# Patient Record
Sex: Female | Born: 1935 | Race: Black or African American | Hispanic: No | Smoking: Never smoker
Health system: Southern US, Community
[De-identification: ages and names within clinical notes are randomized; demographics above are authoritative.]

## PROBLEM LIST (undated history)

## (undated) DIAGNOSIS — I10 Essential (primary) hypertension: Secondary | ICD-10-CM

## (undated) DIAGNOSIS — M1990 Unspecified osteoarthritis, unspecified site: Secondary | ICD-10-CM

## (undated) DIAGNOSIS — E119 Type 2 diabetes mellitus without complications: Secondary | ICD-10-CM

## (undated) DIAGNOSIS — E876 Hypokalemia: Secondary | ICD-10-CM

## (undated) HISTORY — PX: ABDOMINAL HYSTERECTOMY: SHX81

## (undated) HISTORY — PX: COLONOSCOPY: SHX174

## (undated) SURGERY — EXCISION, CHALAZION
Anesthesia: LOCAL | Laterality: Left

---

## 2011-11-06 ENCOUNTER — Encounter (HOSPITAL_BASED_OUTPATIENT_CLINIC_OR_DEPARTMENT_OTHER)
Admission: RE | Disposition: A | Discharge status: Home / Self Care | Payer: Self-pay | Source: Ambulatory Visit | Attending: Ophthalmology

## 2011-11-06 ENCOUNTER — Ambulatory Visit (HOSPITAL_BASED_OUTPATIENT_CLINIC_OR_DEPARTMENT_OTHER)
Admission: RE | Admit: 2011-11-06 | Discharge: 2011-11-06 | Disposition: A | Discharge status: Home / Self Care | Payer: Medicare Other | Source: Ambulatory Visit | Attending: Ophthalmology | Admitting: Ophthalmology

## 2011-11-06 ENCOUNTER — Encounter (HOSPITAL_BASED_OUTPATIENT_CLINIC_OR_DEPARTMENT_OTHER): Payer: Self-pay

## 2011-11-06 ENCOUNTER — Other Ambulatory Visit: Payer: Self-pay | Admitting: Ophthalmology

## 2011-11-06 DIAGNOSIS — H0019 Chalazion unspecified eye, unspecified eyelid: Secondary | ICD-10-CM | POA: Insufficient documentation

## 2011-11-06 HISTORY — PX: CHALAZION EXCISION: SHX213

## 2011-11-06 SURGERY — MINOR EXCISION OF CHALAZION
Anesthesia: LOCAL | Site: Eye | Laterality: Left | Wound class: Clean

## 2011-11-06 SURGERY — EXCISION, CHALAZION
Anesthesia: LOCAL | Laterality: Left

## 2011-11-06 MED ORDER — LIDOCAINE HCL 1 % IJ SOLN
INTRAMUSCULAR | Status: DC | PRN
  Administered 2011-11-06: 5 mL

## 2011-11-06 MED ORDER — BACITRACIN-POLYMYXIN B 500-10000 UNIT/GM OP OINT
TOPICAL_OINTMENT | OPHTHALMIC | Status: DC | PRN
  Administered 2011-11-06: 1 via OPHTHALMIC

## 2011-11-06 MED ORDER — BALANCED SALT IO SOLN
INTRAOCULAR | Status: DC | PRN
  Administered 2011-11-06: 5 mL via INTRAOCULAR

## 2011-11-06 SURGICAL SUPPLY — 16 items
APPLICATOR COTTON TIP 6IN STRL (MISCELLANEOUS) ×2 IMPLANT
BLADE SURG 11 STRL SS (BLADE) ×2 IMPLANT
CAUTERY EYE LOW TEMP 1300F FIN (OPHTHALMIC RELATED) IMPLANT
CLOTH BEACON ORANGE TIMEOUT ST (SAFETY) IMPLANT
GAUZE SPONGE 4X4 12PLY STRL LF (GAUZE/BANDAGES/DRESSINGS) ×4 IMPLANT
GLOVE ECLIPSE 6.5 STRL STRAW (GLOVE) ×2 IMPLANT
GLOVE ECLIPSE 7.0 STRL STRAW (GLOVE) ×2 IMPLANT
MARKER SKIN DUAL TIP RULER LAB (MISCELLANEOUS) ×2 IMPLANT
NEEDLE HYPO 25X5/8 SAFETYGLIDE (NEEDLE) ×2 IMPLANT
NEEDLE HYPO 30X.5 LL (NEEDLE) ×2 IMPLANT
PAD ALCOHOL SWAB (MISCELLANEOUS) ×2 IMPLANT
PAD EYE OVAL STERILE LF (GAUZE/BANDAGES/DRESSINGS) ×6 IMPLANT
SUT SILK 6 0 P 1 (SUTURE) ×2 IMPLANT
SWABSTICK POVIDONE IODINE SNGL (MISCELLANEOUS) ×4 IMPLANT
SYR CONTROL 10ML LL (SYRINGE) ×2 IMPLANT
TOWEL OR 17X24 6PK STRL BLUE (TOWEL DISPOSABLE) ×2 IMPLANT

## 2011-11-06 NOTE — H&P (Signed)
  78 yo female with multiple chalazia  Upper lid left eye.  Lesion has not responded to conservative treatment over 4 months.  Admitted for chalazion excission

## 2011-11-06 NOTE — Brief Op Note (Signed)
11/06/2011  7:31 AM  PATIENT:  Alicia Vang  78 y.o. female  PRE-OPERATIVE DIAGNOSIS:  chalazion upper lid left eye  POST-OPERATIVE DIAGNOSIS:  * No post-op diagnosis entered *  PROCEDURE:  Procedure(s): MINOR EXCISION OF CHALAZION  SURGEON:  Surgeon(s): [employer].  PHYSICIAN ASSISTANT:   ASSISTANTS: none   ANESTHESIA:   local  EBL:     BLOOD ADMINISTERED:none  DRAINS: none  LOCAL MEDICATIONS USED:  XYLOCAINE CC  SPECIMEN:  No Specimen  DISPOSITION OF SPECIMEN:  N/A  COUNTS:  YES  TOURNIQUET:  * No tourniquets in log *  DICTATION: .Note written in EPIC  PLAN OF CARE: Discharge to home after PACU  PATIENT DISPOSITION:  Short Stay   Delay start of Pharmacological VTE agent (>24hrs) due to surgical blood loss or risk of bleeding:  {YES/NO/NOT APPLICABLE:20182                                        [facility]                                 Minor Surgery Room Physician's Record                                  [ x ] Minor Procedure    [  ]Yag Laser           11/06/2011   Brief History/ Finding   78 yo female for excision mass upper lid left eye Diagnosis;  Chalazion upper lid left eye  Local Anesthetic : Local  Procedure:  Chalazion Excision upper lid left eye    Specimen Removed: (Type & Number)   Disposition:  [ ]  Pathology, Routine                        [ x ] Other     [  ] Disposed of  Condition of Patient Post Procedure: [x  ]  Stable  [  ] Other     Discharge Instructions:  [ x ]  Diet , regular   [  ] Other          Activity: [ x ] No restrictions  [  ] Other          Medication: tylenol          Follow-up: 1 week          Other: none  Time:         No name on file.

## 2011-11-06 NOTE — Op Note (Signed)
Remove patch tomorrow.  Then resume drops to left eye 4 x day.  Call office for appointment 1 week. Remove patch tomorrow.  Then resume drops to left eye 4 x day.  Call office for appointment 1 week.

## 2011-11-07 ENCOUNTER — Ambulatory Visit (HOSPITAL_BASED_OUTPATIENT_CLINIC_OR_DEPARTMENT_OTHER): Admit: 2011-11-07 | Payer: Self-pay | Admitting: Ophthalmology

## 2011-11-07 ENCOUNTER — Encounter (HOSPITAL_BASED_OUTPATIENT_CLINIC_OR_DEPARTMENT_OTHER): Payer: Self-pay | Admitting: Ophthalmology

## 2011-11-07 NOTE — H&P (Signed)
78 yo female with with a know on the upper lid os x 4 months.  Seems to be getting larger.  At times painful.  Admitted for chalazion upper lid left eye

## 2012-01-03 ENCOUNTER — Ambulatory Visit (HOSPITAL_BASED_OUTPATIENT_CLINIC_OR_DEPARTMENT_OTHER): Admission: RE | Admit: 2012-01-03 | Payer: Medicare Other | Source: Ambulatory Visit | Admitting: Ophthalmology

## 2012-01-03 ENCOUNTER — Encounter (HOSPITAL_BASED_OUTPATIENT_CLINIC_OR_DEPARTMENT_OTHER): Admission: RE | Payer: Self-pay | Source: Ambulatory Visit

## 2012-01-03 SURGERY — MINOR EXCISION OF CHALAZION
Anesthesia: LOCAL | Laterality: Left

## 2012-02-02 ENCOUNTER — Encounter (HOSPITAL_BASED_OUTPATIENT_CLINIC_OR_DEPARTMENT_OTHER)
Admission: RE | Disposition: A | Discharge status: Home / Self Care | Payer: Self-pay | Source: Ambulatory Visit | Attending: Ophthalmology

## 2012-02-02 ENCOUNTER — Other Ambulatory Visit: Payer: Self-pay | Admitting: Ophthalmology

## 2012-02-02 ENCOUNTER — Ambulatory Visit (HOSPITAL_BASED_OUTPATIENT_CLINIC_OR_DEPARTMENT_OTHER)
Admission: RE | Admit: 2012-02-02 | Discharge: 2012-02-02 | Disposition: A | Discharge status: Home / Self Care | Payer: Medicare Other | Source: Ambulatory Visit | Attending: Ophthalmology | Admitting: Ophthalmology

## 2012-02-02 ENCOUNTER — Encounter: Payer: Self-pay | Admitting: Ophthalmology

## 2012-02-02 DIAGNOSIS — H0019 Chalazion unspecified eye, unspecified eyelid: Secondary | ICD-10-CM | POA: Insufficient documentation

## 2012-02-02 HISTORY — PX: CHALAZION EXCISION: SHX213

## 2012-02-02 SURGERY — MINOR EXCISION OF CHALAZION
Anesthesia: LOCAL | Site: Eye | Laterality: Left | Wound class: Clean

## 2012-02-02 MED ORDER — BACITRACIN-POLYMYXIN B 500-10000 UNIT/GM OP OINT
TOPICAL_OINTMENT | OPHTHALMIC | Status: DC | PRN
  Administered 2012-02-02: 1 via OPHTHALMIC

## 2012-02-02 MED ORDER — LIDOCAINE HCL 1 % IJ SOLN
INTRAMUSCULAR | Status: DC | PRN
  Administered 2012-02-02: 4 mL

## 2012-02-02 SURGICAL SUPPLY — 18 items
APPLICATOR COTTON TIP 6IN STRL (MISCELLANEOUS) ×2 IMPLANT
BLADE SURG 11 STRL SS (BLADE) ×2 IMPLANT
CAUTERY EYE LOW TEMP 1300F FIN (OPHTHALMIC RELATED) IMPLANT
CLOTH BEACON ORANGE TIMEOUT ST (SAFETY) ×2 IMPLANT
GAUZE SPONGE 4X4 12PLY STRL LF (GAUZE/BANDAGES/DRESSINGS) ×2 IMPLANT
GLOVE BIOGEL PI IND STRL 8 (GLOVE) ×2 IMPLANT
GLOVE BIOGEL PI INDICATOR 8 (GLOVE) ×2
GLOVE ECLIPSE 7.0 STRL STRAW (GLOVE) ×2 IMPLANT
MARKER SKIN DUAL TIP RULER LAB (MISCELLANEOUS) ×2 IMPLANT
NDL SAFETY ECLIPSE 18X1.5 (NEEDLE) ×1 IMPLANT
NEEDLE HYPO 18GX1.5 SHARP (NEEDLE) ×1
NEEDLE HYPO 25X5/8 SAFETYGLIDE (NEEDLE) ×2 IMPLANT
PAD ALCOHOL SWAB (MISCELLANEOUS) ×2 IMPLANT
PAD EYE OVAL STERILE LF (GAUZE/BANDAGES/DRESSINGS) ×6 IMPLANT
SUT SILK 6 0 P 1 (SUTURE) ×2 IMPLANT
SWABSTICK POVIDONE IODINE SNGL (MISCELLANEOUS) ×4 IMPLANT
SYR CONTROL 10ML LL (SYRINGE) ×2 IMPLANT
TOWEL OR 17X24 6PK STRL BLUE (TOWEL DISPOSABLE) ×2 IMPLANT

## 2012-02-02 NOTE — Brief Op Note (Signed)
02/02/2012  12:11 PM  PATIENT:  Alicia Vang  78 y.o. female  PRE-OPERATIVE DIAGNOSIS:  chalazion  POST-OPERATIVE DIAGNOSIS:  *S No post-op diagnosis entered *  PROCEDURE:  Procedure(s) (LRB): MINOR EXCISION OF CHALAZION (Left)  SURGEON:  Surgeon(s) and Role:    Vita Erm., MD - Primary  PHYSICIAN ASSISTANT:   ASSISTANTS: none   ANESTHESIA:   local  EBL:     BLOOD ADMINISTERED:none  DRAINS: none   LOCAL MEDICATIONS USED:  XYLOCAINE   SPECIMEN:  No Specimen  DISPOSITION OF SPECIMEN:  N/A  COUNTS:  YES  TOURNIQUET:  * No tourniquets in log *  DICTATION: .Note written in EPIC  PLAN OF CARE: Discharge to home after PACU  PATIENT DISPOSITION:  Short Stay   Delay start of Pharmacological VTE agent (>24hrs) due to surgical blood loss or risk of bleeding: not applicable

## 2012-02-02 NOTE — H&P (Signed)
  78 yo female has knot on upper  Lid left eye.  Has had similar lesion removed over the past 2 months.  Admitted today for re-excision chalazion upper lid left eye

## 2012-02-02 NOTE — Discharge Instructions (Signed)
Remove patch tomorrow.  Then resume drops to leftt eye 4 x day.  Call office for appointment 1 week. Remove patch tomorrow.  Then resume drops to leftt eye 4 x day.  Call office for appointment 1 week.

## 2012-02-05 ENCOUNTER — Encounter (HOSPITAL_BASED_OUTPATIENT_CLINIC_OR_DEPARTMENT_OTHER): Payer: Self-pay | Admitting: Ophthalmology

## 2012-03-04 NOTE — Op Note (Signed)
NAMEKORAL, THADEN NO.:  [id number]  MEDICAL RECORD NO.:  [id number]  LOCATION:                                 FACILITY:  PHYSICIAN:  Salley Scarlet., M.D.DATE OF BIRTH:  Aug 10, 1927  DATE OF PROCEDURE: DATE OF DISCHARGE:                              OPERATIVE REPORT   PREOPERATIVE DIAGNOSIS:  Chalazion, upper lid, left eye.  POSTOPERATIVE DIAGNOSIS:  Chalazion, upper lid, left eye.  OPERATION:  Chalazion excision, upper lid, left eye.  ANESTHESIA:  Local using Xylocaine 1%.  JUSTIFICATION OF PROCEDURE:  This is an 78 year old lady who had a chalazion excised from the upper lid of the right eye several weeks ago. However, there is recurrence of a lesion slightly adjacent to the previous one.  She has requested that the lesion be excised after conservative treatment was ineffective.  She is admitted at this time for chalazion excision.  PROCEDURE:  The patient was prepped and draped in usual manner.  The lid speculum was inserted under the upper and lower lid of the left eye.  A cruciate incision was made in the tarsus over the lesion.  The lesion was curetted using chalazion curette.  The sac was excised in total using sharp and blunt dissection.  Polysporin ophthalmic ointment was applied to the eye after the clamp was removed.  A pressure patch was applied.  The patient tolerated the procedure well and discharged to postanesthesia recovery room in satisfactory condition with instructions to see me in the office in 1 week.  DISCHARGE DIAGNOSIS:  Chalazion, upper lid, left eye.     Salley Scarlet., M.D.     TB/MEDQ  D:  03/01/2012  T:  03/01/2012  Job:  [id number]

## 2013-03-19 ENCOUNTER — Encounter (HOSPITAL_COMMUNITY): Payer: Self-pay | Admitting: Pharmacy Technician

## 2013-03-28 ENCOUNTER — Ambulatory Visit (HOSPITAL_BASED_OUTPATIENT_CLINIC_OR_DEPARTMENT_OTHER)
Admission: RE | Admit: 2013-03-28 | Discharge: 2013-03-28 | Disposition: A | Discharge status: Home / Self Care | Payer: Medicare Other | Source: Ambulatory Visit | Attending: Ophthalmology | Admitting: Ophthalmology

## 2013-03-28 ENCOUNTER — Encounter (HOSPITAL_BASED_OUTPATIENT_CLINIC_OR_DEPARTMENT_OTHER)
Admission: RE | Disposition: A | Discharge status: Home / Self Care | Payer: Self-pay | Source: Ambulatory Visit | Attending: Ophthalmology

## 2013-03-28 ENCOUNTER — Other Ambulatory Visit: Payer: Self-pay | Admitting: Ophthalmology

## 2013-03-28 ENCOUNTER — Encounter (HOSPITAL_COMMUNITY)
Admission: RE | Disposition: A | Discharge status: Home / Self Care | Payer: Self-pay | Source: Ambulatory Visit | Attending: Ophthalmology

## 2013-03-28 ENCOUNTER — Ambulatory Visit (HOSPITAL_COMMUNITY)
Admission: RE | Admit: 2013-03-28 | Discharge: 2013-03-28 | Disposition: A | Discharge status: Home / Self Care | Payer: Medicare Other | Source: Ambulatory Visit | Attending: Ophthalmology | Admitting: Ophthalmology

## 2013-03-28 DIAGNOSIS — E119 Type 2 diabetes mellitus without complications: Secondary | ICD-10-CM | POA: Insufficient documentation

## 2013-03-28 DIAGNOSIS — H01009 Unspecified blepharitis unspecified eye, unspecified eyelid: Secondary | ICD-10-CM | POA: Insufficient documentation

## 2013-03-28 DIAGNOSIS — H0019 Chalazion unspecified eye, unspecified eyelid: Secondary | ICD-10-CM | POA: Insufficient documentation

## 2013-03-28 DIAGNOSIS — I10 Essential (primary) hypertension: Secondary | ICD-10-CM | POA: Insufficient documentation

## 2013-03-28 DIAGNOSIS — Z79899 Other long term (current) drug therapy: Secondary | ICD-10-CM | POA: Insufficient documentation

## 2013-03-28 DIAGNOSIS — H409 Unspecified glaucoma: Secondary | ICD-10-CM | POA: Insufficient documentation

## 2013-03-28 HISTORY — PX: CHALAZION EXCISION: SHX213

## 2013-03-28 SURGERY — MINOR EXCISION OF CHALAZION
Anesthesia: LOCAL | Site: Eye | Laterality: Right | Wound class: Clean Contaminated

## 2013-03-28 SURGERY — MINOR EXCISION OF CHALAZION
Anesthesia: LOCAL | Site: Eye | Laterality: Right

## 2013-03-28 MED ORDER — LIDOCAINE HCL 1 % IJ SOLN
INTRAMUSCULAR | Status: DC | PRN
  Administered 2013-03-28: 8 mL

## 2013-03-28 MED ORDER — PHENYLEPHRINE HCL 2.5 % OP SOLN
OPHTHALMIC | Status: DC | PRN
  Administered 2013-03-28: 5 [drp] via OPHTHALMIC

## 2013-03-28 SURGICAL SUPPLY — 18 items
APPLICATOR COTTON TIP 6IN STRL (MISCELLANEOUS) ×2 IMPLANT
BLADE SURG 11 STRL SS (BLADE) ×2 IMPLANT
CAUTERY EYE LOW TEMP 1300F FIN (OPHTHALMIC RELATED) IMPLANT
CLOTH BEACON ORANGE TIMEOUT ST (SAFETY) ×2 IMPLANT
GAUZE SPONGE 4X4 12PLY STRL LF (GAUZE/BANDAGES/DRESSINGS) ×2 IMPLANT
GLOVE BIOGEL M STRL SZ7.5 (GLOVE) ×4 IMPLANT
GLOVE ECLIPSE 7.0 STRL STRAW (GLOVE) ×2 IMPLANT
MARKER SKIN DUAL TIP RULER LAB (MISCELLANEOUS) ×2 IMPLANT
NDL SAFETY ECLIPSE 18X1.5 (NEEDLE) ×1 IMPLANT
NEEDLE HYPO 18GX1.5 SHARP (NEEDLE) ×1
NEEDLE HYPO 25X5/8 SAFETYGLIDE (NEEDLE) IMPLANT
PAD ALCOHOL SWAB (MISCELLANEOUS) IMPLANT
PAD EYE OVAL STERILE LF (GAUZE/BANDAGES/DRESSINGS) ×4 IMPLANT
SPONGE GAUZE 4X4 12PLY (GAUZE/BANDAGES/DRESSINGS) ×4 IMPLANT
SUT SILK 6 0 P 1 (SUTURE) IMPLANT
SWABSTICK POVIDONE IODINE SNGL (MISCELLANEOUS) ×6 IMPLANT
SYR CONTROL 10ML LL (SYRINGE) ×4 IMPLANT
TOWEL OR 17X24 6PK STRL BLUE (TOWEL DISPOSABLE) ×16 IMPLANT

## 2013-03-28 SURGICAL SUPPLY — 1 items: PAD EYE OVAL STERILE LF (GAUZE/BANDAGES/DRESSINGS) ×16 IMPLANT

## 2013-03-28 NOTE — H&P (Signed)
  79 yo female has recurrent chalazia upper and lower lids right eye.  They have not responded to conservative treatment with antibiotics. Patient admitted for multiple chalazia excision.

## 2013-03-28 NOTE — H&P (Signed)
H & P submitted to medical records to be scanned into EHR.

## 2013-03-28 NOTE — H&P (Signed)
79 yo female with multiple recurrent chalazia upper and lower lids right eye.  Lesions have responded to the acute infections with antibiotics, but they have not completely resolved.  Admitted for multiple chalaiza excision upper and lower lids right eye.

## 2013-03-28 NOTE — Brief Op Note (Signed)
03/28/2013  8:08 AM  PATIENT:  Alicia Vang  79 y.o. female  PRE-OPERATIVE DIAGNOSIS:  chalazion right eye   POST-OPERATIVE DIAGNOSIS:  *Same *  PROCEDURE:  Procedure(s): MINOR EXCISION OF CHALAZION UPPER AND LOWER RIGHT EYE   (Right)  SURGEON:  Surgeon(s) and Role:    Vita Erm., MD - Primary  PHYSICIAN ASSISTANT:   ASSISTANTS: none   ANESTHESIA:   local  EBL:     BLOOD ADMINISTERED:none  DRAINS: none   LOCAL MEDICATIONS USED:  XYLOCAINE   SPECIMEN:  Excision  DISPOSITION OF SPECIMEN:  PATHOLOGY  COUNTS:  YES  TOURNIQUET:  * No tourniquets in log *  DICTATION: .Other Dictation: Dictation Number [id number]  PLAN OF CARE: Discharge to home after PACU  PATIENT DISPOSITION:  Short Stay   Delay start of Pharmacological VTE agent (>24hrs) due to surgical blood loss or risk of bleeding: not applicable

## 2013-03-30 NOTE — Op Note (Signed)
NAMEGARRY, Alicia Vang NO.:  [id number]  MEDICAL RECORD NO.:  [id number]  LOCATION:  [facility]                         FACILITY:  [facility]  PHYSICIAN:  Salley Scarlet., M.D.DATE OF BIRTH:  12/25/1926  DATE OF PROCEDURE:  03/29/2013 DATE OF DISCHARGE:  03/28/2013                              OPERATIVE REPORT   PREOPERATIVE DIAGNOSIS:  Multiple chalazia, upper and lower lid, right eye.  POSTOPERATIVE DIAGNOSIS:  Multiple chalazia, upper and lower lid, right eye.  OPERATION:  Excision of multiple chalazia, upper and lower lid, right eye.  PROCEDURE:  The patient was taken to operating room and was prepped and draped in usual manner.  The upper and lower lids of the right eye were infiltrated with several mL of Xylocaine.  Attention was applied first to the upper lid.  Chalazion clamp was applied over the lesion which was located centrally.  A cruciate incision was made in the tarsus over the lesion.  Multiple lesions were curetted using chalazion curette.  The sac was excised in toto using sharp and blunt dissection.  The chalazion clamp was applied over the lesion of the lower lid of the right eye.  A cruciate incision was made in the tarsus over the lesion and lesion was curetted using chalazion curette.  The sac was excised in toto using sharp dissection.  A pressure patch was applied after applying Polysporin ophthalmic ointment and the patient was discharged to the post anesthesia recovery room in satisfactory condition.  In the postop area, she continued to bleed profusely soaking 2 different pressure patches.  Essentially,we could not stop the bleeding.  The patient was taken back to the operating room, where the small artery was isolated and curetted so as to achieve hemostasis.  A pressure patch was applied after applying Polysporin ophthalmic ointment and the patient was returned again to the post anesthesia recovery room.  She remained there for an  hour or so.  She was discharged in satisfactory condition with instructions to remove the patch tomorrow, to resume her drops 4 times a day, and to see me in office in 1 week, unless she develops more bleeding at which time she is to call me.  DISCHARGE DIAGNOSIS:  Multiple chalazia, upper and lower lid, right eye.     Salley Scarlet., M.D.     TB/MEDQ  D:  03/29/2013  T:  03/29/2013  Job:  [id number]

## 2013-03-31 ENCOUNTER — Encounter (HOSPITAL_BASED_OUTPATIENT_CLINIC_OR_DEPARTMENT_OTHER): Payer: Self-pay | Admitting: Ophthalmology

## 2013-03-31 LAB — WOUND CULTURE: Culture: NO GROWTH

## 2013-05-09 ENCOUNTER — Encounter (HOSPITAL_BASED_OUTPATIENT_CLINIC_OR_DEPARTMENT_OTHER): Payer: Self-pay

## 2013-05-09 ENCOUNTER — Ambulatory Visit (HOSPITAL_BASED_OUTPATIENT_CLINIC_OR_DEPARTMENT_OTHER): Admit: 2013-05-09 | Payer: Self-pay | Admitting: Ophthalmology

## 2013-05-09 SURGERY — EXCISION, CHALAZION
Anesthesia: Choice | Laterality: Right

## 2016-02-18 DIAGNOSIS — I10 Essential (primary) hypertension: Secondary | ICD-10-CM | POA: Diagnosis present

## 2016-02-19 DIAGNOSIS — E119 Type 2 diabetes mellitus without complications: Secondary | ICD-10-CM

## 2017-07-04 ENCOUNTER — Other Ambulatory Visit: Payer: Self-pay | Admitting: Orthopedic Surgery

## 2017-08-10 ENCOUNTER — Other Ambulatory Visit: Payer: Self-pay | Admitting: Orthopedic Surgery

## 2017-08-13 ENCOUNTER — Inpatient Hospital Stay (HOSPITAL_COMMUNITY): Payer: Medicare Other | Admitting: Emergency Medicine

## 2017-08-13 ENCOUNTER — Other Ambulatory Visit (HOSPITAL_COMMUNITY): Payer: Medicare Other

## 2017-08-13 ENCOUNTER — Encounter (HOSPITAL_COMMUNITY): Payer: Self-pay

## 2017-08-13 ENCOUNTER — Ambulatory Visit (HOSPITAL_COMMUNITY)
Admission: RE | Admit: 2017-08-13 | Discharge: 2017-08-13 | Disposition: A | Discharge status: Home / Self Care | Payer: Medicare Other | Source: Ambulatory Visit | Attending: Orthopedic Surgery | Admitting: Orthopedic Surgery

## 2017-08-13 ENCOUNTER — Encounter (HOSPITAL_COMMUNITY)
Admission: RE | Admit: 2017-08-13 | Discharge: 2017-08-13 | Disposition: A | Discharge status: Home / Self Care | Payer: Medicare Other | Source: Ambulatory Visit | Attending: Orthopedic Surgery | Admitting: Orthopedic Surgery

## 2017-08-13 DIAGNOSIS — Z01818 Encounter for other preprocedural examination: Secondary | ICD-10-CM

## 2017-08-13 DIAGNOSIS — Z7984 Long term (current) use of oral hypoglycemic drugs: Secondary | ICD-10-CM | POA: Insufficient documentation

## 2017-08-13 DIAGNOSIS — I10 Essential (primary) hypertension: Secondary | ICD-10-CM | POA: Insufficient documentation

## 2017-08-13 DIAGNOSIS — Z01812 Encounter for preprocedural laboratory examination: Secondary | ICD-10-CM | POA: Insufficient documentation

## 2017-08-13 DIAGNOSIS — Z79899 Other long term (current) drug therapy: Secondary | ICD-10-CM | POA: Diagnosis not present

## 2017-08-13 DIAGNOSIS — E119 Type 2 diabetes mellitus without complications: Secondary | ICD-10-CM | POA: Insufficient documentation

## 2017-08-13 DIAGNOSIS — Z0183 Encounter for blood typing: Secondary | ICD-10-CM | POA: Insufficient documentation

## 2017-08-13 HISTORY — DX: Type 2 diabetes mellitus without complications: E11.9

## 2017-08-13 HISTORY — DX: Essential (primary) hypertension: I10

## 2017-08-13 HISTORY — DX: Unspecified osteoarthritis, unspecified site: M19.90

## 2017-08-13 LAB — URINALYSIS, ROUTINE W REFLEX MICROSCOPIC
BACTERIA UA: NONE SEEN
Bilirubin Urine: NEGATIVE
Glucose, UA: 50 mg/dL — AB
Hgb urine dipstick: NEGATIVE
Ketones, ur: NEGATIVE mg/dL
Leukocytes, UA: NEGATIVE
Nitrite: NEGATIVE
PROTEIN: 30 mg/dL — AB
SQUAMOUS EPITHELIAL / LPF: NONE SEEN
Specific Gravity, Urine: 1.016 (ref 1.005–1.030)
pH: 5 (ref 5.0–8.0)

## 2017-08-13 LAB — CBC WITH DIFFERENTIAL/PLATELET
BASOS ABS: 0 10*3/uL (ref 0.0–0.1)
Basophils Relative: 0 %
Eosinophils Absolute: 0 10*3/uL (ref 0.0–0.7)
Eosinophils Relative: 1 %
HEMATOCRIT: 39.5 % (ref 36.0–46.0)
Hemoglobin: 12.9 g/dL (ref 12.0–15.0)
LYMPHS ABS: 0.7 10*3/uL (ref 0.7–4.0)
LYMPHS PCT: 16 %
MCH: 28.6 pg (ref 26.0–34.0)
MCHC: 32.7 g/dL (ref 30.0–36.0)
MCV: 87.6 fL (ref 78.0–100.0)
MONO ABS: 0.5 10*3/uL (ref 0.1–1.0)
Monocytes Relative: 10 %
NEUTROS ABS: 3.4 10*3/uL (ref 1.7–7.7)
Neutrophils Relative %: 73 %
PLATELETS: 227 10*3/uL (ref 150–400)
RBC: 4.51 MIL/uL (ref 3.87–5.11)
RDW: 13.6 % (ref 11.5–15.5)
WBC: 4.7 10*3/uL (ref 4.0–10.5)

## 2017-08-13 LAB — BASIC METABOLIC PANEL
ANION GAP: 10 (ref 5–15)
BUN: 14 mg/dL (ref 6–20)
CHLORIDE: 99 mmol/L — AB (ref 101–111)
CO2: 29 mmol/L (ref 22–32)
Calcium: 9.7 mg/dL (ref 8.9–10.3)
Creatinine, Ser: 0.81 mg/dL (ref 0.44–1.00)
GFR calc Af Amer: >60 mL/min (ref >60)
GLUCOSE: 245 mg/dL — AB (ref 65–99)
POTASSIUM: 2.9 mmol/L — AB (ref 3.5–5.1)
Sodium: 138 mmol/L (ref 135–145)

## 2017-08-13 LAB — HEMOGLOBIN A1C
HEMOGLOBIN A1C: 7.1 % — AB (ref 4.8–5.6)
MEAN PLASMA GLUCOSE: 157.07 mg/dL

## 2017-08-13 LAB — GLUCOSE, CAPILLARY: Glucose-Capillary: 228 mg/dL — ABNORMAL HIGH (ref 65–99)

## 2017-08-13 LAB — SURGICAL PCR SCREEN
MRSA, PCR: NEGATIVE
STAPHYLOCOCCUS AUREUS: NEGATIVE

## 2017-08-13 LAB — TYPE AND SCREEN
ABO/RH(D): O POS
Antibody Screen: NEGATIVE

## 2017-08-13 LAB — APTT: APTT: 32 s (ref 24–36)

## 2017-08-13 LAB — ABO/RH: ABO/RH(D): O POS

## 2017-08-13 LAB — PROTIME-INR
INR: 1.05
PROTHROMBIN TIME: 13.6 s (ref 11.4–15.2)

## 2017-08-13 IMAGING — CR DG CHEST 2V
2 series · 2 of 2 positions shown · non-contrast
Comparison: None.

CLINICAL DATA: Preoperative exam prior to total knee replacement.

EXAM:
CHEST  2 VIEW

[w chest lat]
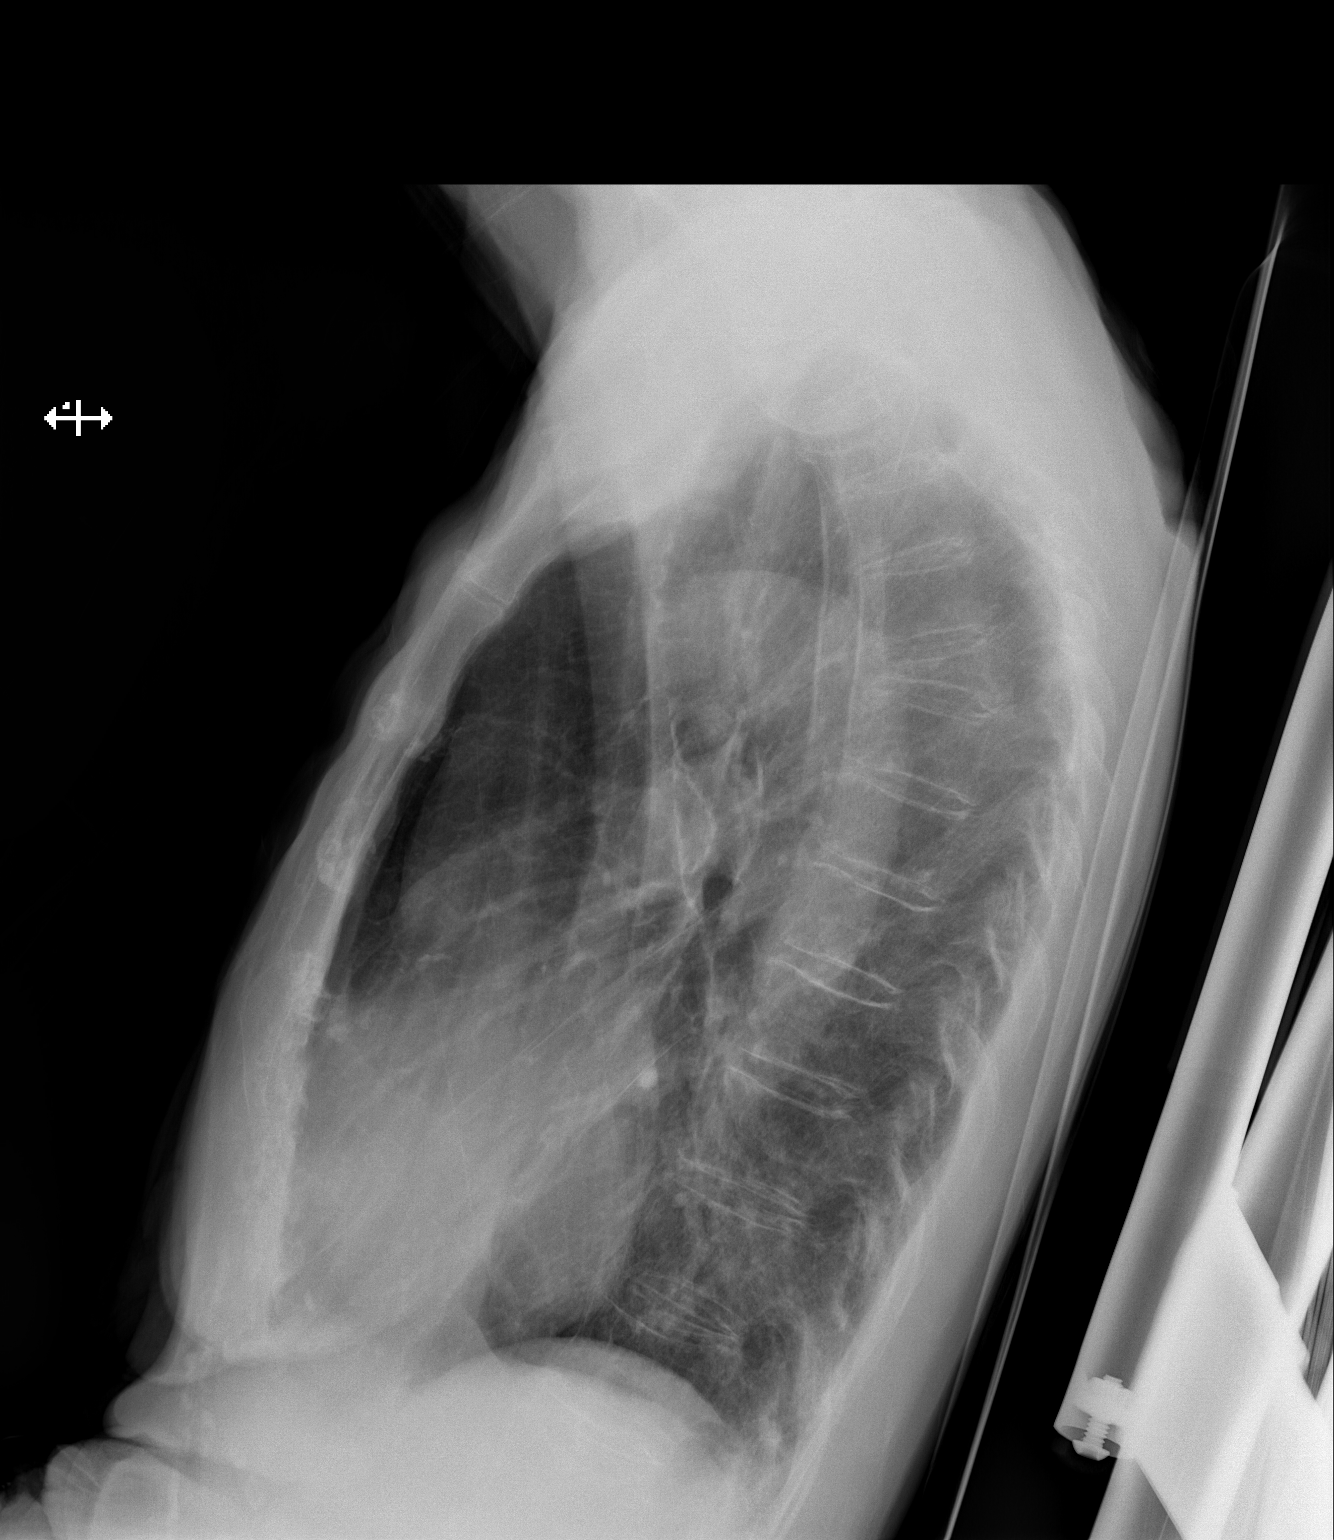

[x chest ap]
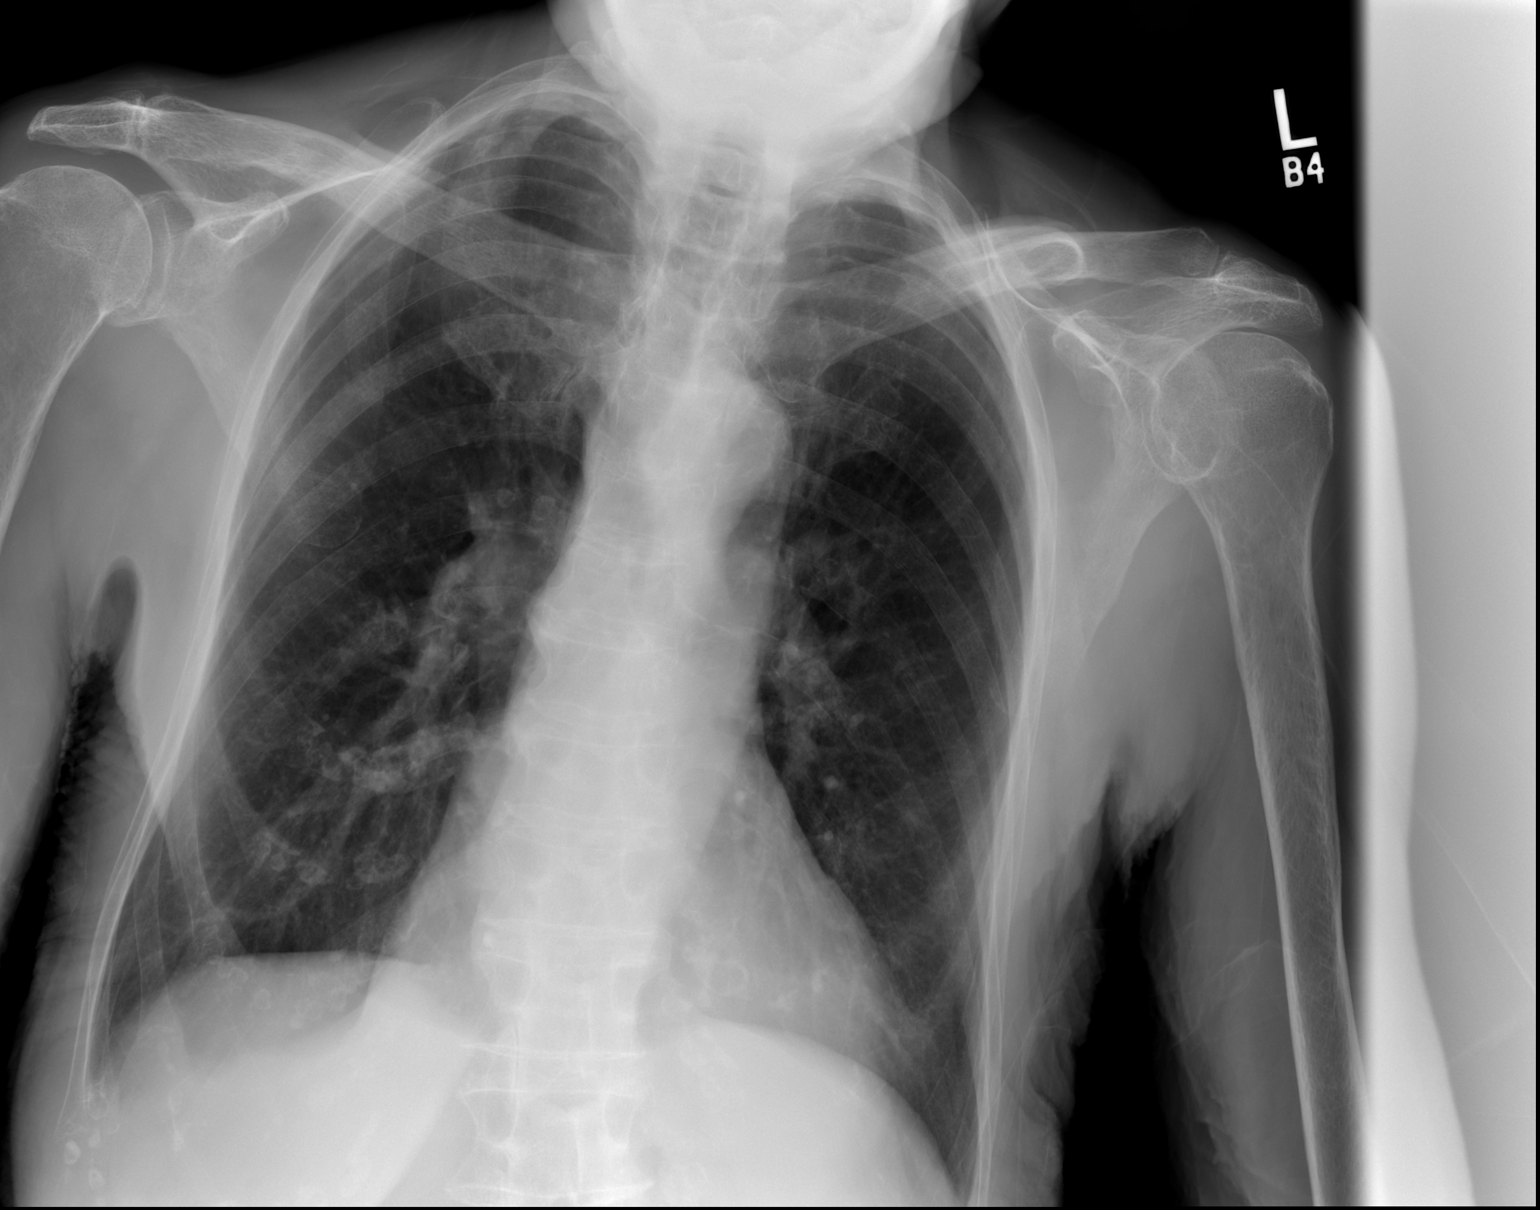

[2 of 2 positions shown; findings below may reference images not displayed]

FINDINGS: The cardiomediastinal silhouette is normal in size. Normal pulmonary
vascularity. Hyperinflated lungs. No focal consolidation, pleural
effusion, or pneumothorax. No acute osseous abnormality. Severe
compression deformity of a midthoracic vertebral body, likely
chronic.
IMPRESSION: Mild hyperinflation, which could reflect COPD. No active
cardiopulmonary disease.

## 2017-08-13 NOTE — Pre-Procedure Instructions (Signed)
Alicia Vang  08/13/2017      [facility] - [place], Alic[place] - [address] [facility] [address] [address] Alicia Vang[address] [place] [id number] Phone: [phone] Fax: [phone]    Your procedure is scheduled on Monday October 29.  [facility] at 8:00 A.M.  Call this number if you have problems the morning of surgery:  [phone]   Remember:  Do not eat food or drink liquids after midnight.  Take these medicines the morning of surgery with A SIP OF WATER: NONE  7 days prior to surgery STOP taking any Aspirin (unless otherwise instructed by your surgeon), celebrex (celecoxib), Aleve, Naproxen, Ibuprofen, Motrin, Advil, Goody's, BC's, all herbal medications, fish oil, and all vitamins  DO NOT TAKE glipizide (glucotrol) the day of surgery     How to Manage Your Diabetes Before and After Surgery  Why is it important to control my blood sugar before and after surgery? . Improving blood sugar levels before and after surgery helps healing and can limit problems. . A way of improving blood sugar control is eating a healthy diet by: o  Eating less sugar and carbohydrates o  Increasing activity/exercise o  Talking with your doctor about reaching your blood sugar goals . High blood sugars (greater than 180 mg/dL) can raise your risk of infections and slow your recovery, so you will need to focus on controlling your diabetes during the weeks before surgery. . Make sure that the doctor who takes care of your diabetes knows about your planned surgery including the date and location.  How do I manage my blood sugar before surgery? . Check your blood sugar at least 4 times a day, starting 2 days before surgery, to make sure that the level is not too high or low. o Check your blood sugar the morning of your surgery when you wake up and every 2 hours until you get to the Short Stay unit. . If your blood sugar is less than 70 mg/dL, you will need to treat for low  blood sugar: o Do not take insulin. o Treat a low blood sugar (less than 70 mg/dL) with  cup of clear juice (cranberry or apple), 4 glucose tablets, OR glucose gel. o Recheck blood sugar in 15 minutes after treatment (to make sure it is greater than 70 mg/dL). If your blood sugar is not greater than 70 mg/dL on recheck, call [phone] for further instructions. . Report your blood sugar to the short stay nurse when you get to Short Stay.  . If you are admitted to the hospital after surgery: o Your blood sugar will be checked by the staff and you will probably be given insulin after surgery (instead of oral diabetes medicines) to make sure you have good blood sugar levels. o The goal for blood sugar control after surgery is 80-180 mg/dL.                Do not wear jewelry, make-up or nail polish.  Do not wear lotions, powders, or perfumes, or deoderant.  Do not shave 48 hours prior to surgery.  Men may shave face and neck.  Do not bring valuables to the hospital.  [facility] is not responsible for any belongings or valuables.  Contacts, dentures or bridgework may not be worn into surgery.  Leave your suitcase in the car.  After surgery it may be brought to your room.  For patients admitted to the hospital, discharge time will be  determined by your treatment team.  Patients discharged the day of surgery will not be allowed to drive home.    Special instructions:    Alicia Vang- Preparing For Surgery  Before surgery, you can play an important role. Because skin is not sterile, your skin needs to be as free of germs as possible. You can reduce the number of germs on your skin by washing with CHG (chlorahexidine gluconate) Soap before surgery.  CHG is an antiseptic cleaner which kills germs and bonds with the skin to continue killing germs even after washing.  Please do not use if you have an allergy to CHG or antibacterial soaps. If your skin becomes reddened/irritated stop  using the CHG.  Do not shave (including legs and underarms) for at least 48 hours prior to first CHG shower. It is OK to shave your face.  Please follow these instructions carefully.   1. Shower the NIGHT BEFORE SURGERY and the MORNING OF SURGERY with CHG.   2. If you chose to wash your hair, wash your hair first as usual with your normal shampoo.  3. After you shampoo, rinse your hair and body thoroughly to remove the shampoo.  4. Use CHG as you would any other liquid soap. You can apply CHG directly to the skin and wash gently with a scrungie or a clean washcloth.   5. Apply the CHG Soap to your body ONLY FROM THE NECK DOWN.  Do not use on open wounds or open sores. Avoid contact with your eyes, ears, mouth and genitals (private parts). Wash Face and genitals (private parts)  with your normal soap.  6. Wash thoroughly, paying special attention to the area where your surgery will be performed.  7. Thoroughly rinse your body with warm water from the neck down.  8. DO NOT shower/wash with your normal soap after using and rinsing off the CHG Soap.  9. Pat yourself dry with a CLEAN TOWEL.  10. Wear CLEAN PAJAMAS to bed the night before surgery, wear comfortable clothes the morning of surgery  11. Place CLEAN SHEETS on your bed the night of your first shower and DO NOT SLEEP WITH PETS.    Day of Surgery: Do not apply any deodorants/lotions. Please wear clean clothes to the hospital/surgery center.      Please read over the following fact sheets that you were given. Coughing and Deep Breathing, Total Joint Packet and MRSA Information

## 2017-08-13 NOTE — Progress Notes (Addendum)
PCP - Alicia EaglesLeonardo Vang, last office note, any recent lab work and EKG tracing requested from PCP.  Cardiologist - denies  Chest x-ray - 08/13/2017  EKG - 08/13/2017 , [facility] as well for comparison.  Pt denies cardiac workup or cardiac history.   Pt checks CBG and states it is usually 80-100. Pt states she ate crackers today prior to her pre-op appointment. CBG 228 today. Hgb A1c 7.1 today.   Pt BP elevated today 181/89, 213/89 re-checked after appointment, 190/90 manual. Pt states she is not having chest pain, headache or dizziness. Per daughter pt BP elevated when at MD and patient is nervous. Pt states she thinks she took her medicine this morning, but may have missed one of her blood sugar pills. Pt states she was taking amlodipine but this was discontinued d/t leg swelling, and she only takes one medicine (Hyzaar) currently. Daughter states her mother's BP does usually run "high" but unable to give me a number. Patient and daughter educated to call PCP regarding elevated blood pressure to hopefully control this better prior to surgery. Educated daughter to take patient to pharmacy to check BP this week as well, if unable to check at home. If patient experiences any symptoms of chest pain or dizziness to seek medical attention.  Alicia Vang with anesthesia notified of elevated blood pressure and EKG from today.    Patient denies shortness of breath, fever, cough and chest pain at PAT appointment  Patient verbalized understanding of instructions that were given to them at the PAT appointment. Patient was also instructed that they will need to review over the PAT instructions again at home before surgery.

## 2017-08-15 NOTE — Progress Notes (Signed)
Anesthesia Chart Review:  Pt is an 83 year old female scheduled for L total hip arthroplasty anterior approach on 08/20/2017 with Gean BirchwoodFrank Rowan, MD  - PCP is Jaynee EaglesLeonardo Z[place]  PMH includes:  HTN, DM. Never smoker. BMI 18  Medications include: glimepiride, losartan-hctz.   Vital Signs:  BP (!) 190/90   Pulse 89   Temp 36.8 C   Resp 18   Ht 5\' 5"  (1.651 m)   Wt 107 lb 8 oz (48.8 kg)   SpO2 96%   BMI 17.89 kg/m    - BP at pre-admission testing 08/13/17: 181/89, 213/89, 190/90. Pt reportedly took her BP med on this day.  - [facility] 08[facility]14/17: 190/88, 217/95  Preoperative labs reviewed.   - HbA1c 7.1, glucose 245 - K 2.9. Pt evaluated in ED 08/14/17 and prescribed KCl (notes in care everywhere)   CXR 08/13/17: Mild hyperinflation, which could reflect COPD. No active cardiopulmonary disease.  EKG 08/13/17: Sinus rhythm with Premature supraventricular complexes and with occasional PVCs. LAFB. Nonspecific ST and T wave abnormality  I notified Olegario MessierKathy in Dr. Wadie Lessenowan's office of pt's uncontrolled BP.    Will recheck K day of surgery.  If labs and BP acceptable day of surgery, I anticipate pt can proceed as scheduled.   Rica Mastngela Dessiree Sze, FNP-BC [facility] Phone: [phone] 08/15/2017 4:11 PM

## 2017-08-17 DIAGNOSIS — M1612 Unilateral primary osteoarthritis, left hip: Secondary | ICD-10-CM | POA: Diagnosis present

## 2017-08-17 MED ORDER — CEFAZOLIN SODIUM-DEXTROSE 2-4 GM/100ML-% IV SOLN: 2.0000 g | INTRAVENOUS | Status: DC

## 2017-08-17 MED ORDER — TRANEXAMIC ACID 1000 MG/10ML IV SOLN
2000.0000 mg | INTRAVENOUS | Status: DC
  Filled 2017-08-17: qty 20

## 2017-08-17 MED ORDER — LACTATED RINGERS IV SOLN: INTRAVENOUS | Status: DC

## 2017-08-17 MED ORDER — BUPIVACAINE LIPOSOME 1.3 % IJ SUSP
20.0000 mL | INTRAMUSCULAR | Status: DC
  Filled 2017-08-17: qty 20

## 2017-08-17 MED ORDER — TRANEXAMIC ACID 1000 MG/10ML IV SOLN
1000.0000 mg | INTRAVENOUS | Status: DC
  Filled 2017-08-17: qty 10

## 2017-08-17 NOTE — H&P (Signed)
TOTAL HIP ADMISSION H&P  Patient is admitted for left total hip arthroplasty.  Subjective:  Chief Complaint: left hip pain  HPI: Alicia Vang, 83 y.o. female, has a history of pain and functional disability in the left hip(s) due to arthritis and patient has failed non-surgical conservative treatments for greater than 12 weeks to include corticosteriod injections, use of assistive devices and activity modification.  Onset of symptoms was gradual starting several years ago with gradually worsening course since that time.The patient noted no past surgery on the left hip(s).  Patient currently rates pain in the left hip at 10 out of 10 with activity. Patient has night pain, worsening of pain with activity and weight bearing, trendelenberg gait, pain that interfers with activities of daily living and pain with passive range of motion. Patient has evidence of subchondral cysts and joint space narrowing by imaging studies. This condition presents safety issues increasing the risk of falls.   There is no current active infection.  There are no active problems to display for this patient.  Past Medical History:  Diagnosis Date  . Arthritis   . Diabetes mellitus without complication (HCC)    Type II  . Hypertension     Past Surgical History:  Procedure Laterality Date  . ABDOMINAL HYSTERECTOMY    . CHALAZION EXCISION  11/06/2011   Procedure: MINOR EXCISION OF CHALAZION;  Surgeon: Vita Erm.;  Location: [facility];  Service: Ophthalmology;  Laterality: Left;  . CHALAZION EXCISION  02/02/2012   Procedure: MINOR EXCISION OF CHALAZION;  Surgeon: Vita Erm., MD;  Location: [facility];  Service: Ophthalmology;  Laterality: Left;  left eye upper lid  . CHALAZION EXCISION Right 03/28/2013   Procedure: MINOR EXCISION OF CHALAZION upper and lower right eye ;  Surgeon: Vita Erm., MD;  Location: [facility];  Service:  Ophthalmology;  Laterality: Right;  . CHALAZION EXCISION Right 03/28/2013   Procedure: MINOR EXCISION OF CHALAZION UPPER AND LOWER RIGHT EYE  ;  Surgeon: Vita Erm., MD;  Location: [facility] OR;  Service: Ophthalmology;  Laterality: Right;  . COLONOSCOPY      No current facility-administered medications for this encounter.    Current Outpatient Prescriptions  Medication Sig Dispense Refill Last Dose  . calcium-vitamin D (OSCAL WITH D) 500-200 MG-UNIT per tablet Take 1 tablet by mouth 2 (two) times daily.   03/27/2013 at Unknown  . Cholecalciferol (VITAMIN D3 PO) Take 1 tablet by mouth daily.     Marland Kitchen CINNAMON PO Take 1 tablet by mouth daily.     Marland Kitchen glimepiride (AMARYL) 2 MG tablet Take 2 mg by mouth daily with breakfast.     . hydroxypropyl methylcellulose / hypromellose (ISOPTO TEARS / GONIOVISC) 2.5 % ophthalmic solution Place 1 drop into both eyes daily.     Marland Kitchen latanoprost (XALATAN) 0.005 % ophthalmic solution Place 1 drop into both eyes at bedtime.     Marland Kitchen losartan-hydrochlorothiazide (HYZAAR) 100-12.5 MG tablet Take 1 tablet by mouth daily.     . Multiple Vitamins-Minerals (PRESERVISION AREDS 2 PO) Take by mouth daily.      No Known Allergies  Social History  Substance Use Topics  . Smoking status: Never Smoker  . Smokeless tobacco: Never Used  . Alcohol use No    No family history on file.   Review of Systems  Constitutional: Negative.   HENT: Negative.   Eyes: Negative.   Respiratory: Negative.   Cardiovascular:  Positive for leg swelling.       HTN  Gastrointestinal: Negative.   Genitourinary: Negative.   Musculoskeletal: Positive for joint pain.  Skin: Negative.   Neurological: Negative.   Endo/Heme/Allergies: Negative.   Psychiatric/Behavioral: Negative.     Objective:  Physical Exam  Constitutional: She is oriented to person, place, and time. She appears well-developed and well-nourished.  HENT:  Head: Normocephalic and atraumatic.  Eyes: Pupils are equal, round,  and reactive to light.  Neck: Normal range of motion. Neck supple.  Cardiovascular: Intact distal pulses.   Respiratory: Effort normal.  Musculoskeletal: She exhibits tenderness.  Any attempts at moving the left hip causes significant pain.  The patient is seen in a wheelchair.  Skin is intact she is neurovascular intact distally.  Abductors and flexors are intact.  Neurological: She is alert and oriented to person, place, and time.  Skin: Skin is warm and dry.  Psychiatric: She has a normal mood and affect. Her behavior is normal. Judgment and thought content normal.    Vital signs in last 24 hours:    Labs:   Estimated body mass index is 17.89 kg/m as calculated from the following:   Height as of 08/13/17: 5\' 5"  (1.651 m).   Weight as of 08/13/17: 48.8 kg (107 lb 8 oz).   Imaging Review  AP pelvis and crosstable lateral show erosive bone-on-bone arthritis of the left hip with subchondral cyst of the femoral and acetabular sides lateral subluxation of the femoral head about 8-9 mm.  Assessment/Plan:  End stage arthritis, left hip(s)  The patient history, physical examination, clinical judgement of the provider and imaging studies are consistent with end stage degenerative joint disease of the left hip(s) and total hip arthroplasty is deemed medically necessary. The treatment options including medical management, injection therapy, arthroscopy and arthroplasty were discussed at length. The risks and benefits of total hip arthroplasty were presented and reviewed. The risks due to aseptic loosening, infection, stiffness, dislocation/subluxation,  thromboembolic complications and other imponderables were discussed.  The patient acknowledged the explanation, agreed to proceed with the plan and consent was signed. Patient is being admitted for inpatient treatment for surgery, pain control, PT, OT, prophylactic antibiotics, VTE prophylaxis, progressive ambulation and ADL's and discharge  planning.The patient is planning to be discharged home with home health services

## 2017-08-20 ENCOUNTER — Encounter (HOSPITAL_COMMUNITY): Payer: Self-pay

## 2017-08-20 ENCOUNTER — Emergency Department (HOSPITAL_COMMUNITY)
Admission: EM | Admit: 2017-08-20 | Discharge: 2017-08-20 | Disposition: A | Discharge status: Home / Self Care | Payer: Medicare Other | Attending: Emergency Medicine | Admitting: Emergency Medicine

## 2017-08-20 ENCOUNTER — Ambulatory Visit (HOSPITAL_COMMUNITY)
Admission: RE | Admit: 2017-08-20 | Discharge: 2017-08-20 | Disposition: A | Discharge status: Other Acute Inpatient Hospital | Payer: Medicare Other | Source: Ambulatory Visit | Attending: Orthopedic Surgery | Admitting: Orthopedic Surgery

## 2017-08-20 ENCOUNTER — Encounter (HOSPITAL_COMMUNITY): Payer: Self-pay | Admitting: Emergency Medicine

## 2017-08-20 ENCOUNTER — Encounter (HOSPITAL_COMMUNITY)
Admission: RE | Disposition: A | Discharge status: Other Acute Inpatient Hospital | Payer: Self-pay | Source: Ambulatory Visit | Attending: Orthopedic Surgery

## 2017-08-20 DIAGNOSIS — E119 Type 2 diabetes mellitus without complications: Secondary | ICD-10-CM | POA: Insufficient documentation

## 2017-08-20 DIAGNOSIS — Z9889 Other specified postprocedural states: Secondary | ICD-10-CM | POA: Diagnosis not present

## 2017-08-20 DIAGNOSIS — M1612 Unilateral primary osteoarthritis, left hip: Secondary | ICD-10-CM | POA: Diagnosis present

## 2017-08-20 DIAGNOSIS — Z01812 Encounter for preprocedural laboratory examination: Secondary | ICD-10-CM | POA: Diagnosis present

## 2017-08-20 DIAGNOSIS — Z7984 Long term (current) use of oral hypoglycemic drugs: Secondary | ICD-10-CM | POA: Diagnosis not present

## 2017-08-20 DIAGNOSIS — I10 Essential (primary) hypertension: Secondary | ICD-10-CM | POA: Diagnosis present

## 2017-08-20 DIAGNOSIS — Z90710 Acquired absence of both cervix and uterus: Secondary | ICD-10-CM | POA: Diagnosis not present

## 2017-08-20 DIAGNOSIS — Z79899 Other long term (current) drug therapy: Secondary | ICD-10-CM | POA: Diagnosis not present

## 2017-08-20 DIAGNOSIS — Z5309 Procedure and treatment not carried out because of other contraindication: Secondary | ICD-10-CM | POA: Insufficient documentation

## 2017-08-20 DIAGNOSIS — M25552 Pain in left hip: Secondary | ICD-10-CM | POA: Diagnosis not present

## 2017-08-20 DIAGNOSIS — M13852 Other specified arthritis, left hip: Secondary | ICD-10-CM | POA: Diagnosis not present

## 2017-08-20 HISTORY — DX: Hypokalemia: E87.6

## 2017-08-20 LAB — POCT I-STAT 4, (NA,K, GLUC, HGB,HCT)
GLUCOSE: 236 mg/dL — AB (ref 65–99)
HEMATOCRIT: 37 % (ref 36.0–46.0)
Hemoglobin: 12.6 g/dL (ref 12.0–15.0)
Potassium: 3.6 mmol/L (ref 3.5–5.1)
Sodium: 140 mmol/L (ref 135–145)

## 2017-08-20 LAB — GLUCOSE, CAPILLARY: GLUCOSE-CAPILLARY: 221 mg/dL — AB (ref 65–99)

## 2017-08-20 SURGERY — ARTHROPLASTY, HIP, TOTAL, ANTERIOR APPROACH
Anesthesia: Choice | Site: Knee | Laterality: Left

## 2017-08-20 MED ORDER — HYDRALAZINE HCL 20 MG/ML IJ SOLN
10.0000 mg | Freq: Once | INTRAMUSCULAR | Status: AC
  Administered 2017-08-20: 10 mg via INTRAVENOUS
  Filled 2017-08-20: qty 1

## 2017-08-20 MED ORDER — HYDRALAZINE HCL 20 MG/ML IJ SOLN: 20.0000 mg | Freq: Once | INTRAMUSCULAR | Status: DC

## 2017-08-20 MED ORDER — MIDAZOLAM HCL 2 MG/2ML IJ SOLN
INTRAMUSCULAR | Status: AC
  Filled 2017-08-20: qty 2

## 2017-08-20 MED ORDER — HYDROCHLOROTHIAZIDE 25 MG PO TABS: 25.0000 mg | ORAL_TABLET | Freq: Every day | ORAL | 0 refills | Status: DC

## 2017-08-20 MED ORDER — PROPOFOL 10 MG/ML IV BOLUS
INTRAVENOUS | Status: AC
  Filled 2017-08-20: qty 20

## 2017-08-20 MED ORDER — FENTANYL CITRATE (PF) 250 MCG/5ML IJ SOLN
INTRAMUSCULAR | Status: AC
  Filled 2017-08-20: qty 5

## 2017-08-20 MED ORDER — CHLORHEXIDINE GLUCONATE 4 % EX LIQD: 60.0000 mL | Freq: Once | CUTANEOUS | Status: DC

## 2017-08-20 NOTE — ED Provider Notes (Signed)
[facility] EMERGENCY DEPARTMENT Provider Note  CSN: [id number] Arrival date & time: 08/20/17  1032 History   Chief Complaint Chief Complaint  Patient presents with  . Hypertension   HPI Alicia Vang is a 83 y.o. female with PMH of HTN and poor compliance who presented with high blood pressure.  The patient was scheduled for a replacement early this morning however during her preop assessment was noted to have markedly elevated blood pressures no 240 systolic and was told that she cannot have surgery with such high blood pressure and that she should come to the emergency department for further evaluation.  At this time the patient is completely asymptomatic without any complaints.  She denies any associated chest pain or shortness of breath.  She has not had any abdominal pain.  Denies any nausea vomiting or diarrhea.  Does not have any associated lightheadedness or dizziness.  Denies any headaches.  She has not had any recent fevers or chills.  States that she has otherwise been in her normal state of health.  Patient states that she is supposed to be on amlodipine however stopped taking it because it made her feet swell.  She was recently started on metoprolol approximately 5 days ago with blood pressure control however it does not appear to be adequate.  HPI  Past Medical History:  Diagnosis Date  . Arthritis   . Diabetes mellitus without complication (HCC)    Type II  . Hypertension   . Hypokalemia     Patient Active Problem List   Diagnosis Date Noted  . Osteoarthritis of left hip 08/17/2017    Past Surgical History:  Procedure Laterality Date  . ABDOMINAL HYSTERECTOMY    . CHALAZION EXCISION  11/06/2011   Procedure: MINOR EXCISION OF CHALAZION;  Surgeon: Vita Erm.;  Location: [facility];  Service: Ophthalmology;  Laterality: Left;  . CHALAZION EXCISION  02/02/2012   Procedure: MINOR EXCISION OF CHALAZION;  Surgeon: Vita Erm., MD;  Location: [facility];  Service: Ophthalmology;  Laterality: Left;  left eye upper lid  . CHALAZION EXCISION Right 03/28/2013   Procedure: MINOR EXCISION OF CHALAZION upper and lower right eye ;  Surgeon: Vita Erm., MD;  Location: [facility];  Service: Ophthalmology;  Laterality: Right;  . CHALAZION EXCISION Right 03/28/2013   Procedure: MINOR EXCISION OF CHALAZION UPPER AND LOWER RIGHT EYE  ;  Surgeon: Vita Erm., MD;  Location: [facility] OR;  Service: Ophthalmology;  Laterality: Right;  . COLONOSCOPY     OB History    No data available     Home Medications    Prior to Admission medications   Medication Sig Start Date End Date Taking? Authorizing Provider  Cholecalciferol (VITAMIN D PO) Take 10,000 capsules by mouth daily.   Yes [provider]  CINNAMON PO Take 1 tablet by mouth daily.   Yes [provider]  COMBIGAN 0.2-0.5 % ophthalmic solution Place 1 drop into both eyes 2 (two) times daily. 08/06/17  Yes [provider]  cycloSPORINE (RESTASIS) 0.05 % ophthalmic emulsion Place 1 drop into both eyes 2 (two) times daily.   Yes [provider]  glimepiride (AMARYL) 2 MG tablet Take 2 mg by mouth daily with breakfast.   Yes [provider]  hydroxypropyl methylcellulose / hypromellose (ISOPTO TEARS / GONIOVISC) 2.5 % ophthalmic solution Place 1 drop into both eyes daily.   Yes [provider]  latanoprost (XALATAN) 0.005 % ophthalmic solution Place 1 drop into both eyes at bedtime.   Yes [provider]  losartan-hydrochlorothiazide (HYZAAR) 100-12.5 MG tablet Take 1 tablet by mouth daily.   Yes [provider]  Metoprolol Succinate 25 MG CS24 Take 25 mg by mouth daily.   Yes [provider]  potassium chloride (K-DUR) 10 MEQ tablet Take 10 mEq by mouth 2 (two) times daily.   Yes [provider]  hydrochlorothiazide (HYDRODIURIL) 25 MG  tablet Take 1 tablet (25 mg total) by mouth daily. 08/20/17   Lamont SnowballGarza, Brynlea Spindler, MD  Multiple Vitamins-Minerals (PRESERVISION AREDS 2 PO) Take by mouth daily.    [provider]   Family History No family history on file.  Social History Social History  Substance Use Topics  . Smoking status: Never Smoker  . Smokeless tobacco: Never Used  . Alcohol use No   Allergies   Patient has no known allergies.   Review of Systems Review of Systems  Constitutional: Negative for chills and fever.  HENT: Negative for ear pain and sore throat.   Eyes: Negative for pain and visual disturbance.  Respiratory: Negative for cough and shortness of breath.   Cardiovascular: Negative for chest pain and palpitations.  Gastrointestinal: Negative for abdominal pain and vomiting.  Genitourinary: Negative for dysuria and hematuria.  Musculoskeletal: Negative for arthralgias and back pain.  Skin: Negative for color change and rash.  Neurological: Negative for seizures and syncope.  All other systems reviewed and are negative.  Physical Exam Updated Vital Signs BP (!) 200/73 (BP Location: Right Arm)   Pulse 65   Temp 98.8 F (37.1 C) (Oral)   Resp 18   SpO2 100%   Physical Exam  Constitutional: She is oriented to person, place, and time. She appears well-developed and well-nourished. No distress.  HENT:  Head: Normocephalic and atraumatic.  Eyes: Pupils are equal, round, and reactive to light. Conjunctivae and EOM are normal.  Neck: Normal range of motion. Neck supple.  Cardiovascular: Normal rate and regular rhythm.   No murmur heard. Pulmonary/Chest: Effort normal and breath sounds normal. No respiratory distress.  Abdominal: Soft. There is no tenderness.  Musculoskeletal: She exhibits no edema or tenderness.  Neurological: She is alert and oriented to person, place, and time.  Skin: Skin is warm and dry.  Psychiatric: She has a normal mood and affect.  Nursing note and vitals  reviewed.  ED Treatments / Results  Labs (all labs ordered are listed, but only abnormal results are displayed) Labs Reviewed - No data to display  EKG  EKG Interpretation None      Radiology No results found.  Procedures Procedures (including critical care time)  Medications Ordered in ED Medications  hydrALAZINE (APRESOLINE) injection 20 mg (not administered)  hydrALAZINE (APRESOLINE) injection 10 mg (10 mg Intravenous Given 08/20/17 1701)   Initial Impression / Assessment and Plan / ED Course  I have reviewed the triage vital signs and the nursing notes.  Pertinent labs & imaging results that were available during my care of the patient were reviewed by me and considered in my medical decision making (see chart for details).  Jannette FogoLossie S Jaquez is a 83 year old female with past medical history of hypertension who presents with high blood pressure.  Patient given dose of IV hydralazine in the emergency department with significant improvement in the blood pressure.   At this time as the patient is asymptomatic do not feel that a large ED workup is currently needed. .Marland Kitchen  I had a lengthy discussion with the patient and her daughter at bedside about blood pressure control and the importance of following up with her primary care provider to make sure her medications were adequate and appropriately controlling her blood pressure.  Furthermore the importance of her primary care provider being able to titrate her medications as needed.  Explained to the patient the importance of medication compliance and controlling her blood pressure and being able to go through with her surgery.  I advised the patient is to follow-up with the orthopedic surgeons to attempt to reschedule her surgery.  I explained to the patient that without adequate blood pressure control her surgery would continue to be delayed.  The patient and her daughter at bedside state their understanding.  Explained to the  patient that I would discharge her with a prescription for hydrochlorothiazide however and it would be important for her to see her primary complaint in the next 3-5 days for any additional changes or additions to her medication.  Final Clinical Impressions(s) / ED Diagnoses   Final diagnoses:  Hypertension, unspecified type    New Prescriptions New Prescriptions   HYDROCHLOROTHIAZIDE (HYDRODIURIL) 25 MG TABLET    Take 1 tablet (25 mg total) by mouth daily.     Lamont Snowball, MD 08/22/17 1341    Raeford Razor, MD 09/04/17 [phone]

## 2017-08-20 NOTE — ED Triage Notes (Signed)
Pt to ER sent from short stay where patient was supposed to have left hip replacement, sent to ER for hypertension. Daughter states readings were in 200's. At present time reading is 178/87. Daughter states patient has been taking her losaarten however has not been taking her amlodipine for "months."

## 2017-08-20 NOTE — ED Notes (Signed)
Pt's daughter st's pt was scheduled to have a hip replacement this am but they would not do it due to her blood pressure being elevated.  No complaints voiced by pt

## 2017-08-20 NOTE — Progress Notes (Signed)
Dr. Chaney MallingHodierne made aware of pt's lab results and BP's of 269/137, 222/84, 220/105 with a HR 98. Pt denies any symptoms at this time.

## 2017-08-20 NOTE — Progress Notes (Signed)
Report given to the ED charge nurse. Instructed to bring the pt to the Triage area.

## 2017-10-15 NOTE — Pre-Procedure Instructions (Signed)
Alicia Vang  10/15/2017      [facility] - [place], [place] - [address] [place] [address] Phone: [phone] Fax: [phone]  [id number] [place] - [address] [place] [address] Phone: [phone] Fax: [phone]    Your procedure is scheduled on Friday, January 4.  [facility] Ad[facility] at [phone] A.M.                 For any other questions, please call [phone], Monday - Friday 8 AM - 4 PM.   Call this number if you have problems the morning of surgery: [phone]- pre- op desk      For any other questions, please call [phone], Monday - Friday 8 AM - 4 PM. (except 12/25 and 1/1)   Remember:  Do not eat food or drink liquids after midnight Thursday, January 3.   Take these medicines the morning of surgery with A SIP OF WATER :  Metoprolol Succinate  May use Eye Drops   DO NOT take Glipmepiride (Amaryl) the morning of surgery  1 Week prior to surgery STOP taking Aspirin, Aspirin Products (Goody Powder, Excedrin Migraine), Ibuprofen (Advil), Naproxen (Aleve), Vitamins and Herbal Products (ie Fish Oil)   How to Manage Your Diabetes Before and After Surgery  Why is it important to control my blood sugar before and after surgery? . Improving blood sugar levels before and after surgery helps healing and can limit problems. . A way of improving blood sugar control is eating a healthy diet by: o  Eating less sugar and carbohydrates o  Increasing activity/exercise o  Talking with your doctor about reaching your blood sugar goals . High blood sugars (greater than 180 mg/dL) can raise your risk of infections and slow your recovery, so you will need to focus on controlling your diabetes during the weeks before surgery. . Make sure that the doctor who takes care of your diabetes knows about your planned surgery including the date and  location.  How do I manage my blood sugar before surgery? . Check your blood sugar at least 4 times a day, starting 2 days before surgery, to make sure that the level is not too high or low. o Check your blood sugar the morning of your surgery when you wake up and every 2 hours until you get to the Short Stay unit. . If your blood sugar is less than 70 mg/dL, you will need to treat for low blood sugar: o Do not take insulin. o Treat a low blood sugar (less than 70 mg/dL) with  cup of clear juice (cranberry or apple), 4 glucose tablets, OR glucose gel. Recheck blood sugar in 15 minutes after treatment (to make sure it is greater than 70 mg/dL). If your blood sugar is not greater than 70 mg/dL on recheck, call [phone] o  for further instructions. . Report your blood sugar to the short stay nurse when you get to Short Stay.  . If you are admitted to the hospital after surgery: o Your blood sugar will be checked by the staff and you will probably be given insulin after surgery (instead of oral diabetes medicines) to make sure you have good blood sugar levels. o The goal for blood sugar control after surgery is 80-180 mg/dL.   WHAT DO I DO ABOUT MY DIABETES MEDICATION?   Marland Kitchen. Do not take oral diabetes medicines (pills) the  morning of surgery. Marland Kitchen.  Special instructions:  [place]- Preparing For Surgery  Before surgery, you can play an important role. Because skin is not sterile, your skin needs to be as free of germs as possible. You can reduce the number of germs on your skin by washing with CHG (chlorahexidine gluconate) Soap before surgery.  CHG is an antiseptic cleaner which kills germs and bonds with the skin to continue killing germs even after washing.  Please do not use if you have an allergy to CHG or antibacterial soaps. If your skin becomes reddened/irritated stop using the CHG.  Do not shave (including legs and underarms) for at least 48 hours prior to first CHG shower. It is OK  to shave your face.  Please follow these instructions carefully.   1. Shower the NIGHT BEFORE SURGERY and the MORNING OF SURGERY with CHG.   2. If you chose to wash your hair, wash your hair first as usual with your normal shampoo.  3. After you shampoo, rinse your hair and body thoroughly to remove the shampoo.  4. Use CHG as you would any other liquid soap. You can apply CHG directly to the skin and wash gently with a scrungie or a clean washcloth.   5. Apply the CHG Soap to your body ONLY FROM THE NECK DOWN.  Do not use on open wounds or open sores. Avoid contact with your eyes, ears, mouth and genitals (private parts). Wash Face and genitals (private parts)  with your normal soap.  6. Wash thoroughly, paying special attention to the area where your surgery will be performed.  7. Thoroughly rinse your body with warm water from the neck down.  8. DO NOT shower/wash with your normal soap after using and rinsing off the CHG Soap.  9. Pat yourself dry with a CLEAN TOWEL.  10. Wear CLEAN PAJAMAS to bed the night before surgery, wear comfortable clothes the morning of surgery  11. Place CLEAN SHEETS on your bed the night of your first shower and DO NOT SLEEP WITH PETS.    Day of Surgery: shower as  Do not apply any deodorants/lotions, powders and colognes. Please wear clean clothes to the hospital/surgery center.    Do not wear jewelry, make-up or nail polish.             Do not shave 48 hours prior to surgery.  Men may shave face and neck.  Do not bring valuables to the hospital.  [facility] is not responsible for any belongings or valuables.  Contacts, dentures or bridgework may not be worn into surgery.  Leave your suitcase in the car.  After surgery it may be brought to your room.  For patients admitted to the hospital, discharge time will be determined by your treatment team.  Patients discharged the day of surgery will not be allowed to drive home.   Please read over the  following fact sheets that you were given.

## 2017-10-17 ENCOUNTER — Encounter (HOSPITAL_COMMUNITY)
Admission: RE | Admit: 2017-10-17 | Discharge: 2017-10-17 | Disposition: A | Discharge status: Home / Self Care | Payer: Medicare Other | Source: Ambulatory Visit | Attending: Orthopedic Surgery | Admitting: Orthopedic Surgery

## 2017-10-17 ENCOUNTER — Encounter (HOSPITAL_COMMUNITY): Payer: Self-pay

## 2017-10-17 ENCOUNTER — Other Ambulatory Visit: Payer: Self-pay

## 2017-10-17 DIAGNOSIS — I10 Essential (primary) hypertension: Secondary | ICD-10-CM | POA: Insufficient documentation

## 2017-10-17 DIAGNOSIS — E119 Type 2 diabetes mellitus without complications: Secondary | ICD-10-CM | POA: Diagnosis not present

## 2017-10-17 DIAGNOSIS — Z01812 Encounter for preprocedural laboratory examination: Secondary | ICD-10-CM | POA: Insufficient documentation

## 2017-10-17 LAB — URINALYSIS, ROUTINE W REFLEX MICROSCOPIC
Bilirubin Urine: NEGATIVE
Glucose, UA: 150 mg/dL — AB
Hgb urine dipstick: NEGATIVE
KETONES UR: NEGATIVE mg/dL
LEUKOCYTES UA: NEGATIVE
NITRITE: NEGATIVE
PH: 5 (ref 5.0–8.0)
PROTEIN: NEGATIVE mg/dL
Specific Gravity, Urine: 1.016 (ref 1.005–1.030)

## 2017-10-17 LAB — CBC WITH DIFFERENTIAL/PLATELET
Basophils Absolute: 0 10*3/uL (ref 0.0–0.1)
Basophils Relative: 0 %
EOS ABS: 0.1 10*3/uL (ref 0.0–0.7)
EOS PCT: 1 %
HCT: 34.6 % — ABNORMAL LOW (ref 36.0–46.0)
HEMOGLOBIN: 11.1 g/dL — AB (ref 12.0–15.0)
LYMPHS ABS: 0.9 10*3/uL (ref 0.7–4.0)
LYMPHS PCT: 17 %
MCH: 28.1 pg (ref 26.0–34.0)
MCHC: 32.1 g/dL (ref 30.0–36.0)
MCV: 87.6 fL (ref 78.0–100.0)
MONOS PCT: 6 %
Monocytes Absolute: 0.4 10*3/uL (ref 0.1–1.0)
Neutro Abs: 4.2 10*3/uL (ref 1.7–7.7)
Neutrophils Relative %: 76 %
PLATELETS: 223 10*3/uL (ref 150–400)
RBC: 3.95 MIL/uL (ref 3.87–5.11)
RDW: 13.8 % (ref 11.5–15.5)
WBC: 5.5 10*3/uL (ref 4.0–10.5)

## 2017-10-17 LAB — HEMOGLOBIN A1C
HEMOGLOBIN A1C: 8.5 % — AB (ref 4.8–5.6)
MEAN PLASMA GLUCOSE: 197.25 mg/dL

## 2017-10-17 LAB — BASIC METABOLIC PANEL
Anion gap: 12 (ref 5–15)
BUN: 26 mg/dL — AB (ref 6–20)
CHLORIDE: 96 mmol/L — AB (ref 101–111)
CO2: 27 mmol/L (ref 22–32)
CREATININE: 0.96 mg/dL (ref 0.44–1.00)
Calcium: 9.5 mg/dL (ref 8.9–10.3)
GFR calc Af Amer: 59 mL/min — ABNORMAL LOW (ref >60)
GFR calc non Af Amer: 51 mL/min — ABNORMAL LOW (ref >60)
Glucose, Bld: 269 mg/dL — ABNORMAL HIGH (ref 65–99)
Potassium: 3.2 mmol/L — ABNORMAL LOW (ref 3.5–5.1)
Sodium: 135 mmol/L (ref 135–145)

## 2017-10-17 LAB — SURGICAL PCR SCREEN
MRSA, PCR: NEGATIVE
Staphylococcus aureus: NEGATIVE

## 2017-10-17 LAB — PROTIME-INR
INR: 1.08
PROTHROMBIN TIME: 13.9 s (ref 11.4–15.2)

## 2017-10-17 LAB — GLUCOSE, CAPILLARY: Glucose-Capillary: 254 mg/dL — ABNORMAL HIGH (ref 65–99)

## 2017-10-17 LAB — APTT: aPTT: 29 seconds (ref 24–36)

## 2017-10-17 NOTE — Progress Notes (Signed)
PCP - Knox RoyaltyEnrico Jones  Chest x-ray - 08/13/17 EKG - 08/13/17  Pt denies cardiac workup or cardiac testing.   Fasting Blood Sugar - pt unable to check CBG at home d/t no lancets. CBG 254 today. Pt encouraged to watch diet until surgery and purchase lancets if possible to check CBG prior to surgery.   Pt BP elevated 170/180 systolic today at PAT. Pt asymptomatic, but states she is "excited" or anxious. Pt daughter states blood pressure is more elevated in the morning but is usually 130/70s in the evening. Pt takes all of her medicine in the morning around 11AM usually. Pt recently followed up with Dr. Knox RoyaltyEnrico Jones for blood pressure management. Records requested.   Anesthesia review: elevated BP and elevated CBG  Patient denies shortness of breath, fever, cough and chest pain at PAT appointment   Patient verbalized understanding of instructions that were given to them at the PAT appointment. Patient was also instructed that they will need to review over the PAT instructions again at home before surgery.

## 2017-10-17 NOTE — Progress Notes (Signed)
[facility] - [name], [place] - [address] [place] [address] Phone: [phone] Fax: [phone]  [id number] - Ginette Otto, [place] - [address] [place] [address] Phone: [phone] Fax: [phone]              Your procedure is scheduled on Friday, January 4.            Report to [facility] Admitting at [phone] A.M.                 For any other questions, please call [phone], Monday - Friday 8 AM - 4 PM.             Call this number if you have problems the morning of surgery: [phone]- pre- op desk                For any other questions, please call [phone], Monday - Friday 8 AM - 4 PM. (except 12/25 and 1/1)             Remember:            Do not eat food or drink liquids after midnight Thursday, January 3.             Take these medicines the morning of surgery with A SIP OF WATER :  Metoprolol Succinate  Hydralazine (apresoline) Tramadol (ultram) if needed May use Eye Drops   DO NOT take Glipmepiride (Amaryl) the morning of surgery.   1 Week prior to surgery STOP taking Aspirin, Aspirin Products (Goody Powder, Excedrin Migraine), Ibuprofen (Advil), Naproxen (Aleve), Vitamins and Herbal Products (ie Fish Oil)   HOW TO MANAGE YOUR DIABETES BEFORE AND AFTER SURGERY  Why is it important to control my blood sugar before and after surgery?  Improving blood sugar levels before and after surgery helps healing and can limit problems.  A way of improving blood sugar control is eating a healthy diet by: ?  Eating less sugar and carbohydrates ?  Increasing activity/exercise ?  Talking with your doctor about reaching your blood sugar goals  High blood sugars (greater than 180 mg/dL) can raise your risk of infections and slow your recovery, so you will need to focus on controlling your diabetes during the weeks before surgery.  Make sure that the doctor who takes  care of your diabetes knows about your planned surgery including the date and location.  How do I manage my blood sugar before surgery?  Check your blood sugar at least 4 times a day, starting 2 days before surgery, to make sure that the level is not too high or low. ? Check your blood sugar the morning of your surgery when you wake up and every 2 hours until you get to the Short Stay unit.  If your blood sugar is less than 70 mg/dL, you will need to treat for low blood sugar: ? Do not take insulin. ? Treat a low blood sugar (less than 70 mg/dL) with  cup of clear juice (cranberry or apple), 4glucose tablets, OR glucose gel. Recheck blood sugar in 15 minutes after treatment (to make sure it is greater than 70 mg/dL). If your blood sugar is not greater than 70 mg/dL on recheck, call [phone] ?  for further instructions.  Report your blood sugar to the short stay nurse when you get to Short Stay.   If you are admitted to the hospital after surgery: ? Your  blood sugar will be checked by the staff and you will probably be given insulin after surgery (instead of oral diabetes medicines) to make sure you have good blood sugar levels. ? The goal for blood sugar control after surgery is 80-180 mg/dL.   Special instructions:  [place]- Preparing For Surgery  Before surgery, you can play an important role. Because skin is not sterile, your skin needs to be as free of germs as possible. You can reduce the number of germs on your skin by washing with CHG (chlorahexidine gluconate) Soap before surgery.  CHG is an antiseptic cleaner which kills germs and bonds with the skin to continue killing germs even after washing.  Please do not use if you have an allergy to CHG or antibacterial soaps. If your skin becomes reddened/irritated stop using the CHG.  Do not shave (including legs and underarms) for at least 48 hours prior to first CHG shower. It is OK to shave your face.  Please follow  these instructions carefully.                                                                                                                     1. Shower the NIGHT BEFORE SURGERY and the MORNING OF SURGERY with CHG.   2. If you chose to wash your hair, wash your hair first as usual with your normal shampoo.  3. After you shampoo, rinse your hair and body thoroughly to remove the shampoo.  4. Use CHG as you would any other liquid soap. You can apply CHG directly to the skin and wash gently with a scrungie or a clean washcloth.   5. Apply the CHG Soap to your body ONLY FROM THE NECK DOWN.  Do not use on open wounds or open sores. Avoid contact with your eyes, ears, mouth and genitals (private parts). Wash Face and genitals (private parts)  with your normal soap.  6. Wash thoroughly, paying special attention to the area where your surgery will be performed.  7. Thoroughly rinse your body with warm water from the neck down.  8. DO NOT shower/wash with your normal soap after using and rinsing off the CHG Soap.  9. Pat yourself dry with a CLEAN TOWEL.  10. Wear CLEAN PAJAMAS to bed the night before surgery, wear comfortable clothes the morning of surgery  11. Place CLEAN SHEETS on your bed the night of your first shower and DO NOT SLEEP WITH PETS.    Day of Surgery: shower as  Do not apply any deodorants/lotions, powders and colognes. Please wear clean clothes to the hospital/surgery center.              Do not wear jewelry, make-up or nail polish.                 Do not shave 48 hours prior to surgery.  Men may shave face and neck.            Do not bring valuables to  the hospital.            [facility] not [facility] for any belongings or valuables.  Contacts, dentures or bridgework may not be worn into surgery.  Leave your suitcase in the car.  After surgery it may be brought to your room.  For patients admitted to the hospital, discharge time will be determined  by your treatment team.  Patients discharged the day of surgery will not be allowed to drive home.   Please read over the following fact sheets that you were given.

## 2017-10-17 NOTE — Progress Notes (Signed)
Anesthesia Chart Review:  Pt is an 83 year old female scheduled for L total hip arthroplasty anterior approach on 10/26/2017 with Gean BirchwoodFrank Rowan, MD  Surgery was originally scheduled for 08/20/17 but was cancelled when pt arrived to holding with a BP of 269/137.    - PCP is Knox RoyaltyEnrico Jones, MD  PMH includes:  HTN, DM. Never smoker.   Medications include: chlorthalidone, glimepiride, hydralazine, losartan, metoprolol, potassium  Vital Signs:  BP (!) 176/60 (BP Location: Left Arm)   Pulse 62   Temp 36.8 C (Oral)   Resp 18   SpO2 100%  - BP on arrival was 189/77; recheck BP was 182/68 and 176/60.  Pt reported she did take all BP medicines about 45 minutes prior to BP assessment.   Preoperative labs reviewed.   - HbA1c 8.5, glucose 269.   CXR 08/13/17: Mild hyperinflation, which could reflect COPD. No active cardiopulmonary disease.  EKG 08/13/17: Sinus rhythm with Premature supraventricular complexes and with occasional PVCs. LAFB. Nonspecific ST and T wave abnormality  [facility] Wadie Lessenowan's office about uncontrolled HTN and DM.   If BP and blood glucose acceptable day of surgery, I anticipate pt can proceed with surgery as scheduled.   Rica Mastngela Kabbe, FNP-BC [facility]esthesiology Phone: [phone] 10/17/2017 10:57 AM

## 2017-10-22 ENCOUNTER — Other Ambulatory Visit: Payer: Self-pay | Admitting: Orthopedic Surgery

## 2017-10-24 NOTE — H&P (Signed)
TOTAL HIP ADMISSION H&P  Patient is admitted for left total hip arthroplasty.  Subjective:  Chief Complaint: left hip pain  HPI: Alicia Vang, 84 y.o. female, has a history of pain and functional disability in the left hip(s) due to arthritis and patient has failed non-surgical conservative treatments for greater than 12 weeks to include NSAID's and/or analgesics, use of assistive devices and activity modification.  Onset of symptoms was gradual starting several years ago with gradually worsening course since that time.The patient noted no past surgery on the left hip(s).  Patient currently rates pain in the left hip at 10 out of 10 with activity. Patient has night pain, worsening of pain with activity and weight bearing, trendelenberg gait, pain that interfers with activities of daily living and pain with passive range of motion. Patient has evidence of subchondral cysts and joint space narrowing by imaging studies. This condition presents safety issues increasing the risk of falls.   There is no current active infection.  Patient Active Problem List   Diagnosis Date Noted  . Osteoarthritis of left hip 08/17/2017   Past Medical History:  Diagnosis Date  . Arthritis   . Diabetes mellitus without complication (HCC)    Type II  . Hypertension   . Hypokalemia     Past Surgical History:  Procedure Laterality Date  . ABDOMINAL HYSTERECTOMY    . CHALAZION EXCISION  11/06/2011   Procedure: MINOR EXCISION OF CHALAZION;  Surgeon: Vita Erm.;  Location: [facility];  Service: Ophthalmology;  Laterality: Left;  . CHALAZION EXCISION  02/02/2012   Procedure: MINOR EXCISION OF CHALAZION;  Surgeon: Vita Erm., MD;  Location: [facility];  Service: Ophthalmology;  Laterality: Left;  left eye upper lid  . CHALAZION EXCISION Right 03/28/2013   Procedure: MINOR EXCISION OF CHALAZION upper and lower right eye ;  Surgeon: Vita Erm., MD;   Location: [facility];  Service: Ophthalmology;  Laterality: Right;  . CHALAZION EXCISION Right 03/28/2013   Procedure: MINOR EXCISION OF CHALAZION UPPER AND LOWER RIGHT EYE  ;  Surgeon: Vita Erm., MD;  Location: [facility] OR;  Service: Ophthalmology;  Laterality: Right;  . COLONOSCOPY      No current facility-administered medications for this encounter.    Current Outpatient Medications  Medication Sig Dispense Refill Last Dose  . chlorthalidone (HYGROTON) 25 MG tablet Take 25 mg by mouth daily.     Marland Kitchen CINNAMON PO Take 1 tablet by mouth daily.   08/19/2017 at Unknown time  . COMBIGAN 0.2-0.5 % ophthalmic solution Place 1 drop into both eyes 2 (two) times daily.   08/19/2017 at Unknown time  . cycloSPORINE (RESTASIS) 0.05 % ophthalmic emulsion Place 1 drop into both eyes 2 (two) times daily.   08/19/2017 at Unknown time  . glimepiride (AMARYL) 2 MG tablet Take 2 mg by mouth daily with breakfast.   08/19/2017 at Unknown time  . hydrALAZINE (APRESOLINE) 10 MG tablet Take 10 mg by mouth daily.     Marland Kitchen latanoprost (XALATAN) 0.005 % ophthalmic solution Place 1 drop into both eyes at bedtime.   08/19/2017 at Unknown time  . losartan (COZAAR) 100 MG tablet Take 100 mg by mouth daily.     . Metoprolol Succinate 50 MG CS24 Take 50 mg by mouth daily.    08/20/2017 at 0830  . Multiple Vitamins-Minerals (PRESERVISION AREDS 2 PO) Take by mouth daily.   Past Week at Unknown time  .  Potassium Chloride ER 20 MEQ TBCR Take 20 mEq by mouth daily.    08/19/2017 at Unknown time  . traMADol (ULTRAM) 50 MG tablet Take 50 mg by mouth every 6 (six) hours as needed for moderate pain or severe pain.     . hydrochlorothiazide (HYDRODIURIL) 25 MG tablet Take 1 tablet (25 mg total) by mouth daily. (Patient not taking: Reported on 10/15/2017) 30 tablet 0 Not Taking at Unknown time   Allergies  Allergen Reactions  . Amlodipine     Swelling in feet    Social History   Tobacco Use  . Smoking status:  Never Smoker  . Smokeless tobacco: Never Used  Substance Use Topics  . Alcohol use: No    No family history on file.   Review of Systems  Constitutional: Negative.   HENT: Negative.   Eyes: Negative.   Respiratory: Negative.   Cardiovascular: Positive for leg swelling.       HTN  Gastrointestinal: Negative.   Genitourinary: Negative.   Musculoskeletal: Positive for joint pain.  Skin: Negative.   Neurological: Negative.   Endo/Heme/Allergies: Negative.   Psychiatric/Behavioral: Negative.     Objective:  Physical Exam  Constitutional: She is oriented to person, place, and time. She appears well-developed and well-nourished.  HENT:  Head: Normocephalic and atraumatic.  Eyes: Pupils are equal, round, and reactive to light.  Neck: Normal range of motion. Neck supple.  Cardiovascular: Intact distal pulses.  Respiratory: Effort normal.  Musculoskeletal: She exhibits tenderness.  Any attempts at moving the left hip causes significant pain.  The patient is seen in a wheelchair.  Skin is intact she is neurovascular intact distally.  Abductors and flexors are intact.  Neurological: She is alert and oriented to person, place, and time.  Skin: Skin is warm and dry.  Psychiatric: She has a normal mood and affect. Her behavior is normal. Judgment and thought content normal.    Vital signs in last 24 hours:    Labs:   Estimated body mass index is 17.89 kg/m as calculated from the following:   Height as of 08/20/17: 5\' 5"  (1.651 m).   Weight as of 08/20/17: 48.8 kg (107 lb 8 oz).   Imaging Review Plain radiographs demonstrate  AP pelvis and crosstable lateral show erosive bone-on-bone arthritis of the left hip with subchondral cyst of the femoral and acetabular sides lateral subluxation of the femoral head about 8-9 mm.  Assessment/Plan:  End stage arthritis, left hip(s)  The patient history, physical examination, clinical judgement of the provider and imaging studies are  consistent with end stage degenerative joint disease of the left hip(s) and total hip arthroplasty is deemed medically necessary. The treatment options including medical management, injection therapy, arthroscopy and arthroplasty were discussed at length. The risks and benefits of total hip arthroplasty were presented and reviewed. The risks due to aseptic loosening, infection, stiffness, dislocation/subluxation,  thromboembolic complications and other imponderables were discussed.  The patient acknowledged the explanation, agreed to proceed with the plan and consent was signed. Patient is being admitted for inpatient treatment for surgery, pain control, PT, OT, prophylactic antibiotics, VTE prophylaxis, progressive ambulation and ADL's and discharge planning.The patient is planning to be discharged home with home health services.

## 2017-10-25 MED ORDER — LACTATED RINGERS IV SOLN: INTRAVENOUS | Status: DC

## 2017-10-25 MED ORDER — SODIUM CHLORIDE 0.9 % IV SOLN
1000.0000 mg | INTRAVENOUS | Status: DC
  Filled 2017-10-25: qty 10

## 2017-10-25 MED ORDER — CEFAZOLIN SODIUM-DEXTROSE 2-4 GM/100ML-% IV SOLN
2.0000 g | INTRAVENOUS | Status: AC
  Administered 2017-10-26: 2 g via INTRAVENOUS
  Filled 2017-10-25: qty 100

## 2017-10-25 MED ORDER — TRANEXAMIC ACID 1000 MG/10ML IV SOLN
2000.0000 mg | INTRAVENOUS | Status: AC
  Administered 2017-10-26: 2000 mg via TOPICAL
  Filled 2017-10-25: qty 20

## 2017-10-25 MED ORDER — BUPIVACAINE LIPOSOME 1.3 % IJ SUSP
20.0000 mL | INTRAMUSCULAR | Status: AC
  Administered 2017-10-26: 20 mL
  Filled 2017-10-25: qty 20

## 2017-10-26 ENCOUNTER — Encounter (HOSPITAL_COMMUNITY)
Admission: RE | Disposition: A | Discharge status: Skilled Nursing Facility | Payer: Self-pay | Source: Ambulatory Visit | Attending: Orthopedic Surgery

## 2017-10-26 ENCOUNTER — Encounter (HOSPITAL_COMMUNITY): Payer: Self-pay | Admitting: *Deleted

## 2017-10-26 ENCOUNTER — Inpatient Hospital Stay (HOSPITAL_COMMUNITY): Payer: Medicare Other | Admitting: Certified Registered"

## 2017-10-26 ENCOUNTER — Inpatient Hospital Stay (HOSPITAL_COMMUNITY)
Admission: RE | Admit: 2017-10-26 | Discharge: 2017-10-29 | DRG: 470 | Disposition: A | Discharge status: Skilled Nursing Facility | Payer: Medicare Other | Source: Ambulatory Visit | Attending: Orthopedic Surgery | Admitting: Orthopedic Surgery

## 2017-10-26 ENCOUNTER — Inpatient Hospital Stay (HOSPITAL_COMMUNITY): Payer: Medicare Other | Admitting: Emergency Medicine

## 2017-10-26 ENCOUNTER — Inpatient Hospital Stay (HOSPITAL_COMMUNITY): Payer: Medicare Other

## 2017-10-26 DIAGNOSIS — Z9181 History of falling: Secondary | ICD-10-CM | POA: Diagnosis not present

## 2017-10-26 DIAGNOSIS — E119 Type 2 diabetes mellitus without complications: Secondary | ICD-10-CM | POA: Diagnosis present

## 2017-10-26 DIAGNOSIS — Z888 Allergy status to other drugs, medicaments and biological substances status: Secondary | ICD-10-CM

## 2017-10-26 DIAGNOSIS — M1612 Unilateral primary osteoarthritis, left hip: Secondary | ICD-10-CM | POA: Diagnosis present

## 2017-10-26 DIAGNOSIS — K08409 Partial loss of teeth, unspecified cause, unspecified class: Secondary | ICD-10-CM | POA: Diagnosis present

## 2017-10-26 DIAGNOSIS — I10 Essential (primary) hypertension: Secondary | ICD-10-CM | POA: Diagnosis present

## 2017-10-26 DIAGNOSIS — Z7984 Long term (current) use of oral hypoglycemic drugs: Secondary | ICD-10-CM

## 2017-10-26 DIAGNOSIS — Z96642 Presence of left artificial hip joint: Secondary | ICD-10-CM | POA: Diagnosis not present

## 2017-10-26 DIAGNOSIS — Z90710 Acquired absence of both cervix and uterus: Secondary | ICD-10-CM

## 2017-10-26 DIAGNOSIS — D62 Acute posthemorrhagic anemia: Secondary | ICD-10-CM | POA: Diagnosis not present

## 2017-10-26 DIAGNOSIS — Z79899 Other long term (current) drug therapy: Secondary | ICD-10-CM

## 2017-10-26 DIAGNOSIS — Z419 Encounter for procedure for purposes other than remedying health state, unspecified: Secondary | ICD-10-CM

## 2017-10-26 DIAGNOSIS — Z9889 Other specified postprocedural states: Secondary | ICD-10-CM | POA: Diagnosis not present

## 2017-10-26 HISTORY — PX: TOTAL HIP ARTHROPLASTY: SHX124

## 2017-10-26 LAB — GLUCOSE, CAPILLARY
GLUCOSE-CAPILLARY: 193 mg/dL — AB (ref 65–99)
GLUCOSE-CAPILLARY: 229 mg/dL — AB (ref 65–99)
Glucose-Capillary: 208 mg/dL — ABNORMAL HIGH (ref 65–99)

## 2017-10-26 LAB — HEMOGLOBIN A1C
Hgb A1c MFr Bld: 8 % — ABNORMAL HIGH (ref 4.8–5.6)
Mean Plasma Glucose: 182.9 mg/dL

## 2017-10-26 IMAGING — RF DG HIP (WITH PELVIS) OPERATIVE*L*
1 series · 2 of 2 positions shown · non-contrast
Comparison: None.

CLINICAL DATA: Left hip replacement

EXAM:
DG C-ARM 61-120 MIN; OPERATIVE LEFT HIP WITH PELVIS

[Series 1: run · 2 of 2 slices shown]
[im 1/2]
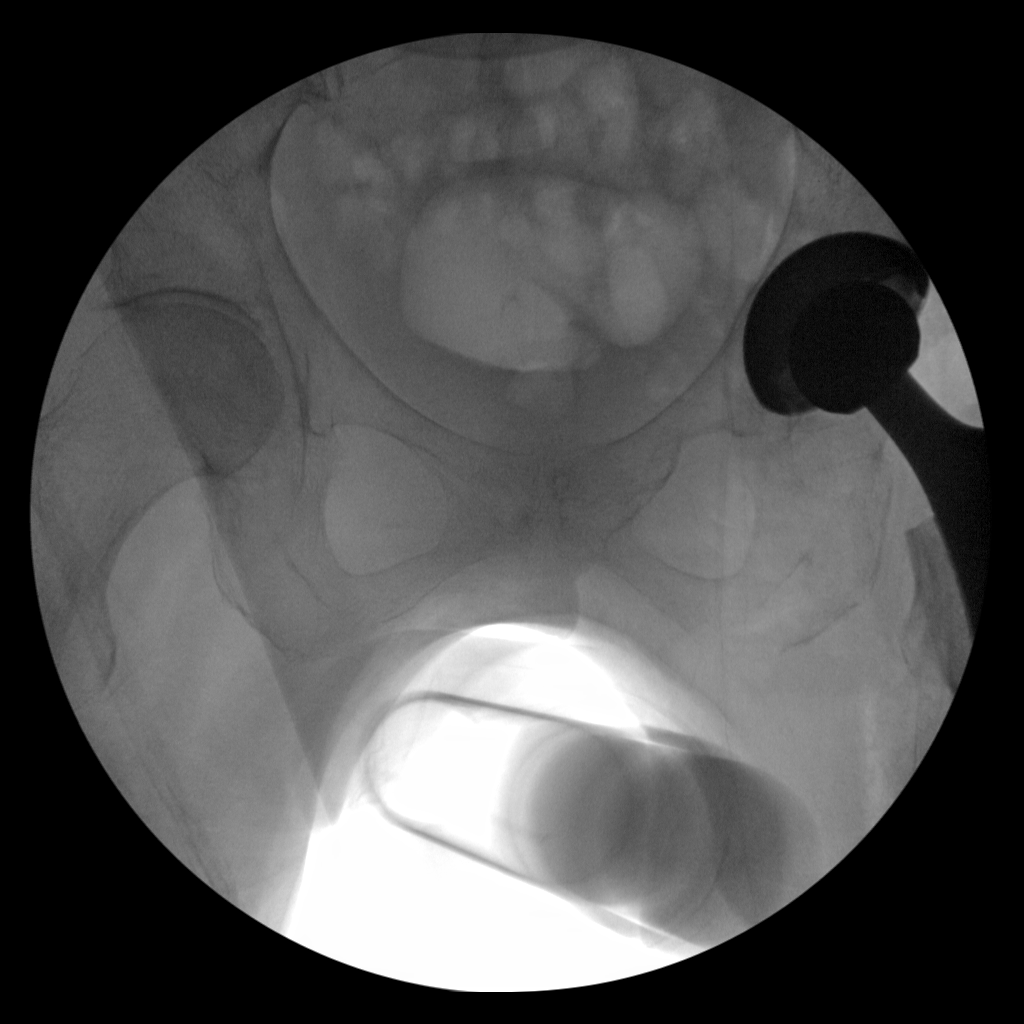
[im 2/2]
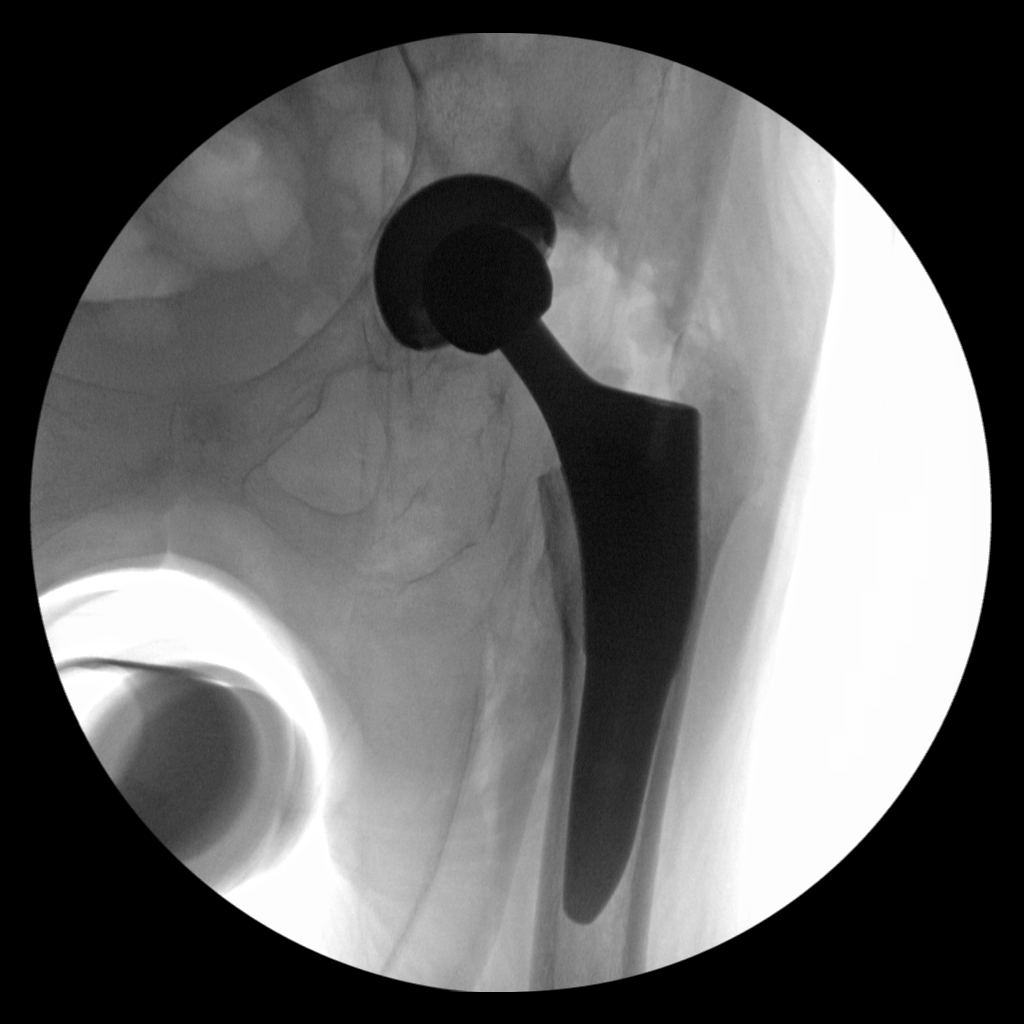

[2 of 2 positions shown; findings below may reference images not displayed]

FINDINGS: Changes of left hip replacement. Normal AP alignment. No hardware or
bony complicating feature noted. Questionable lucency and expansion
of the left inferior pubic ramus laterally.
IMPRESSION: Left hip replacement.  No hardware complicating feature.

Question lucency and expansion of the left lateral inferior pubic
ramus. Recommend left hip series and pelvis, non portable when the
patient is able.

## 2017-10-26 SURGERY — ARTHROPLASTY, HIP, TOTAL, ANTERIOR APPROACH
Anesthesia: Monitor Anesthesia Care | Laterality: Left

## 2017-10-26 MED ORDER — TIMOLOL MALEATE 0.5 % OP SOLN
1.0000 [drp] | Freq: Two times a day (BID) | OPHTHALMIC | Status: DC
  Administered 2017-10-26 – 2017-10-29 (×6): 1 [drp] via OPHTHALMIC
  Filled 2017-10-26: qty 5

## 2017-10-26 MED ORDER — METFORMIN HCL 500 MG PO TABS: 500.0000 mg | ORAL_TABLET | Freq: Once | ORAL | Status: DC

## 2017-10-26 MED ORDER — HYDROMORPHONE HCL 1 MG/ML IJ SOLN: 0.5000 mg | INTRAMUSCULAR | Status: DC | PRN

## 2017-10-26 MED ORDER — CHLORHEXIDINE GLUCONATE 4 % EX LIQD: 60.0000 mL | Freq: Once | CUTANEOUS | Status: DC

## 2017-10-26 MED ORDER — POTASSIUM CHLORIDE CRYS ER 20 MEQ PO TBCR
20.0000 meq | EXTENDED_RELEASE_TABLET | Freq: Every day | ORAL | Status: DC
  Administered 2017-10-27 – 2017-10-29 (×3): 20 meq via ORAL
  Filled 2017-10-26 (×3): qty 1

## 2017-10-26 MED ORDER — EPHEDRINE 5 MG/ML INJ
INTRAVENOUS | Status: AC
  Filled 2017-10-26: qty 10

## 2017-10-26 MED ORDER — HYDRALAZINE HCL 10 MG PO TABS
10.0000 mg | ORAL_TABLET | Freq: Every day | ORAL | Status: DC
  Administered 2017-10-27 – 2017-10-29 (×3): 10 mg via ORAL
  Filled 2017-10-26 (×4): qty 1

## 2017-10-26 MED ORDER — METOPROLOL SUCCINATE ER 50 MG PO TB24
50.0000 mg | ORAL_TABLET | Freq: Every day | ORAL | Status: DC
  Administered 2017-10-27 – 2017-10-29 (×3): 50 mg via ORAL
  Filled 2017-10-26 (×3): qty 1

## 2017-10-26 MED ORDER — 0.9 % SODIUM CHLORIDE (POUR BTL) OPTIME
TOPICAL | Status: DC | PRN
  Administered 2017-10-26: 1000 mL

## 2017-10-26 MED ORDER — METFORMIN HCL 500 MG PO TABS
500.0000 mg | ORAL_TABLET | Freq: Every day | ORAL | Status: DC
  Administered 2017-10-27: 500 mg via ORAL
  Filled 2017-10-26: qty 1

## 2017-10-26 MED ORDER — LIDOCAINE 2% (20 MG/ML) 5 ML SYRINGE
INTRAMUSCULAR | Status: DC | PRN
  Administered 2017-10-26: 20 mg via INTRAVENOUS

## 2017-10-26 MED ORDER — FENTANYL CITRATE (PF) 100 MCG/2ML IJ SOLN
25.0000 ug | INTRAMUSCULAR | Status: DC | PRN
  Administered 2017-10-26: 25 ug via INTRAVENOUS

## 2017-10-26 MED ORDER — BRIMONIDINE TARTRATE-TIMOLOL 0.2-0.5 % OP SOLN
1.0000 [drp] | Freq: Two times a day (BID) | OPHTHALMIC | Status: DC
  Filled 2017-10-26: qty 5

## 2017-10-26 MED ORDER — METOCLOPRAMIDE HCL 5 MG/ML IJ SOLN: 5.0000 mg | Freq: Three times a day (TID) | INTRAMUSCULAR | Status: DC | PRN

## 2017-10-26 MED ORDER — BUPIVACAINE IN DEXTROSE 0.75-8.25 % IT SOLN
INTRATHECAL | Status: DC | PRN
  Administered 2017-10-26: 1.7 mg via INTRATHECAL

## 2017-10-26 MED ORDER — INSULIN ASPART 100 UNIT/ML ~~LOC~~ SOLN
0.0000 [IU] | Freq: Three times a day (TID) | SUBCUTANEOUS | Status: DC
  Administered 2017-10-26: 3 [IU] via SUBCUTANEOUS
  Administered 2017-10-27: 5 [IU] via SUBCUTANEOUS
  Administered 2017-10-27 (×2): 3 [IU] via SUBCUTANEOUS
  Administered 2017-10-28: 5 [IU] via SUBCUTANEOUS
  Administered 2017-10-28 – 2017-10-29 (×2): 3 [IU] via SUBCUTANEOUS
  Administered 2017-10-29 (×2): 2 [IU] via SUBCUTANEOUS

## 2017-10-26 MED ORDER — LATANOPROST 0.005 % OP SOLN
1.0000 [drp] | Freq: Every day | OPHTHALMIC | Status: DC
  Administered 2017-10-26 – 2017-10-28 (×3): 1 [drp] via OPHTHALMIC
  Filled 2017-10-26: qty 2.5

## 2017-10-26 MED ORDER — FENTANYL CITRATE (PF) 100 MCG/2ML IJ SOLN
INTRAMUSCULAR | Status: DC | PRN
  Administered 2017-10-26 (×2): 25 ug via INTRAVENOUS

## 2017-10-26 MED ORDER — BISACODYL 5 MG PO TBEC: 5.0000 mg | DELAYED_RELEASE_TABLET | Freq: Every day | ORAL | Status: DC | PRN

## 2017-10-26 MED ORDER — FLEET ENEMA 7-19 GM/118ML RE ENEM: 1.0000 | ENEMA | Freq: Once | RECTAL | Status: DC | PRN

## 2017-10-26 MED ORDER — CHLORTHALIDONE 25 MG PO TABS
25.0000 mg | ORAL_TABLET | Freq: Every day | ORAL | Status: DC
  Administered 2017-10-27 – 2017-10-29 (×3): 25 mg via ORAL
  Filled 2017-10-26 (×3): qty 1

## 2017-10-26 MED ORDER — PROPOFOL 10 MG/ML IV BOLUS
INTRAVENOUS | Status: DC | PRN
  Administered 2017-10-26: 20 mg via INTRAVENOUS
  Administered 2017-10-26 (×2): 10 mg via INTRAVENOUS
  Administered 2017-10-26 (×2): 20 mg via INTRAVENOUS

## 2017-10-26 MED ORDER — ONDANSETRON HCL 4 MG PO TABS: 4.0000 mg | ORAL_TABLET | Freq: Four times a day (QID) | ORAL | Status: DC | PRN

## 2017-10-26 MED ORDER — PHENYLEPHRINE 40 MCG/ML (10ML) SYRINGE FOR IV PUSH (FOR BLOOD PRESSURE SUPPORT)
PREFILLED_SYRINGE | INTRAVENOUS | Status: AC
  Filled 2017-10-26: qty 10

## 2017-10-26 MED ORDER — DOCUSATE SODIUM 100 MG PO CAPS
100.0000 mg | ORAL_CAPSULE | Freq: Two times a day (BID) | ORAL | Status: DC
  Administered 2017-10-26 – 2017-10-29 (×6): 100 mg via ORAL
  Filled 2017-10-26 (×6): qty 1

## 2017-10-26 MED ORDER — TRAMADOL HCL 50 MG PO TABS: 50.0000 mg | ORAL_TABLET | Freq: Four times a day (QID) | ORAL | Status: DC | PRN

## 2017-10-26 MED ORDER — METHOCARBAMOL 1000 MG/10ML IJ SOLN: 500.0000 mg | Freq: Four times a day (QID) | INTRAVENOUS | Status: DC | PRN

## 2017-10-26 MED ORDER — LACTATED RINGERS IV SOLN
INTRAVENOUS | Status: DC | PRN
  Administered 2017-10-26 (×2): via INTRAVENOUS

## 2017-10-26 MED ORDER — MENTHOL 3 MG MT LOZG: 1.0000 | LOZENGE | OROMUCOSAL | Status: DC | PRN

## 2017-10-26 MED ORDER — GLYCOPYRROLATE 0.2 MG/ML IJ SOLN
INTRAMUSCULAR | Status: DC | PRN
  Administered 2017-10-26: 0.2 mg via INTRAVENOUS

## 2017-10-26 MED ORDER — TRANEXAMIC ACID 1000 MG/10ML IV SOLN
INTRAVENOUS | Status: DC | PRN
  Administered 2017-10-26: 1000 mg via INTRAVENOUS

## 2017-10-26 MED ORDER — ACETAMINOPHEN 325 MG PO TABS
650.0000 mg | ORAL_TABLET | ORAL | Status: DC | PRN
  Administered 2017-10-26: 650 mg via ORAL
  Filled 2017-10-26: qty 2

## 2017-10-26 MED ORDER — GLIMEPIRIDE 4 MG PO TABS
4.0000 mg | ORAL_TABLET | Freq: Every day | ORAL | Status: DC
  Administered 2017-10-27 – 2017-10-29 (×3): 4 mg via ORAL
  Filled 2017-10-26 (×4): qty 1

## 2017-10-26 MED ORDER — OXYCODONE HCL 5 MG PO TABS: 5.0000 mg | ORAL_TABLET | Freq: Once | ORAL | Status: DC | PRN

## 2017-10-26 MED ORDER — TRANEXAMIC ACID 1000 MG/10ML IV SOLN
1000.0000 mg | Freq: Once | INTRAVENOUS | Status: DC
  Filled 2017-10-26: qty 10

## 2017-10-26 MED ORDER — DEXAMETHASONE SODIUM PHOSPHATE 10 MG/ML IJ SOLN
10.0000 mg | Freq: Once | INTRAMUSCULAR | Status: AC
  Administered 2017-10-27: 10 mg via INTRAVENOUS
  Filled 2017-10-26: qty 1

## 2017-10-26 MED ORDER — POTASSIUM CHLORIDE ER 20 MEQ PO TBCR
20.0000 meq | EXTENDED_RELEASE_TABLET | Freq: Every day | ORAL | Status: DC
Start: 2017-10-26 — End: 2017-10-26

## 2017-10-26 MED ORDER — GABAPENTIN 300 MG PO CAPS
300.0000 mg | ORAL_CAPSULE | Freq: Three times a day (TID) | ORAL | Status: DC
  Administered 2017-10-26 – 2017-10-29 (×10): 300 mg via ORAL
  Filled 2017-10-26 (×10): qty 1

## 2017-10-26 MED ORDER — PHENYLEPHRINE HCL 10 MG/ML IJ SOLN
INTRAVENOUS | Status: DC | PRN
  Administered 2017-10-26: 20 ug/min via INTRAVENOUS

## 2017-10-26 MED ORDER — LIDOCAINE 2% (20 MG/ML) 5 ML SYRINGE
INTRAMUSCULAR | Status: AC
  Filled 2017-10-26: qty 5

## 2017-10-26 MED ORDER — OXYCODONE HCL 5 MG/5ML PO SOLN: 5.0000 mg | Freq: Once | ORAL | Status: DC | PRN

## 2017-10-26 MED ORDER — BRIMONIDINE TARTRATE 0.2 % OP SOLN
1.0000 [drp] | Freq: Two times a day (BID) | OPHTHALMIC | Status: DC
  Administered 2017-10-26 – 2017-10-29 (×6): 1 [drp] via OPHTHALMIC
  Filled 2017-10-26: qty 5

## 2017-10-26 MED ORDER — METOPROLOL SUCCINATE 50 MG PO CS24: 50.0000 mg | EXTENDED_RELEASE_CAPSULE | Freq: Every day | ORAL | Status: DC

## 2017-10-26 MED ORDER — PROPOFOL 500 MG/50ML IV EMUL
INTRAVENOUS | Status: DC | PRN
  Administered 2017-10-26: 20 ug/kg/min via INTRAVENOUS

## 2017-10-26 MED ORDER — ASPIRIN EC 325 MG PO TBEC: 325.0000 mg | DELAYED_RELEASE_TABLET | Freq: Two times a day (BID) | ORAL | 0 refills | Status: DC

## 2017-10-26 MED ORDER — DIPHENHYDRAMINE HCL 12.5 MG/5ML PO ELIX: 12.5000 mg | ORAL_SOLUTION | ORAL | Status: DC | PRN

## 2017-10-26 MED ORDER — ACETAMINOPHEN 650 MG RE SUPP: 650.0000 mg | RECTAL | Status: DC | PRN

## 2017-10-26 MED ORDER — PHENYLEPHRINE 40 MCG/ML (10ML) SYRINGE FOR IV PUSH (FOR BLOOD PRESSURE SUPPORT)
PREFILLED_SYRINGE | INTRAVENOUS | Status: DC | PRN
  Administered 2017-10-26: 80 ug via INTRAVENOUS

## 2017-10-26 MED ORDER — FENTANYL CITRATE (PF) 100 MCG/2ML IJ SOLN: 25.0000 ug | INTRAMUSCULAR | Status: DC | PRN

## 2017-10-26 MED ORDER — ASPIRIN EC 325 MG PO TBEC: 325.0000 mg | DELAYED_RELEASE_TABLET | Freq: Every day | ORAL | Status: DC

## 2017-10-26 MED ORDER — PHENOL 1.4 % MT LIQD
1.0000 | OROMUCOSAL | Status: DC | PRN
Start: 2017-10-26 — End: 2017-10-29

## 2017-10-26 MED ORDER — CYCLOSPORINE 0.05 % OP EMUL
1.0000 [drp] | Freq: Two times a day (BID) | OPHTHALMIC | Status: DC
  Administered 2017-10-26 – 2017-10-29 (×6): 1 [drp] via OPHTHALMIC
  Filled 2017-10-26 (×7): qty 1

## 2017-10-26 MED ORDER — BUPIVACAINE-EPINEPHRINE (PF) 0.5% -1:200000 IJ SOLN
INTRAMUSCULAR | Status: DC | PRN
  Administered 2017-10-26: 30 mL via PERINEURAL

## 2017-10-26 MED ORDER — FENTANYL CITRATE (PF) 100 MCG/2ML IJ SOLN
INTRAMUSCULAR | Status: AC
  Filled 2017-10-26: qty 2

## 2017-10-26 MED ORDER — METOCLOPRAMIDE HCL 5 MG PO TABS: 5.0000 mg | ORAL_TABLET | Freq: Three times a day (TID) | ORAL | Status: DC | PRN

## 2017-10-26 MED ORDER — ALUMINUM HYDROXIDE GEL 320 MG/5ML PO SUSP: 15.0000 mL | ORAL | Status: DC | PRN

## 2017-10-26 MED ORDER — TIZANIDINE HCL 2 MG PO TABS: 2.0000 mg | ORAL_TABLET | Freq: Four times a day (QID) | ORAL | 0 refills | Status: DC | PRN

## 2017-10-26 MED ORDER — OXYCODONE HCL 5 MG PO TABS
5.0000 mg | ORAL_TABLET | ORAL | Status: DC | PRN
  Administered 2017-10-27: 5 mg via ORAL
  Filled 2017-10-26: qty 1

## 2017-10-26 MED ORDER — PROPOFOL 10 MG/ML IV BOLUS
INTRAVENOUS | Status: AC
  Filled 2017-10-26: qty 20

## 2017-10-26 MED ORDER — BUPIVACAINE-EPINEPHRINE (PF) 0.5% -1:200000 IJ SOLN
INTRAMUSCULAR | Status: AC
  Filled 2017-10-26: qty 30

## 2017-10-26 MED ORDER — OXYCODONE HCL 5 MG PO TABS: 10.0000 mg | ORAL_TABLET | ORAL | Status: DC | PRN

## 2017-10-26 MED ORDER — METHOCARBAMOL 500 MG PO TABS
500.0000 mg | ORAL_TABLET | Freq: Four times a day (QID) | ORAL | Status: DC | PRN
  Administered 2017-10-27 – 2017-10-28 (×2): 500 mg via ORAL
  Filled 2017-10-26 (×2): qty 1

## 2017-10-26 MED ORDER — ONDANSETRON HCL 4 MG/2ML IJ SOLN
INTRAMUSCULAR | Status: DC | PRN
  Administered 2017-10-26: 4 mg via INTRAVENOUS

## 2017-10-26 MED ORDER — LOSARTAN POTASSIUM 50 MG PO TABS
100.0000 mg | ORAL_TABLET | Freq: Every day | ORAL | Status: DC
  Administered 2017-10-27 – 2017-10-29 (×3): 100 mg via ORAL
  Filled 2017-10-26 (×4): qty 2

## 2017-10-26 MED ORDER — ONDANSETRON HCL 4 MG/2ML IJ SOLN
INTRAMUSCULAR | Status: AC
  Filled 2017-10-26: qty 2

## 2017-10-26 MED ORDER — LACTATED RINGERS IV SOLN
INTRAVENOUS | Status: DC
  Administered 2017-10-26: 09:00:00 via INTRAVENOUS

## 2017-10-26 MED ORDER — OXYCODONE-ACETAMINOPHEN 5-325 MG PO TABS: 1.0000 | ORAL_TABLET | ORAL | 0 refills | Status: DC | PRN

## 2017-10-26 MED ORDER — FENTANYL CITRATE (PF) 250 MCG/5ML IJ SOLN
INTRAMUSCULAR | Status: AC
  Filled 2017-10-26: qty 5

## 2017-10-26 MED ORDER — POLYETHYLENE GLYCOL 3350 17 G PO PACK: 17.0000 g | PACK | Freq: Every day | ORAL | Status: DC | PRN

## 2017-10-26 MED ORDER — KCL IN DEXTROSE-NACL 20-5-0.45 MEQ/L-%-% IV SOLN
INTRAVENOUS | Status: DC
  Filled 2017-10-26 (×3): qty 1000

## 2017-10-26 MED ORDER — ONDANSETRON HCL 4 MG/2ML IJ SOLN: 4.0000 mg | Freq: Four times a day (QID) | INTRAMUSCULAR | Status: DC | PRN

## 2017-10-26 SURGICAL SUPPLY — 40 items
BAG DECANTER FOR FLEXI CONT (MISCELLANEOUS) ×2 IMPLANT
BLADE SAW SGTL 18X1.27X75 (BLADE) ×2 IMPLANT
CAPT HIP TOTAL 2 ×2 IMPLANT
COVER PERINEAL POST (MISCELLANEOUS) ×2 IMPLANT
COVER SURGICAL LIGHT HANDLE (MISCELLANEOUS) ×2 IMPLANT
DRAPE C-ARM 42X72 X-RAY (DRAPES) ×2 IMPLANT
DRAPE STERI IOBAN 125X83 (DRAPES) ×2 IMPLANT
DRAPE U-SHAPE 47X51 STRL (DRAPES) ×4 IMPLANT
DRSG AQUACEL AG ADV 3.5X10 (GAUZE/BANDAGES/DRESSINGS) ×2 IMPLANT
DURAPREP 26ML APPLICATOR (WOUND CARE) ×2 IMPLANT
ELECT BLADE 4.0 EZ CLEAN MEGAD (MISCELLANEOUS) ×2
ELECT REM PT RETURN 9FT ADLT (ELECTROSURGICAL) ×2
ELECTRODE BLDE 4.0 EZ CLN MEGD (MISCELLANEOUS) ×1 IMPLANT
ELECTRODE REM PT RTRN 9FT ADLT (ELECTROSURGICAL) ×1 IMPLANT
FACESHIELD WRAPAROUND (MASK) ×4 IMPLANT
GLOVE BIO SURGEON STRL SZ7.5 (GLOVE) ×2 IMPLANT
GLOVE BIO SURGEON STRL SZ8.5 (GLOVE) ×2 IMPLANT
GLOVE BIOGEL PI IND STRL 8 (GLOVE) ×1 IMPLANT
GLOVE BIOGEL PI IND STRL 9 (GLOVE) ×1 IMPLANT
GLOVE BIOGEL PI INDICATOR 8 (GLOVE) ×1
GLOVE BIOGEL PI INDICATOR 9 (GLOVE) ×1
GOWN STRL REUS W/ TWL LRG LVL3 (GOWN DISPOSABLE) ×1 IMPLANT
GOWN STRL REUS W/ TWL XL LVL3 (GOWN DISPOSABLE) ×2 IMPLANT
GOWN STRL REUS W/TWL LRG LVL3 (GOWN DISPOSABLE) ×1
GOWN STRL REUS W/TWL XL LVL3 (GOWN DISPOSABLE) ×2
KIT BASIN OR (CUSTOM PROCEDURE TRAY) ×2 IMPLANT
KIT ROOM TURNOVER OR (KITS) ×2 IMPLANT
MANIFOLD NEPTUNE II (INSTRUMENTS) ×2 IMPLANT
NEEDLE HYPO 22GX1.5 SAFETY (NEEDLE) ×4 IMPLANT
NS IRRIG 1000ML POUR BTL (IV SOLUTION) ×2 IMPLANT
PACK TOTAL JOINT (CUSTOM PROCEDURE TRAY) ×2 IMPLANT
PAD ARMBOARD 7.5X6 YLW CONV (MISCELLANEOUS) ×4 IMPLANT
SUT VIC AB 1 CTX 36 (SUTURE) ×1
SUT VIC AB 1 CTX36XBRD ANBCTR (SUTURE) ×1 IMPLANT
SUT VIC AB 2-0 CT1 27 (SUTURE) ×1
SUT VIC AB 2-0 CT1 TAPERPNT 27 (SUTURE) ×1 IMPLANT
SUT VIC AB 3-0 CT1 27 (SUTURE) ×1
SUT VIC AB 3-0 CT1 TAPERPNT 27 (SUTURE) ×1 IMPLANT
SYR CONTROL 10ML LL (SYRINGE) ×4 IMPLANT
TRAY CATH 16FR W/PLASTIC CATH (SET/KITS/TRAYS/PACK) IMPLANT

## 2017-10-26 NOTE — Anesthesia Procedure Notes (Signed)
Procedure Name: MAC Date/Time: 10/26/2017 10:06 AM Performed by: Orlie Dakin, CRNA Pre-anesthesia Checklist: Patient identified, Emergency Drugs available, Suction available, Patient being monitored and Timeout performed Patient Re-evaluated:Patient Re-evaluated prior to induction Oxygen Delivery Method: Simple face mask Preoxygenation: Pre-oxygenation with 100% oxygen

## 2017-10-26 NOTE — Progress Notes (Signed)
Orthopedic Tech Progress Note Patient Details:  Alicia Vang Jan 02, 1927 [id number]  Ortho Devices Ortho Device/Splint Location: Trapeze bar Ortho Device/Splint Interventions: Application   Post Interventions Patient Tolerated: Well Instructions Provided: Care of device, Adjustment of device   [name] 10/26/2017, 6:36 PM

## 2017-10-26 NOTE — Interval H&P Note (Signed)
History and Physical Interval Note:  10/26/2017 9:10 AM  Alicia Vang  has presented today for surgery, with the diagnosis of LEFT HIP OSTEOARTHRITIS  The various methods of treatment have been discussed with the patient and family. After consideration of risks, benefits and other options for treatment, the patient has consented to  Procedure(s): TOTAL HIP ARTHROPLASTY ANTERIOR APPROACH (Left) as a surgical intervention .  The patient's history has been reviewed, patient examined, no change in status, stable for surgery.  I have reviewed the patient's chart and labs.  Questions were answered to the patient's satisfaction.     Nestor LewandowskyFrank J Azekiel Cremer

## 2017-10-26 NOTE — Op Note (Signed)
OPERATIVE REPORT    DATE OF PROCEDURE:  10/26/2017       PREOPERATIVE DIAGNOSIS:  LEFT HIP OSTEOARTHRITIS                                                          POSTOPERATIVE DIAGNOSIS:  LEFT HIP OSTEOARTHRITIS                                                           PROCEDURE: Anterior L total hip arthroplasty using a 50 mm DePuy Gryption Cup, [place] E[employer]x Hole Eliminator, 0-degree polyethylene liner, a +9x32 mm ceramic head, a 8 hi Depuy Triloc stem   SURGEON: Nestor LewandowskyFrank J Shawnique Mariotti    ASSISTANT:   Tomi LikensEric K. [employer] PA-C  (present throughout entire procedure and necessary for timely completion of the procedure)   ANESTHESIA: Spinal BLOOD LOSS: 400 FLUID REPLACEMENT: 1500 crystalloid Antibiotic: 2gm ancef Tranexamic Acid: 1gm IV, 2gm Topical COMPLICATIONS: none    INDICATIONS FOR PROCEDURE: A 84 y.o. year-old With  LEFT HIP OSTEOARTHRITIS   for 5 years, x-rays show bone-on-bone arthritic changes, and osteophytes. Despite conservative measures with observation, anti-inflammatory medicine, narcotics, use of a cane, has severe unremitting pain and can ambulate only a few blocks before resting. Patient desires elective L total hip arthroplasty to decrease pain and increase function. The risks, benefits, and alternatives were discussed at length including but not limited to the risks of infection, bleeding, nerve injury, stiffness, blood clots, the need for revision surgery, cardiopulmonary complications, among others, and they were willing to proceed. Questions answered     PROCEDURE IN DETAIL: The patient was identified by armband,  received preoperative [facility], taken to the operating room , appropriate anesthetic monitors  were attached and  anesthesia was induced with the patienton the gurney. The HANA boots were applied to the feet and he was then transferred to the HANA table with a peroneal post and support underneath the non-operative le, which  was locked in 5 lb traction. Theoperative lower extremity was then prepped and draped in the usual sterile fashion from just above the iliac crest to the knee. And a timeout procedure was performed. We then made a 12 cm incision along the interval at the leading edge of the tensor fascia lata of starting at 2 cm lateral to and 2 cm distal to the ASIS. Small bleeders in the skin and subcutaneous tissue identified and cauterized we dissected down to the fascia and made an incision in the fascia allowing us to elevate the fascia of the tensor muscle and exploited the interval between the rectus and the tensor fascia lata. A Hohmann retractor was then placed along the superior neck of the femur and a Cobra retractor along the inferior neck of the femur we teed the capsule starting out at the superior anterior aspect of the acetabulum going distally and made the T along the neck both leaflets of the T were tagged with #2 Ethibond suture. Cobra retractors were then placed along the inferior and superior neck allowing us to perform a standard neck cut and removed  the femoral head with a power corkscrew. We then placed a right angle Hohmann retractor along the anterior aspect of the acetabulum a spiked Cobra in the cotyloid notch and posteriorly a Muelller retractor. We then sequentially reamed up to a 49 mm basket reamer obtaining good coverage in all quadrants, verified by C-arm imaging. Under C-arm control with and hammered into place a 50 mm Gryption cup in 45 of abduction and 15 of anteversion. The cup seated nicely and required no supplemental screws. We then placed a central hole Eliminator and a 0 polyethylene liner. The foot was then externally rotated to 110, the HANA elevator was placed around the flare of the greater trochanter and the limb was extended and abducted delivering the proximal femur up into the wound. A medium Hohmann retractor was placed over the greater trochanter and a Mueller retractor along  the posterior femoral neck completing the exposure. We then performed releases superiorly and and inferiorly of the capsule going back to the pirformis fossa superiorly and to the lesser trochanter inferiorly. We then entered the proximal femur with the box cutting offset chisel followed by, a canal sounder, the chili pepper and broaching up to a 8 broach. This seated nicely and we reamed the calcar. A trial reduction was performed with a 9 mm 32 mm head.The limb lengths were excellent the hip was stable in 90 of external rotation. At this point the trial components removed and we hammered into place a # 8 hi  Offset Tri-Lock stem with Gryption coating. A + 9x32 mm ceramic ball was then hammered into place the hip was reduced and final C-arm images obtained. The wound was thoroughly irrigated with normal saline solution. We repaired the ant capsule and the tensor fascia lot a with running 0 vicryl suture. the subcutaneous tissue was closed with 2-0 and 3-0 Vicryl suture followed by an Aquacil dressing. At this point the patient was awaken and transferred to hospital gurney without difficulty. The subcutaneous tissue with 0 and 2-0 undyed Vicryl suture and the skin with running  3-0 vicryl subcuticular suture. Aquacil dressing was applied. The patient was then unclamped, rolled supine, awaken extubated and taken to recovery room without difficulty in stable condition.   Nestor Lewandowsky 10/26/2017, 11:23 AM

## 2017-10-26 NOTE — Anesthesia Procedure Notes (Signed)
Spinal  Patient location during procedure: OR Start time: 10/26/2017 9:57 AM End time: 10/26/2017 10:00 AM Staffing Anesthesiologist: Val EagleMoser, Sharan Mcenaney, MD Preanesthetic Checklist Completed: patient identified, surgical consent, pre-op evaluation, timeout performed, IV checked, risks and benefits discussed and monitors and equipment checked Spinal Block Patient position: sitting Prep: site prepped and draped and DuraPrep Patient monitoring: heart rate, cardiac monitor, continuous pulse ox and blood pressure Approach: midline Location: L4-5 Injection technique: single-shot Needle Needle type: Pencan  Needle gauge: 24 G Needle length: 10 cm Assessment Sensory level: T6

## 2017-10-26 NOTE — Discharge Instructions (Signed)

## 2017-10-26 NOTE — Evaluation (Signed)
Physical Therapy Evaluation Patient Details Name: Alicia Vang MRN: [id number] DOB: 1927/05/02 Today's Date: 10/26/2017   History of Present Illness  84 y.o. female s/p L THA  1/04. PMH includes:  HTN, DM, Arthritis  Clinical Impression  Patient is s/p above surgery resulting in functional limitations due to the deficits listed below (see PT Problem List). PTA, patient was living with daughter in 2 story home with stairs to enter, ambulating with Rolator and receiving assistance with ADLs. Prior to October patient was mod I living alone. Patients daughter is avail PRN and has a niece that will be avail for 24/7 support for 5-7 days after d/cing from hospital. Upon eval, patient presents with post op pain, weakness, and lethargy/confusion different from baseline per daugther. Patient currently max A for OOB mobility and unable to progress ambulation at this time. Daughter expresses home with HHPT is their preference and  discussed hopes for progression tomorrow with family. I anticipate patient will do better with therapy and will plan to progress as tolerated.   Patient will benefit from skilled PT to increase their independence and safety with mobility to allow discharge to the venue listed below.        Follow Up Recommendations Supervision/Assistance - 24 hour;DC plan and follow up therapy as arranged by surgeon;Home health PT    Equipment Recommendations  Rolling walker with 5" wheels;3in1 (PT)    Recommendations for Other Services OT consult     Precautions / Restrictions Precautions Precautions: Fall;Anterior Hip Precaution Booklet Issued: (Verbally reviewed with patient and family tonight) Precaution Comments: no abduction, hip extension, ER Restrictions Weight Bearing Restrictions: Yes      Mobility  Bed Mobility Overal bed mobility: Needs Assistance Bed Mobility: Supine to Sit     Supine to sit: Mod assist     General bed mobility comments: Mod A for LLE assist  and help elevating trunk  Transfers Overall transfer level: Needs assistance Equipment used: Rolling walker (2 wheeled);1 person hand held assist Transfers: Sit to/from [employer] Sit to Stand: Max assist Stand pivot transfers: Max assist       General transfer comment: Max A to assist with transfers and maintain stability. patient confused and needed to get to [facility]. Anticpiate improvment next visit. Fear of falling despite assurances.   Ambulation/Gait             General Gait Details: unable  Stairs            Wheelchair Mobility    Modified Rankin (Stroke Patients Only)       Balance Overall balance assessment: Needs assistance Sitting-balance support: Feet unsupported;No upper extremity supported Sitting balance-[name] Scale: Fair     Standing balance support: During functional activity;Bilateral upper extremity supported Standing balance-[name] Scale: Poor                               Pertinent Vitals/Pain Pain Assessment: No/denies pain    Home Living Family/patient expects to be discharged to:: Private residence Living Arrangements: Children Available Help at Discharge: Family;Available 24 hours/day;Available PRN/intermittently(Niece avail 24/7 for 5 days- daughter works during day) Type of Home: House Home Access: Stairs to enter Entrance Stairs-Rails: Left Entrance Stairs-Number of Steps: 3 Home Layout: Two level;Able to live on main level with bedroom/bathroom(1/2 bath on first floor only.) Home Equipment: Walker - 4 wheels      Prior Function Level of Independence: Independent with assistive device(s)  Comments: ambulating with rolator      Hand Dominance        Extremity/Trunk Assessment   Upper Extremity Assessment Upper Extremity Assessment: Defer to OT evaluation;[facility] for tasks assessed    Lower Extremity Assessment Lower Extremity Assessment: Generalized weakness(LLE post op  weakness. RLE 3+/5 gross)    Cervical / Trunk Assessment Cervical / Trunk Assessment: Kyphotic  Communication   Communication: No difficulties  Cognition Arousal/Alertness: Lethargic;Suspect due to medications Behavior During Therapy: [facility] for tasks assessed/performed;Flat affect Overall Cognitive Status: Impaired/Different from baseline Area of Impairment: Following commands;Safety/judgement;Awareness                       Following Commands: Follows one step commands inconsistently Safety/Judgement: Decreased awareness of safety     General Comments: Pt lethargic post op. different from baseline per daugther      General Comments General comments (skin integrity, edema, etc.): Extensive dicussion with daughter over safety considerations for d/c home. Patient pulled her IV line out while sitting on commode. RN in room and assisted with bandaging. transfered pt to bedside chair so nursing could clean up room before return to bed.     Exercises     Assessment/Plan    PT Assessment Patient needs continued PT services  PT Problem List Decreased strength;Decreased range of motion;Decreased activity tolerance;Decreased balance;Decreased safety awareness;Pain;Decreased mobility;Decreased cognition       PT Treatment Interventions DME instruction;Gait training;Stair training;Functional mobility training;Therapeutic activities;Therapeutic exercise    PT Goals (Current goals can be found in the Care Plan section)  Acute Rehab PT Goals Patient Stated Goal: none stated PT Goal Formulation: With patient/family Time For Goal Achievement: 11/02/17 Potential to Achieve Goals: Fair    Frequency 7X/week   Barriers to discharge        Co-evaluation               AM-PAC PT "6 Clicks" Daily Activity  Outcome Measure Difficulty turning over in bed (including adjusting bedclothes, sheets and blankets)?: Unable Difficulty moving from lying on back to sitting on the side of  the bed? : Unable Difficulty sitting down on and standing up from a chair with arms (e.g., wheelchair, bedside commode, etc,.)?: Unable Help needed moving to and from a bed to chair (including a wheelchair)?: A Lot Help needed walking in hospital room?: A Lot Help needed climbing 3-5 steps with a railing? : Total 6 Click Score: 8    End of Session Equipment Utilized During Treatment: Gait belt Activity Tolerance: Patient limited by lethargy Patient left: in chair;with call bell/phone within reach;with family/visitor present Nurse Communication: Mobility status PT Visit Diagnosis: Unsteadiness on feet (R26.81);Other abnormalities of gait and mobility (R26.89);Muscle weakness (generalized) (M62.81);Pain Pain - Right/Left: Left Pain - part of body: Hip    Time: 1700-1800(time spent with BSC) PT Time Calculation (min) (ACUTE ONLY): 60 min   Charges:   PT Evaluation $PT Eval Low Complexity: 1 Low PT Treatments $Therapeutic Activity: 8-22 mins $Self Care/Home Management: 8-22   PT G Codes:        Etta GrandchildSean Adaiah Jaskot, PT, DPT Acute Rehab Services Pager: [phone]    Etta GrandchildSean  Devonta Blanford 10/26/2017, 6:15 PM

## 2017-10-26 NOTE — Anesthesia Preprocedure Evaluation (Signed)
Anesthesia Evaluation  Patient identified by MRN, date of birth, ID band Patient awake    Reviewed: Allergy & Precautions, NPO status , Patient's Chart, lab work & pertinent test results  History of Anesthesia Complications Negative for: history of anesthetic complications  Airway Mallampati: II  TM Distance: >3 FB Neck ROM: Full    Dental  (+) Missing   Pulmonary neg pulmonary ROS,    breath sounds clear to auscultation       Cardiovascular hypertension, Pt. on medications (-) angina(-) Past MI and (-) CHF  Rhythm:Regular     Neuro/Psych negative neurological ROS  negative psych ROS   GI/Hepatic negative GI ROS, Neg liver ROS,   Endo/Other  diabetes, Type 2  Renal/GU negative Renal ROS     Musculoskeletal  (+) Arthritis ,   Abdominal   Peds  Hematology  (+) anemia ,   Anesthesia Other Findings   Reproductive/Obstetrics                             Anesthesia Physical Anesthesia Plan  ASA: II  Anesthesia Plan: MAC and Spinal   Post-op Pain Management:    Induction:   PONV Risk Score and Plan: 2 and Ondansetron  Airway Management Planned: Nasal Cannula  Additional Equipment:   Intra-op Plan:   Post-operative Plan:   Informed Consent: I have reviewed the patients History and Physical, chart, labs and discussed the procedure including the risks, benefits and alternatives for the proposed anesthesia with the patient or authorized representative who has indicated his/her understanding and acceptance.   Dental advisory given  Plan Discussed with: CRNA and Surgeon  Anesthesia Plan Comments:         Anesthesia Quick Evaluation

## 2017-10-26 NOTE — Transfer of Care (Signed)
Immediate Anesthesia Transfer of Care Note  Patient: Alicia Vang  Procedure(s) Performed: TOTAL HIP ARTHROPLASTY ANTERIOR APPROACH (Left )  Patient Location: PACU  Anesthesia Type:MAC and Spinal  Level of Consciousness: awake, oriented and patient cooperative  Airway & Oxygen Therapy: Patient Spontanous Breathing and Patient connected to face mask oxygen  Post-op Assessment: Report given to RN and Post -op Vital signs reviewed and stable  Post vital signs: Reviewed and stable  Last Vitals:  Vitals:   10/26/17 0816 10/26/17 0822  BP:  (!) 171/81  Pulse: 92   Resp: 18   Temp: 37 C   SpO2: 100%     Last Pain:  Vitals:   10/26/17 0816  TempSrc: Oral         Complications: No apparent anesthesia complications

## 2017-10-27 LAB — GLUCOSE, CAPILLARY
GLUCOSE-CAPILLARY: 278 mg/dL — AB (ref 65–99)
GLUCOSE-CAPILLARY: 229 mg/dL — AB (ref 65–99)
GLUCOSE-CAPILLARY: 230 mg/dL — AB (ref 65–99)
Glucose-Capillary: 201 mg/dL — ABNORMAL HIGH (ref 65–99)

## 2017-10-27 LAB — HEMOGLOBIN AND HEMATOCRIT, BLOOD
HEMATOCRIT: 33 % — AB (ref 36.0–46.0)
HEMOGLOBIN: 11.1 g/dL — AB (ref 12.0–15.0)

## 2017-10-27 LAB — BASIC METABOLIC PANEL
Anion gap: 10 (ref 5–15)
BUN: 21 mg/dL — ABNORMAL HIGH (ref 6–20)
CHLORIDE: 95 mmol/L — AB (ref 101–111)
CO2: 27 mmol/L (ref 22–32)
Calcium: 8.1 mg/dL — ABNORMAL LOW (ref 8.9–10.3)
Creatinine, Ser: 1.25 mg/dL — ABNORMAL HIGH (ref 0.44–1.00)
GFR calc non Af Amer: 37 mL/min — ABNORMAL LOW (ref >60)
GFR, EST AFRICAN AMERICAN: 43 mL/min — AB (ref >60)
Glucose, Bld: 295 mg/dL — ABNORMAL HIGH (ref 65–99)
POTASSIUM: 3.4 mmol/L — AB (ref 3.5–5.1)
SODIUM: 132 mmol/L — AB (ref 135–145)

## 2017-10-27 LAB — CBC
HEMATOCRIT: 22.6 % — AB (ref 36.0–46.0)
HEMOGLOBIN: 7.3 g/dL — AB (ref 12.0–15.0)
MCH: 27.9 pg (ref 26.0–34.0)
MCHC: 32.3 g/dL (ref 30.0–36.0)
MCV: 86.3 fL (ref 78.0–100.0)
Platelets: 164 10*3/uL (ref 150–400)
RBC: 2.62 MIL/uL — AB (ref 3.87–5.11)
RDW: 13.8 % (ref 11.5–15.5)
WBC: 5.4 10*3/uL (ref 4.0–10.5)

## 2017-10-27 LAB — PREPARE RBC (CROSSMATCH)

## 2017-10-27 MED ORDER — FUROSEMIDE 10 MG/ML IJ SOLN
20.0000 mg | Freq: Once | INTRAMUSCULAR | Status: AC
  Administered 2017-10-27: 20 mg via INTRAVENOUS
  Filled 2017-10-27: qty 2

## 2017-10-27 MED ORDER — ASPIRIN EC 325 MG PO TBEC
325.0000 mg | DELAYED_RELEASE_TABLET | Freq: Two times a day (BID) | ORAL | Status: DC
  Administered 2017-10-27 – 2017-10-29 (×5): 325 mg via ORAL
  Filled 2017-10-27 (×6): qty 1

## 2017-10-27 MED ORDER — SODIUM CHLORIDE 0.9 % IV SOLN
Freq: Once | INTRAVENOUS | Status: AC
  Administered 2017-10-27: 09:00:00 via INTRAVENOUS

## 2017-10-27 NOTE — Progress Notes (Signed)
Physical Therapy Treatment Patient Details Name: Alicia Vang S Wyatt MRN: [id number] DOB: [birth year] Today's Date: 10/27/2017    History of Present Illness 84 y.o. female s/p L THA  1/04. PMH includes:  HTN, DM, Arthritis    PT Comments    Session focused on progressing OOB mobility and short distance ambulation in hospital room. Patient has become more alert since prior session however still requires mod-max A for transfers and walking inside RW. Due to slow progression, updating recommendations to sub-acute inpatient rehab level therapy before safe return home. Family very involved and are realizing the level of hands on assistance for the patient may too demanding, and express concerns of her being alone at any time now. Plan to work with family and patient this evening and progress as tolerated. Will continue to assess d/c recommendations and update again if appropriate.     Follow Up Recommendations  SNF;Supervision/Assistance - 24 hour;DC plan and follow up therapy as arranged by surgeon     Equipment Recommendations  Rolling walker with 5" wheels;3in1 (PT)    Recommendations for Other Services       Precautions / Restrictions Precautions Precautions: Fall;Anterior Hip Precaution Comments: no abduction, hip extension, ER Restrictions Weight Bearing Restrictions: Yes LLE Weight Bearing: Weight bearing as tolerated    Mobility  Bed Mobility               General bed mobility comments: OOB at entry  Transfers Overall transfer level: Needs assistance Equipment used: Rolling walker (2 wheeled);1 person hand held assist Transfers: Sit to/from [employer];Stand Pivot Transfers Sit to Stand: Mod assist;Max assist Stand pivot transfers: Max assist       General transfer comment: Mod assist to rise to standing. Max assist to attempt taking a few steps away from EOB. Required step-by-step cues.   Ambulation/Gait Ambulation/Gait assistance: Mod assist;Max assist Ambulation  Distance (Feet): 3 Feet Assistive device: Rolling walker (2 wheeled) Gait Pattern/deviations: Step-to pattern;Antalgic Gait velocity: very decreased   General Gait Details: 2-3  feet of ambulation from chair to toliet today. Mod A at all times to stablize and prevent leaning and falling. heavy multimodal cueing for sequencing.    Stairs            Wheelchair Mobility    Modified Rankin (Stroke Patients Only)       Balance Overall balance assessment: Needs assistance Sitting-balance support: Feet unsupported;No upper extremity supported Sitting balance-[name] Scale: Fair     Standing balance support: During functional activity;Bilateral upper extremity supported Standing balance-[name] Scale: Poor                              Cognition Arousal/Alertness: Lethargic;Suspect due to medications Behavior During Therapy: [facility] for tasks assessed/performed;Flat affect Overall Cognitive Status: Impaired/Different from baseline Area of Impairment: Following commands;Safety/judgement;Awareness;Memory;Problem solving                     Memory: Decreased short-term memory Following Commands: Follows one step commands inconsistently;Follows one step commands with increased time Safety/Judgement: Decreased awareness of safety Awareness: Emergent Problem Solving: Slow processing General Comments: Pt requiring increased time to follow commands. Noted slow processing and requiring visual cues.      Exercises      General Comments General comments (skin integrity, edema, etc.): Extensive discussion with family today over re-assesment of discharge dispo. They are realizing that patient is requiring alot of assistance at this time and are more open  to SNF.       Pertinent Vitals/Pain Pain Assessment: Faces Faces Pain Scale: Hurts even more Pain Location: L HIP Pain Descriptors / Indicators: Aching;Discomfort;Grimacing;Guarding Pain Intervention(s): Limited  activity within patient's tolerance;Monitored during session    Home Living                      Prior Function            PT Goals (current goals can now be found in the care plan section) Acute Rehab PT Goals Patient Stated Goal: none stated PT Goal Formulation: With patient/family Time For Goal Achievement: 11/02/17 Potential to Achieve Goals: Fair Progress towards PT goals: Progressing toward goals    Frequency    7X/week      PT Plan Discharge plan needs to be updated    Co-evaluation              AM-PAC PT "6 Clicks" Daily Activity  Outcome Measure  Difficulty turning over in bed (including adjusting bedclothes, sheets and blankets)?: Unable Difficulty moving from lying on back to sitting on the side of the bed? : Unable Difficulty sitting down on and standing up from a chair with arms (e.g., wheelchair, bedside commode, etc,.)?: Unable Help needed moving to and from a bed to chair (including a wheelchair)?: A Lot Help needed walking in hospital room?: A Lot Help needed climbing 3-5 steps with a railing? : Total 6 Click Score: 8    End of Session Equipment Utilized During Treatment: Gait belt Activity Tolerance: Patient limited by lethargy Patient left: in chair;with nursing/sitter in room Nurse Communication: Mobility status PT Visit Diagnosis: Unsteadiness on feet (R26.81);Other abnormalities of gait and mobility (R26.89);Muscle weakness (generalized) (M62.81);Pain Pain - Right/Left: Left Pain - part of body: Hip     Time: 1610-9604 PT Time Calculation (min) (ACUTE ONLY): 32 min  Charges:  $Therapeutic Activity: 8-22 mins $Self Care/Home Management: 8-22                    G Codes:      Etta Grandchild, PT, DPT Acute Rehab Services Pager: [phone]     Etta Grandchild 10/27/2017, 4:01 PM

## 2017-10-27 NOTE — Progress Notes (Signed)
PT Cancellation Note  Patient Details Name: Alicia Vang MRN: [id number] DOB: [birth year]   Cancelled Treatment:     attempted to work with patient BID this evening. She is eating currently with family visiting and wishes to resume therapy in the morning. Family states she has been up to bathroom several times since last session with help. Still hopeful for larger strides tomorrow after transfusions today but are accepting of SNF recommendations without marked progression. PT will continue to assess 12/06.   Alicia Vang 10/27/2017, 6:52 PM

## 2017-10-27 NOTE — Evaluation (Signed)
Occupational Therapy Evaluation Patient Details Name: Alicia Vang MRN: [id number] DOB: Apr 17, 1927 Today's Date: 10/27/2017    History of Present Illness 84 y.o. female s/p L THA  1/04. PMH includes:  HTN, DM, Arthritis   Clinical Impression   PTA, pt was living with her daughter (since October) who provided assistance with shower transfers and washing pt's back. Otherwise pt demonstrated modified independence utilizing 4-wheeled walker for functional mobility. She currently requires total assist for toileting hygiene, max assist for LB ADL, min assist for UB ADL, and max assist for toilet transfers at this time. She continues to demonstrate some cognitive deficits requiring increased time for processing and following one-step commands. Pt would benefit from continued OT services while admitted to improve independence with ADL and functional mobility. Pt's daughter works during the day and family unsure if pt will be able to have 24 hour hands on assistance. At current functional level, pt will need significant rehabilitation to return to PLOF. Recommend short-term SNF level rehabilitation post-acute D/C to maximize independence with ADL and functional mobility. Will continue to monitor for progress and update recommendations as appropriate. If pt is to return home she will need 24 hour hands on assistance as well as maximum home health services.     Follow Up Recommendations  SNF;Supervision/Assistance - 24 hour(unless family able to provide 24 hour hands on assist)    Equipment Recommendations  3 in 1 bedside commode    Recommendations for Other Services       Precautions / Restrictions Precautions Precautions: Fall;Anterior Hip Precaution Comments: no abduction, hip extension, ER Restrictions Weight Bearing Restrictions: Yes LLE Weight Bearing: Weight bearing as tolerated      Mobility Bed Mobility Overal bed mobility: Needs Assistance Bed Mobility: Supine to Sit;Sit to  Supine     Supine to sit: Mod assist Sit to supine: Mod assist   General bed mobility comments: Mod assist to scoot hips with chuck pad and manage B LE  Transfers Overall transfer level: Needs assistance Equipment used: Rolling walker (2 wheeled);1 person hand held assist Transfers: Sit to/from [employer] CorporationStand;Stand Pivot Transfers Sit to Stand: Mod assist;Max assist         General transfer comment: Mod assist to rise to standing. Max assist to attempt taking a few steps away from EOB. Required step-by-step cues.     Balance Overall balance assessment: Needs assistance Sitting-balance support: Feet unsupported;No upper extremity supported Sitting balance-[name] Scale: Fair     Standing balance support: During functional activity;Bilateral upper extremity supported Standing balance-[name] Scale: Poor                             ADL either performed or assessed with clinical judgement   ADL Overall ADL's : Needs assistance/impaired Eating/Feeding: Minimal assistance;Sitting   Grooming: Minimal assistance;Sitting   Upper Body Bathing: Minimal assistance;Sitting   Lower Body Bathing: Maximal assistance;Sit to/from stand   Upper Body Dressing : Minimal assistance;Sitting   Lower Body Dressing: Maximal assistance;Sit to/from stand   Toilet Transfer: Moderate assistance;RW;Maximal assistance Toilet Transfer Details (indicate cue type and reason): Mod assist to rise to standing with max assist to take a few steps this session. Did not pivot as RN requested return pt back to bed.  Toileting- Clothing Manipulation and Hygiene: Total assistance;Sit to/from stand       Functional mobility during ADLs: Maximal assistance;Rolling walker General ADL Comments: Pt very fearful of falling.      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      Perception     Praxis      Pertinent Vitals/Pain Pain Assessment: No/denies pain     Hand Dominance     Extremity/Trunk Assessment Upper Extremity Assessment Upper Extremity Assessment: Generalized weakness   Lower Extremity Assessment Lower Extremity Assessment: Generalized weakness;LLE deficits/detail LLE Deficits / Details: decreased strength and ROM as expected post-operatively.        Communication Communication Communication: No difficulties   Cognition Arousal/Alertness: Lethargic;Suspect due to medications Behavior During Therapy: [facility] for tasks assessed/performed;Flat affect Overall Cognitive Status: Impaired/Different from baseline Area of Impairment: Following commands;Safety/judgement;Awareness;Memory;Problem solving                     Memory: Decreased short-term memory Following Commands: Follows one step commands inconsistently;Follows one step commands with increased time Safety/Judgement: Decreased awareness of safety Awareness: Emergent Problem Solving: Slow processing General Comments: Pt requiring increased time to follow commands. Noted slow processing and requiring visual cues.   General Comments       Exercises     Shoulder Instructions      Home Living Family/patient expects to be discharged to:: Private residence Living Arrangements: Children Available Help at Discharge: Family;Available 24 hours/day;Available PRN/intermittently(niece avail 24/7 5 days/week; daughter works during day. ) Type of Home: House Home Access: Stairs to enter [employer] of Steps: 3 Entrance Stairs-Rails: Left Home Layout: Two level;Able to live on main level with bedroom/bathroom;1/2 bath on main level(1/2 bath only on 1st floor)     Bathroom Shower/Tub: Tub only         Home Equipment: Walker - 4  wheels          Prior Functioning/Environment Level of Independence: Independent with assistive device(s);Needs assistance  Gait / Transfers Assistance Needed: ambulating with 4 wheeled walker ADL's / Homemaking Assistance Needed: Assist for shower transfers and washing back but otherwise able to complete ADL.             OT Problem List: Decreased strength;Decreased activity tolerance;Impaired balance (sitting and/or standing);Decreased safety awareness;Decreased knowledge of use of DME or AE;Decreased knowledge of precautions;Pain;Decreased range of motion      OT Treatment/Interventions: Self-care/ADL training;Therapeutic exercise;Energy conservation;DME and/or AE instruction;Therapeutic activities;Patient/family education;Balance training;Cognitive remediation/compensation;Visual/perceptual remediation/compensation    OT Goals(Current goals can be found in the care plan section) Acute Rehab OT Goals Patient Stated Goal: none stated OT Goal Formulation: With patient/family Time For Goal Achievement: 11/10/17 Potential to Achieve Goals: Good ADL Goals Pt Will Perform Grooming: with min assist;standing Pt Will Perform Lower Body Dressing: with min assist;sit to/from stand Pt Will Transfer to Toilet: with mod assist;ambulating;bedside commode(BSC over toilet) Pt Will Perform Toileting - Clothing Manipulation and hygiene: with mod assist;sit to/from stand Pt Will Perform Tub/Shower Transfer: with mod assist;rolling walker;3 in 1;shower seat  OT Frequency: Min 2X/week   Barriers to D/C:            Co-evaluation              AM-PAC PT "6 Clicks" Daily Activity     Outcome Measure Help from another person eating meals?: A Little Help from another person taking care of personal grooming?: A Little Help from another person toileting, which includes using toliet,  bedpan, or urinal?: A Lot Help from another person bathing (including washing, rinsing, drying)?: A Lot Help  from another person to put on and taking off regular upper body clothing?: A Little Help from another person to put on and taking off regular lower body clothing?: A Lot 6 Click Score: 15   End of Session Equipment Utilized During Treatment: Gait belt;Rolling walker Nurse Communication: Mobility status  Activity Tolerance: Patient tolerated treatment well Patient left: in bed;with call bell/phone within reach;with bed alarm set;with family/visitor present(RN requesting return to bed for transfusion)  OT Visit Diagnosis: Other abnormalities of gait and mobility (R26.89);Muscle weakness (generalized) (M62.81);Other symptoms and signs involving cognitive function                Time: 1610-9604 OT Time Calculation (min): 43 min Charges:  OT General Charges $OT Visit: 1 Visit OT Evaluation $OT Eval Moderate Complexity: 1 Mod OT Treatments $Self Care/Home Management : 23-37 mins G-Codes:    Doristine Section, MS OTR/L  Pager: [phone]   Mintie Witherington A Tonnie Stillman 10/27/2017, 11:22 AM

## 2017-10-27 NOTE — Progress Notes (Signed)
Subjective: 1 Day Post-Op Procedure(s) (LRB): TOTAL HIP ARTHROPLASTY ANTERIOR APPROACH (Left)   Patient resting comfortably in bed eating breakfast. Family at bedside. Pain well controlled. He HgB was 7.3 this morning.  Activity level:  wabt Diet tolerance:  ok Voiding:  ok Patient reports pain as mild.    Objective: Vital signs in last 24 hours: Temp:  [97 F (36.1 C)-99.1 F (37.3 C)] 98 F (36.7 C) (01/05 0634) Pulse Rate:  [51-72] 51 (01/05 0634) Resp:  [11-22] 15 (01/05 0634) BP: (113-193)/(51-95) 124/51 (01/05 0634) SpO2:  [96 %-100 %] 96 % (01/05 0634)  Labs: Recent Labs    10/27/17 0539  HGB 7.3*   Recent Labs    10/27/17 0539  WBC 5.4  RBC 2.62*  HCT 22.6*  PLT 164   Recent Labs    10/27/17 0539  NA 132*  K 3.4*  CL 95*  CO2 27  BUN 21*  CREATININE 1.25*  GLUCOSE 295*  CALCIUM 8.1*   No results for input(s): LABPT, INR in the last 72 hours.  Physical Exam:  Neurologically intact ABD soft Neurovascular intact Sensation intact distally Intact pulses distally Dorsiflexion/Plantar flexion intact Incision: dressing C/D/I and no drainage No cellulitis present Compartment soft  Assessment/Plan:  1 Day Post-Op Procedure(s) (LRB): TOTAL HIP ARTHROPLASTY ANTERIOR APPROACH (Left) Advance diet Up with therapy  We will give 2 units of blood this morning as Hgb is 7.3 We will be cautious with pain medication and oversedation.  Likely D/c on Monday. Patient and family would like to go home but we will see how she progresses this weekend. Continue on ASA 325mg  BID for DVT rpevention. Follow up with Dr. Turner Danielsowan as scheduled post op.  Kamyla Olejnik, Ginger OrganNDREW PAUL 10/27/2017, 8:20 AM

## 2017-10-28 LAB — BPAM RBC
BLOOD PRODUCT EXPIRATION DATE: 201902012359
Blood Product Expiration Date: 201902012359
ISSUE DATE / TIME: 201901051636
ISSUE DATE / TIME: 201901051001
Unit Type and Rh: 5100
Unit Type and Rh: 5100

## 2017-10-28 LAB — BASIC METABOLIC PANEL
Anion gap: 10 (ref 5–15)
BUN: 28 mg/dL — ABNORMAL HIGH (ref 6–20)
CALCIUM: 8.5 mg/dL — AB (ref 8.9–10.3)
CO2: 25 mmol/L (ref 22–32)
CREATININE: 0.97 mg/dL (ref 0.44–1.00)
Chloride: 96 mmol/L — ABNORMAL LOW (ref 101–111)
GFR, EST AFRICAN AMERICAN: 59 mL/min — AB (ref >60)
GFR, EST NON AFRICAN AMERICAN: 51 mL/min — AB (ref >60)
Glucose, Bld: 117 mg/dL — ABNORMAL HIGH (ref 65–99)
Potassium: 3.3 mmol/L — ABNORMAL LOW (ref 3.5–5.1)
SODIUM: 131 mmol/L — AB (ref 135–145)

## 2017-10-28 LAB — TYPE AND SCREEN
ABO/RH(D): O POS
Antibody Screen: NEGATIVE
UNIT DIVISION: 0
Unit division: 0

## 2017-10-28 LAB — GLUCOSE, CAPILLARY
GLUCOSE-CAPILLARY: 227 mg/dL — AB (ref 65–99)
Glucose-Capillary: 109 mg/dL — ABNORMAL HIGH (ref 65–99)
Glucose-Capillary: 259 mg/dL — ABNORMAL HIGH (ref 65–99)
Glucose-Capillary: 249 mg/dL — ABNORMAL HIGH (ref 65–99)

## 2017-10-28 LAB — CBC
HCT: 32.1 % — ABNORMAL LOW (ref 36.0–46.0)
Hemoglobin: 10.7 g/dL — ABNORMAL LOW (ref 12.0–15.0)
MCH: 27.9 pg (ref 26.0–34.0)
MCHC: 33.3 g/dL (ref 30.0–36.0)
MCV: 83.6 fL (ref 78.0–100.0)
PLATELETS: 139 10*3/uL — AB (ref 150–400)
RBC: 3.84 MIL/uL — ABNORMAL LOW (ref 3.87–5.11)
RDW: 14.3 % (ref 11.5–15.5)
WBC: 7.8 10*3/uL (ref 4.0–10.5)

## 2017-10-28 NOTE — Progress Notes (Signed)
Rehab Admissions Coordinator Note:  Patient was screened by Trish MageLogue, Sigmund Morera M for appropriateness for an Inpatient Acute Rehab Consult.  At this time, we are recommending Skilled Nursing Facility.  Trish MageLogue, Caysen Whang M 10/28/2017, 1:42 PM  I can be reached at [phone].

## 2017-10-28 NOTE — NC FL2 (Signed)
[place] MEDICAID FL2 LEVEL OF CARE SCREENING TOOL     IDENTIFICATION  Patient Name: Alicia Vang Birthdate: [birth year] Sex: female Admission Date (Current Location): 10/26/2017  [facility] and [place] Number:  Producer, television/film/video and Address:  [facility], [address], [place], [place] [address]      Provider Number: [id number]  Attending Physician Name and Address:  Gean Birchwood, MD  Relative Name and Phone Number:       Current Level of Care: Hospital Recommended Level of Care: Skilled Nursing Facility Prior Approval Number:    Date Approved/Denied:   PASRR Number: [id number]  Discharge Plan: SNF    Current Diagnoses: Patient Active Problem List   Diagnosis Date Noted  . Primary osteoarthritis of left hip 10/26/2017  . Osteoarthritis of left hip 08/17/2017    Orientation RESPIRATION BLADDER Height & Weight     Self, Time, Situation, Place  Normal Continent Weight: 98 lb (44.5 kg) Height:  5\' 5"  (165.1 cm)  BEHAVIORAL SYMPTOMS/MOOD NEUROLOGICAL BOWEL NUTRITION STATUS      Continent (Please see d/c summary)  AMBULATORY STATUS COMMUNICATION OF NEEDS Skin   Limited Assist Verbally Surgical wounds(Closed incision left hip, aquacell dressing)                       Personal Care Assistance Level of Assistance  Bathing, Feeding, Dressing Bathing Assistance: Limited assistance Feeding assistance: Independent Dressing Assistance: Limited assistance     Functional Limitations Info  Sight, Hearing, Speech Sight Info: Adequate Hearing Info: Adequate Speech Info: Adequate    SPECIAL CARE FACTORS FREQUENCY  PT (By licensed PT), OT (By licensed OT)     PT Frequency: 3x OT Frequency: 3x            Contractures Contractures Info: Not present    Additional Factors Info  Code Status, Allergies Code Status Info: Full Code Allergies Info: Amlodipine           Current Medications (10/28/2017):  This is the current  hospital active medication list Current Facility-Administered Medications  Medication Dose Route Frequency Provider Last Rate Last Dose  . acetaminophen (TYLENOL) tablet 650 mg  650 mg Oral Q4H PRN Allena Katz, PA-C   650 mg at 10/26/17 2213   Or  . acetaminophen (TYLENOL) suppository 650 mg  650 mg Rectal Q4H PRN Allena Katz, PA-C      . aspirin EC tablet 325 mg  325 mg Oral BID PC Elodia Florence, PA-C   325 mg at 10/28/17 0836  . bisacodyl (DULCOLAX) EC tablet 5 mg  5 mg Oral Daily PRN Dannielle Burn K, PA-C      . brimonidine (ALPHAGAN) 0.2 % ophthalmic solution 1 drop  1 drop Both Eyes BID Gean Birchwood, MD   1 drop at 10/28/17 0836   And  . timolol (TIMOPTIC) 0.5 % ophthalmic solution 1 drop  1 drop Both Eyes BID Gean Birchwood, MD   1 drop at 10/28/17 0837  . chlorthalidone (HYGROTON) tablet 25 mg  25 mg Oral Daily Allena Katz, PA-C   25 mg at 10/28/17 0835  . cycloSPORINE (RESTASIS) 0.05 % ophthalmic emulsion 1 drop  1 drop Both Eyes BID Allena Katz, PA-C   1 drop at 10/28/17 0836  . dextrose 5 % and 0.45 % NaCl with KCl 20 mEq/L infusion   Intravenous Continuous Dannielle Burn K, PA-C      . diphenhydrAMINE (BENADRYL) 12.5 MG/5ML elixir  12.5-25 mg  12.5-25 mg Oral Q4H PRN Allena Katz, PA-C      . docusate sodium (COLACE) capsule 100 mg  100 mg Oral BID Allena Katz, PA-C   100 mg at 10/28/17 0836  . gabapentin (NEURONTIN) capsule 300 mg  300 mg Oral TID Allena Katz, PA-C   300 mg at 10/28/17 1610  . glimepiride (AMARYL) tablet 4 mg  4 mg Oral Q breakfast Allena Katz, PA-C   4 mg at 10/28/17 9604  . hydrALAZINE (APRESOLINE) tablet 10 mg  10 mg Oral Daily Allena Katz, PA-C   10 mg at 10/28/17 5409  . HYDROmorphone (DILAUDID) injection 0.5 mg  0.5 mg Intravenous Q2H PRN Allena Katz, PA-C      . insulin aspart (novoLOG) injection 0-9 Units  0-9 Units Subcutaneous TID WC Allena Katz, PA-C   5 Units at 10/28/17 1231  . latanoprost (XALATAN)  0.005 % ophthalmic solution 1 drop  1 drop Both Eyes QHS Allena Katz, PA-C   1 drop at 10/27/17 2144  . losartan (COZAAR) tablet 100 mg  100 mg Oral Daily Allena Katz, PA-C   100 mg at 10/28/17 0836  . menthol-cetylpyridinium (CEPACOL) lozenge 3 mg  1 lozenge Oral PRN Allena Katz, PA-C       Or  . phenol (CHLORASEPTIC) mouth spray 1 spray  1 spray Mouth/Throat PRN Allena Katz, PA-C      . methocarbamol (ROBAXIN) tablet 500 mg  500 mg Oral Q6H PRN Allena Katz, PA-C   500 mg at 10/28/17 0836   Or  . methocarbamol (ROBAXIN) 500 mg in dextrose 5 % 50 mL IVPB  500 mg Intravenous Q6H PRN Allena Katz, PA-C      . metoCLOPramide (REGLAN) tablet 5-10 mg  5-10 mg Oral Q8H PRN Allena Katz, PA-C       Or  . metoCLOPramide (REGLAN) injection 5-10 mg  5-10 mg Intravenous Q8H PRN Allena Katz, PA-C      . metoprolol succinate (TOPROL-XL) 24 hr tablet 50 mg  50 mg Oral Daily Gean Birchwood, MD   50 mg at 10/28/17 0835  . ondansetron (ZOFRAN) tablet 4 mg  4 mg Oral Q6H PRN Allena Katz, PA-C       Or  . ondansetron [facility]) injection 4 mg  4 mg Intravenous Q6H PRN Allena Katz, PA-C      . oxyCODONE (Oxy IR/ROXICODONE) immediate release tablet 10 mg  10 mg Oral Q3H PRN Allena Katz, PA-C      . oxyCODONE (Oxy IR/ROXICODONE) immediate release tablet 5 mg  5 mg Oral Q3H PRN Allena Katz, PA-C   5 mg at 10/27/17 1322  . polyethylene glycol (MIRALAX / GLYCOLAX) packet 17 g  17 g Oral Daily PRN Dannielle Burn K, PA-C      . potassium chloride SA (K-DUR,KLOR-CON) CR tablet 20 mEq  20 mEq Oral Daily Gean Birchwood, MD   20 mEq at 10/28/17 0835  . sodium phosphate (FLEET) 7-19 GM/118ML enema 1 enema  1 enema Rectal Once PRN Allena Katz, PA-C      . tranexamic acid (CYKLOKAPRON) 1,000 mg in sodium chloride 0.9 % 100 mL IVPB  1,000 mg Intravenous Once Allena Katz, PA-C         Discharge Medications: Please see discharge summary for a list of discharge  medications.  Relevant Imaging Results:  Relevant Lab Results:   Additional  Information SSN: [id number]  Maree KrabbeBridget A Deadrick Stidd, LCSW

## 2017-10-28 NOTE — Progress Notes (Signed)
Physical Therapy Treatment Patient Details Name: Alicia Vang MRN: [id number] DOB: Apr 05, 1927 Today's Date: 10/28/2017    History of Present Illness 84 y.o. female s/p L THA  1/04. PMH includes:  HTN, DM, Arthritis    PT Comments    Continuing work on functional mobility and activity tolerance;  Notable progress with gait distance and activity tolerance; Very supportive family, participating well; Given progress and motivation, I believe she will be a solid candidate for CIR for post-acute rehabilitation; Her advanced age may help qualify her for CIR; Will place IP Rehab screen  Follow Up Recommendations  CIR;DC plan and follow up therapy as arranged by surgeon;Supervision/Assistance - 24 hour(post-acute rehab)     Equipment Recommendations  Rolling walker with 5" wheels;3in1 (PT)    Recommendations for Other Services OT consult     Precautions / Restrictions Precautions Precautions: Fall;Anterior Hip Precaution Booklet Issued: Yes (comment) Precaution Comments: no abduction, hip extension, ER Restrictions LLE Weight Bearing: Weight bearing as tolerated    Mobility  Bed Mobility               General bed mobility comments: OOB at entry  Transfers Overall transfer level: Needs assistance Equipment used: Rolling walker (2 wheeled) Transfers: Sit to/from Stand Sit to Stand: Mod assist         General transfer comment: Mod assist to rise and stabilize as pt transferred hands to RW  Ambulation/Gait Ambulation/Gait assistance: Mod assist Ambulation Distance (Feet): 25 Feet Assistive device: Rolling walker (2 wheeled) Gait Pattern/deviations: Step-to pattern;Antalgic;Trunk flexed Gait velocity: very decreased   General Gait Details: Cues for gait sequence and step length, as well as posture; chair pushed behind for safety and to boost pt confidence eith amb   Stairs            Wheelchair Mobility    Modified Rankin (Stroke Patients Only)        Balance     Sitting balance-[name] Scale: Fair       Standing balance-[name] Scale: Poor                              Cognition Arousal/Alertness: Awake/alert Behavior During Therapy: WFL for tasks assessed/performed;Flat affect Overall Cognitive Status: Within Functional Limits for tasks assessed(for simple mobility tasks)                               Problem Solving: Slow processing        Exercises Total Joint Exercises Ankle Circles/Pumps: AROM;Both;10 reps Quad Sets: AROM;Both;10 reps Gluteal Sets: AROM;Both;10 reps Towel Squeeze: AROM;Both;10 reps Heel Slides: AAROM;Left;10 reps Hip ABduction/ADduction: AAROM;Left;10 reps;Other (comment)(Isometric )    General Comments General comments (skin integrity, edema, etc.): Discussed dc planning pt, son and daughter-in-law; they are ok with post-acute rehab      Pertinent Vitals/Pain Pain Assessment: Faces Faces Pain Scale: Hurts little more Pain Location: L HIP Pain Descriptors / Indicators: Aching;Discomfort;Grimacing;Guarding Pain Intervention(s): Monitored during session    Home Living                      Prior Function            PT Goals (current goals can now be found in the care plan section) Acute Rehab PT Goals Patient Stated Goal: none stated PT Goal Formulation: With patient/family Time For Goal Achievement: 11/02/17 Potential to Achieve Goals: Fair  Progress towards PT goals: Progressing toward goals    Frequency    7X/week      PT Plan Discharge plan needs to be updated    Co-evaluation              AM-PAC PT "6 Clicks" Daily Activity  Outcome Measure  Difficulty turning over in bed (including adjusting bedclothes, sheets and blankets)?: Unable Difficulty moving from lying on back to sitting on the side of the bed? : A Lot Difficulty sitting down on and standing up from a chair with arms (e.g., wheelchair, bedside commode, etc,.)?:  Unable Help needed moving to and from a bed to chair (including a wheelchair)?: A Lot Help needed walking in hospital room?: A Lot Help needed climbing 3-5 steps with a railing? : Total 6 Click Score: 9    End of Session Equipment Utilized During Treatment: Gait belt Activity Tolerance: Patient tolerated treatment well Patient left: in chair;with family/visitor present Nurse Communication: Mobility status PT Visit Diagnosis: Unsteadiness on feet (R26.81);Other abnormalities of gait and mobility (R26.89);Muscle weakness (generalized) (M62.81);Pain Pain - Right/Left: Left Pain - part of body: Hip     Time: 1610-9604 PT Time Calculation (min) (ACUTE ONLY): 43 min  Charges:  $Gait Training: 23-37 mins $Therapeutic Exercise: 8-22 mins                    G Codes:       Van Clines, PT  Acute Rehabilitation Services Pager [phone] Office [phone]    Levi Aland 10/28/2017, 1:32 PM

## 2017-10-28 NOTE — Clinical Social Work Note (Signed)
Clinical Social Work Assessment  Patient Details  Name: Alicia Vang MRN: [id number] Date of Birth: 1927-04-18  Date of referral:  10/28/17               Reason for consult:  Facility Placement                Permission sought to share information with:  Family Supports Permission granted to share information::  Yes, Verbal Permission Granted  Name::     Occupational hygienistenita  Agency::     Relationship::  Daughter  Contact Information:     Housing/Transportation Living arrangements for the past 2 months:  Single Family Home Source of Information:  Patient Patient Interpreter Needed:  None Criminal Activity/Legal Involvement Pertinent to Current Situation/Hospitalization:  No - Comment as needed Significant Relationships:  Adult Children, Other Family Members Lives with:  Adult Children Do you feel safe going back to the place where you live?    Need for family participation in patient care:     Care giving concerns:  Pt's caregiver is her daughter, Alicia Vang. Alicia Vang has no concerns other than placement at this time.    Social Worker assessment / plan:  CSW spoke with pt at bedside. Pt's daughter and son were at bedside. Pt's daughter is primary caregiver. Pt is agreeable to SNF at d/c. However, pt has been to CIR before and would like to be considered for that. Son states they talked to PT and PT states she should be a canidate. CSW explained the shortage of beds in CIR and the time crunch. The family understood but would still like to keep the options open. As back up the family wanted CSW to look into [place] and [facility]. CSW will f/o. CSW to follow up on 1/7. CSW will provide family with a list--per request.  Employment status:  Retired Health and safety inspectornsurance information:  Medicare PT Recommendations:  Skilled Nursing Facility Information / Referral to community resources:  Skilled Nursing Facility  Patient/Family's Response to care:  Pt verbalized understanding of CSW role and expressed appreciation for  support. Pt denies any concern regarding pt care at this time.   Patient/Family's Understanding of and Emotional Response to Diagnosis, Current Treatment, and Prognosis:  Pt understanding and realistic regarding physical limitations. Pt understands the need for SNF placement at d/c. Pt agreeable to SNF placement at d/c, at this time. Pt's responses emotionally appropriate during conversation with CSW. Pt denies any concern regarding treatment plan at this time. CSW will continue to provide support and facilitate d/c needs.   Emotional Assessment Appearance:  Appears stated age Attitude/Demeanor/Rapport:  Engaged Affect (typically observed):  Accepting, Appropriate, Calm Orientation:  Oriented to Self, Oriented to Place, Oriented to  Time, Oriented to Situation Alcohol / Substance use:  Not Applicable Psych involvement (Current and /or in the community):  No (Comment)  Discharge Needs  Concerns to be addressed:  Care Coordination Readmission within the last 30 days:  No Current discharge risk:  Dependent with Mobility Barriers to Discharge:  Continued Medical Work up   Pacific MutualBridget A Athen Riel, LCSW 10/28/2017, 3:56 PM

## 2017-10-28 NOTE — Progress Notes (Signed)
Subjective: 2 Days Post-Op Procedure(s) (LRB): TOTAL HIP ARTHROPLASTY ANTERIOR APPROACH (Left)   Patient looks better this morning and family states that she seems more liek herself since receiving blood yesterday.   Activity level:  wbat Diet tolerance:  ok Voiding:  ok Patient reports pain as mild.    Objective: Vital signs in last 24 hours: Temp:  [97.5 F (36.4 C)-99.1 F (37.3 C)] 97.9 F (36.6 C) (01/06 0507) Pulse Rate:  [53-73] 60 (01/06 0507) Resp:  [18] 18 (01/06 0507) BP: (90-145)/(50-74) 144/74 (01/06 0507) SpO2:  [97 %-100 %] 99 % (01/06 0507)  Labs: Recent Labs    10/27/17 0539 10/27/17 2019 10/28/17 0623  HGB 7.3* 11.1* 10.7*   Recent Labs    10/27/17 0539 10/27/17 2019 10/28/17 0623  WBC 5.4  --  7.8  RBC 2.62*  --  3.84*  HCT 22.6* 33.0* 32.1*  PLT 164  --  139*   Recent Labs    10/27/17 0539 10/28/17 0623  NA 132* 131*  K 3.4* 3.3*  CL 95* 96*  CO2 27 25  BUN 21* 28*  CREATININE 1.25* 0.97  GLUCOSE 295* 117*  CALCIUM 8.1* 8.5*   No results for input(s): LABPT, INR in the last 72 hours.  Physical Exam:  Neurologically intact ABD soft Neurovascular intact Sensation intact distally Intact pulses distally Dorsiflexion/Plantar flexion intact Incision: dressing C/D/I and no drainage No cellulitis present Compartment soft  Assessment/Plan:  2 Days Post-Op Procedure(s) (LRB): TOTAL HIP ARTHROPLASTY ANTERIOR APPROACH (Left) Advance diet Up with therapy Plan for discharge tomorrow either home or possibly SNF if she has not made progress with PT. Patient looks much better since receiving blood yesterday. Continue on ASA 325mg  BID for DVT prevention. Follow up with Dr. Turner Danielsowan as scheduled in office post op.  Dondrell Loudermilk, Ginger OrganNDREW PAUL 10/28/2017, 8:32 AM

## 2017-10-29 ENCOUNTER — Encounter (HOSPITAL_COMMUNITY): Payer: Self-pay | Admitting: Orthopedic Surgery

## 2017-10-29 LAB — GLUCOSE, CAPILLARY
GLUCOSE-CAPILLARY: 207 mg/dL — AB (ref 65–99)
Glucose-Capillary: 173 mg/dL — ABNORMAL HIGH (ref 65–99)
Glucose-Capillary: 170 mg/dL — ABNORMAL HIGH (ref 65–99)

## 2017-10-29 LAB — CBC
HEMATOCRIT: 29.6 % — AB (ref 36.0–46.0)
Hemoglobin: 9.4 g/dL — ABNORMAL LOW (ref 12.0–15.0)
MCH: 27.4 pg (ref 26.0–34.0)
MCHC: 31.8 g/dL (ref 30.0–36.0)
MCV: 86.3 fL (ref 78.0–100.0)
Platelets: 139 10*3/uL — ABNORMAL LOW (ref 150–400)
RBC: 3.43 MIL/uL — AB (ref 3.87–5.11)
RDW: 14.5 % (ref 11.5–15.5)
WBC: 6 10*3/uL (ref 4.0–10.5)

## 2017-10-29 NOTE — Discharge Summary (Signed)
Patient ID: Alicia Vang MRN: [id number] DOB/AGE: Jan 21, 1927 84 y.o.  Admit date: 10/26/2017 Discharge date: 10/29/2017  Admission Diagnoses:  Active Problems:   Primary osteoarthritis of left hip   Discharge Diagnoses:  Same  Past Medical History:  Diagnosis Date  . Arthritis   . Diabetes mellitus without complication (HCC)    Type II  . Hypertension   . Hypokalemia     Surgeries: Procedure(s): TOTAL HIP ARTHROPLASTY ANTERIOR APPROACH on 10/26/2017   Consultants:   Discharged Condition: Improved  Hospital Course: Alicia Vang is an 84 y.o. female who was admitted 10/26/2017 for operative treatment of<principal problem not specified>. Patient has severe unremitting pain that affects sleep, daily activities, and work/hobbies. After pre-op clearance the patient was taken to the operating room on 10/26/2017 and underwent  Procedure(s): TOTAL HIP ARTHROPLASTY ANTERIOR APPROACH.    Patient was given perioperative antibiotics:  Anti-infectives (From admission, onward)   Start     Dose/Rate Route Frequency Ordered Stop   10/26/17 0845  ceFAZolin (ANCEF) IVPB 2g/100 mL premix     2 g 200 mL/hr over 30 Minutes Intravenous To [facility] 10/25/17 1232 10/26/17 1015       Patient was given sequential compression devices, early ambulation, and chemoprophylaxis to prevent DVT.  Patient benefited maximally from hospital stay and there were no complications.    Recent vital signs:  Patient Vitals for the past 24 hrs:  BP Temp Temp src Pulse Resp SpO2  10/29/17 0637 (!) 118/52 97.8 F (36.6 C) Axillary 62 12 96 %  10/28/17 2059 (!) 144/62 98.8 F (37.1 C) Axillary 60 16 99 %  10/28/17 1300 124/66 99.8 F (37.7 C) Oral (!) 54 15 95 %     Recent laboratory studies:  Recent Labs    10/27/17 0539 10/27/17 2019 10/28/17 0623  WBC 5.4  --  7.8  HGB 7.3* 11.1* 10.7*  HCT 22.6* 33.0* 32.1*  PLT 164  --  139*  NA 132*  --  131*  K 3.4*  --  3.3*  CL 95*  --   96*  CO2 27  --  25  BUN 21*  --  28*  CREATININE 1.25*  --  0.97  GLUCOSE 295*  --  117*  CALCIUM 8.1*  --  8.5*     Discharge Medications:   Allergies as of 10/29/2017      Reactions   Amlodipine Swelling   Swelling in feet      Medication List    TAKE these medications   aspirin EC 325 MG tablet Take 1 tablet (325 mg total) by mouth 2 (two) times daily.   chlorthalidone 25 MG tablet Commonly known as:  HYGROTON Take 25 mg by mouth daily.   CINNAMON PO Take 1 tablet by mouth daily.   COMBIGAN 0.2-0.5 % ophthalmic solution Generic drug:  brimonidine-timolol Place 1 drop into both eyes 2 (two) times daily.   cycloSPORINE 0.05 % ophthalmic emulsion Commonly known as:  RESTASIS Place 1 drop into both eyes 2 (two) times daily.   glimepiride 2 MG tablet Commonly known as:  AMARYL Take 4 mg by mouth daily with breakfast.   hydrALAZINE 10 MG tablet Commonly known as:  APRESOLINE Take 10 mg by mouth daily.   hydrochlorothiazide 25 MG tablet Commonly known as:  HYDRODIURIL Take 1 tablet (25 mg total) by mouth daily.   latanoprost 0.005 % ophthalmic solution Commonly known as:  XALATAN Place 1 drop into both eyes at bedtime.  losartan 100 MG tablet Commonly known as:  COZAAR Take 100 mg by mouth daily.   metFORMIN 500 MG tablet Commonly known as:  GLUCOPHAGE Take by mouth once.   Metoprolol Succinate 50 MG Cs24 Take 50 mg by mouth daily.   oxyCODONE-acetaminophen 5-325 MG tablet Commonly known as:  ROXICET Take 1 tablet by mouth every 4 (four) hours as needed.   Potassium Chloride ER 20 MEQ Tbcr Take 20 mEq by mouth daily.   PRESERVISION AREDS 2 PO Take by mouth daily.   tiZANidine 2 MG tablet Commonly known as:  ZANAFLEX Take 1 tablet (2 mg total) by mouth every 6 (six) hours as needed for muscle spasms.   traMADol 50 MG tablet Commonly known as:  ULTRAM Take 50 mg by mouth every 6 (six) hours as needed for moderate pain or severe pain.             Durable Medical Equipment  (From admission, onward)        Start     Ordered   10/26/17 1558  DME Walker rolling  Once    Question:  Patient needs a walker to treat with the following condition  Answer:  Status post total hip replacement, left   10/26/17 1557   10/26/17 1558  DME 3 n 1  Once     10/26/17 1557   10/26/17 1558  DME Bedside commode  Once    Question:  Patient needs a bedside commode to treat with the following condition  Answer:  Status post total hip replacement, left   10/26/17 1557      Diagnostic Studies: Dg C-arm 1-60 Min  Result Date: 10/26/2017 CLINICAL DATA:  Left hip replacement EXAM: DG C-ARM 61-120 MIN; OPERATIVE LEFT HIP WITH PELVIS COMPARISON:  None. FINDINGS: Changes of left hip replacement. Normal AP alignment. No hardware or bony complicating feature noted. Questionable lucency and expansion of the left inferior pubic ramus laterally. IMPRESSION: Left hip replacement.  No hardware complicating feature. Question lucency and expansion of the left lateral inferior pubic ramus. Recommend left hip series and pelvis, non portable when the patient is able. Electronically Signed   By: Charlett Nose M.D.   On: 10/26/2017 11:39   Dg Hip Operative Unilat W Or W/o Pelvis Left  Result Date: 10/26/2017 CLINICAL DATA:  Left hip replacement EXAM: DG C-ARM 61-120 MIN; OPERATIVE LEFT HIP WITH PELVIS COMPARISON:  None. FINDINGS: Changes of left hip replacement. Normal AP alignment. No hardware or bony complicating feature noted. Questionable lucency and expansion of the left inferior pubic ramus laterally. IMPRESSION: Left hip replacement.  No hardware complicating feature. Question lucency and expansion of the left lateral inferior pubic ramus. Recommend left hip series and pelvis, non portable when the patient is able. Electronically Signed   By: Charlett Nose M.D.   On: 10/26/2017 11:39    Disposition: 01-Home or Self Care  Discharge Instructions    Call MD / Call 911    Complete by:  As directed    If you experience chest pain or shortness of breath, CALL 911 and be transported to the hospital emergency room.  If you develope a fever above 101 F, pus (white drainage) or increased drainage or redness at the wound, or calf pain, call your surgeon's office.   Constipation Prevention   Complete by:  As directed    Drink plenty of fluids.  Prune juice may be helpful.  You may use a stool softener, such as Colace (over the counter)  100 mg twice a day.  Use MiraLax (over the counter) for constipation as needed.   Diet - low sodium heart healthy   Complete by:  As directed    Driving restrictions   Complete by:  As directed    No driving for 2 weeks   Follow the hip precautions as taught in Physical Therapy   Complete by:  As directed    Increase activity slowly as tolerated   Complete by:  As directed    Patient may shower   Complete by:  As directed    You may shower without a dressing once there is no drainage.  Do not wash over the wound.  If drainage remains, cover wound with plastic wrap and then shower.      Follow-up Information    Gean Birchwoodowan, Frank, MD Follow up in 2 week(s).   Specialty:  Orthopedic Surgery Contact information: [address] [place] [address] [phone]            Signed: Dannielle Burnric Phillips 10/29/2017, 9:57 AM

## 2017-10-29 NOTE — Anesthesia Postprocedure Evaluation (Signed)
Anesthesia Post Note  Patient: Alicia Vang  Procedure(s) Performed: TOTAL HIP ARTHROPLASTY ANTERIOR APPROACH (Left )     Patient location during evaluation: PACU Anesthesia Type: MAC and Spinal Level of consciousness: awake and alert Pain management: pain level controlled Vital Signs Assessment: post-procedure vital signs reviewed and stable Respiratory status: spontaneous breathing, nonlabored ventilation, respiratory function stable and patient connected to nasal cannula oxygen Cardiovascular status: stable and blood pressure returned to baseline Postop Assessment: no apparent nausea or vomiting and spinal receding Anesthetic complications: no    Last Vitals:  Vitals:   10/28/17 2059 10/29/17 0637  BP: (!) 144/62 (!) 118/52  Pulse: 60 62  Resp: 16 12  Temp: 37.1 C 36.6 C  SpO2: 99% 96%    Last Pain:  Vitals:   10/29/17 0637  TempSrc: Axillary  PainSc:                  Saafir Abdullah

## 2017-10-29 NOTE — Plan of Care (Signed)
  Progressing Education: Knowledge of General Education information will improve 10/29/2017 1216 - Progressing by Darreld Mcleanox, Darcel Zick, RN Health Behavior/Discharge Planning: Ability to manage health-related needs will improve 10/29/2017 1216 - Progressing by Darreld Mcleanox, Wynston Romey, RN Clinical Measurements: Ability to maintain clinical measurements within normal limits will improve 10/29/2017 1216 - Progressing by Darreld Mcleanox, Jone Panebianco, RN Will remain free from infection 10/29/2017 1216 - Progressing by Darreld Mcleanox, Emslee Lopezmartinez, RN Diagnostic test results will improve 10/29/2017 1216 - Progressing by Darreld Mcleanox, Arren Laminack, RN Respiratory complications will improve 10/29/2017 1216 - Progressing by Darreld Mcleanox, Vernal Hritz, RN Cardiovascular complication will be avoided 10/29/2017 1216 - Progressing by Darreld Mcleanox, Roran Wegner, RN Activity: Risk for activity intolerance will decrease 10/29/2017 1216 - Progressing by Darreld Mcleanox, Ravon Mcilhenny, RN Nutrition: Adequate nutrition will be maintained 10/29/2017 1216 - Progressing by Darreld Mcleanox, Roniyah Llorens, RN Coping: Level of anxiety will decrease 10/29/2017 1216 - Progressing by Darreld Mcleanox, Jemiah Ellenburg, RN Elimination: Will not experience complications related to bowel motility 10/29/2017 1216 - Progressing by Darreld Mcleanox, Leyton Brownlee, RN Will not experience complications related to urinary retention 10/29/2017 1216 - Progressing by Darreld Mcleanox, Landri Dorsainvil, RN Pain Managment: General experience of comfort will improve 10/29/2017 1216 - Progressing by Darreld Mcleanox, Halil Rentz, RN Safety: Ability to remain free from injury will improve 10/29/2017 1216 - Progressing by Darreld Mcleanox, Xitlally Mooneyham, RN Skin Integrity: Risk for impaired skin integrity will decrease 10/29/2017 1216 - Progressing by Darreld Mcleanox, Isobelle Tuckett, RN Education: Knowledge of the prescribed therapeutic regimen will improve 10/29/2017 1216 - Progressing by Darreld Mcleanox, Ludwig Tugwell, RN Understanding of discharge needs will improve 10/29/2017 1216 - Progressing by Darreld Mcleanox, Jennise Both, RN Activity: Ability to avoid complications of mobility impairment will improve 10/29/2017 1216 - Progressing by Darreld Mcleanox, Colie Fugitt, RN Ability to tolerate  increased activity will improve 10/29/2017 1216 - Progressing by Darreld Mcleanox, Louan Base, RN Clinical Measurements: Postoperative complications will be avoided or minimized 10/29/2017 1216 - Progressing by Darreld Mcleanox, Taffie Eckmann, RN Pain Management: Pain level will decrease with appropriate interventions 10/29/2017 1216 - Progressing by Darreld Mcleanox, Glennette Galster, RN Skin Integrity: Signs of wound healing will improve 10/29/2017 1216 - Progressing by Darreld Mcleanox, Meko Bellanger, RN

## 2017-10-29 NOTE — Social Work (Signed)
CSw received call from [facility] in [facility] indicating that she has spoken to daughter and can offer a bed today semi private and can move patient tomorrow to private room.  CSW advised Renee RNCM of Ortho office and sent [facility] summary [facility].  CSW will f/u for dc today.  Keene BreathPatricia Siriyah Ambrosius, LCSW Clinical Social Worker [phone]

## 2017-10-29 NOTE — Progress Notes (Signed)
Physical Therapy Treatment Patient Details Name: Alicia Vang MRN: [id number] DOB: [birth year] Today's Date: 10/29/2017    History of Present Illness 84 y.o. female s/p L THA  1/04. PMH includes:  HTN, DM, Arthritis    PT Comments    Continuing work on functional mobility and activity tolerance, with notable improvements in transfers and gait distance; making slow, but steady progress; Noted CIR is not an option for post-acute rehab, so have updated rec to SNF for post-acute rehab to maximize independence and safety with mobility    Follow Up Recommendations  SNF;DC plan and follow up therapy as arranged by surgeon     Equipment Recommendations  Rolling walker with 5" wheels;3in1 (PT)    Recommendations for Other Services OT consult     Precautions / Restrictions Precautions Precautions: Fall;Anterior Hip Precaution Booklet Issued: Yes (comment) Precaution Comments: no abduction, hip extension, ER Restrictions LLE Weight Bearing: Weight bearing as tolerated    Mobility  Bed Mobility Overal bed mobility: Needs Assistance Bed Mobility: Supine to Sit     Supine to sit: Mod assist     General bed mobility comments: Heavy mod assist to scoot to EOB and elevate trunk to sit; step-by-step cues for technqiue  Transfers Overall transfer level: Needs assistance Equipment used: Rolling walker (2 wheeled) Transfers: Sit to/from Stand Sit to Stand: Mod assist         General transfer comment: Mod assist to rise and stabilize as pt transferred hands to RW  Ambulation/Gait Ambulation/Gait assistance: Mod assist;+2 safety/equipment Ambulation Distance (Feet): 30 Feet Assistive device: Rolling walker (2 wheeled) Gait Pattern/deviations: Step-to pattern;Antalgic;Trunk flexed Gait velocity: very decreased   General Gait Details: Cues for gait sequence and step length, as well as posture; chair pushed behind for safety and to boost pt confidence eith amb   Stairs             Wheelchair Mobility    Modified Rankin (Stroke Patients Only)       Balance     Sitting balance-[name] Scale: Fair       Standing balance-[name] Scale: Poor                              Cognition Arousal/Alertness: Awake/alert Behavior During Therapy: WFL for tasks assessed/performed Overall Cognitive Status: Within Functional Limits for tasks assessed(for simple mobility tasks)                                 General Comments: Benefits from visual targets for step length      Exercises      General Comments        Pertinent Vitals/Pain Pain Assessment: No/denies pain Faces Pain Scale: Hurts little more Pain Location: L HIP Pain Descriptors / Indicators: Grimacing Pain Intervention(s): Monitored during session    Home Living                      Prior Function            PT Goals (current goals can now be found in the care plan section) Acute Rehab PT Goals Patient Stated Goal: none stated PT Goal Formulation: With patient/family Time For Goal Achievement: 11/02/17 Potential to Achieve Goals: Fair Progress towards PT goals: Progressing toward goals(/slowly)    Frequency    7X/week      PT Plan Discharge plan needs  to be updated    Co-evaluation              AM-PAC PT "6 Clicks" Daily Activity  Outcome Measure  Difficulty turning over in bed (including adjusting bedclothes, sheets and blankets)?: Unable Difficulty moving from lying on back to sitting on the side of the bed? : A Lot Difficulty sitting down on and standing up from a chair with arms (e.g., wheelchair, bedside commode, etc,.)?: Unable Help needed moving to and from a bed to chair (including a wheelchair)?: A Lot Help needed walking in hospital room?: A Lot Help needed climbing 3-5 steps with a railing? : A Lot 6 Click Score: 10    End of Session Equipment Utilized During Treatment: Gait belt Activity Tolerance: Patient  tolerated treatment well Patient left: in chair;with call bell/phone within reach Nurse Communication: Mobility status(and pt voided on BSC during session) PT Visit Diagnosis: Unsteadiness on feet (R26.81);Other abnormalities of gait and mobility (R26.89);Muscle weakness (generalized) (M62.81);Pain Pain - Right/Left: Left Pain - part of body: Hip     Time: [id number] PT Time Calculation (min) (ACUTE ONLY): 33 min  Charges:  $Gait Training: 8-22 mins $Therapeutic Activity: 8-22 mins                    G Codes:       Van ClinesHolly Gaylia Kassel, PT  Acute Rehabilitation Services Pager [phone] Office [phone]    Levi AlandHolly H Ivan Lacher 10/29/2017, 11:27 AM

## 2017-10-29 NOTE — Clinical Social Work Placement (Signed)
   CLINICAL SOCIAL WORK PLACEMENT  NOTE  Date:  10/29/2017  Patient Details  Name: Alicia Vang MRN: [id number] Date of Birth: 01/28/1927  Clinical Social Work is seeking post-discharge placement for this patient at the Skilled  Nursing Facility level of care (*CSW will initial, date and re-position this form in  chart as items are completed):  Yes   Patient/family provided with [place] Clinical Social Work Department's list of facilities offering this level of care within the geographic area requested by the patient (or if unable, by the patient's family).  Yes   Patient/family informed of their freedom to choose among providers that offer the needed level of care, that participate in Medicare, Medicaid or managed care program needed by the patient, have an available bed and are willing to accept the patient.  Yes   Patient/family informed of [place] [facility]'s ownership interest in [facility] and [facility], as well as of the fact that they are under no obligation to receive care at these facilities.  PASRR submitted to EDS on       PASRR number received on 10/28/17     Existing PASRR number confirmed on       FL2 transmitted to all facilities in geographic area requested by pt/family on 10/28/17     FL2 transmitted to all facilities within larger geographic area on       Patient informed that his/her managed care company has contracts with or will negotiate with certain facilities, including the following:        Yes   Patient/family informed of bed offers received.  [facility]     Physician recommends and patient chooses bed at      [facility] on 10/29/17.  Patient to be transferred to facility by [facility]     Patient family notified on 10/29/17 of transfer.  Name of family member notified:  daughter [name] contacted     PHYSICIAN       Additional Comment:     _______________________________________________ Tresa MoorePatricia V Daelyn Pettaway, LCSW 10/29/2017, 1:37 PM

## 2017-10-29 NOTE — Progress Notes (Signed)
PATIENT ID: Alicia Vang  MRN: [id number]  DOB/AGE:  1927-05-13 / 84 y.o.  3 Days Post-Op Procedure(s) (LRB): TOTAL HIP ARTHROPLASTY ANTERIOR APPROACH (Left)    PROGRESS NOTE Subjective: Patient is alert, oriented, no Nausea, no Vomiting, yes passing gas, . Taking PO well. Denies SOB, Chest or Calf Pain. Using Incentive Spirometer, PAS in place. Ambulate WBAT with pt walking 25 ft with therapy Patient reports pain as  8/10  .    Objective: Vital signs in last 24 hours: Vitals:   10/28/17 0507 10/28/17 1300 10/28/17 2059 10/29/17 0637  BP: (!) 144/74 124/66 (!) 144/62 (!) 118/52  Pulse: 60 (!) 54 60 62  Resp: 18 15 16 12   Temp: 97.9 F (36.6 C) 99.8 F (37.7 C) 98.8 F (37.1 C) 97.8 F (36.6 C)  TempSrc: Axillary Oral Axillary Axillary  SpO2: 99% 95% 99% 96%  Weight:      Height:          Intake/Output from previous day: I/O last 3 completed shifts: In: 446.7 [Blood:446.7] Out: -    Intake/Output this shift: Total I/O In: 240 [P.O.:240] Out: -    LABORATORY DATA: Recent Labs    10/27/17 0539  10/27/17 2019  10/28/17 0623  10/28/17 1633 10/28/17 2038 10/29/17 0634  WBC 5.4  --   --   --  7.8  --   --   --   --   HGB 7.3*  --  11.1*  --  10.7*  --   --   --   --   HCT 22.6*  --  33.0*  --  32.1*  --   --   --   --   PLT 164  --   --   --  139*  --   --   --   --   NA 132*  --   --   --  131*  --   --   --   --   K 3.4*  --   --   --  3.3*  --   --   --   --   CL 95*  --   --   --  96*  --   --   --   --   CO2 27  --   --   --  25  --   --   --   --   BUN 21*  --   --   --  28*  --   --   --   --   CREATININE 1.25*  --   --   --  0.97  --   --   --   --   GLUCOSE 295*  --   --   --  117*  --   --   --   --   GLUCAP  --    < >  --    < >  --    < > 227* 249* 173*  CALCIUM 8.1*  --   --   --  8.5*  --   --   --   --    < > = values in this interval not displayed.    Examination: Neurologically intact Neurovascular intact Sensation intact  distally Intact pulses distally Dorsiflexion/Plantar flexion intact Incision: dressing C/D/I and no drainage No cellulitis present Compartment soft} XR AP&Lat of hip shows well placed\fixed THA  Assessment:   3 Days Post-Op Procedure(s) (LRB): TOTAL HIP  ARTHROPLASTY ANTERIOR APPROACH (Left) ADDITIONAL DIAGNOSIS:  Expected Acute Blood Loss Anemia,   Plan: PT/OT WBAT, THA  DVT Prophylaxis: SCDx72 hrs, ASA 325 mg BID x 2 weeks  DISCHARGE PLAN: Skilled Nursing Facility/Rehab  DISCHARGE NEEDS: HHPT, Walker and 3-in-1 comode seat

## 2017-10-29 NOTE — Social Work (Signed)
Clinical Social Worker facilitated patient discharge including contacting patient family and facility to confirm patient discharge plans.  Clinical information faxed to facility and family agreeable with plan.    CSW arranged ambulance transport via [facility] to [facility].    RN to call [phone] to give report prior to discharge.  Clinical Social Worker will sign off for now as social work intervention is no longer needed. Please consult us again if new need arises.  Marnie Fazzino, LCSW Clinical Social Worker [phone]      

## 2020-10-20 ENCOUNTER — Emergency Department (HOSPITAL_COMMUNITY): Payer: Medicare PPO

## 2020-10-20 ENCOUNTER — Encounter (HOSPITAL_COMMUNITY): Payer: Self-pay | Admitting: *Deleted

## 2020-10-20 ENCOUNTER — Other Ambulatory Visit: Payer: Self-pay

## 2020-10-20 ENCOUNTER — Inpatient Hospital Stay (HOSPITAL_COMMUNITY)
Admission: EM | Admit: 2020-10-20 | Discharge: 2020-10-24 | DRG: 177 | Disposition: A | Discharge status: Home Health | Payer: Medicare PPO | Attending: Internal Medicine | Admitting: Internal Medicine

## 2020-10-20 DIAGNOSIS — R4701 Aphasia: Secondary | ICD-10-CM | POA: Diagnosis present

## 2020-10-20 DIAGNOSIS — E119 Type 2 diabetes mellitus without complications: Secondary | ICD-10-CM | POA: Diagnosis present

## 2020-10-20 DIAGNOSIS — E860 Dehydration: Secondary | ICD-10-CM | POA: Diagnosis present

## 2020-10-20 DIAGNOSIS — Z888 Allergy status to other drugs, medicaments and biological substances status: Secondary | ICD-10-CM | POA: Diagnosis not present

## 2020-10-20 DIAGNOSIS — Z79899 Other long term (current) drug therapy: Secondary | ICD-10-CM

## 2020-10-20 DIAGNOSIS — Z7984 Long term (current) use of oral hypoglycemic drugs: Secondary | ICD-10-CM

## 2020-10-20 DIAGNOSIS — E876 Hypokalemia: Secondary | ICD-10-CM | POA: Diagnosis present

## 2020-10-20 DIAGNOSIS — Z79891 Long term (current) use of opiate analgesic: Secondary | ICD-10-CM | POA: Diagnosis not present

## 2020-10-20 DIAGNOSIS — U071 COVID-19: Secondary | ICD-10-CM

## 2020-10-20 DIAGNOSIS — R001 Bradycardia, unspecified: Secondary | ICD-10-CM | POA: Diagnosis present

## 2020-10-20 DIAGNOSIS — D638 Anemia in other chronic diseases classified elsewhere: Secondary | ICD-10-CM | POA: Diagnosis present

## 2020-10-20 DIAGNOSIS — H9190 Unspecified hearing loss, unspecified ear: Secondary | ICD-10-CM | POA: Diagnosis present

## 2020-10-20 DIAGNOSIS — G9341 Metabolic encephalopathy: Secondary | ICD-10-CM | POA: Diagnosis present

## 2020-10-20 DIAGNOSIS — R462 Strange and inexplicable behavior: Secondary | ICD-10-CM

## 2020-10-20 DIAGNOSIS — Z7982 Long term (current) use of aspirin: Secondary | ICD-10-CM

## 2020-10-20 DIAGNOSIS — I10 Essential (primary) hypertension: Secondary | ICD-10-CM | POA: Diagnosis present

## 2020-10-20 DIAGNOSIS — G9340 Encephalopathy, unspecified: Secondary | ICD-10-CM | POA: Diagnosis present

## 2020-10-20 DIAGNOSIS — Z96642 Presence of left artificial hip joint: Secondary | ICD-10-CM | POA: Diagnosis present

## 2020-10-20 DIAGNOSIS — Z90710 Acquired absence of both cervix and uterus: Secondary | ICD-10-CM

## 2020-10-20 DIAGNOSIS — N179 Acute kidney failure, unspecified: Secondary | ICD-10-CM | POA: Diagnosis present

## 2020-10-20 DIAGNOSIS — R059 Cough, unspecified: Secondary | ICD-10-CM

## 2020-10-20 DIAGNOSIS — F05 Delirium due to known physiological condition: Secondary | ICD-10-CM | POA: Diagnosis present

## 2020-10-20 DIAGNOSIS — R4182 Altered mental status, unspecified: Secondary | ICD-10-CM

## 2020-10-20 DIAGNOSIS — R400 Somnolence: Secondary | ICD-10-CM

## 2020-10-20 LAB — COMPREHENSIVE METABOLIC PANEL
ALT: 16 U/L (ref 0–44)
AST: 24 U/L (ref 15–41)
Albumin: 3.9 g/dL (ref 3.5–5.0)
Alkaline Phosphatase: 42 U/L (ref 38–126)
Anion gap: 13 (ref 5–15)
BUN: 23 mg/dL (ref 8–23)
CO2: 25 mmol/L (ref 22–32)
Calcium: 9.6 mg/dL (ref 8.9–10.3)
Chloride: 99 mmol/L (ref 98–111)
Creatinine, Ser: 1.36 mg/dL — ABNORMAL HIGH (ref 0.44–1.00)
GFR, Estimated: 37 mL/min — ABNORMAL LOW (ref >60)
Glucose, Bld: 174 mg/dL — ABNORMAL HIGH (ref 70–99)
Potassium: 3 mmol/L — ABNORMAL LOW (ref 3.5–5.1)
Sodium: 137 mmol/L (ref 135–145)
Total Bilirubin: 0.4 mg/dL (ref 0.3–1.2)
Total Protein: 7.2 g/dL (ref 6.5–8.1)

## 2020-10-20 LAB — URINALYSIS, ROUTINE W REFLEX MICROSCOPIC
Bilirubin Urine: NEGATIVE
Glucose, UA: NEGATIVE mg/dL
Ketones, ur: NEGATIVE mg/dL
Leukocytes,Ua: NEGATIVE
Nitrite: NEGATIVE
Protein, ur: NEGATIVE mg/dL
Specific Gravity, Urine: 1.009 (ref 1.005–1.030)
pH: 5 (ref 5.0–8.0)

## 2020-10-20 LAB — LACTIC ACID, PLASMA: Lactic Acid, Venous: 1.7 mmol/L (ref 0.5–1.9)

## 2020-10-20 LAB — CBC WITH DIFFERENTIAL/PLATELET
Abs Immature Granulocytes: 0.01 10*3/uL (ref 0.00–0.07)
Basophils Absolute: 0 10*3/uL (ref 0.0–0.1)
Basophils Relative: 0 %
Eosinophils Absolute: 0 10*3/uL (ref 0.0–0.5)
Eosinophils Relative: 0 %
HCT: 36.4 % (ref 36.0–46.0)
Hemoglobin: 11.4 g/dL — ABNORMAL LOW (ref 12.0–15.0)
Immature Granulocytes: 0 %
Lymphocytes Relative: 10 %
Lymphs Abs: 0.3 10*3/uL — ABNORMAL LOW (ref 0.7–4.0)
MCH: 28.6 pg (ref 26.0–34.0)
MCHC: 31.3 g/dL (ref 30.0–36.0)
MCV: 91.5 fL (ref 80.0–100.0)
Monocytes Absolute: 0.5 10*3/uL (ref 0.1–1.0)
Monocytes Relative: 14 %
Neutro Abs: 2.7 10*3/uL (ref 1.7–7.7)
Neutrophils Relative %: 76 %
Platelets: 161 10*3/uL (ref 150–400)
RBC: 3.98 MIL/uL (ref 3.87–5.11)
RDW: 13.4 % (ref 11.5–15.5)
WBC: 3.6 10*3/uL — ABNORMAL LOW (ref 4.0–10.5)
nRBC: 0 % (ref 0.0–0.2)

## 2020-10-20 LAB — AMMONIA: Ammonia: 31 umol/L (ref 9–35)

## 2020-10-20 LAB — PROTIME-INR
INR: 1.1 (ref 0.8–1.2)
Prothrombin Time: 14.2 seconds (ref 11.4–15.2)

## 2020-10-20 LAB — POC SARS CORONAVIRUS 2 AG -  ED: SARS Coronavirus 2 Ag: POSITIVE — AB

## 2020-10-20 IMAGING — CT CT HEAD W/O CM
3 series · 15 of 47 positions shown, 18 images · non-contrast
Comparison: None.

CLINICAL DATA: Altered mental status.

EXAM:
CT HEAD WITHOUT CONTRAST
TECHNIQUE: Contiguous axial images were obtained from the base of the skull
through the vertex without intravenous contrast.

[Series 3: head 5.0 h30s · axial · 0.44mm/px · z∈[-112,+18]mm · 9 of 32 slices shown, 12 images]
[im 3/32  brain]
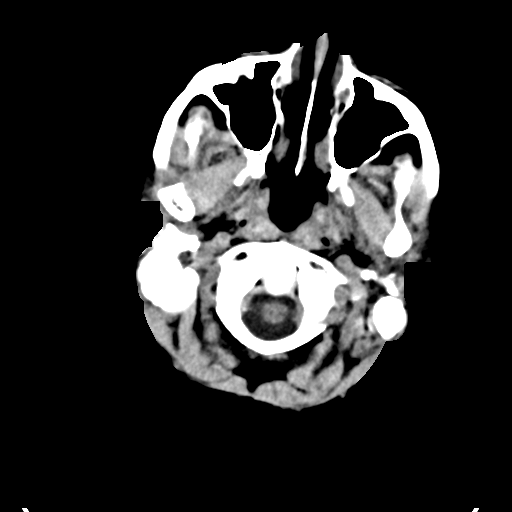
[im 3/32  bone]
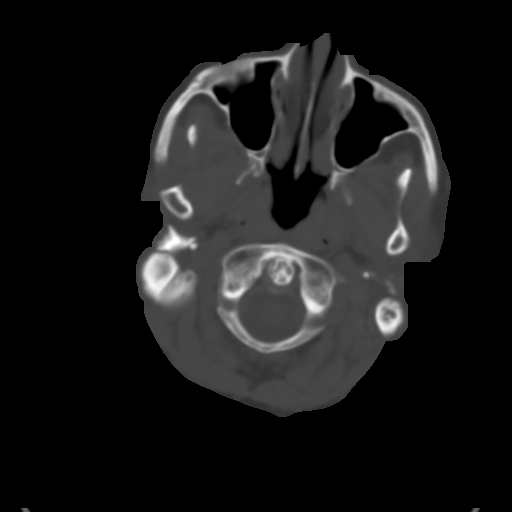
[im 6/32  brain]
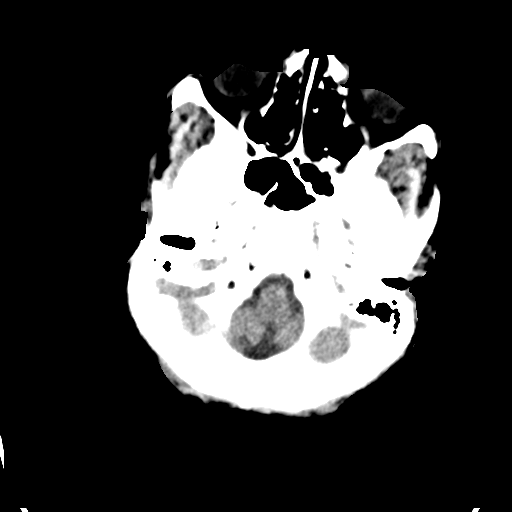
[im 9/32  brain]
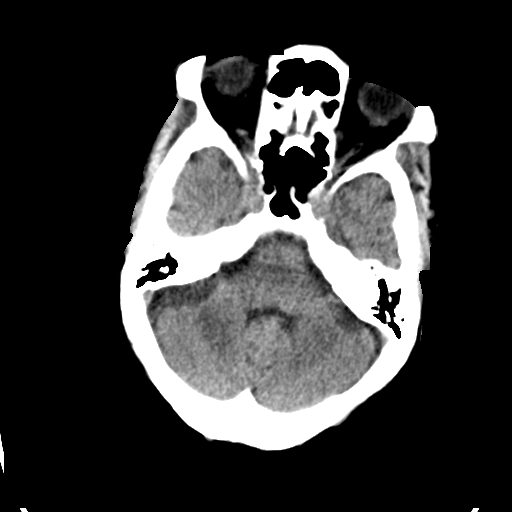
[im 12/32  brain]
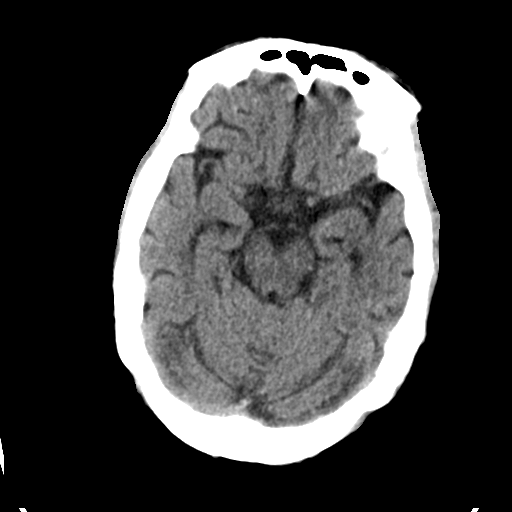
[im 17/32  brain]
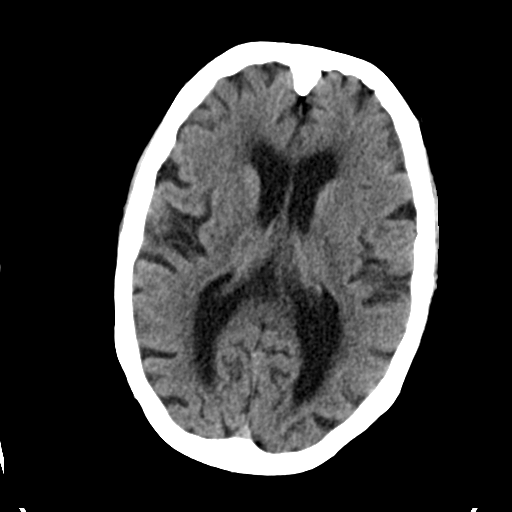
[im 17/32  bone]
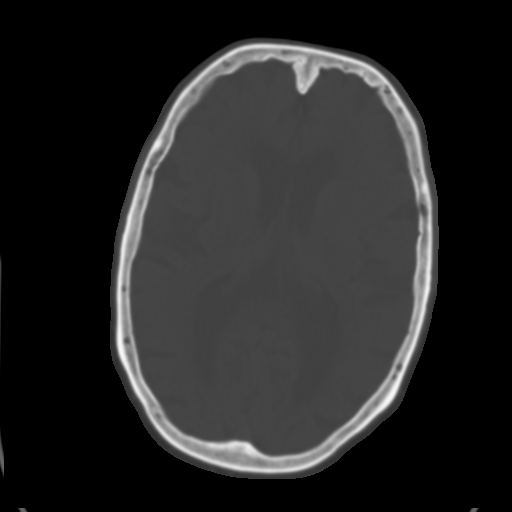
[im 20/32  brain]
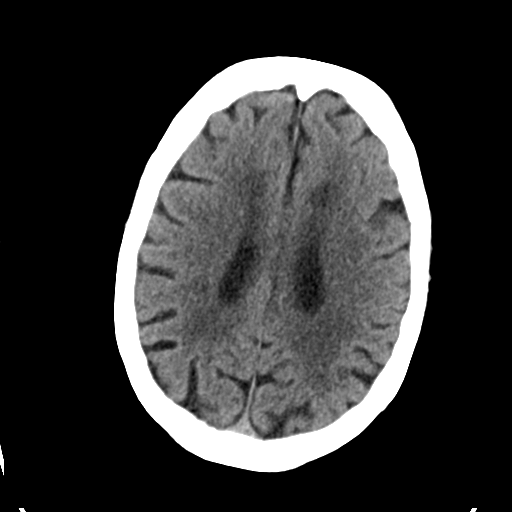
[im 23/32  brain]
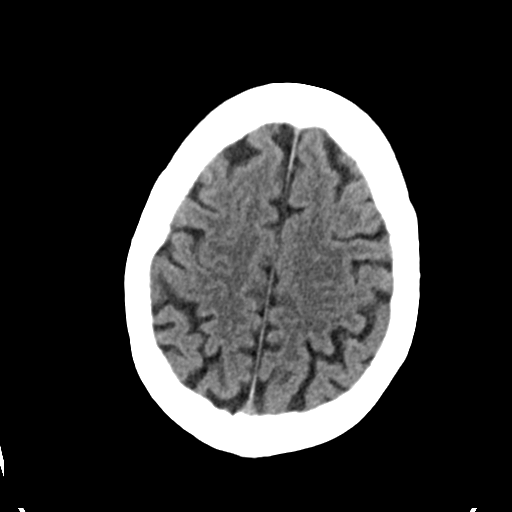
[im 26/32  brain]
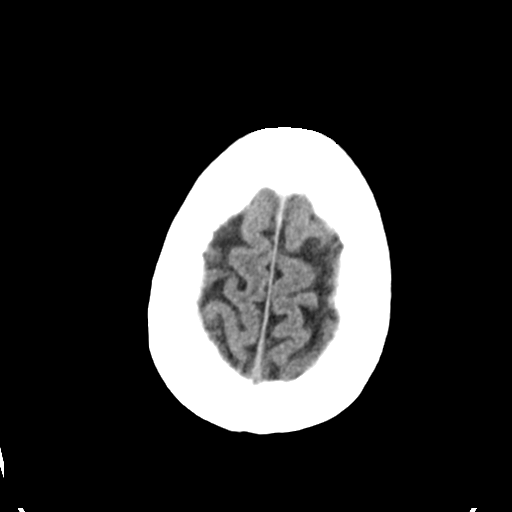
[im 29/32  brain]
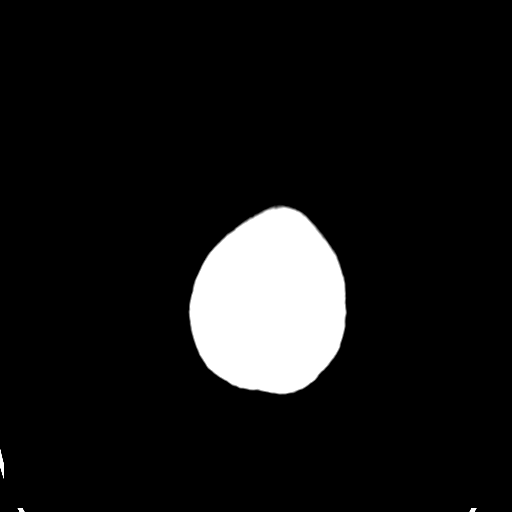
[im 29/32  bone]
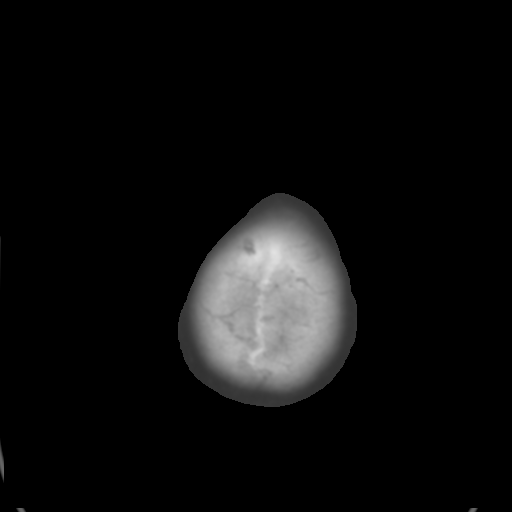

[Series 5: head 3.0 mpr cor · coronal · 0.30mm/px · 3 of 73 slices shown]
[im 25/73  brain]
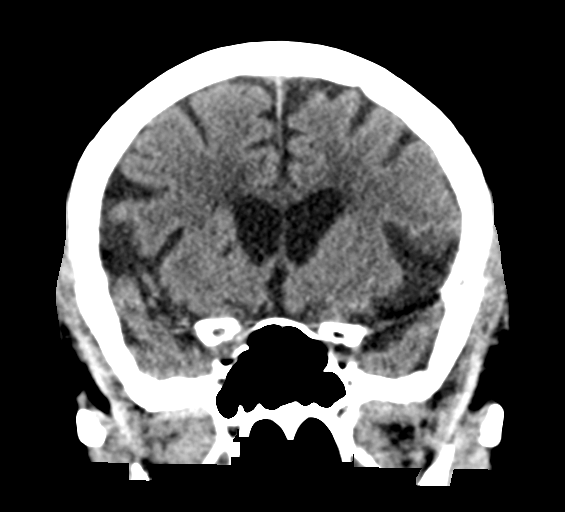
[im 33/73  brain]
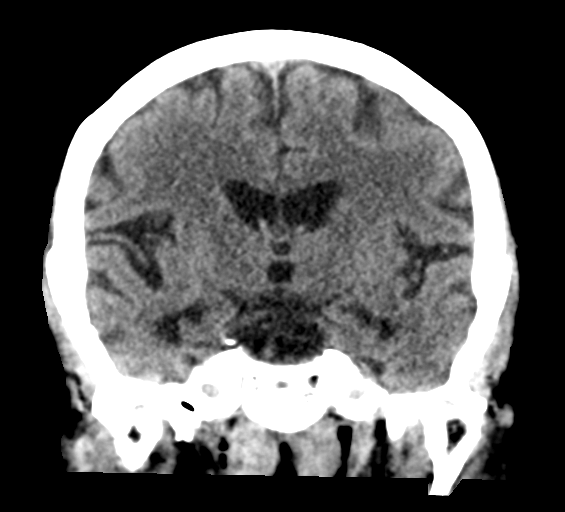
[im 41/73  brain]
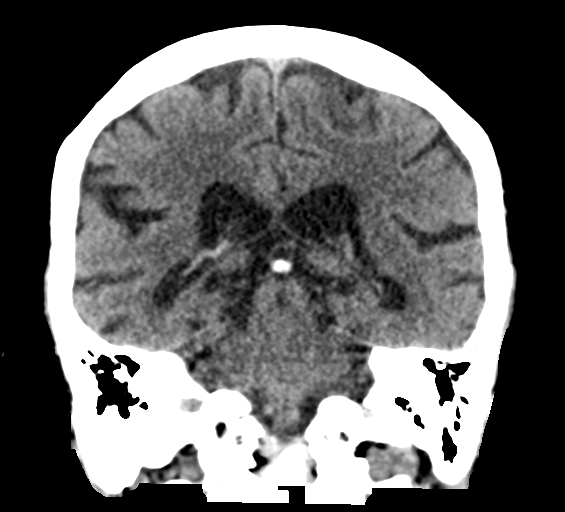

[Series 6: head 3.0 mpr sag · sagittal · 0.30mm/px · 3 of 56 slices shown]
[im 19/56  brain]
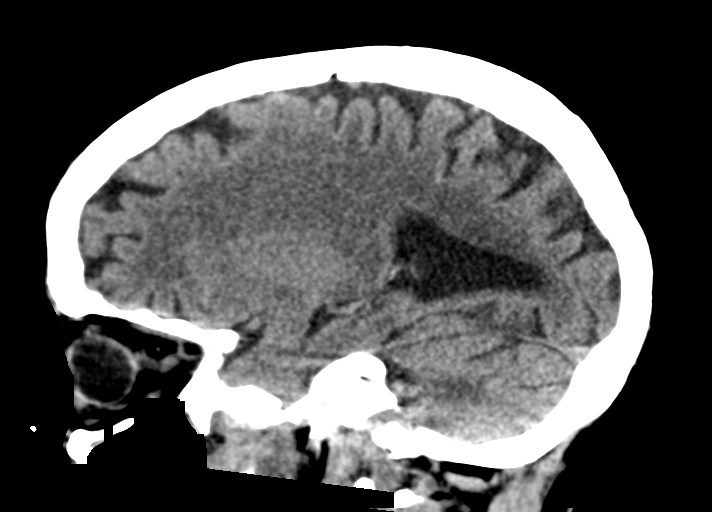
[im 28/56  brain]
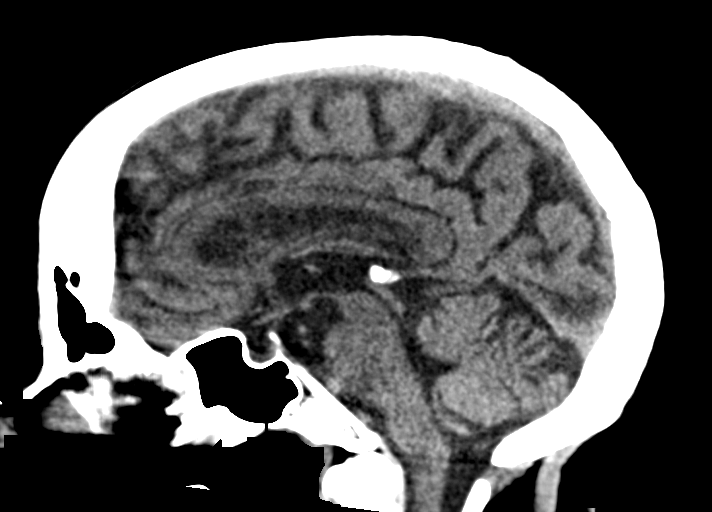
[im 37/56  brain]
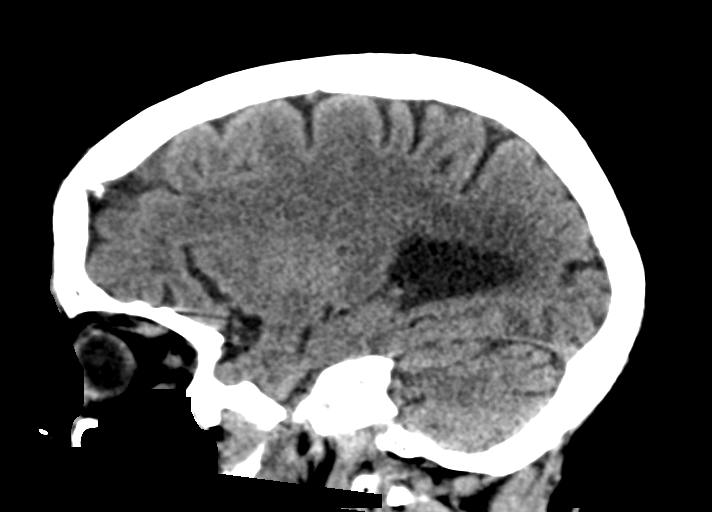

[15 of 47 positions shown; findings below may reference images not displayed]

FINDINGS: Brain: Mild chronic ischemic white matter disease is noted. No mass
effect or midline shift is noted. Ventricular size is within normal
limits. There is no evidence of mass lesion, hemorrhage or acute
infarction.

Vascular: No hyperdense vessel or unexpected calcification.

Skull: Normal. Negative for fracture or focal lesion.

Sinuses/Orbits: No acute finding.

Other: None.
IMPRESSION: Mild chronic ischemic white matter disease. No acute intracranial
abnormality seen.

## 2020-10-20 IMAGING — DX DG CHEST 1V PORT
1 series · 1 of 1 positions shown · non-contrast
Comparison: [DATE]

CLINICAL DATA: Lethargy, confusion, suspected COVID 19

EXAM:
PORTABLE CHEST 1 VIEW

[chest ap]
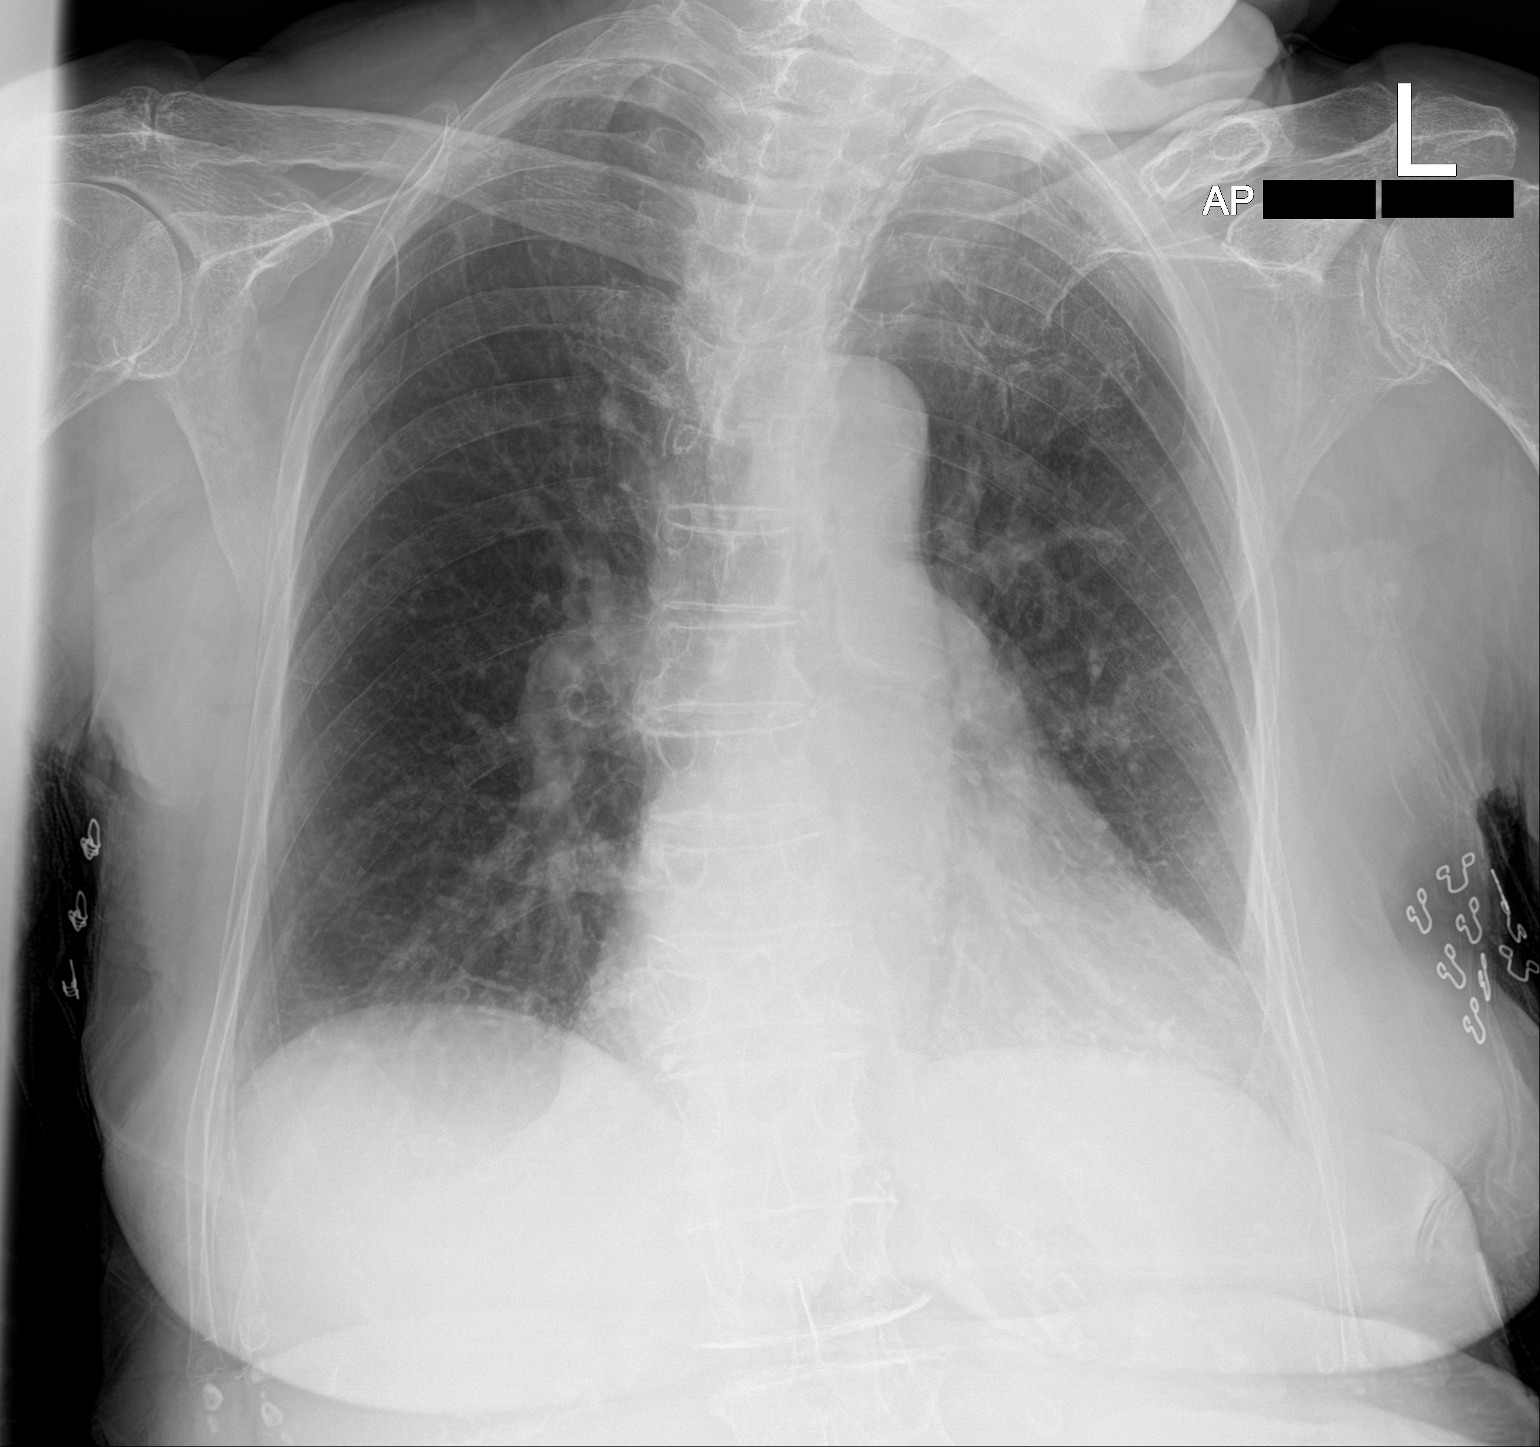

[1 of 1 positions shown; findings below may reference images not displayed]

FINDINGS: Single frontal view of the chest demonstrates an unremarkable
cardiac silhouette. No airspace disease, effusion, or pneumothorax.
No acute bony abnormalities.
IMPRESSION: 1. No acute intrathoracic process.

## 2020-10-20 MED ORDER — POTASSIUM CHLORIDE CRYS ER 20 MEQ PO TBCR
40.0000 meq | EXTENDED_RELEASE_TABLET | Freq: Once | ORAL | Status: AC
  Administered 2020-10-20: 40 meq via ORAL
  Filled 2020-10-20: qty 2

## 2020-10-20 MED ORDER — ACETAMINOPHEN 325 MG PO TABS
650.0000 mg | ORAL_TABLET | Freq: Once | ORAL | Status: AC
  Administered 2020-10-20: 650 mg via ORAL
  Filled 2020-10-20: qty 2

## 2020-10-20 MED ORDER — SODIUM CHLORIDE 0.9 % IV BOLUS
500.0000 mL | Freq: Once | INTRAVENOUS | Status: AC
  Administered 2020-10-20: 500 mL via INTRAVENOUS

## 2020-10-20 NOTE — ED Provider Notes (Signed)
[facility] Provider Note   CSN: [id number] Arrival date & time: 10/20/20  1527     History Chief Complaint  Patient presents with  . Altered Mental Status    Alicia Vang is a 86 y.o. female.  The history is provided by the patient, medical records and a relative. The history is limited by the condition of the patient.  Altered Mental Status Presenting symptoms: behavior changes, confusion and disorientation   Severity:  Severe Most recent episode:  Yesterday Episode history:  Continuous Timing:  Constant Progression:  Worsening Chronicity:  New Context: not dementia and not head injury   Associated symptoms: fever and weakness (generalized)   Associated symptoms: no abdominal pain, no agitation, no headaches, no light-headedness, no nausea, no palpitations, no seizures, no slurred speech (aphasia present) and no vomiting        Past Medical History:  Diagnosis Date  . Arthritis   . Diabetes mellitus without complication (HCC)    Type II  . Hypertension   . Hypokalemia     Patient Active Problem List   Diagnosis Date Noted  . Primary osteoarthritis of left hip 10/26/2017  . Osteoarthritis of left hip 08/17/2017    Past Surgical History:  Procedure Laterality Date  . ABDOMINAL HYSTERECTOMY    . CHALAZION EXCISION  11/06/2011   Procedure: MINOR EXCISION OF CHALAZION;  Surgeon: Vita Erm.;  Location: [facility];  Service: Ophthalmology;  Laterality: Left;  . CHALAZION EXCISION  02/02/2012   Procedure: MINOR EXCISION OF CHALAZION;  Surgeon: Vita Erm., MD;  Location: [facility];  Service: Ophthalmology;  Laterality: Left;  left eye upper lid  . CHALAZION EXCISION Right 03/28/2013   Procedure: MINOR EXCISION OF CHALAZION upper and lower right eye ;  Surgeon: Vita Erm., MD;  Location: [facility];  Service: Ophthalmology;  Laterality: Right;   . CHALAZION EXCISION Right 03/28/2013   Procedure: MINOR EXCISION OF CHALAZION UPPER AND LOWER RIGHT EYE  ;  Surgeon: Vita Erm., MD;  Location: [facility] OR;  Service: Ophthalmology;  Laterality: Right;  . COLONOSCOPY    . TOTAL HIP ARTHROPLASTY Left 10/26/2017   Procedure: TOTAL HIP ARTHROPLASTY ANTERIOR APPROACH;  Surgeon: Gean Birchwood, MD;  Location: [facility] OR;  Service: Orthopedics;  Laterality: Left;     OB History   No obstetric history on file.     No family history on file.  Social History   Tobacco Use  . Smoking status: Never Smoker  . Smokeless tobacco: Never Used  Vaping Use  . Vaping Use: Never used  Substance Use Topics  . Alcohol use: No  . Drug use: No    Home Medications Prior to Admission medications   Medication Sig Start Date End Date Taking? Authorizing Provider  aspirin EC 325 MG tablet Take 1 tablet (325 mg total) by mouth 2 (two) times daily. 10/26/17   Allena Katz, PA-C  chlorthalidone (HYGROTON) 25 MG tablet Take 25 mg by mouth daily.    [provider]  CINNAMON PO Take 1 tablet by mouth daily.    [provider]  COMBIGAN 0.2-0.5 % ophthalmic solution Place 1 drop into both eyes 2 (two) times daily. 08/06/17   [provider]  cycloSPORINE (RESTASIS) 0.05 % ophthalmic emulsion Place 1 drop into both eyes 2 (two) times daily.    [provider]  glimepiride (AMARYL) 2 MG tablet Take 4 mg  by mouth daily with breakfast.     [provider]  hydrALAZINE (APRESOLINE) 10 MG tablet Take 10 mg by mouth daily.    [provider]  hydrochlorothiazide (HYDRODIURIL) 25 MG tablet Take 1 tablet (25 mg total) by mouth daily. Patient not taking: Reported on 10/15/2017 08/20/17   Lamont Snowball, MD  latanoprost (XALATAN) 0.005 % ophthalmic solution Place 1 drop into both eyes at bedtime.    [provider]  losartan (COZAAR) 100 MG tablet Take 100 mg by mouth daily.    [provider]   metFORMIN (GLUCOPHAGE) 500 MG tablet Take by mouth once.    [provider]  Metoprolol Succinate 50 MG CS24 Take 50 mg by mouth daily.     [provider]  Multiple Vitamins-Minerals (PRESERVISION AREDS 2 PO) Take by mouth daily.    [provider]  oxyCODONE-acetaminophen (ROXICET) 5-325 MG tablet Take 1 tablet by mouth every 4 (four) hours as needed. 10/26/17   Allena Katz, PA-C  Potassium Chloride ER 20 MEQ TBCR Take 20 mEq by mouth daily.     [provider]  tiZANidine (ZANAFLEX) 2 MG tablet Take 1 tablet (2 mg total) by mouth every 6 (six) hours as needed for muscle spasms. 10/26/17   Allena Katz, PA-C  traMADol (ULTRAM) 50 MG tablet Take 50 mg by mouth every 6 (six) hours as needed for moderate pain or severe pain.    [provider]    Allergies    Amlodipine  Review of Systems   Review of Systems  Constitutional: Positive for chills, fatigue and fever. Negative for diaphoresis.  HENT: Positive for congestion.   Eyes: Negative for visual disturbance.  Respiratory: Positive for cough and shortness of breath. Negative for chest tightness.   Cardiovascular: Negative for chest pain and palpitations.  Gastrointestinal: Positive for diarrhea. Negative for abdominal pain, constipation, nausea and vomiting.  Genitourinary: Negative for flank pain.  Musculoskeletal: Negative for back pain.  Neurological: Positive for speech difficulty and weakness (generalized). Negative for seizures, light-headedness, numbness and headaches.  Psychiatric/Behavioral: Positive for confusion. Negative for agitation.  All other systems reviewed and are negative.   Physical Exam Updated Vital Signs BP (!) 113/55 (BP Location: Right Arm)   Pulse 69   Temp 100.3 F (37.9 C) (Oral)   Resp 19   Ht 5\' 5"  (1.651 m)   Wt 44.5 kg   SpO2 96%   BMI 16.33 kg/m   Physical Exam Vitals and nursing note reviewed.  Constitutional:      General: She is not in  acute distress.    Appearance: She is well-developed and well-nourished. She is not ill-appearing, toxic-appearing or diaphoretic.  HENT:     Head: Normocephalic and atraumatic.     Right Ear: External ear normal.     Left Ear: External ear normal.     Nose: Nose normal. No congestion or rhinorrhea.     Mouth/Throat:     Mouth: Oropharynx is clear and moist.     Pharynx: No oropharyngeal exudate.  Eyes:     Extraocular Movements: EOM normal.     Conjunctiva/sclera: Conjunctivae normal.     Pupils: Pupils are equal, round, and reactive to light.  Cardiovascular:     Rate and Rhythm: Regular rhythm. Bradycardia present.     Pulses: Normal pulses.     Heart sounds: No murmur heard.   Pulmonary:     Effort: Respiratory distress present.     Breath sounds:  No stridor. Rhonchi present. No wheezing or rales.  Chest:     Chest wall: No tenderness.  Abdominal:     General: Abdomen is flat. There is no distension.     Tenderness: There is no abdominal tenderness. There is no right CVA tenderness, left CVA tenderness, guarding or rebound.  Musculoskeletal:        General: No tenderness.     Cervical back: Normal range of motion and neck supple. No tenderness.  Skin:    General: Skin is warm.     Capillary Refill: Capillary refill takes less than 2 seconds.     Coloration: Skin is not pale.     Findings: No erythema or rash.  Neurological:     Mental Status: She is alert. She is disoriented.     GCS: GCS eye subscore is 3. GCS verbal subscore is 4. GCS motor subscore is 6.     Cranial Nerves: No dysarthria or facial asymmetry.     Sensory: No sensory deficit.     Motor: No weakness or abnormal muscle tone.     Deep Tendon Reflexes: Reflexes are normal and symmetric.     Comments: Patient has some aphasia and difficulty speaking.  No dysarthria.  She is somnolent.      ED Results / Procedures / Treatments   Labs (all labs ordered are listed, but only abnormal results are  displayed) Labs Reviewed  COMPREHENSIVE METABOLIC PANEL - Abnormal; Notable for the following components:      Result Value   Potassium 3.0 (*)    Glucose, Bld 174 (*)    Creatinine, Ser 1.36 (*)    GFR, Estimated 37 (*)    All other components within normal limits  CBC WITH DIFFERENTIAL/PLATELET - Abnormal; Notable for the following components:   WBC 3.6 (*)    Hemoglobin 11.4 (*)    Lymphs Abs 0.3 (*)    All other components within normal limits  URINALYSIS, ROUTINE W REFLEX MICROSCOPIC - Abnormal; Notable for the following components:   Hgb urine dipstick SMALL (*)    Bacteria, UA RARE (*)    All other components within normal limits  POC SARS CORONAVIRUS 2 AG -  ED - Abnormal; Notable for the following components:   SARS Coronavirus 2 Ag POSITIVE (*)    All other components within normal limits  CULTURE, BLOOD (ROUTINE X 2)  CULTURE, BLOOD (ROUTINE X 2)  URINE CULTURE  LACTIC ACID, PLASMA  PROTIME-INR  AMMONIA  TSH  LACTIC ACID, PLASMA    EKG EKG Interpretation  Date/Time:  Wednesday October 20 2020 16:37:48 EST Ventricular Rate:  69 PR Interval:  172 QRS Duration: 98 QT Interval:  424 QTC Calculation: 454 R Axis:   -76 Text Interpretation: Sinus rhythm with marked sinus arrhythmia Incomplete right bundle branch block Left anterior fascicular block Nonspecific ST abnormality Abnormal ECG When compared to prior, similar appreance. No STEMI Confirmed by Theda Belfast ([id number]) on 10/20/2020 7:40:50 PM   Radiology CT Head Wo Contrast  Result Date: 10/20/2020 CLINICAL DATA:  Altered mental status. EXAM: CT HEAD WITHOUT CONTRAST TECHNIQUE: Contiguous axial images were obtained from the base of the skull through the vertex without intravenous contrast. COMPARISON:  None. FINDINGS: Brain: Mild chronic ischemic white matter disease is noted. No mass effect or midline shift is noted. Ventricular size is within normal limits. There is no evidence of mass lesion, hemorrhage  or acute infarction. Vascular: No hyperdense vessel or unexpected calcification. Skull: Normal. Negative for  fracture or focal lesion. Sinuses/Orbits: No acute finding. Other: None. IMPRESSION: Mild chronic ischemic white matter disease. No acute intracranial abnormality seen. Electronically Signed   By: Lupita RaiderJames  Green Jr M.D.   On: 10/20/2020 21:36   DG Chest Portable 1 View  Result Date: 10/20/2020 CLINICAL DATA:  Lethargy, confusion, suspected COVID 19 EXAM: PORTABLE CHEST 1 VIEW COMPARISON:  08/13/2017 FINDINGS: Single frontal view of the chest demonstrates an unremarkable cardiac silhouette. No airspace disease, effusion, or pneumothorax. No acute bony abnormalities. IMPRESSION: 1. No acute intrathoracic process. Electronically Signed   By: Sharlet SalinaMichael  Brown M.D.   On: 10/20/2020 17:35    Procedures Procedures (including critical care time)  Medications Ordered in ED Medications  acetaminophen (TYLENOL) tablet 650 mg (650 mg Oral Given 10/20/20 1649)  sodium chloride 0.9 % bolus 500 mL (0 mLs Intravenous Stopped 10/20/20 2255)  potassium chloride SA (KLOR-CON) CR tablet 40 mEq (40 mEq Oral Given 10/20/20 2254)    ED Course  I have reviewed the triage vital signs and the nursing notes.  Pertinent labs & imaging results that were available during my care of the patient were reviewed by me and considered in my medical decision making (see chart for details).    MDM Rules/Calculators/A&P                          Jannette FogoLossie S Moorhouse is a 86 y.o. female with a past medical history significant for hypertension, diabetes, and osteoarthritis who presents with fatigue for the last few days, cough, some shortness of breath, aphasia and abnormal behavior.  According to the daughter who accompanied patient, patient has been vaccinated against COVID-19.  She has had some shortness of breath and some cough for the last few days but it started acting more more fatigue.  Normally she is up, ambulatory,  very independent.  She lives with the family.  They report that today, patient has been very "lethargic" and not acting like herself.  They went to get tested for Covid yesterday and that test has not yet returned.  They say that when the patient got home, she was acting very bizarrely and tried to take her clothes off inappropriately, was calling people by the wrong names, and then was having difficulty with speech.  Patient does not have a history of TIA or stroke in the chart and the family does not know of any prior stroke.  Patient today has had even more difficulty speaking with difficult time getting the words out.  Patient has been somnolent and difficult to arouse at times.  Patient has not been able to pull herself up out of the bed and has not been able to ambulate today due to the severe fatigue.  Daughter did not feel that she had any focal numbness or weakness but patient is difficult historian.  Patient was able to drink some water today and have some food and did not have vomiting.  Daughter did report the patient had a loose bowel movement.  On exam, patient is disoriented.  She was warm to the touch and had a temperature of 101.2 on arrival.  She is having mild bradycardia but is not hypotensive.  Oxygen saturations have been in the 90s and she is resting comfortably.  She is difficult to arouse but is able to do so.  Breath sounds have some coarseness bilaterally.  Chest and abdomen are nontender.  She is able to move all extremities very weakly and  denies numbness.  She had clear speech and I was speaking to her but she could not get everything she wanted to say out.  Work-up here shows that she does have COVID-19 despite vaccination.  Her lactic acid is not elevated and she does not have a leukocytosis.  Potassium is low, oral potassium ordered.  Creatinine is slightly elevated compared to prior.  Still waiting on urinalysis which she has not urinated here despite being here for 7-1/2 hours.   CT head was performed given the somnolence to make sure there is no intracranial hemorrhage and it showed chronic changes but no acute bleed or acute stroke.  Given her altered mental status and the aphasia, MRI was ordered.  Due to the patient's fatigue with inability to ambulate, somnolence with altered mental status changes, aphasia, COVID-19 diagnosis, hypokalemia without wanting to eat or drink as much today, and overall ill appearance in this 86 year old, I do not feel she is going to be safe to go home.  Will call for admission while awaiting MRI to rule out stroke but I do feel this lady is having some deconditioning and overall illness in the setting of her new COVID-19 diagnosis.  Hospitalist agrees with admission and she will be admitted.  MRI ordered to rule out stroke as well.   Final Clinical Impression(s) / ED Diagnoses Final diagnoses:  Somnolence  Altered mental status, unspecified altered mental status type  COVID-19  Bizarre behavior  Cough  Aphasia   Clinical Impression: 1. Somnolence   2. Altered mental status, unspecified altered mental status type   3. COVID-19   4. Bizarre behavior   5. Cough   6. Aphasia     Disposition: Admit  This note was prepared with assistance of Dragon voice recognition software. Occasional wrong-word or sound-a-like substitutions may have occurred due to the inherent limitations of voice recognition software.     Kelis Plasse, Canary Brim, MD 10/20/20 [phone]

## 2020-10-20 NOTE — ED Notes (Signed)
Pt transported to MRI at this time 

## 2020-10-20 NOTE — ED Notes (Signed)
Pt returned from CT °

## 2020-10-20 NOTE — ED Triage Notes (Signed)
The pt was brought in by her daughter  Whom she lives with  Since 0100am the pt has been more lethargic than usual  Sl confusion  Unable to dress herself this am  Dizziness earlier  She ate breakfast this am   Usually independent  She has a covid test yesterday no results for 24-48 hours.  She has had the vaccines

## 2020-10-20 NOTE — ED Triage Notes (Signed)
The pt has had the 2 covid vaccines  And she was tested yesterday

## 2020-10-21 ENCOUNTER — Inpatient Hospital Stay (HOSPITAL_COMMUNITY): Payer: Medicare PPO

## 2020-10-21 DIAGNOSIS — E876 Hypokalemia: Secondary | ICD-10-CM | POA: Diagnosis present

## 2020-10-21 DIAGNOSIS — U071 COVID-19: Secondary | ICD-10-CM | POA: Diagnosis not present

## 2020-10-21 DIAGNOSIS — E119 Type 2 diabetes mellitus without complications: Secondary | ICD-10-CM

## 2020-10-21 DIAGNOSIS — R001 Bradycardia, unspecified: Secondary | ICD-10-CM | POA: Diagnosis present

## 2020-10-21 LAB — I-STAT ARTERIAL BLOOD GAS, ED
Acid-Base Excess: 1 mmol/L (ref 0.0–2.0)
Bicarbonate: 24.7 mmol/L (ref 20.0–28.0)
Calcium, Ion: 1.22 mmol/L (ref 1.15–1.40)
HCT: 33 % — ABNORMAL LOW (ref 36.0–46.0)
Hemoglobin: 11.2 g/dL — ABNORMAL LOW (ref 12.0–15.0)
O2 Saturation: 95 %
Patient temperature: 100.6
Potassium: 2.9 mmol/L — ABNORMAL LOW (ref 3.5–5.1)
Sodium: 140 mmol/L (ref 135–145)
TCO2: 26 mmol/L (ref 22–32)
pCO2 arterial: 37.4 mmHg (ref 32.0–48.0)
pH, Arterial: 7.432 (ref 7.350–7.450)
pO2, Arterial: 75 mmHg — ABNORMAL LOW (ref 83.0–108.0)

## 2020-10-21 LAB — CBG MONITORING, ED
Glucose-Capillary: 149 mg/dL — ABNORMAL HIGH (ref 70–99)
Glucose-Capillary: 197 mg/dL — ABNORMAL HIGH (ref 70–99)
Glucose-Capillary: 162 mg/dL — ABNORMAL HIGH (ref 70–99)

## 2020-10-21 LAB — GLUCOSE, CAPILLARY: Glucose-Capillary: 191 mg/dL — ABNORMAL HIGH (ref 70–99)

## 2020-10-21 IMAGING — MR MR HEAD W/O CM
12 of 13 series · 44 of 48 positions shown · non-contrast
Comparison: None.

CLINICAL DATA: Aphasia.  [92].  Encephalopathy.

EXAM:
MRI HEAD WITHOUT CONTRAST
TECHNIQUE: Multiplanar, multiecho pulse sequences of the brain and surrounding
structures were obtained without intravenous contrast.

[Series 5: DWI · axial · 3.0mm · 0.88mm/px · z∈[-81,+57]mm · 8 of 96 slices shown (1 of 4)]
[im 1/96]
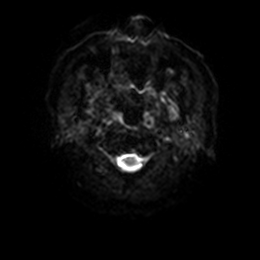
[im 14/96]
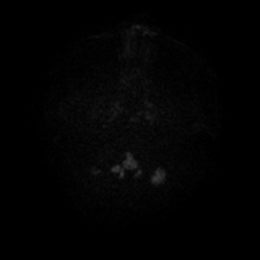
[im 28/96]
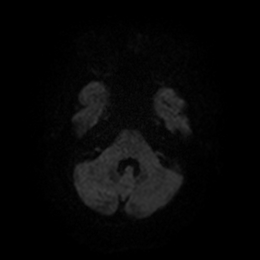
[im 41/96]
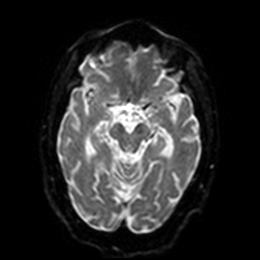
[im 55/96]
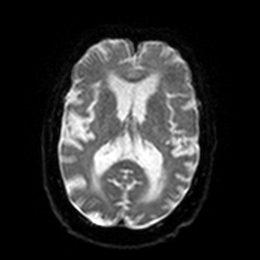
[im 68/96]
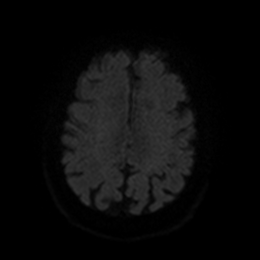
[im 82/96]
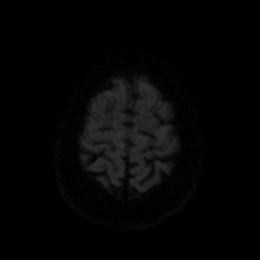
[im 96/96]
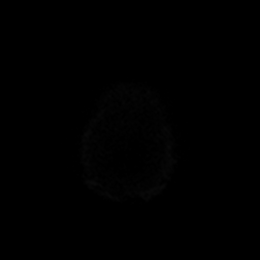

[Series 6: DWI · axial · 3.0mm · 0.88mm/px · z∈[-81,+57]mm · 4 of 48 slices shown (2 of 4)]
[im 1/48]
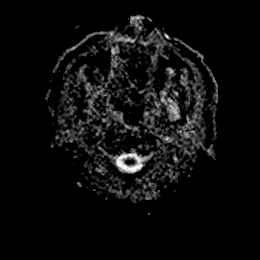
[im 16/48]
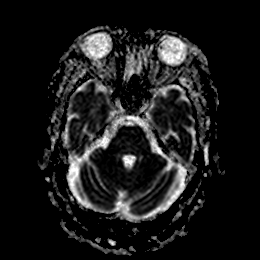
[im 32/48]
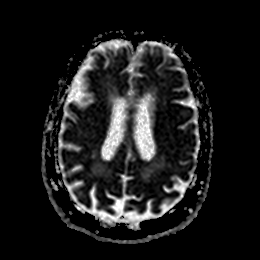
[im 48/48]
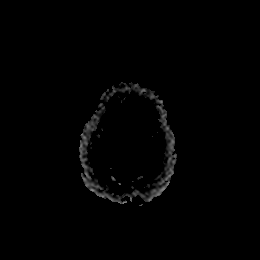

[Series 7: DWI · coronal · 4.0mm · 0.88mm/px · 6 of 68 slices shown (3 of 4)]
[im 1/68]
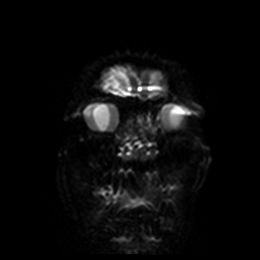
[im 14/68]
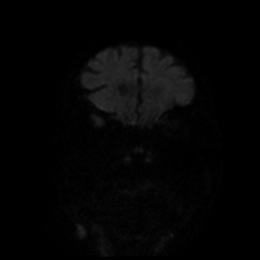
[im 27/68]
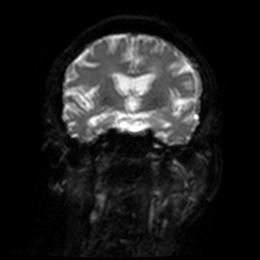
[im 41/68]
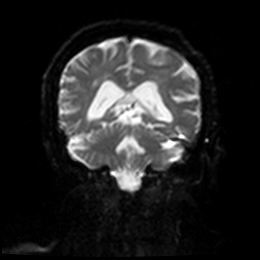
[im 54/68]
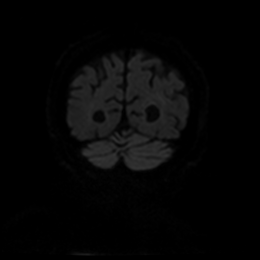
[im 68/68]
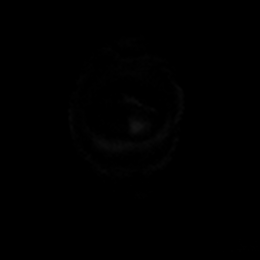

[Series 8: DWI · coronal · 4.0mm · 0.88mm/px · 3 of 34 slices shown (4 of 4)]
[im 1/34]
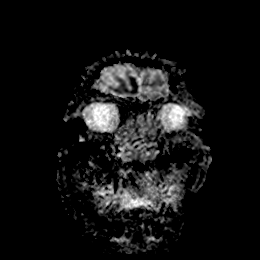
[im 17/34]
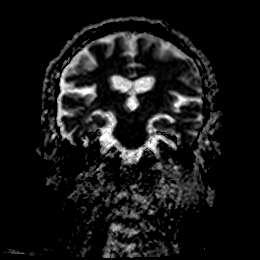
[im 34/34]
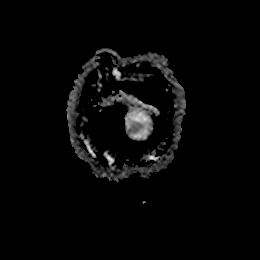

[Series 9: T1 · sagittal · 5.0mm · 0.75mm/px · 2 of 23 slices shown]
[im 1/23]
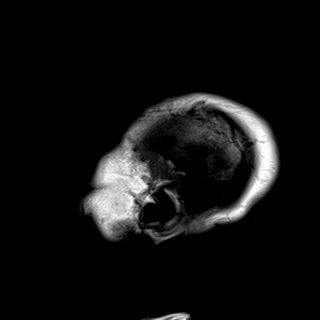
[im 23/23]
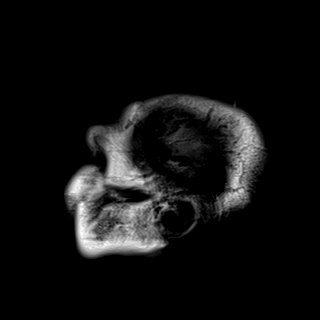

[Series 10: FLAIR · axial · 5.0mm · 0.90mm/px · z∈[-83,+59]mm · 2 of 25 slices shown]
[im 1/25]
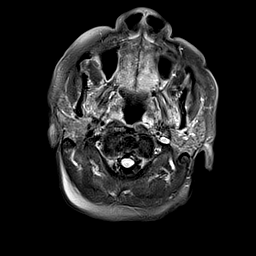
[im 25/25]
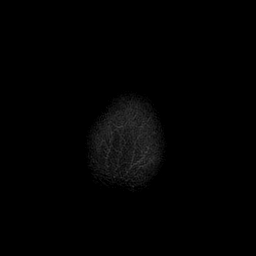

[Series 11: mag_images · axial · 3.0mm · 0.90mm/px · z∈[-79,+60]mm · 4 of 48 slices shown]
[im 1/48]
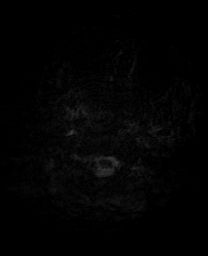
[im 16/48]
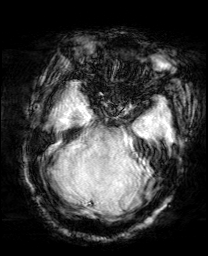
[im 32/48]
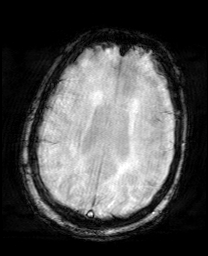
[im 48/48]
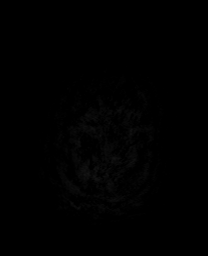

[Series 12: pha_images · axial · 3.0mm · 0.90mm/px · z∈[-76,+60]mm · 4 of 47 slices shown]
[im 1/47]
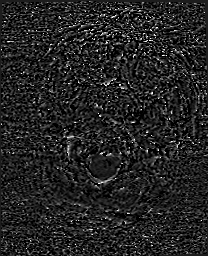
[im 16/47]
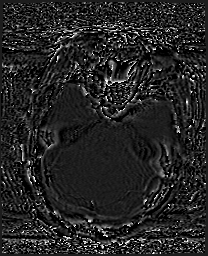
[im 31/47]
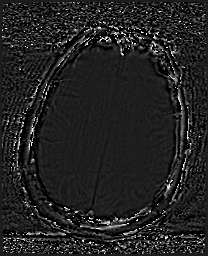
[im 47/47]
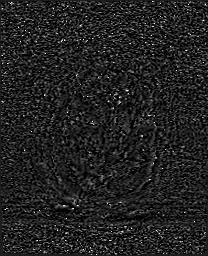

[Series 13: swi_images · axial · 3.0mm · 0.90mm/px · z∈[-79,+60]mm · 4 of 48 slices shown]
[im 1/48]
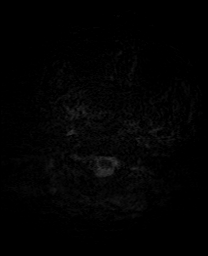
[im 16/48]
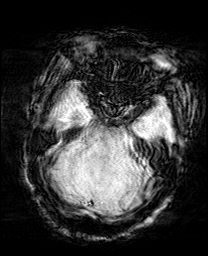
[im 32/48]
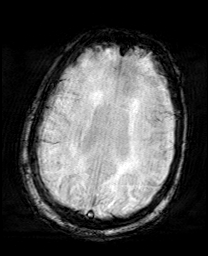
[im 48/48]
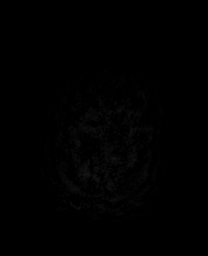

[Series 14: mip_images(sw) · axial · 24.0mm · 0.90mm/px · z∈[-69,+49]mm · 3 of 41 slices shown]
[im 1/41]
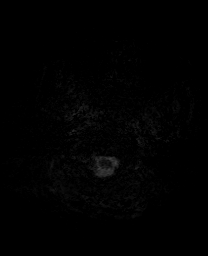
[im 21/41]
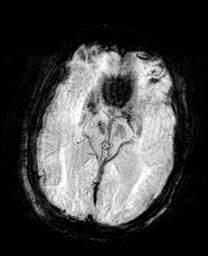
[im 41/41]
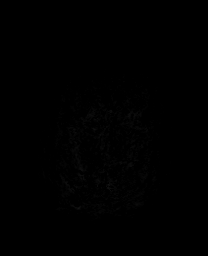

[Series 15: T2 · axial · 5.0mm · 0.72mm/px · z∈[-77,+53]mm · 2 of 23 slices shown (1 of 2)]
[im 1/23]
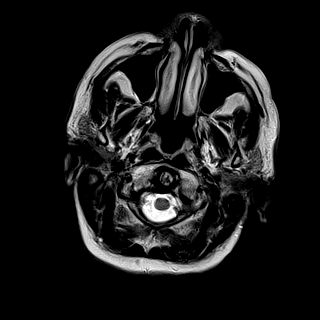
[im 23/23]
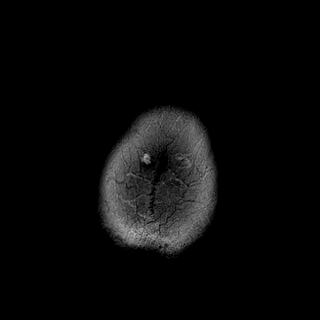

[Series 17: T2 · coronal · 5.0mm · 0.34mm/px · 2 of 29 slices shown (2 of 2)]
[im 1/29]
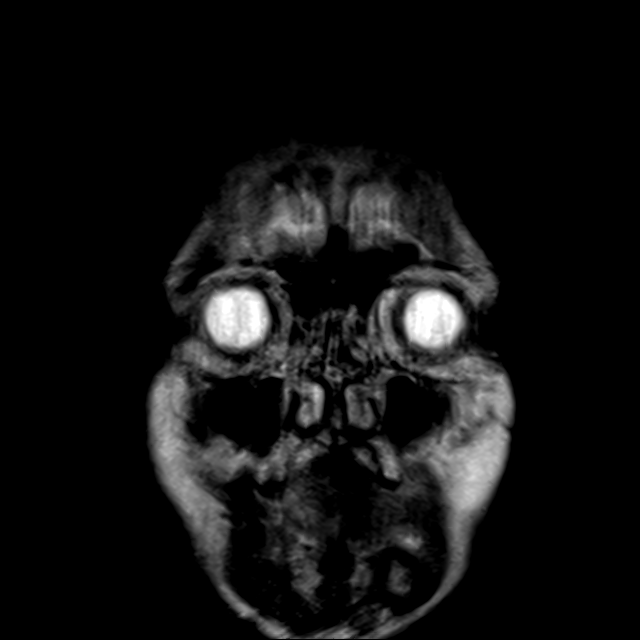
[im 29/29]
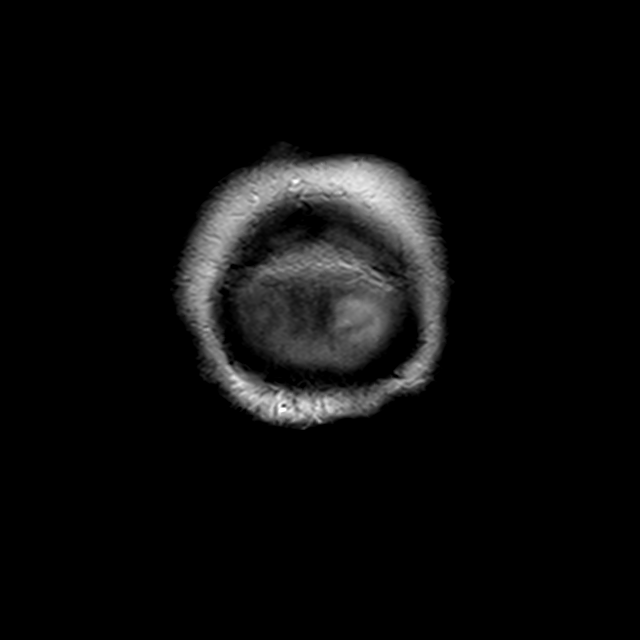

[44 of 48 positions shown; findings below may reference images not displayed]

FINDINGS: Brain: No acute infarct, mass effect or extra-axial collection. No
acute or chronic hemorrhage. There is multifocal hyperintense
T2-weighted signal within the white matter. Generalized volume loss
without a clear lobar predilection. The midline structures are
normal.

Vascular: Major flow voids are preserved.

Skull and upper cervical spine: Normal calvarium and skull base.
Visualized upper cervical spine and soft tissues are normal.

Sinuses/Orbits:No paranasal sinus fluid levels or advanced mucosal
thickening. No mastoid or middle ear effusion. Normal orbits.
IMPRESSION: 1. No acute intracranial abnormality.
2. Findings of chronic small vessel ischemia and generalized volume
loss.

## 2020-10-21 MED ORDER — POTASSIUM CHLORIDE CRYS ER 20 MEQ PO TBCR
40.0000 meq | EXTENDED_RELEASE_TABLET | Freq: Once | ORAL | Status: AC
  Administered 2020-10-21: 40 meq via ORAL
  Filled 2020-10-21: qty 2

## 2020-10-21 MED ORDER — SODIUM CHLORIDE 0.9 % IV SOLN
100.0000 mg | Freq: Every day | INTRAVENOUS | Status: DC
  Administered 2020-10-22 – 2020-10-24 (×3): 100 mg via INTRAVENOUS
  Filled 2020-10-21: qty 100
  Filled 2020-10-21 (×2): qty 20

## 2020-10-21 MED ORDER — ACETAMINOPHEN 325 MG PO TABS
650.0000 mg | ORAL_TABLET | Freq: Four times a day (QID) | ORAL | Status: DC | PRN
  Administered 2020-10-21 (×2): 650 mg via ORAL
  Filled 2020-10-21 (×2): qty 2

## 2020-10-21 MED ORDER — CYCLOSPORINE 0.05 % OP EMUL
1.0000 [drp] | Freq: Two times a day (BID) | OPHTHALMIC | Status: DC
  Administered 2020-10-22 – 2020-10-24 (×4): 1 [drp] via OPHTHALMIC
  Filled 2020-10-21 (×7): qty 1

## 2020-10-21 MED ORDER — LINAGLIPTIN 5 MG PO TABS
5.0000 mg | ORAL_TABLET | Freq: Every day | ORAL | Status: DC
  Administered 2020-10-21 – 2020-10-24 (×4): 5 mg via ORAL
  Filled 2020-10-21 (×4): qty 1

## 2020-10-21 MED ORDER — ASCORBIC ACID 500 MG PO TABS
500.0000 mg | ORAL_TABLET | Freq: Every day | ORAL | Status: DC
  Administered 2020-10-21 – 2020-10-24 (×4): 500 mg via ORAL
  Filled 2020-10-21 (×4): qty 1

## 2020-10-21 MED ORDER — GUAIFENESIN-DM 100-10 MG/5ML PO SYRP: 10.0000 mL | ORAL_SOLUTION | ORAL | Status: DC | PRN

## 2020-10-21 MED ORDER — ENOXAPARIN SODIUM 30 MG/0.3ML ~~LOC~~ SOLN
30.0000 mg | SUBCUTANEOUS | Status: DC
  Administered 2020-10-21 – 2020-10-24 (×4): 30 mg via SUBCUTANEOUS
  Filled 2020-10-21 (×4): qty 0.3

## 2020-10-21 MED ORDER — INSULIN ASPART 100 UNIT/ML ~~LOC~~ SOLN
0.0000 [IU] | Freq: Every day | SUBCUTANEOUS | Status: DC
  Administered 2020-10-22: 3 [IU] via SUBCUTANEOUS

## 2020-10-21 MED ORDER — ASPIRIN EC 325 MG PO TBEC
325.0000 mg | DELAYED_RELEASE_TABLET | Freq: Two times a day (BID) | ORAL | Status: DC
Start: 2020-10-21 — End: 2020-10-24
  Administered 2020-10-21 – 2020-10-24 (×7): 325 mg via ORAL
  Filled 2020-10-21 (×7): qty 1

## 2020-10-21 MED ORDER — BRIMONIDINE TARTRATE-TIMOLOL 0.2-0.5 % OP SOLN: 1.0000 [drp] | Freq: Two times a day (BID) | OPHTHALMIC | Status: DC

## 2020-10-21 MED ORDER — LATANOPROST 0.005 % OP SOLN
1.0000 [drp] | Freq: Every day | OPHTHALMIC | Status: DC
  Administered 2020-10-21 – 2020-10-23 (×3): 1 [drp] via OPHTHALMIC
  Filled 2020-10-21: qty 2.5

## 2020-10-21 MED ORDER — POTASSIUM CHLORIDE IN NACL 40-0.9 MEQ/L-% IV SOLN
INTRAVENOUS | Status: DC
  Filled 2020-10-21 (×7): qty 1000

## 2020-10-21 MED ORDER — HYDRALAZINE HCL 20 MG/ML IJ SOLN: 5.0000 mg | INTRAMUSCULAR | Status: DC | PRN

## 2020-10-21 MED ORDER — VITAMIN D 25 MCG (1000 UNIT) PO TABS
1000.0000 [IU] | ORAL_TABLET | Freq: Every day | ORAL | Status: DC
  Administered 2020-10-21 – 2020-10-24 (×4): 1000 [IU] via ORAL
  Filled 2020-10-21 (×4): qty 1

## 2020-10-21 MED ORDER — HYDRALAZINE HCL 10 MG PO TABS
10.0000 mg | ORAL_TABLET | Freq: Every day | ORAL | Status: DC
  Administered 2020-10-21 – 2020-10-24 (×4): 10 mg via ORAL
  Filled 2020-10-21 (×4): qty 1

## 2020-10-21 MED ORDER — CYCLOSPORINE 0.05 % OP EMUL: 1.0000 [drp] | Freq: Two times a day (BID) | OPHTHALMIC | Status: DC

## 2020-10-21 MED ORDER — ALBUTEROL SULFATE HFA 108 (90 BASE) MCG/ACT IN AERS: 1.0000 | INHALATION_SPRAY | RESPIRATORY_TRACT | Status: DC | PRN

## 2020-10-21 MED ORDER — SODIUM CHLORIDE 0.9 % IV SOLN: INTRAVENOUS | Status: DC

## 2020-10-21 MED ORDER — SODIUM CHLORIDE 0.9 % IV SOLN
200.0000 mg | Freq: Once | INTRAVENOUS | Status: AC
  Administered 2020-10-21: 200 mg via INTRAVENOUS
  Filled 2020-10-21: qty 40

## 2020-10-21 MED ORDER — ZINC SULFATE 220 (50 ZN) MG PO CAPS
220.0000 mg | ORAL_CAPSULE | Freq: Every day | ORAL | Status: DC
  Administered 2020-10-21 – 2020-10-24 (×4): 220 mg via ORAL
  Filled 2020-10-21 (×5): qty 1

## 2020-10-21 MED ORDER — INSULIN ASPART 100 UNIT/ML ~~LOC~~ SOLN
0.0000 [IU] | Freq: Three times a day (TID) | SUBCUTANEOUS | Status: DC
  Administered 2020-10-21 (×2): 2 [IU] via SUBCUTANEOUS
  Administered 2020-10-21: 1 [IU] via SUBCUTANEOUS
  Administered 2020-10-22 – 2020-10-23 (×3): 2 [IU] via SUBCUTANEOUS
  Administered 2020-10-23 (×2): 1 [IU] via SUBCUTANEOUS
  Administered 2020-10-24: 2 [IU] via SUBCUTANEOUS
  Administered 2020-10-24: 1 [IU] via SUBCUTANEOUS

## 2020-10-21 NOTE — ED Notes (Signed)
Patient febrile 102F. Medicated as ordered. Patient incontinent of urine. Brief and sheets changed. New blankets provided. Repositioned in bed. clear yellow urine emptied from suction canister. Monitoring in place. Call bell in reach. Family at bedside. Will continue to monitor.

## 2020-10-21 NOTE — ED Notes (Signed)
Admitting doctor a the bedsixde

## 2020-10-21 NOTE — H&P (Signed)
History and Physical    Alicia NESSEL UJW:[id number] DOB: 08/01/1927 DOA: 10/20/2020  PCP: System, Provider Not In Patient coming from: Home  Chief Complaint: Lethargy, confusion  HPI: [name] is a 86 y.o. female with medical history significant of hypertension, non-insulin-dependent type 2 diabetes presenting to the ED for evaluation of lethargy and confusion.  Patient is somnolent, confused, and disoriented.  No history could be obtained from her.  Daughter reports that the patient has been vaccinated against Covid.  She has had a cough for the past few days and has appeared more fatigued.  Normally she is ambulatory and very independent.  Patient lives with her family and daughter reported that since yesterday patient has been very lethargic and not acting like herself.  They went to get her tested for Covid yesterday but the results have not come back yet.  Family stated that when the patient returned home, she was acting very bizarrely and try to take her clothes off inappropriately, was calling people the wrong names, and then was having difficulty with speech/word finding difficulty.  Family reported that patient has been somnolent and difficult to arouse at times.  She has not been able to pull herself up out of the bed and has not been able to ambulate due to severe fatigue.  She is still eating some but not drinking enough water.  She had one episode of diarrhea early yesterday morning over 24 hours ago but has not had any further episodes since then.  No vomiting reported.  ED Course: Disoriented and very somnolent on arrival to the ED.  Febrile with temperature 101.2 F.  Bradycardic with heart rate in the 40s to 50s.  Not tachypneic or hypoxic.  Not hypotensive.  SARS-CoV-2 rapid antigen test positive.  WBC 3.6 (absolute lymphocyte count low), hemoglobin 11.4 (at baseline), platelet 161K.  Sodium 137, potassium 3.0, chloride 99, bicarb 25, BUN 23, creatinine 1.3, glucose  174.  LFTs normal.  INR 1.1.  Initial lactic acid normal, repeat pending.  Blood culture x2 pending.  TSH level pending.  Ammonia within normal range.  UA not suggestive of infection.  Urine culture pending.  Chest x-ray showing no acute intrathoracic process.  Head CT negative for acute intracranial abnormality.  Brain MRI negative for acute intracranial abnormality.  Patient was given Tylenol, oral potassium 40 mEq, and 500 cc normal saline bolus.  Review of Systems:  All systems reviewed and apart from history of presenting illness, are negative.  Past Medical History:  Diagnosis Date  . Arthritis   . Diabetes mellitus without complication (HCC)    Type II  . Hypertension   . Hypokalemia     Past Surgical History:  Procedure Laterality Date  . ABDOMINAL HYSTERECTOMY    . CHALAZION EXCISION  11/06/2011   Procedure: MINOR EXCISION OF CHALAZION;  Surgeon: Vita Erm.;  Location: [facility];  Service: Ophthalmology;  Laterality: Left;  . CHALAZION EXCISION  02/02/2012   Procedure: MINOR EXCISION OF CHALAZION;  Surgeon: Vita Erm., MD;  [facility];  Service: Ophthalmology;  Laterality: Left;  left eye upper lid  . CHALAZION EXCISION Right 03/28/2013   Procedure: MINOR EXCISION OF CHALAZION upper and lower right eye ;  Surgeon: Vita Erm., MD;  Location: [facility];  Service: Ophthalmology;  Laterality: Right;  . CHALAZION EXCISION Right 03/28/2013   Procedure: MINOR EXCISION OF CHALAZION UPPER AND LOWER RIGHT EYE  ;  Surgeon: Vita Ermhomas E Gusman Jr., MD;  [facility];  Service: Ophthalmology;  Laterality: Right;  . COLONOSCOPY    . TOTAL HIP ARTHROPLASTY Left 10/26/2017   Procedure: TOTAL HIP ARTHROPLASTY ANTERIOR APPROACH;  Surgeon: Gean Birchwoodowan, Frank, MD;  Location: [facility] OR;  Service: Orthopedics;  Laterality: Left;     reports that she has never smoked. She has never used smokeless tobacco. She  reports that she does not drink alcohol and does not use drugs.  Allergies  Allergen Reactions  . Amlodipine Swelling    Swelling in feet    History reviewed. No pertinent family history.  Prior to Admission medications   Medication Sig Start Date End Date Taking? Authorizing Provider  aspirin EC 325 MG tablet Take 1 tablet (325 mg total) by mouth 2 (two) times daily. 10/26/17   Allena KatzPhillips, Eric K, PA-C  chlorthalidone (HYGROTON) 25 MG tablet Take 25 mg by mouth daily.    [provider]  CINNAMON PO Take 1 tablet by mouth daily.    [provider]  COMBIGAN 0.2-0.5 % ophthalmic solution Place 1 drop into both eyes 2 (two) times daily. 08/06/17   [provider]  cycloSPORINE (RESTASIS) 0.05 % ophthalmic emulsion Place 1 drop into both eyes 2 (two) times daily.    [provider]  glimepiride (AMARYL) 2 MG tablet Take 4 mg by mouth daily with breakfast.     [provider]  hydrALAZINE (APRESOLINE) 10 MG tablet Take 10 mg by mouth daily.    [provider]  hydrochlorothiazide (HYDRODIURIL) 25 MG tablet Take 1 tablet (25 mg total) by mouth daily. Patient not taking: Reported on 10/15/2017 08/20/17   Lamont SnowballGarza, Anna, MD  latanoprost (XALATAN) 0.005 % ophthalmic solution Place 1 drop into both eyes at bedtime.    [provider]  losartan (COZAAR) 100 MG tablet Take 100 mg by mouth daily.    [provider]  metFORMIN (GLUCOPHAGE) 500 MG tablet Take by mouth once.    [provider]  Metoprolol Succinate 50 MG CS24 Take 50 mg by mouth daily.     [provider]  Multiple Vitamins-Minerals (PRESERVISION AREDS 2 PO) Take by mouth daily.    [provider]  oxyCODONE-acetaminophen (ROXICET) 5-325 MG tablet Take 1 tablet by mouth every 4 (four) hours as needed. 10/26/17   Allena KatzPhillips, Eric K, PA-C  Potassium Chloride ER 20 MEQ TBCR Take 20 mEq by mouth daily.     [provider]  tiZANidine (ZANAFLEX)  2 MG tablet Take 1 tablet (2 mg total) by mouth every 6 (six) hours as needed for muscle spasms. 10/26/17   Allena KatzPhillips, Eric K, PA-C  traMADol (ULTRAM) 50 MG tablet Take 50 mg by mouth every 6 (six) hours as needed for moderate pain or severe pain.    [provider]    Physical Exam: Vitals:   10/20/20 2345 10/21/20 0200 10/21/20 0215 10/21/20 0217  BP: (!) 142/61  (!) 170/66   Pulse: (!) 49  (!) 49   Resp: 16 17 20    Temp:    (!) 100.6 F (38.1 C)  TempSrc:    Oral  SpO2: 99%  98%   Weight:      Height:        Physical Exam Constitutional:      Appearance: She is not diaphoretic.  HENT:     Head: Normocephalic and atraumatic.     Mouth/Throat:     Mouth: Mucous membranes are dry.  Eyes:  Extraocular Movements: Extraocular movements intact.  Cardiovascular:     Rate and Rhythm: Regular rhythm. Bradycardia present.     Pulses: Normal pulses.  Pulmonary:     Effort: Pulmonary effort is normal. No respiratory distress.     Breath sounds: No wheezing or rales.  Abdominal:     General: Bowel sounds are normal. There is no distension.     Palpations: Abdomen is soft.     Tenderness: There is no abdominal tenderness.  Musculoskeletal:        General: No swelling or tenderness.     Cervical back: Normal range of motion and neck supple.  Skin:    General: Skin is warm and dry.     Comments: Decreased skin turgor  Neurological:     Comments: Somnolent but arousable Oriented to self only Following commands intermittently     Labs on Admission: I have personally reviewed following labs and imaging studies  CBC: Recent Labs  Lab 10/20/20 1638  WBC 3.6*  NEUTROABS 2.7  HGB 11.4*  HCT 36.4  MCV 91.5  PLT 161   Basic Metabolic Panel: Recent Labs  Lab 10/20/20 1638  NA 137  K 3.0*  CL 99  CO2 25  GLUCOSE 174*  BUN 23  CREATININE 1.36*  CALCIUM 9.6   GFR: Estimated Creatinine Clearance: 18.9 mL/min (A) (by C-G formula based on SCr of 1.36 mg/dL  (H)). Liver Function Tests: Recent Labs  Lab 10/20/20 1638  AST 24  ALT 16  ALKPHOS 42  BILITOT 0.4  PROT 7.2  ALBUMIN 3.9   No results for input(s): LIPASE, AMYLASE in the last 168 hours. Recent Labs  Lab 10/20/20 2127  AMMONIA 31   Coagulation Profile: Recent Labs  Lab 10/20/20 1638  INR 1.1   Cardiac Enzymes: No results for input(s): CKTOTAL, CKMB, CKMBINDEX, TROPONINI in the last 168 hours. BNP (last 3 results) No results for input(s): PROBNP in the last 8760 hours. HbA1C: No results for input(s): HGBA1C in the last 72 hours. CBG: No results for input(s): GLUCAP in the last 168 hours. Lipid Profile: No results for input(s): CHOL, HDL, LDLCALC, TRIG, CHOLHDL, LDLDIRECT in the last 72 hours. Thyroid Function Tests: No results for input(s): TSH, T4TOTAL, FREET4, T3FREE, THYROIDAB in the last 72 hours. Anemia Panel: No results for input(s): VITAMINB12, FOLATE, FERRITIN, TIBC, IRON, RETICCTPCT in the last 72 hours. Urine analysis:    Component Value Date/Time   COLORURINE YELLOW 10/20/2020 2305   APPEARANCEUR CLEAR 10/20/2020 2305   LABSPEC 1.009 10/20/2020 2305   PHURINE 5.0 10/20/2020 2305   GLUCOSEU NEGATIVE 10/20/2020 2305   HGBUR SMALL (A) 10/20/2020 2305   BILIRUBINUR NEGATIVE 10/20/2020 2305   KETONESUR NEGATIVE 10/20/2020 2305   PROTEINUR NEGATIVE 10/20/2020 2305   NITRITE NEGATIVE 10/20/2020 2305   LEUKOCYTESUR NEGATIVE 10/20/2020 2305    Radiological Exams on Admission: CT Head Wo Contrast  Result Date: 10/20/2020 CLINICAL DATA:  Altered mental status. EXAM: CT HEAD WITHOUT CONTRAST TECHNIQUE: Contiguous axial images were obtained from the base of the skull through the vertex without intravenous contrast. COMPARISON:  None. FINDINGS: Brain: Mild chronic ischemic white matter disease is noted. No mass effect or midline shift is noted. Ventricular size is within normal limits. There is no evidence of mass lesion, hemorrhage or acute infarction.  Vascular: No hyperdense vessel or unexpected calcification. Skull: Normal. Negative for fracture or focal lesion. Sinuses/Orbits: No acute finding. Other: None. IMPRESSION: Mild chronic ischemic white matter disease. No acute intracranial abnormality seen. Electronically  Signed   By: Lupita Raider M.D.   On: 10/20/2020 21:36   MR BRAIN WO CONTRAST  Result Date: 10/21/2020 CLINICAL DATA:  Aphasia.  COVID-19.  Encephalopathy. EXAM: MRI HEAD WITHOUT CONTRAST TECHNIQUE: Multiplanar, multiecho pulse sequences of the brain and surrounding structures were obtained without intravenous contrast. COMPARISON:  None. FINDINGS: Brain: No acute infarct, mass effect or extra-axial collection. No acute or chronic hemorrhage. There is multifocal hyperintense T2-weighted signal within the white matter. Generalized volume loss without a clear lobar predilection. The midline structures are normal. Vascular: Major flow voids are preserved. Skull and upper cervical spine: Normal calvarium and skull base. Visualized upper cervical spine and soft tissues are normal. Sinuses/Orbits:No paranasal sinus fluid levels or advanced mucosal thickening. No mastoid or middle ear effusion. Normal orbits. IMPRESSION: 1. No acute intracranial abnormality. 2. Findings of chronic small vessel ischemia and generalized volume loss. Electronically Signed   By: Deatra Robinson M.D.   On: 10/21/2020 00:55   DG Chest Portable 1 View  Result Date: 10/20/2020 CLINICAL DATA:  Lethargy, confusion, suspected COVID 19 EXAM: PORTABLE CHEST 1 VIEW COMPARISON:  08/13/2017 FINDINGS: Single frontal view of the chest demonstrates an unremarkable cardiac silhouette. No airspace disease, effusion, or pneumothorax. No acute bony abnormalities. IMPRESSION: 1. No acute intrathoracic process. Electronically Signed   By: Sharlet Salina M.D.   On: 10/20/2020 17:35    EKG: Independently reviewed.  Sinus arrhythmia, LAFB, incomplete RBBB.  No significant change since  prior tracing.  Assessment/Plan Principal Problem:   COVID-19 virus infection Active Problems:   Acute encephalopathy   Bradycardia   Hypokalemia   Type 2 diabetes mellitus (HCC)   COVID-19 viral infection: Family reported that patient has been vaccinated against Covid.  However, SARS-CoV-2 rapid antigen testing in the ED positive.  She is febrile but not tachycardic or hypotensive.  Lactic acid level normal.  Chest x-ray not suggestive of pneumonia.  Not tachypneic or hypoxic.  However, family did report patient having a cough for the past few days. -Remdesivir -Hold steroids at this time given no hypoxia.  Satting around 98% on room air. -Vitamin C, zinc, vitamin D -Robitussin-DM as needed for cough -Tylenol as needed for fevers -Bronchodilator as needed -Check inflammatory markers including ferritin, fibrinogen, D-dimer, CRP, LDH -Check procalcitonin level -Airborne and contact precautions -Incentive spirometry, flutter valve -Encourage prone positioning -Continuous pulse ox -Supplemental oxygen as needed to keep oxygen saturation above 90% -Blood culture x2 pending  Acute metabolic encephalopathy: Family reported patient having confusion, bizarre behavior, and aphasia since yesterday.  Patient is currently oriented to self only and somnolent.  Head CT and brain MRI both negative for acute intracranial abnormality.  Ammonia level within normal range.  Not hypoglycemic.  UA without signs of infection.  Chest x-ray not suggestive of pneumonia, however, did test positive for COVID-19 which could possibly be contributing.  In addition, dehydration could also be playing a role (dry mucous membranes and decreased skin turgor noted on exam).  Polypharmacy was a consideration as oxycodone, Zanaflex, and tramadol are listed in home medications.  However, after speaking to the patient's daughter it appears these were prescribed to her back in 2019 after a hip surgery; not current  medications. -Give IV fluid hydration.  TSH level pending.  Also check B12 level.  Order arterial blood gas. Avoid sedating medications.  Urine culture pending.  Sinus bradycardia: Bradycardic with heart rate in the 40s to 50s. Metoprolol listed in home medications. -Continue cardiac monitoring.  TSH  level pending.  Hold metoprolol and any other AV nodal blocking agents.  Mild hypokalemia: Likely due to poor oral intake in the setting of acute viral illness.  Potassium 3.0. -Patient was given oral potassium supplementation in the ED.  Repeat BMP and check magnesium level.  Hypertension: Systolic currently in the 160s. -Hold metoprolol in the setting of bradycardia.  Order IV hydralazine PRN SBP greater than 170.  Non-insulin-dependent type 2 diabetes: No recent A1c results in the chart. -Check A1c.  Sliding scale insulin sensitive ACHS and CBG checks.  Hold home oral hypoglycemic agents.  Pharmacy med rec pending.  DVT prophylaxis: Lovenox Code Status: Full code-discussed with the patient's daughter. Family Communication: Daughter updated. Disposition Plan: Status is: Inpatient  Remains inpatient appropriate because:Altered mental status, IV treatments appropriate due to intensity of illness or inability to take PO and Inpatient level of care appropriate due to severity of illness   Dispo: The patient is from: Home              Anticipated d/c is to: SNF              Anticipated d/c date is: 3 days              Patient currently is not medically stable to d/c.  The medical decision making on this patient was of high complexity and the patient is at high risk for clinical deterioration, therefore this is a level 3 visit.  John Giovanni MD [facility]  If 7PM-7AM, please contact night-coverage [email or web address]  10/21/2020, 4:42 AM

## 2020-10-21 NOTE — ED Notes (Signed)
Assumed care of patient. Patient resting on stretched in position of comfort. Daughter at bedside. Cardiac, bp, and pulse ox monitoring place. Medications infusing. Patient responsive and talkative at this time. Denies needs. Updated on plan of care. Call bell in reach. Will continue to monitor.

## 2020-10-21 NOTE — Progress Notes (Signed)
Alicia Vang  GDJ:[id number] DOB: 02/24/1927 DOA: 10/20/2020 PCP: System, Provider Not In    Brief Narrative:  86 year old with a history of HTN and DM2 who presented to the ED with lethargy and confusion.  Patient's daughter had noticed a cough for a few days and worsening fatigue.  At baseline she is ambulatory and fully independent.  On the date of her admission she began to behave oddly and had clearly become significantly confused.  In the ED she is found to have a temperature of 101.2.  Covid testing was positive but she was not hypoxic.  There was no acute infiltrate on CXR.  Head CT was without acute findings and brain MRI was also nonacute.  Significant Events:  12/29 presented to [facility] ED 12/30 admit to [facility]  Date of Positive COVID Test:  12/29  Vaccination Status: Vaccine x2 -unclear booster status  COVID-19 specific Treatment: Remdesivir 12/29 >  Antimicrobials:  None  DVT prophylaxis: Lovenox  Subjective: The patient was examined by my partner earlier today.  Assessment & Plan:  Acute Covid infection   Recent Labs  Lab 10/20/20 1638  ALT 16     Acute metabolic encephalopathy -acute delirium No acute findings on CT or MRI head -ammonia normal -no hypoglycemia -no evidence of UTI  Sinus bradycardia Heart rate 40-50 during ED visit -metoprolol on hold  Acute kidney injury  Recent Labs  Lab 10/20/20 1638  CREATININE 1.36*    Normocytic anemia  Dehydration  Hypokalemia  HTN  DM 2   Code Status: FULL CODE Family Communication: Spoke with daughter at bedside Status is: Inpatient  Remains inpatient appropriate because:IV treatments appropriate due to intensity of illness or inability to take PO   Dispo: The patient is from: Home              Anticipated d/c is to: unclear              Anticipated d/c date is: 2 days              Patient currently is not medically stable to d/c.  Consultants:  none  Objective: Blood pressure  (!) 155/90, pulse (!) 44, temperature (!) 101.1 F (38.4 C), resp. rate (!) 22, height 5\' 5"  (1.651 m), weight 44.5 kg, SpO2 91 %.  Intake/Output Summary (Last 24 hours) at 10/21/2020 0814 Last data filed at 10/21/2020 0658 Gross per 24 hour  Intake 1725 ml  Output --  Net 1725 ml   Filed Weights   10/20/20 1635  Weight: 44.5 kg    CBC: Recent Labs  Lab 10/20/20 1638 10/21/20 0503  WBC 3.6*  --   NEUTROABS 2.7  --   HGB 11.4* 11.2*  HCT 36.4 33.0*  MCV 91.5  --   PLT 161  --    Basic Metabolic Panel: Recent Labs  Lab 10/20/20 1638 10/21/20 0503  NA 137 140  K 3.0* 2.9*  CL 99  --   CO2 25  --   GLUCOSE 174*  --   BUN 23  --   CREATININE 1.36*  --   CALCIUM 9.6  --    GFR: Estimated Creatinine Clearance: 18.9 mL/min (A) (by C-G formula based on SCr of 1.36 mg/dL (H)).  Liver Function Tests: Recent Labs  Lab 10/20/20 1638  AST 24  ALT 16  ALKPHOS 42  BILITOT 0.4  PROT 7.2  ALBUMIN 3.9    Recent Labs  Lab 10/20/20 2127  AMMONIA 31  Coagulation Profile: Recent Labs  Lab 10/20/20 1638  INR 1.1    HbA1C: Hgb A1c MFr Bld  Date/Time Value Ref Range Status  10/26/2017 04:14 PM 8.0 (H) 4.8 - 5.6 % Final    Comment:    (NOTE) Pre diabetes:          5.7%-6.4% Diabetes:              >6.4% Glycemic control for   <7.0% adults with diabetes   10/17/2017 09:38 AM 8.5 (H) 4.8 - 5.6 % Final    Comment:    (NOTE) Pre diabetes:          5.7%-6.4% Diabetes:              >6.4% Glycemic control for   <7.0% adults with diabetes     CBG: No results for input(s): GLUCAP in the last 168 hours.  Recent Results (from the past 240 hour(s))  Culture, blood (Routine x 2)     Status: None (Preliminary result)   Collection Time: 10/20/20  5:00 PM   Specimen: BLOOD RIGHT ARM  Result Value Ref Range Status   Specimen Description BLOOD RIGHT ARM  Final   Special Requests   Final    BOTTLES DRAWN AEROBIC AND ANAEROBIC Blood Culture results may not be  optimal due to an inadequate volume of blood received in culture bottles   Culture   Final    NO GROWTH < 12 HOURS Performed at [facility], [address] [place], [place] [address]    Report Status PENDING  Incomplete  Culture, blood (Routine x 2)     Status: None (Preliminary result)   Collection Time: 10/20/20  5:00 PM   Specimen: BLOOD LEFT ARM  Result Value Ref Range Status   Specimen Description BLOOD LEFT ARM  Final   Special Requests   Final    BOTTLES DRAWN AEROBIC AND ANAEROBIC Blood Culture results may not be optimal due to an inadequate volume of blood received in culture bottles   Culture   Final    NO GROWTH < 12 HOURS Performed at [facility], [address] [place], [place] [address]    Report Status PENDING  Incomplete     Scheduled Meds: . vitamin C  500 mg Oral Daily  . cholecalciferol  1,000 Units Oral Daily  . enoxaparin (LOVENOX) injection  30 mg Subcutaneous Q24H  . insulin aspart  0-5 Units Subcutaneous QHS  . insulin aspart  0-9 Units Subcutaneous TID WC  . zinc sulfate  220 mg Oral Daily   Continuous Infusions: . sodium chloride 125 mL/hr at 10/21/20 0449  . [START ON 10/22/2020] remdesivir 100 mg in NS 100 mL       LOS: 1 day   Lonia Blood, MD [facility] Office  [phone] Pager - Text Page per Amion  If 7PM-7AM, please contact night-coverage per Amion 10/21/2020, 8:14 AM

## 2020-10-21 NOTE — ED Notes (Signed)
Pt resting.

## 2020-10-21 NOTE — ED Notes (Signed)
Pt returned from mri  She is keeping her gown wrapped all aroumd her no verbal communication all wires hooked back up includfing a pure wick

## 2020-10-21 NOTE — ED Notes (Signed)
Report called to inpatient unit. Awaiting transport at this time.

## 2020-10-22 DIAGNOSIS — U071 COVID-19: Secondary | ICD-10-CM | POA: Diagnosis not present

## 2020-10-22 LAB — FERRITIN: Ferritin: 260 ng/mL (ref 11–307)

## 2020-10-22 LAB — CBC
HCT: 34.4 % — ABNORMAL LOW (ref 36.0–46.0)
Hemoglobin: 11.7 g/dL — ABNORMAL LOW (ref 12.0–15.0)
MCH: 29.9 pg (ref 26.0–34.0)
MCHC: 34 g/dL (ref 30.0–36.0)
MCV: 88 fL (ref 80.0–100.0)
Platelets: 140 10*3/uL — ABNORMAL LOW (ref 150–400)
RBC: 3.91 MIL/uL (ref 3.87–5.11)
RDW: 13.5 % (ref 11.5–15.5)
WBC: 4.1 10*3/uL (ref 4.0–10.5)
nRBC: 0 % (ref 0.0–0.2)

## 2020-10-22 LAB — COMPREHENSIVE METABOLIC PANEL
ALT: 18 U/L (ref 0–44)
AST: 37 U/L (ref 15–41)
Albumin: 3.2 g/dL — ABNORMAL LOW (ref 3.5–5.0)
Alkaline Phosphatase: 34 U/L — ABNORMAL LOW (ref 38–126)
Anion gap: 8 (ref 5–15)
BUN: 14 mg/dL (ref 8–23)
CO2: 26 mmol/L (ref 22–32)
Calcium: 8.3 mg/dL — ABNORMAL LOW (ref 8.9–10.3)
Chloride: 103 mmol/L (ref 98–111)
Creatinine, Ser: 1.1 mg/dL — ABNORMAL HIGH (ref 0.44–1.00)
GFR, Estimated: 47 mL/min — ABNORMAL LOW (ref >60)
Glucose, Bld: 149 mg/dL — ABNORMAL HIGH (ref 70–99)
Potassium: 3 mmol/L — ABNORMAL LOW (ref 3.5–5.1)
Sodium: 137 mmol/L (ref 135–145)
Total Bilirubin: 0.5 mg/dL (ref 0.3–1.2)
Total Protein: 6.2 g/dL — ABNORMAL LOW (ref 6.5–8.1)

## 2020-10-22 LAB — RETICULOCYTES
Immature Retic Fract: 3.9 % (ref 2.3–15.9)
RBC.: 3.97 MIL/uL (ref 3.87–5.11)
Retic Count, Absolute: 31 10*3/uL (ref 19.0–186.0)
Retic Ct Pct: 0.8 % (ref 0.4–3.1)

## 2020-10-22 LAB — GLUCOSE, CAPILLARY
Glucose-Capillary: 148 mg/dL — ABNORMAL HIGH (ref 70–99)
Glucose-Capillary: 114 mg/dL — ABNORMAL HIGH (ref 70–99)
Glucose-Capillary: 192 mg/dL — ABNORMAL HIGH (ref 70–99)
Glucose-Capillary: 214 mg/dL — ABNORMAL HIGH (ref 70–99)

## 2020-10-22 LAB — URINE CULTURE: Culture: NO GROWTH

## 2020-10-22 LAB — FOLATE: Folate: 12 ng/mL (ref >5.9)

## 2020-10-22 LAB — HEMOGLOBIN A1C
Hgb A1c MFr Bld: 7.2 % — ABNORMAL HIGH (ref 4.8–5.6)
Mean Plasma Glucose: 159.94 mg/dL

## 2020-10-22 LAB — TSH: TSH: 0.847 u[IU]/mL (ref 0.350–4.500)

## 2020-10-22 LAB — C-REACTIVE PROTEIN: CRP: 8.8 mg/dL — ABNORMAL HIGH (ref <1.0)

## 2020-10-22 LAB — IRON AND TIBC
Iron: 17 ug/dL — ABNORMAL LOW (ref 28–170)
Saturation Ratios: 7 % — ABNORMAL LOW (ref 10.4–31.8)
TIBC: 246 ug/dL — ABNORMAL LOW (ref 250–450)
UIBC: 229 ug/dL

## 2020-10-22 LAB — D-DIMER, QUANTITATIVE: D-Dimer, Quant: 1.08 ug/mL-FEU — ABNORMAL HIGH (ref 0.00–0.50)

## 2020-10-22 LAB — MAGNESIUM: Magnesium: 1.6 mg/dL — ABNORMAL LOW (ref 1.7–2.4)

## 2020-10-22 LAB — VITAMIN B12: Vitamin B-12: 716 pg/mL (ref 180–914)

## 2020-10-22 MED ORDER — POTASSIUM CHLORIDE CRYS ER 20 MEQ PO TBCR
40.0000 meq | EXTENDED_RELEASE_TABLET | Freq: Two times a day (BID) | ORAL | Status: AC
  Administered 2020-10-22 – 2020-10-23 (×3): 40 meq via ORAL
  Filled 2020-10-22 (×3): qty 2

## 2020-10-22 MED ORDER — MAGNESIUM SULFATE 2 GM/50ML IV SOLN
2.0000 g | Freq: Once | INTRAVENOUS | Status: AC
  Administered 2020-10-22: 2 g via INTRAVENOUS
  Filled 2020-10-22: qty 50

## 2020-10-22 MED ORDER — METOPROLOL TARTRATE 5 MG/5ML IV SOLN
5.0000 mg | Freq: Four times a day (QID) | INTRAVENOUS | Status: DC
  Administered 2020-10-22 – 2020-10-24 (×5): 5 mg via INTRAVENOUS
  Filled 2020-10-22 (×6): qty 5

## 2020-10-22 NOTE — Plan of Care (Signed)
  Problem: Clinical Measurements: Goal: Ability to maintain clinical measurements within normal limits will improve Outcome: Not Progressing Goal: Diagnostic test results will improve Outcome: Not Progressing   Problem: Pain Managment: Goal: General experience of comfort will improve Outcome: Not Progressing   Problem: Skin Integrity: Goal: Risk for impaired skin integrity will decrease Outcome: Not Progressing

## 2020-10-22 NOTE — Progress Notes (Signed)
Telemetry called stating patient was in [place]. RN went to check on patient, found that ECG rhythm was alternating between afib and vtach. MD paged and STAT EKG ordered. EKG showed afib rhythm. RN noticed runs of Vtach becoming more frequent. Charge RN paged rapid response while RN paged MD to come see patient. MD at bedside.

## 2020-10-22 NOTE — Progress Notes (Signed)
Alicia Vang  VOJ:[id number] DOB: May 08, 1927 DOA: 10/20/2020 PCP: System, Provider Not In    Brief Narrative:  86 year old with a history of HTN and DM2 who presented to the ED with lethargy and confusion.  Patient's daughter had noticed a cough for a few days and worsening fatigue.  At baseline she is ambulatory and fully independent.  On the date of her admission she began to behave oddly and had clearly become significantly confused.  In the ED she is found to have a temperature of 101.2.  Covid testing was positive but she was not hypoxic.  There was no acute infiltrate on CXR.  Head CT was without acute findings and brain MRI was also nonacute.  Significant Events:  12/29 presented to [facility] ED 12/30 admit to [facility]  Date of Positive COVID Test:  12/29  Vaccination Status: Vaccine x2 -unclear booster status  COVID-19 specific Treatment: Remdesivir 12/29 >  Antimicrobials:  None  DVT prophylaxis: Lovenox  Subjective: Has experienced transient fever to 102 since admission.  Appears saturations are consistently remaining in the upper 90s with only episodic occasional dips not likely to represent true persisting hypoxia.  Is hard of hearing.  States she feels poorly in general but is not more specific.  Does not appear to be in acute distress.  Assessment & Plan:  Acute Covid infection  Continue Remdesivir -monitor for hypoxia which would indicate addition of steroid -no role for other therapies presently  Recent Labs  Lab 10/20/20 1638 10/22/20 0411  DDIMER  --  1.08*  FERRITIN  --  260  CRP  --  8.8*  ALT 16 18     Acute metabolic encephalopathy -acute delirium No acute findings on CT or MRI head -ammonia normal -no hypoglycemia -no evidence of UTI -felt to be a consequence of dehydration, and Covid itself  Sinus bradycardia Heart rate 40-50 during ED visit -metoprolol on hold -heart rate stable  Acute kidney injury Improving consistently with volume  expansion - follow trend   Recent Labs  Lab 10/20/20 1638 10/22/20 0411  CREATININE 1.36* 1.10*    Normocytic anemia Anemia panel most consistent with anemia of chronic disease, likely poor nutrition in setting of advanced age  Dehydration Due to Covid/poor intake -continue to supplement with IV fluid  Hypokalemia Continue replacement therapy and follow  Hypomagnesemia Replace and follow  HTN Blood pressure well controlled at present  DM 2 CBG reasonably controlled -continue to monitor -A1c 7.2  Code Status: FULL CODE Family Communication:  Status is: Inpatient  Remains inpatient appropriate because:IV treatments appropriate due to intensity of illness or inability to take PO   Dispo: The patient is from: Home              Anticipated d/c is to: unclear              Anticipated d/c date is: 2 days              Patient currently is not medically stable to d/c.  Consultants:  none  Objective: Blood pressure 116/62, pulse 65, temperature 99.6 F (37.6 C), temperature source Oral, resp. rate 16, height 5\' 5"  (1.651 m), weight 44.5 kg, SpO2 100 %.  Intake/Output Summary (Last 24 hours) at 10/22/2020 0837 Last data filed at 10/22/2020 0500 Gross per 24 hour  Intake 1796.35 ml  Output 1300 ml  Net 496.35 ml   Filed Weights   10/20/20 1635  Weight: 44.5 kg   General: No acute respiratory  distress Lungs: Clear to auscultation bilaterally without wheezes or crackles Cardiovascular: Regular rate and rhythm without murmur gallop or rub normal S1 and S2 Abdomen: Nontender, nondistended, soft, bowel sounds positive, no rebound, no ascites, no appreciable mass Extremities: No significant cyanosis, clubbing, or edema bilateral lower extremities   CBC: Recent Labs  Lab 10/20/20 1638 10/21/20 0503 10/22/20 0411  WBC 3.6*  --  4.1  NEUTROABS 2.7  --   --   HGB 11.4* 11.2* 11.7*  HCT 36.4 33.0* 34.4*  MCV 91.5  --  88.0  PLT 161  --  140*   Basic Metabolic  Panel: Recent Labs  Lab 10/20/20 1638 10/21/20 0503 10/22/20 0411  NA 137 140 137  K 3.0* 2.9* 3.0*  CL 99  --  103  CO2 25  --  26  GLUCOSE 174*  --  149*  BUN 23  --  14  CREATININE 1.36*  --  1.10*  CALCIUM 9.6  --  8.3*  MG  --   --  1.6*   GFR: Estimated Creatinine Clearance: 23.4 mL/min (A) (by C-G formula based on SCr of 1.1 mg/dL (H)).  Liver Function Tests: Recent Labs  Lab 10/20/20 1638 10/22/20 0411  AST 24 37  ALT 16 18  ALKPHOS 42 34*  BILITOT 0.4 0.5  PROT 7.2 6.2*  ALBUMIN 3.9 3.2*    Recent Labs  Lab 10/20/20 2127  AMMONIA 31    Coagulation Profile: Recent Labs  Lab 10/20/20 1638  INR 1.1    HbA1C: Hgb A1c MFr Bld  Date/Time Value Ref Range Status  10/22/2020 04:11 AM 7.2 (H) 4.8 - 5.6 % Final    Comment:    (NOTE) Pre diabetes:          5.7%-6.4%  Diabetes:              >6.4%  Glycemic control for   <7.0% adults with diabetes   10/26/2017 04:14 PM 8.0 (H) 4.8 - 5.6 % Final    Comment:    (NOTE) Pre diabetes:          5.7%-6.4% Diabetes:              >6.4% Glycemic control for   <7.0% adults with diabetes     CBG: Recent Labs  Lab 10/21/20 0822 10/21/20 1237 10/21/20 1705 10/21/20 2157 10/22/20 0613  GLUCAP 149* 197* 162* 191* 148*    Recent Results (from the past 240 hour(s))  Culture, blood (Routine x 2)     Status: None (Preliminary result)   Collection Time: 10/20/20  5:00 PM   Specimen: BLOOD RIGHT ARM  Result Value Ref Range Status   Specimen Description BLOOD RIGHT ARM  Final   Special Requests   Final    BOTTLES DRAWN AEROBIC AND ANAEROBIC Blood Culture results may not be optimal due to an inadequate volume of blood received in culture bottles   Culture   Final    NO GROWTH 2 DAYS Performed at [facility], [address], [place], [place] [address]    Report Status PENDING  Incomplete  Culture, blood (Routine x 2)     Status: None (Preliminary result)   Collection Time: 10/20/20  5:00 PM    Specimen: BLOOD LEFT ARM  Result Value Ref Range Status   Specimen Description BLOOD LEFT ARM  Final   Special Requests   Final    BOTTLES DRAWN AEROBIC AND ANAEROBIC Blood Culture results may not be optimal due to  an inadequate volume of blood received in culture bottles   Culture   Final    NO GROWTH 2 DAYS Performed at [facility], [address] [place], [place] [address]    Report Status PENDING  Incomplete     Scheduled Meds: . vitamin C  500 mg Oral Daily  . aspirin EC  325 mg Oral BID  . cholecalciferol  1,000 Units Oral Daily  . cycloSPORINE  1 drop Both Eyes BID  . enoxaparin (LOVENOX) injection  30 mg Subcutaneous Q24H  . hydrALAZINE  10 mg Oral Daily  . insulin aspart  0-5 Units Subcutaneous QHS  . insulin aspart  0-9 Units Subcutaneous TID WC  . latanoprost  1 drop Both Eyes QHS  . linagliptin  5 mg Oral Daily  . zinc sulfate  220 mg Oral Daily   Continuous Infusions: . 0.9 % NaCl with KCl 40 mEq / L 75 mL/hr at 10/21/20 2308  . remdesivir 100 mg in NS 100 mL       LOS: 2 days   Lonia Blood, MD [facility] Office  [phone] Pager - Text Page per Amion  If 7PM-7AM, please contact night-coverage per Amion 10/22/2020, 8:37 AM

## 2020-10-23 DIAGNOSIS — U071 COVID-19: Secondary | ICD-10-CM | POA: Diagnosis not present

## 2020-10-23 LAB — COMPREHENSIVE METABOLIC PANEL
ALT: 16 U/L (ref 0–44)
AST: 34 U/L (ref 15–41)
Albumin: 2.8 g/dL — ABNORMAL LOW (ref 3.5–5.0)
Alkaline Phosphatase: 36 U/L — ABNORMAL LOW (ref 38–126)
Anion gap: 8 (ref 5–15)
BUN: 13 mg/dL (ref 8–23)
CO2: 25 mmol/L (ref 22–32)
Calcium: 8.1 mg/dL — ABNORMAL LOW (ref 8.9–10.3)
Chloride: 105 mmol/L (ref 98–111)
Creatinine, Ser: 1.03 mg/dL — ABNORMAL HIGH (ref 0.44–1.00)
GFR, Estimated: 51 mL/min — ABNORMAL LOW (ref >60)
Glucose, Bld: 163 mg/dL — ABNORMAL HIGH (ref 70–99)
Potassium: 4 mmol/L (ref 3.5–5.1)
Sodium: 138 mmol/L (ref 135–145)
Total Bilirubin: 0.3 mg/dL (ref 0.3–1.2)
Total Protein: 5.8 g/dL — ABNORMAL LOW (ref 6.5–8.1)

## 2020-10-23 LAB — CBC
HCT: 31.6 % — ABNORMAL LOW (ref 36.0–46.0)
Hemoglobin: 10.7 g/dL — ABNORMAL LOW (ref 12.0–15.0)
MCH: 29.8 pg (ref 26.0–34.0)
MCHC: 33.9 g/dL (ref 30.0–36.0)
MCV: 88 fL (ref 80.0–100.0)
Platelets: 146 10*3/uL — ABNORMAL LOW (ref 150–400)
RBC: 3.59 MIL/uL — ABNORMAL LOW (ref 3.87–5.11)
RDW: 13.6 % (ref 11.5–15.5)
WBC: 3.6 10*3/uL — ABNORMAL LOW (ref 4.0–10.5)
nRBC: 0 % (ref 0.0–0.2)

## 2020-10-23 LAB — GLUCOSE, CAPILLARY
Glucose-Capillary: 152 mg/dL — ABNORMAL HIGH (ref 70–99)
Glucose-Capillary: 121 mg/dL — ABNORMAL HIGH (ref 70–99)
Glucose-Capillary: 174 mg/dL — ABNORMAL HIGH (ref 70–99)
Glucose-Capillary: 131 mg/dL — ABNORMAL HIGH (ref 70–99)
Glucose-Capillary: 137 mg/dL — ABNORMAL HIGH (ref 70–99)

## 2020-10-23 LAB — C-REACTIVE PROTEIN: CRP: 10.7 mg/dL — ABNORMAL HIGH (ref <1.0)

## 2020-10-23 LAB — MAGNESIUM: Magnesium: 2.3 mg/dL (ref 1.7–2.4)

## 2020-10-23 LAB — D-DIMER, QUANTITATIVE: D-Dimer, Quant: 0.83 ug/mL-FEU — ABNORMAL HIGH (ref 0.00–0.50)

## 2020-10-23 NOTE — Progress Notes (Signed)
Alicia Vang  NFA:[id number] DOB: 01/18/1927 DOA: 10/20/2020 PCP: System, Provider Not In    Brief Narrative:  87 year old with a history of HTN and DM2 who presented to the ED with lethargy and confusion.  Patient's daughter had noticed a cough for a few days and worsening fatigue.  At baseline she is ambulatory and fully independent.  On the date of her admission she began to behave oddly and had clearly become significantly confused.  In the ED she is found to have a temperature of 101.2.  Covid testing was positive but she was not hypoxic.  There was no acute infiltrate on CXR.  Head CT was without acute findings and brain MRI was also nonacute.  Significant Events:  12/29 presented to [facility] ED 12/30 admit to [facility]  Date of Positive COVID Test:  12/29  Vaccination Status: Vaccine x2 -unclear booster status  COVID-19 specific Treatment: Remdesivir 12/29 >  Antimicrobials:  None  DVT prophylaxis: Lovenox  Subjective: Some ectopy was noted on telemetry yesterday afternoon, prompting a bedside visit from this physician.  Patient remained completely stable throughout the episodes with preserved blood pressure and no symptoms.  Telemetry and EKG tracing were suggestive of PVCs, PACs, and possible intermittent atrial flutter.  Vital signs remained stable this morning with heart rate now better controlled.  Oxygen saturations remain in the upper 90% range on room air.  More alert and interactive today.  Appears to be approaching her baseline.  Assessment & Plan:  Acute Covid infection  Continue Remdesivir -monitor for hypoxia which would indicate addition of steroid -no role for other therapies presently -mobilize  Recent Labs  Lab 10/20/20 1638 10/22/20 0411 10/23/20 0140  DDIMER  --  1.08* 0.83*  FERRITIN  --  260  --   CRP  --  8.8* 10.7*  ALT 16 18 16      Acute metabolic encephalopathy -acute delirium No acute findings on CT or MRI head -ammonia normal -no  hypoglycemia -no evidence of UTI -felt to be a consequence of dehydration, and Covid itself -appears to be improving consistently  Sinus bradycardia Heart rate 40-50 during ED visit -metoprolol transiently placed on hold resulting in reflex tachycardia and increasing atrial and ventricular arrhythmias therefore beta-blocker resumed and patient tolerating well  Acute kidney injury Improving consistently with volume expansion - follow trend   Recent Labs  Lab 10/20/20 1638 10/22/20 0411 10/23/20 0140  CREATININE 1.36* 1.10* 1.03*    Normocytic anemia Anemia panel most consistent with anemia of chronic disease, likely poor nutrition in setting of advanced age  Dehydration Due to Covid/poor intake -continue to supplement with IV fluid  Hypokalemia Corrected with supplementation  Hypomagnesemia Corrected with supplementation  HTN Blood pressure well controlled at present  DM 2 CBG reasonably controlled -continue to monitor -A1c 7.2  Code Status: FULL CODE Family Communication:  Status is: Inpatient  Remains inpatient appropriate because:IV treatments appropriate due to intensity of illness or inability to take PO   Dispo: The patient is from: Home              Anticipated d/c is to: unclear              Anticipated d/c date is: 2 days              Patient currently is not medically stable to d/c.  Consultants:  none  Objective: Blood pressure (!) 109/55, pulse (!) 53, temperature 98.8 F (37.1 C), temperature source Oral, resp. rate 18,  height 5\' 5"  (1.651 m), weight 44.5 kg, SpO2 92 %. No intake or output data in the 24 hours ending 10/23/20 0849 Filed Weights   10/20/20 1635  Weight: 44.5 kg   General: No acute respiratory distress Lungs: CTA B - no wheezing  Cardiovascular: Regular rate - frequent ectopic beats - no M  Abdomen: NT/ND, soft, BS+  Extremities: No signif edema bilateral lower extremities   CBC: Recent Labs  Lab 10/20/20 1638  10/21/20 0503 10/22/20 0411 10/23/20 0140  WBC 3.6*  --  4.1 3.6*  NEUTROABS 2.7  --   --   --   HGB 11.4* 11.2* 11.7* 10.7*  HCT 36.4 33.0* 34.4* 31.6*  MCV 91.5  --  88.0 88.0  PLT 161  --  140* 222*   Basic Metabolic Panel: Recent Labs  Lab 10/20/20 1638 10/21/20 0503 10/22/20 0411 10/23/20 0140  NA 137 140 137 138  K 3.0* 2.9* 3.0* 4.0  CL 99  --  103 105  CO2 25  --  26 25  GLUCOSE 174*  --  149* 163*  BUN 23  --  14 13  CREATININE 1.36*  --  1.10* 1.03*  CALCIUM 9.6  --  8.3* 8.1*  MG  --   --  1.6* 2.3   GFR: Estimated Creatinine Clearance: 25 mL/min (A) (by C-G formula based on SCr of 1.03 mg/dL (H)).  Liver Function Tests: Recent Labs  Lab 10/20/20 1638 10/22/20 0411 10/23/20 0140  AST 24 37 34  ALT 16 18 16   ALKPHOS 42 34* 36*  BILITOT 0.4 0.5 0.3  PROT 7.2 6.2* 5.8*  ALBUMIN 3.9 3.2* 2.8*    Recent Labs  Lab 10/20/20 2127  AMMONIA 31    Coagulation Profile: Recent Labs  Lab 10/20/20 1638  INR 1.1    HbA1C: Hgb A1c MFr Bld  Date/Time Value Ref Range Status  10/22/2020 04:11 AM 7.2 (H) 4.8 - 5.6 % Final    Comment:    (NOTE) Pre diabetes:          5.7%-6.4%  Diabetes:              >6.4%  Glycemic control for   <7.0% adults with diabetes   10/26/2017 04:14 PM 8.0 (H) 4.8 - 5.6 % Final    Comment:    (NOTE) Pre diabetes:          5.7%-6.4% Diabetes:              >6.4% Glycemic control for   <7.0% adults with diabetes     CBG: Recent Labs  Lab 10/22/20 0613 10/22/20 1201 10/22/20 1626 10/22/20 2132 10/23/20 0622  GLUCAP 148* 114* 192* 214* 152*    Recent Results (from the past 240 hour(s))  Culture, blood (Routine x 2)     Status: None (Preliminary result)   Collection Time: 10/20/20  5:00 PM   Specimen: BLOOD RIGHT ARM  Result Value Ref Range Status   Specimen Description BLOOD RIGHT ARM  Final   Special Requests   Final    BOTTLES DRAWN AEROBIC AND ANAEROBIC Blood Culture results may not be optimal due to an  inadequate volume of blood received in culture bottles   Culture   Final    NO GROWTH 2 DAYS Performed at [facility], [address] [place], [place] [address]    Report Status PENDING  Incomplete  Culture, blood (Routine x 2)     Status: None (Preliminary result)  Collection Time: 10/20/20  5:00 PM   Specimen: BLOOD LEFT ARM  Result Value Ref Range Status   Specimen Description BLOOD LEFT ARM  Final   Special Requests   Final    BOTTLES DRAWN AEROBIC AND ANAEROBIC Blood Culture results may not be optimal due to an inadequate volume of blood received in culture bottles   Culture   Final    NO GROWTH 2 DAYS Performed at [facility], [address] [place], [place] [address]    Report Status PENDING  Incomplete  Urine culture     Status: None   Collection Time: 10/20/20 10:55 PM   Specimen: Urine, Random  Result Value Ref Range Status   Specimen Description URINE, RANDOM  Final   Special Requests NONE  Final   Culture   Final    NO GROWTH Performed at [facility], [address], [place], [place] [address]    Report Status 10/22/2020 FINAL  Final     Scheduled Meds: . vitamin C  500 mg Oral Daily  . aspirin EC  325 mg Oral BID  . cholecalciferol  1,000 Units Oral Daily  . cycloSPORINE  1 drop Both Eyes BID  . enoxaparin (LOVENOX) injection  30 mg Subcutaneous Q24H  . hydrALAZINE  10 mg Oral Daily  . insulin aspart  0-5 Units Subcutaneous QHS  . insulin aspart  0-9 Units Subcutaneous TID WC  . latanoprost  1 drop Both Eyes QHS  . linagliptin  5 mg Oral Daily  . metoprolol tartrate  5 mg Intravenous Q6H  . potassium chloride  40 mEq Oral BID  . zinc sulfate  220 mg Oral Daily   Continuous Infusions: . 0.9 % NaCl with KCl 40 mEq / L 60 mL/hr at 10/23/20 0201  . remdesivir 100 mg in NS 100 mL 100 mg (10/22/20 1206)     LOS: 3 days   Lonia Blood, MD [facility] Office  [phone] Pager - Text Page per Amion  If 7PM-7AM,  please contact night-coverage per Amion 10/23/2020, 8:49 AM

## 2020-10-23 NOTE — Evaluation (Signed)
Physical Therapy Evaluation Patient Details Name: Alicia Vang MRN: [id number] DOB: Mar 31, 1927 Today's Date: 10/23/2020   History of Present Illness  87 year old with a history of HTN and DM2 who presented to the ED with lethargy and confusion. At baseline she is ambulatory and fully independent. Pt found to be COVID+.  Clinical Impression  Pt presents to PT with deficits in strength, power, gait, balance, endurance, functional mobility. Difficult to determine baseline cognitive status as no family present, pt follows commands well and is oriented x4. Pt does report differing home setup from that of previous admission in 2019, pt reports will need to be confirmed with family. Pt is generally weak and requires increased time to perform all functional mobility and ambulation tasks. Pt also requires some physical assistance initially during first transfer and with one minor LOB when turning during gait. Pt will benefit from aggressive mobilization and PT POC to reduce falls risk and to aide in a return to her prior level of function. PT recommends discharge home with HHPT, a 3in1 commode, and 24/7 assist from family. If family cannot provide 24/7 assist then SNF placement may need to be considered.    Follow Up Recommendations Home health PT;Supervision/Assistance - 24 hour    Equipment Recommendations  3in1 (PT)    Recommendations for Other Services       Precautions / Restrictions Precautions Precautions: Fall Precaution Comments: monitor HR, arrythmias Restrictions Weight Bearing Restrictions: No      Mobility  Bed Mobility Overal bed mobility: Needs Assistance Bed Mobility: Supine to Sit     Supine to sit: Supervision;HOB elevated     General bed mobility comments: increased time    Transfers Overall transfer level: Needs assistance Equipment used: Rolling walker (2 wheeled) Transfers: Sit to/from Stand Sit to Stand: Min assist;Min guard         General  transfer comment: minA initially, progressing to minG  Ambulation/Gait Ambulation/Gait assistance: Min assist;Min guard Gait Distance (Feet): 15 Feet Assistive device: Rolling walker (2 wheeled) Gait Pattern/deviations: Step-to pattern Gait velocity: reduced Gait velocity interpretation: <1.31 ft/sec, indicative of household ambulator General Gait Details: pt with short step-to gait, slowed speed  Stairs            Wheelchair Mobility    Modified Rankin (Stroke Patients Only)       Balance Overall balance assessment: Needs assistance Sitting-balance support: No upper extremity supported;Feet supported Sitting balance-[name] Scale: Fair     Standing balance support: Bilateral upper extremity supported Standing balance-[name] Scale: Poor Standing balance comment: reliant on UE support of RW                             Pertinent Vitals/Pain Pain Assessment: No/denies pain    Home Living Family/patient expects to be discharged to:: Private residence Living Arrangements: Children Available Help at Discharge: Family;Available PRN/intermittently Type of Home: House Home Access: Stairs to enter;Level entry (pt reports level entry, previous admission states 3 steps) Entrance Stairs-Rails: Left Entrance Stairs-Number of Steps: 3 Home Layout: Two level;Able to live on main level with bedroom/bathroom (pt reports her bedroom is upstairs, chart review from 2019 reports there is a first floor bedroom available if home setup is the same) Home Equipment: Walker - 4 wheels      Prior Function Level of Independence: Independent with assistive device(s)         Comments: pt ambulates with use of rollator     Hand  Dominance        Extremity/Trunk Assessment   Upper Extremity Assessment Upper Extremity Assessment: Generalized weakness    Lower Extremity Assessment Lower Extremity Assessment: Generalized weakness    Cervical / Trunk Assessment Cervical /  Trunk Assessment: Kyphotic  Communication   Communication: No difficulties  Cognition Arousal/Alertness: Awake/alert Behavior During Therapy: WFL for tasks assessed/performed Overall Cognitive Status: No family/caregiver present to determine baseline cognitive functioning                                 General Comments: pt is oriented to person, place, generally oriented to time, able to report she had a birthday last week. Pt providing different home setup information compared to previous admission in 2019, will need to confirm with family.      General Comments General comments (skin integrity, edema, etc.): HR in 60s with activity, rhythm reads A-fib when changing position but is NSR when ambulating or resting    Exercises     Assessment/Plan    PT Assessment Patient needs continued PT services  PT Problem List Decreased strength;Decreased activity tolerance;Decreased balance;Decreased mobility;Decreased cognition;Cardiopulmonary status limiting activity       PT Treatment Interventions DME instruction;Gait training;Stair training;Functional mobility training;Therapeutic activities;Therapeutic exercise;Balance training;Patient/family education    PT Goals (Current goals can be found in the Care Plan section)  Acute Rehab PT Goals Patient Stated Goal: to improve mobility PT Goal Formulation: With patient Time For Goal Achievement: 11/06/20 Potential to Achieve Goals: Good    Frequency Min 3X/week   Barriers to discharge        Co-evaluation               AM-PAC PT "6 Clicks" Mobility  Outcome Measure Help needed turning from your back to your side while in a flat bed without using bedrails?: A Little Help needed moving from lying on your back to sitting on the side of a flat bed without using bedrails?: A Little Help needed moving to and from a bed to a chair (including a wheelchair)?: A Little Help needed standing up from a chair using your arms  (e.g., wheelchair or bedside chair)?: A Little Help needed to walk in hospital room?: A Little Help needed climbing 3-5 steps with a railing? : A Lot 6 Click Score: 17    End of Session   Activity Tolerance: Patient tolerated treatment well Patient left: in chair;with call bell/phone within reach;with chair alarm set Nurse Communication: Mobility status PT Visit Diagnosis: Other abnormalities of gait and mobility (R26.89);Muscle weakness (generalized) (M62.81)    Time: 9373-4287 PT Time Calculation (min) (ACUTE ONLY): 27 min   Charges:   PT Evaluation $PT Eval Low Complexity: 1 Low          Arlyss Gandy, PT, DPT Acute Rehabilitation Pager: [phone]   Arlyss Gandy 10/23/2020, 9:13 AM

## 2020-10-23 NOTE — Evaluation (Signed)
Occupational Therapy Evaluation Patient Details Name: Alicia Vang MRN: [id number] DOB: [birth year] Today's Date: 10/23/2020    History of Present Illness 87 year old with a history of HTN and DM2 who presented to the ED with lethargy and confusion. At baseline she is ambulatory and fully independent. Pt found to be COVID+.   Clinical Impression   PT admitted with COVID confusion. Pt currently with functional limitiations due to the deficits listed below (see OT problem list). Pt currently with confusion and requires (A) for toilet transfer mod (A) on standard commode. Recommend use of 3n1 and RW.  Pt will benefit from skilled OT to increase their independence and safety with adls and balance to allow discharge hhot.     Follow Up Recommendations  Home health OT    Equipment Recommendations  3 in 1 bedside commode    Recommendations for Other Services       Precautions / Restrictions Precautions Precautions: Fall Precaution Comments: monitor HR, arrythmias      Mobility Bed Mobility Overal bed mobility: Needs Assistance Bed Mobility: Supine to Sit;Sit to Supine     Supine to sit: Mod assist Sit to supine: Min assist   General bed mobility comments: pt requires (A) and pad use to help progress to eob. pt unable to pull trunk off surface    Transfers Overall transfer level: Needs assistance Equipment used: Rolling walker (2 wheeled) Transfers: Sit to/from Stand Sit to Stand: Min assist         General transfer comment: needs multiple verbal cues    Balance Overall balance assessment: Needs assistance         Standing balance support: Bilateral upper extremity supported;During functional activity Standing balance-[name] Scale: Poor Standing balance comment: reliant on UE support of RW                           ADL either performed or assessed with clinical judgement   ADL Overall ADL's : Needs assistance/impaired Eating/Feeding: Set  up;Bed level Eating/Feeding Details (indicate cue type and reason): noted to have wet blanket from spilling water Grooming: Wash/dry hands;Minimal assistance;Standing Grooming Details (indicate cue type and reason): mod cues to sequence                 Toilet Transfer: Moderate assistance;Regular Toilet;Grab bars Toilet Transfer Details (indicate cue type and reason): requires (A) To control descend to surface adn elevate from surface Toileting- Clothing Manipulation and Hygiene: Moderate assistance;Sit to/from stand Toileting - Clothing Manipulation Details (indicate cue type and reason): standing for peri care     Functional mobility during ADLs: Minimal assistance;Rolling walker General ADL Comments: pt on phone on arrival and     Vision         Perception     Praxis      Pertinent Vitals/Pain Pain Assessment: No/denies pain     Hand Dominance Right   Extremity/Trunk Assessment Upper Extremity Assessment Upper Extremity Assessment: Generalized weakness   Lower Extremity Assessment Lower Extremity Assessment: Generalized weakness   Cervical / Trunk Assessment Cervical / Trunk Assessment: Kyphotic   Communication Communication Communication: No difficulties   Cognition Arousal/Alertness: Awake/alert Behavior During Therapy: WFL for tasks assessed/performed Overall Cognitive Status: No family/caregiver present to determine baseline cognitive functioning                                 General Comments: pt demonstrates  some confusion by asking therapist if they live close to her here. she also makes statement that therapist must be getting ready to retire.   General Comments  Hr 50-60s with activity    Exercises     Shoulder Instructions      Home Living Family/patient expects to be discharged to:: Private residence Living Arrangements: Children Available Help at Discharge: Family;Available PRN/intermittently Type of Home: House Home  Access: Stairs to enter;Level entry Entrance Stairs-Number of Steps: 3 Entrance Stairs-Rails: Left Home Layout: Two level;Able to live on main level with bedroom/bathroom Alternate Level Stairs-Number of Steps: flight             Home Equipment: Walker - 4 wheels          Prior Functioning/Environment Level of Independence: Independent with assistive device(s)        Comments: pt ambulates with use of rollator        OT Problem List: Decreased strength;Decreased activity tolerance;Impaired balance (sitting and/or standing);Decreased cognition;Decreased safety awareness;Decreased knowledge of precautions;Decreased knowledge of use of DME or AE;Cardiopulmonary status limiting activity      OT Treatment/Interventions: Self-care/ADL training;Therapeutic exercise;Energy conservation;DME and/or AE instruction;Manual therapy;Therapeutic activities;Cognitive remediation/compensation;Patient/family education;Balance training    OT Goals(Current goals can be found in the care plan section) Acute Rehab OT Goals Patient Stated Goal: to go to the bathroom OT Goal Formulation: Patient unable to participate in goal setting Time For Goal Achievement: 11/06/20 Potential to Achieve Goals: Fair  OT Frequency: Min 2X/week   Barriers to D/C:            Co-evaluation              AM-PAC OT "6 Clicks" Daily Activity     Outcome Measure Help from another person eating meals?: A Little Help from another person taking care of personal grooming?: A Little Help from another person toileting, which includes using toliet, bedpan, or urinal?: A Lot Help from another person bathing (including washing, rinsing, drying)?: A Little Help from another person to put on and taking off regular upper body clothing?: A Little Help from another person to put on and taking off regular lower body clothing?: A Lot 6 Click Score: 16   End of Session Equipment Utilized During Treatment: Rolling  walker Nurse Communication: Mobility status;Precautions  Activity Tolerance: Patient tolerated treatment well Patient left: in bed;with call bell/phone within reach;with bed alarm set  OT Visit Diagnosis: Unsteadiness on feet (R26.81);Muscle weakness (generalized) (M62.81)                Time: 0177-9390 OT Time Calculation (min): 44 min Charges:  OT General Charges $OT Visit: 1 Visit OT Evaluation $OT Eval Moderate Complexity: 1 Mod OT Treatments $Self Care/Home Management : 8-22 mins   Brynn, OTR/L  Acute Rehabilitation Services Pager: [phone] Office: [phone] .   Mateo Flow 10/23/2020, 3:49 PM

## 2020-10-23 NOTE — Plan of Care (Signed)
Not progressing at this time due to altered mental status and ongoing confusion.   Problem: Education: Goal: Knowledge of General Education information will improve Description: Including pain rating scale, medication(s)/side effects and non-pharmacologic comfort measures Outcome: Not Progressing   Problem: Health Behavior/Discharge Planning: Goal: Ability to manage health-related needs will improve Outcome: Not Progressing   Problem: Clinical Measurements: Goal: Ability to maintain clinical measurements within normal limits will improve Outcome: Not Progressing Goal: Diagnostic test results will improve Outcome: Not Progressing   Problem: Activity: Goal: Risk for activity intolerance will decrease Outcome: Not Progressing   Problem: Nutrition: Goal: Adequate nutrition will be maintained Outcome: Not Progressing   Problem: Elimination: Goal: Will not experience complications related to bowel motility Outcome: Not Progressing   Problem: Pain Managment: Goal: General experience of comfort will improve Outcome: Not Progressing   Problem: Safety: Goal: Ability to remain free from injury will improve Outcome: Not Progressing   Problem: Skin Integrity: Goal: Risk for impaired skin integrity will decrease Outcome: Not Progressing

## 2020-10-24 DIAGNOSIS — U071 COVID-19: Secondary | ICD-10-CM | POA: Diagnosis not present

## 2020-10-24 LAB — D-DIMER, QUANTITATIVE: D-Dimer, Quant: 1.4 ug/mL-FEU — ABNORMAL HIGH (ref 0.00–0.50)

## 2020-10-24 LAB — CBC
HCT: 33.1 % — ABNORMAL LOW (ref 36.0–46.0)
Hemoglobin: 10.4 g/dL — ABNORMAL LOW (ref 12.0–15.0)
MCH: 28.6 pg (ref 26.0–34.0)
MCHC: 31.4 g/dL (ref 30.0–36.0)
MCV: 90.9 fL (ref 80.0–100.0)
Platelets: 132 10*3/uL — ABNORMAL LOW (ref 150–400)
RBC: 3.64 MIL/uL — ABNORMAL LOW (ref 3.87–5.11)
RDW: 13.5 % (ref 11.5–15.5)
WBC: 3.6 10*3/uL — ABNORMAL LOW (ref 4.0–10.5)
nRBC: 0 % (ref 0.0–0.2)

## 2020-10-24 LAB — COMPREHENSIVE METABOLIC PANEL
ALT: 16 U/L (ref 0–44)
AST: 33 U/L (ref 15–41)
Albumin: 2.7 g/dL — ABNORMAL LOW (ref 3.5–5.0)
Alkaline Phosphatase: 33 U/L — ABNORMAL LOW (ref 38–126)
Anion gap: 7 (ref 5–15)
BUN: 18 mg/dL (ref 8–23)
CO2: 23 mmol/L (ref 22–32)
Calcium: 8.3 mg/dL — ABNORMAL LOW (ref 8.9–10.3)
Chloride: 108 mmol/L (ref 98–111)
Creatinine, Ser: 0.94 mg/dL (ref 0.44–1.00)
GFR, Estimated: 57 mL/min — ABNORMAL LOW (ref >60)
Glucose, Bld: 132 mg/dL — ABNORMAL HIGH (ref 70–99)
Potassium: 4.9 mmol/L (ref 3.5–5.1)
Sodium: 138 mmol/L (ref 135–145)
Total Bilirubin: 0.4 mg/dL (ref 0.3–1.2)
Total Protein: 5.6 g/dL — ABNORMAL LOW (ref 6.5–8.1)

## 2020-10-24 LAB — C-REACTIVE PROTEIN: CRP: 7.4 mg/dL — ABNORMAL HIGH (ref <1.0)

## 2020-10-24 LAB — GLUCOSE, CAPILLARY
Glucose-Capillary: 134 mg/dL — ABNORMAL HIGH (ref 70–99)
Glucose-Capillary: 153 mg/dL — ABNORMAL HIGH (ref 70–99)

## 2020-10-24 MED ORDER — ACETAMINOPHEN 325 MG PO TABS: 650.0000 mg | ORAL_TABLET | Freq: Four times a day (QID) | ORAL | Status: DC | PRN

## 2020-10-24 NOTE — Discharge Instructions (Signed)
Date of Positive COVID Test: 12/29  Date Quarantine Ends: 1/12    COVID-19 Frequently Asked Questions COVID-19 (coronavirus disease) is an infection that is caused by a large family of viruses. Some viruses cause illness in people and others cause illness in animals like camels, cats, and bats. In some cases, the viruses that cause illness in animals can spread to humans. Where did the coronavirus come from? In December 2019, [place] told the [employer] [facility]) of several cases of lung disease (human respiratory illness). These cases were linked to an open seafood and livestock market in the city of [place]. The link to the seafood and livestock market suggests that the virus may have spread from animals to humans. However, since that first outbreak in December, the virus has also been shown to spread from person to person. What is the name of the disease and the virus? Disease name Early on, this disease was called novel coronavirus. This is because scientists determined that the disease was caused by a new (novel) respiratory virus. The World Health Organization [facility]) has now named the disease COVID-19, or coronavirus disease. Virus name The virus that causes the disease is called severe acute respiratory syndrome coronavirus 2 (SARS-CoV-2). More information on disease and virus naming World Health Organization [facility]): www.who.int/emergencies/diseases/novel-coronavirus-2019/technical-guidance/naming-the-coronavirus-disease-(covid-2019)-and-the-virus-that-causes-it Who is at risk for complications from coronavirus disease? Some people may be at higher risk for complications from coronavirus disease. This includes older adults and people who have chronic diseases, such as heart disease, diabetes, and lung disease. If you are at higher risk for complications, take these extra precautions:  Stay home as much as possible.  Avoid social gatherings and travel.  Avoid close contact with  others. Stay at least 6 ft (2 m) away from others, if possible.  Wash your hands often with soap and water for at least 20 seconds.  Avoid touching your face, mouth, nose, or eyes.  Keep supplies on hand at home, such as food, medicine, and cleaning supplies.  If you must go out in public, wear a cloth face covering or face mask. Make sure your mask covers your nose and mouth. How does coronavirus disease spread? The virus that causes coronavirus disease spreads easily from person to person (is contagious). You may catch the virus by:  Breathing in droplets from an infected person. Droplets can be spread by a person breathing, speaking, singing, coughing, or sneezing.  Touching something, like a table or a doorknob, that was exposed to the virus (contaminated) and then touching your mouth, nose, or eyes. Can I get the virus from touching surfaces or objects? There is still a lot that we do not know about the virus that causes coronavirus disease. Scientists are basing a lot of information on what they know about similar viruses, such as:  Viruses cannot generally survive on surfaces for long. They need a human body (host) to survive.  It is more likely that the virus is spread by close contact with people who are sick (direct contact), such as through: ? Shaking hands or hugging. ? Breathing in respiratory droplets that travel through the air. Droplets can be spread by a person breathing, speaking, singing, coughing, or sneezing.  It is less likely that the virus is spread when a person touches a surface or object that has the virus on it (indirect contact). The virus may be able to enter the body if the person touches a surface or object and then touches his or her face, eyes, nose,  or mouth. Can a person spread the virus without having symptoms of the disease? It may be possible for the virus to spread before a person has symptoms of the disease, but this is most likely not the main way  the virus is spreading. It is more likely for the virus to spread by being in close contact with people who are sick and breathing in the respiratory droplets spread by a person breathing, speaking, singing, coughing, or sneezing. What are the symptoms of coronavirus disease? Symptoms vary from person to person and can range from mild to severe. Symptoms may include:  Fever or chills.  Cough.  Difficulty breathing or feeling short of breath.  Headaches, body aches, or muscle aches.  Runny or stuffy (congested) nose.  Sore throat.  New loss of taste or smell.  Nausea, vomiting, or diarrhea. These symptoms can appear anywhere from 2 to 14 days after you have been exposed to the virus. Some people may not have any symptoms. If you develop symptoms, call your health care provider. People with severe symptoms may need hospital care. Should I be tested for this virus? Your health care provider will decide whether to test you based on your symptoms, history of exposure, and your risk factors. How does a health care provider test for this virus? Health care providers will collect samples to send for testing. Samples may include:  Taking a swab of fluid from the back of your nose and throat, your nose, or your throat.  Taking fluid from the lungs by having you cough up mucus (sputum) into a sterile cup.  Taking a blood sample. Is there a treatment or vaccine for this virus? Currently, there is no vaccine to prevent coronavirus disease. Also, there are no medicines like antibiotics or antivirals to treat the virus. A person who becomes sick is given supportive care, which means rest and fluids. A person may also relieve his or her symptoms by using over-the-counter medicines that treat sneezing, coughing, and runny nose. These are the same medicines that a person takes for the common cold. If you develop symptoms, call your health care provider. People with severe symptoms may need hospital  care. What can I do to protect myself and my family from this virus?     You can protect yourself and your family by taking the same actions that you would take to prevent the spread of other viruses. Take the following actions:  Wash your hands often with soap and water for at least 20 seconds. If soap and water are not available, use alcohol-based hand sanitizer.  Avoid touching your face, mouth, nose, or eyes.  Cough or sneeze into a tissue, sleeve, or elbow. Do not cough or sneeze into your hand or the air. ? If you cough or sneeze into a tissue, throw it away immediately and wash your hands.  Disinfect objects and surfaces that you frequently touch every day.  Stay away from people who are sick.  Avoid going out in public, follow guidance from your state and local health authorities.  Avoid crowded indoor spaces. Stay at least 6 ft (2 m) away from others.  If you must go out in public, wear a cloth face covering or face mask. Make sure your mask covers your nose and mouth.  Stay home if you are sick, except to get medical care. Call your health care provider before you get medical care. Your health care provider will tell you how long to stay home.  Make sure your vaccines are up to date. Ask your health care provider what vaccines you need. What should I do if I need to travel? Follow travel recommendations from your local health authority, the CDC, and WHO. Travel information and advice  Centers for Disease Control and Prevention (CDC): GeminiCard.gl  World Health Organization [facility]): PreviewDomains.se Know the risks and take action to protect your health  You are at higher risk of getting coronavirus disease if you are traveling to areas with an outbreak or if you are exposed to travelers from areas with an outbreak.  Wash your hands often and practice good hygiene to lower the  risk of catching or spreading the virus. What should I do if I am sick? General instructions to stop the spread of infection  Wash your hands often with soap and water for at least 20 seconds. If soap and water are not available, use alcohol-based hand sanitizer.  Cough or sneeze into a tissue, sleeve, or elbow. Do not cough or sneeze into your hand or the air.  If you cough or sneeze into a tissue, throw it away immediately and wash your hands.  Stay home unless you must get medical care. Call your health care provider or local health authority before you get medical care.  Avoid public areas. Do not take public transportation, if possible.  If you can, wear a mask if you must go out of the house or if you are in close contact with someone who is not sick. Make sure your mask covers your nose and mouth. Keep your home clean  Disinfect objects and surfaces that are frequently touched every day. This may include: ? Counters and tables. ? Doorknobs and light switches. ? Sinks and faucets. ? Electronics such as phones, remote controls, keyboards, computers, and tablets.  Wash dishes in hot, soapy water or use a dishwasher. Air-dry your dishes.  Wash laundry in hot water. Prevent infecting other household members  Let healthy household members care for children and pets, if possible. If you have to care for children or pets, wash your hands often and wear a mask.  Sleep in a different bedroom or bed, if possible.  Do not share personal items, such as razors, toothbrushes, deodorant, combs, brushes, towels, and washcloths. Where to find more information Centers for Disease Control and Prevention (CDC)  Information and news updates: CardRetirement.cz World Health Organization [facility])  Information and news updates: AffordableSalon.es  Coronavirus health topic: https://thompson-craig.com/  Questions and answers on  COVID-19: kruiseway.com  Global tracker: who.sprinklr.com American Academy of Pediatrics (AAP)  Information for families: www.healthychildren.org/English/health-issues/conditions/chest-lungs/Pages/2019-Novel-Coronavirus.aspx The coronavirus situation is changing rapidly. Check your local health authority website or the CDC and [facility] websites for updates and news. When should I contact a health care provider?  Contact your health care provider if you have symptoms of an infection, such as fever or cough, and you: ? Have been near anyone who is known to have coronavirus disease. ? Have come into contact with a person who is suspected to have coronavirus disease. ? Have traveled to an area where there is an outbreak of COVID-19. When should I get emergency medical care?  Get help right away by calling your local emergency services (911 in the [place] if you have: ? Trouble breathing. ? Pain or pressure in your chest. ? Confusion. ? Blue-tinged lips and fingernails. ? Difficulty waking from sleep. ? Symptoms that get worse. Let the emergency medical personnel know if you think you have coronavirus disease.  Summary  A new respiratory virus is spreading from person to person and causing COVID-19 (coronavirus disease).  The virus that causes COVID-19 appears to spread easily. It spreads from one person to another through droplets from breathing, speaking, singing, coughing, or sneezing.  Older adults and those with chronic diseases are at higher risk of disease. If you are at higher risk for complications, take extra precautions.  There is currently no vaccine to prevent coronavirus disease. There are no medicines, such as antibiotics or antivirals, to treat the virus.  You can protect yourself and your family by washing your hands often, avoiding touching your face, and covering your coughs and sneezes. This information is not intended to replace advice  given to you by your health care provider. Make sure you discuss any questions you have with your health care provider. Document Revised: 08/08/2019 Document Reviewed: 02/04/2019 Elsevier Patient Education  2020 Elsevier Inc.  COVID-19: How to Protect Yourself and Others Know how it spreads  There is currently no vaccine to prevent coronavirus disease 2019 (COVID-19).  The best way to prevent illness is to avoid being exposed to this virus.  The virus is thought to spread mainly from person-to-person. ? Between people who are in close contact with one another (within about 6 feet). ? Through respiratory droplets produced when an infected person coughs, sneezes or talks. ? These droplets can land in the mouths or noses of people who are nearby or possibly be inhaled into the lungs. ? COVID-19 may be spread by people who are not showing symptoms. Everyone should Clean your hands often  Wash your hands often with soap and water for at least 20 seconds especially after you have been in a public place, or after blowing your nose, coughing, or sneezing.  If soap and water are not readily available, use a hand sanitizer that contains at least 60% alcohol. Cover all surfaces of your hands and rub them together until they feel dry.  Avoid touching your eyes, nose, and mouth with unwashed hands. Avoid close contact  Limit contact with others as much as possible.  Avoid close contact with people who are sick.  Put distance between yourself and other people. ? Remember that some people without symptoms may be able to spread virus. ? This is especially important for people who are at higher risk of getting very RetroStamps.it Cover your mouth and nose with a mask when around others  You could spread COVID-19 to others even if you do not feel sick.  Everyone should wear a mask in public settings and when around people  not living in their household, especially when social distancing is difficult to maintain. ? Masks should not be placed on young children under age 87, anyone who has trouble breathing, or is unconscious, incapacitated or otherwise unable to remove the mask without assistance.  The mask is meant to protect other people in case you are infected.  Do NOT use a facemask meant for a Research scientist (physical sciences).  Continue to keep about 6 feet between yourself and others. The mask is not a substitute for social distancing. Cover coughs and sneezes  Always cover your mouth and nose with a tissue when you cough or sneeze or use the inside of your elbow.  Throw used tissues in the trash.  Immediately wash your hands with soap and water for at least 20 seconds. If soap and water are not readily available, clean your hands with a hand sanitizer that contains at least  60% alcohol. Clean and disinfect  Clean AND disinfect frequently touched surfaces daily. This includes tables, doorknobs, light switches, countertops, handles, desks, phones, keyboards, toilets, faucets, and sinks. RackRewards.fr  If surfaces are dirty, clean them: Use detergent or soap and water prior to disinfection.  Then, use a household disinfectant. You can see a list of EPA-registered household disinfectants here. michellinders.com 06/25/2019 This information is not intended to replace advice given to you by your health care provider. Make sure you discuss any questions you have with your health care provider. Document Revised: 07/03/2019 Document Reviewed: 05/01/2019 Elsevier Patient Education  2020 [place].   COVID-19 COVID-19 is a respiratory infection that is caused by a virus called severe acute respiratory syndrome coronavirus 2 (SARS-CoV-2). The disease is also known as coronavirus disease or novel coronavirus. In some people, the virus may not cause any  symptoms. In others, it may cause a serious infection. The infection can get worse quickly and can lead to complications, such as:  Pneumonia, or infection of the lungs.  Acute respiratory distress syndrome or ARDS. This is a condition in which fluid build-up in the lungs prevents the lungs from filling with air and passing oxygen into the blood.  Acute respiratory failure. This is a condition in which there is not enough oxygen passing from the lungs to the body or when carbon dioxide is not passing from the lungs out of the body.  Sepsis or septic shock. This is a serious bodily reaction to an infection.  Blood clotting problems.  Secondary infections due to bacteria or fungus.  Organ failure. This is when your body's organs stop working. The virus that causes COVID-19 is contagious. This means that it can spread from person to person through droplets from coughs and sneezes (respiratory secretions). What are the causes? This illness is caused by a virus. You may catch the virus by:  Breathing in droplets from an infected person. Droplets can be spread by a person breathing, speaking, singing, coughing, or sneezing.  Touching something, like a table or a doorknob, that was exposed to the virus (contaminated) and then touching your mouth, nose, or eyes. What increases the risk? Risk for infection You are more likely to be infected with this virus if you:  Are within 6 feet (2 meters) of a person with COVID-19.  Provide care for or live with a person who is infected with COVID-19.  Spend time in crowded indoor spaces or live in shared housing. Risk for serious illness You are more likely to become seriously ill from the virus if you:  Are 54 years of age or older. The higher your age, the more you are at risk for serious illness.  Live in a nursing home or long-term care facility.  Have cancer.  Have a long-term (chronic) disease such as: ? Chronic lung disease, including  chronic obstructive pulmonary disease or asthma. ? A long-term disease that lowers your body's ability to fight infection (immunocompromised). ? Heart disease, including heart failure, a condition in which the arteries that lead to the heart become narrow or blocked (coronary artery disease), a disease which makes the heart muscle thick, weak, or stiff (cardiomyopathy). ? Diabetes. ? Chronic kidney disease. ? Sickle cell disease, a condition in which red blood cells have an abnormal "sickle" shape. ? Liver disease.  Are obese. What are the signs or symptoms? Symptoms of this condition can range from mild to severe. Symptoms may appear any time from 2 to 14 days after  being exposed to the virus. They include:  A fever or chills.  A cough.  Difficulty breathing.  Headaches, body aches, or muscle aches.  Runny or stuffy (congested) nose.  A sore throat.  New loss of taste or smell. Some people may also have stomach problems, such as nausea, vomiting, or diarrhea. Other people may not have any symptoms of COVID-19. How is this diagnosed? This condition may be diagnosed based on:  Your signs and symptoms, especially if: ? You live in an area with a COVID-19 outbreak. ? You recently traveled to or from an area where the virus is common. ? You provide care for or live with a person who was diagnosed with COVID-19. ? You were exposed to a person who was diagnosed with COVID-19.  A physical exam.  Lab tests, which may include: ? Taking a sample of fluid from the back of your nose and throat (nasopharyngeal fluid), your nose, or your throat using a swab. ? A sample of mucus from your lungs (sputum). ? Blood tests.  Imaging tests, which may include, X-rays, CT scan, or ultrasound. How is this treated? At present, there is no medicine to treat COVID-19. Medicines that treat other diseases are being used on a trial basis to see if they are effective against COVID-19. Your health care  provider will talk with you about ways to treat your symptoms. For most people, the infection is mild and can be managed at home with rest, fluids, and over-the-counter medicines. Treatment for a serious infection usually takes places in a hospital intensive care unit (ICU). It may include one or more of the following treatments. These treatments are given until your symptoms improve.  Receiving fluids and medicines through an IV.  Supplemental oxygen. Extra oxygen is given through a tube in the nose, a face mask, or a hood.  Positioning you to lie on your stomach (prone position). This makes it easier for oxygen to get into the lungs.  Continuous positive airway pressure (CPAP) or bi-level positive airway pressure (BPAP) machine. This treatment uses mild air pressure to keep the airways open. A tube that is connected to a motor delivers oxygen to the body.  Ventilator. This treatment moves air into and out of the lungs by using a tube that is placed in your windpipe.  Tracheostomy. This is a procedure to create a hole in the neck so that a breathing tube can be inserted.  Extracorporeal membrane oxygenation (ECMO). This procedure gives the lungs a chance to recover by taking over the functions of the heart and lungs. It supplies oxygen to the body and removes carbon dioxide. Follow these instructions at home: Lifestyle  If you are sick, stay home except to get medical care. Your health care provider will tell you how long to stay home. Call your health care provider before you go for medical care.  Rest at home as told by your health care provider.  Do not use any products that contain nicotine or tobacco, such as cigarettes, e-cigarettes, and chewing tobacco. If you need help quitting, ask your health care provider.  Return to your normal activities as told by your health care provider. Ask your health care provider what activities are safe for you. General instructions  Take  over-the-counter and prescription medicines only as told by your health care provider.  Drink enough fluid to keep your urine pale yellow.  Keep all follow-up visits as told by your health care provider. This is important. How is  this prevented?  There is no vaccine to help prevent COVID-19 infection. However, there are steps you can take to protect yourself and others from this virus. To protect yourself:   Do not travel to areas where COVID-19 is a risk. The areas where COVID-19 is reported change often. To identify high-risk areas and travel restrictions, check the CDC travel website: StageSync.si  If you live in, or must travel to, an area where COVID-19 is a risk, take precautions to avoid infection. ? Stay away from people who are sick. ? Wash your hands often with soap and water for 20 seconds. If soap and water are not available, use an alcohol-based hand sanitizer. ? Avoid touching your mouth, face, eyes, or nose. ? Avoid going out in public, follow guidance from your state and local health authorities. ? If you must go out in public, wear a cloth face covering or face mask. Make sure your mask covers your nose and mouth. ? Avoid crowded indoor spaces. Stay at least 6 feet (2 meters) away from others. ? Disinfect objects and surfaces that are frequently touched every day. This may include:  Counters and tables.  Doorknobs and light switches.  Sinks and faucets.  Electronics, such as phones, remote controls, keyboards, computers, and tablets. To protect others: If you have symptoms of COVID-19, take steps to prevent the virus from spreading to others.  If you think you have a COVID-19 infection, contact your health care provider right away. Tell your health care team that you think you may have a COVID-19 infection.  Stay home. Leave your house only to seek medical care. Do not use public transport.  Do not travel while you are sick.  Wash your hands often  with soap and water for 20 seconds. If soap and water are not available, use alcohol-based hand sanitizer.  Stay away from other members of your household. Let healthy household members care for children and pets, if possible. If you have to care for children or pets, wash your hands often and wear a mask. If possible, stay in your own room, separate from others. Use a different bathroom.  Make sure that all people in your household wash their hands well and often.  Cough or sneeze into a tissue or your sleeve or elbow. Do not cough or sneeze into your hand or into the air.  Wear a cloth face covering or face mask. Make sure your mask covers your nose and mouth. Where to find more information  Centers for Disease Control and Prevention: StickerEmporium.tn  World Health Organization: https://thompson-craig.com/ Contact a health care provider if:  You live in or have traveled to an area where COVID-19 is a risk and you have symptoms of the infection.  You have had contact with someone who has COVID-19 and you have symptoms of the infection. Get help right away if:  You have trouble breathing.  You have pain or pressure in your chest.  You have confusion.  You have bluish lips and fingernails.  You have difficulty waking from sleep.  You have symptoms that get worse. These symptoms may represent a serious problem that is an emergency. Do not wait to see if the symptoms will go away. Get medical help right away. Call your local emergency services (911 in the [place] Do not drive yourself to the hospital. Let the emergency medical personnel know if you think you have COVID-19. Summary  COVID-19 is a respiratory infection that is caused by a virus. It is  also known as coronavirus disease or novel coronavirus. It can cause serious infections, such as pneumonia, acute respiratory distress syndrome, acute respiratory failure, or sepsis.  The virus that  causes COVID-19 is contagious. This means that it can spread from person to person through droplets from breathing, speaking, singing, coughing, or sneezing.  You are more likely to develop a serious illness if you are 23 years of age or older, have a weak immune system, live in a nursing home, or have chronic disease.  There is no medicine to treat COVID-19. Your health care provider will talk with you about ways to treat your symptoms.  Take steps to protect yourself and others from infection. Wash your hands often and disinfect objects and surfaces that are frequently touched every day. Stay away from people who are sick and wear a mask if you are sick. This information is not intended to replace advice given to you by your health care provider. Make sure you discuss any questions you have with your health care provider. Document Revised: 08/08/2019 Document Reviewed: 11/14/2018 Elsevier Patient Education  2020 [employer].

## 2020-10-24 NOTE — TOC Transition Note (Signed)
Transition of Care [facility]) - CM/SW Discharge Note   Patient Details  Name: Alicia Vang MRN: [id number] Date of Birth: 07/02/1927  Transition of Care [facility]) CM/SW Contact:  Lawerance Sabal, RN Phone Number: 10/24/2020, 1:43 PM   Clinical Narrative:    Sherron Monday w patient who deferred to daughter for [facility] set up. Spoke w her daughter who agreed to [facility] PT OT services. Discussed HH providers, availability, and ratings. Referral made to [facility] and accepted. They would like 3/1, referral placed to [employer] who will bring it the unit to be sent home w the patient.  [name] states that she will be able to provide transportation home for the patient.   Hagins,[name] Daughter [phone]  [phone]       Final next level of care: Home w Home Health Services Barriers to Discharge: No Barriers Identified   Patient Goals and CMS Choice Patient states their goals for this hospitalization and ongoing recovery are:: to go home CMS Medicare.gov Compare Post Acute Care list provided to:: Other (Comment Required) Choice offered to / list presented to : Adult Children  Discharge Placement                       Discharge Plan and Services                DME Arranged: 3-N-1 DME Agency: [facility] Date DME Agency Contacted: 10/24/20 Time DME Agency Contacted: [phone] Representative spoke with at DME Agency: Arnold Long HH Arranged: PT,OT HH Agency: [facility] Date [facility] Agency Contacted: 10/24/20 Time HH Agency Contacted: 1343 Representative spoke with at [facility] Agency: Kandee Keen  Social Determinants of Health (SDOH) Interventions     Readmission Risk Interventions No flowsheet data found.

## 2020-10-24 NOTE — Discharge Summary (Signed)
DISCHARGE SUMMARY  Alicia Vang  MR#: [id number]  DOB:1927/08/20  Date of Admission: 10/20/2020 Date of Discharge: 10/24/2020  Attending Physician:Dorthy Magnussen Hennie Duos, MD  Patient's EPP:IRJJOA, Provider Not In  Consults: none  Disposition: D/C home to care of family   Date of Positive COVID Test: 12/29  Date Quarantine Ends: 1/12  COVID-19 specific Treatment: Remdesivir 12/29 > 1/2  Follow-up Appts:  Follow-up Information    Your Primary Care Provider Follow up.   Why: See your Primary Care Provider for a follow up visit in 7-10 days.               Discharge Diagnoses: Acute Covid infection  Acute metabolic encephalopathy -acute delirium Sinus bradycardia Acute kidney injury Normocytic anemia Dehydration Hypokalemia Hypomagnesemia HTN DM 2  Initial presentation: 87 year old with a history of HTN and DM2 who presented to the ED with lethargy and confusion.  Patient's daughter had noticed a cough for a few days and worsening fatigue.  At baseline she is ambulatory and fully independent.  On the date of her admission she began to behave oddly and had clearly become significantly confused.  In the ED she is found to have a temperature of 101.2.  Covid testing was positive but she was not hypoxic.  There was no acute infiltrate on CXR.  Head CT was without acute findings and brain MRI was also nonacute.  Hospital Course:  Acute Covid infection  The patient completed 4 full days of Remdesivir dosing  -no difficulty with hypoxia -clinically stable throughout her hospital stay -Remdesivir discontinued at time of discharge and no indication for steroids or oxygen therapy  Acute metabolic encephalopathy -acute delirium No acute findings on CT or MRI head -ammonia normal -no hypoglycemia -no evidence of UTI -felt to be a consequence of dehydration, and Covid itself -on the day of her discharge the patient was alert oriented and conversant -she appeared to have  returned to her baseline and was quite anxious to go home  Sinus bradycardia Heart rate 40-50 during ED visit -metoprolol transiently placed on hold resulting in reflex tachycardia and increasing atrial and ventricular arrhythmias therefore beta-blocker resumed and patient tolerating well -heart rate stable at time of discharge  Acute kidney injury Resolved with volume expansion with creatinine normal at time of discharge  Normocytic anemia Anemia panel most consistent with anemia of chronic disease, likely poor nutrition in setting of advanced age  Dehydration Due to Covid/poor intake -corrected with IV fluid support -appetite improved at time of discharge -stressed importance of adequate intake to patient  Hypokalemia Corrected with supplementation  Hypomagnesemia Corrected with supplementation  HTN Blood pressure reasonably well controlled at time of discharge  DM 2 CBG reasonably controlled -continue to monitor -A1c 7.2  Allergies as of 10/24/2020      Reactions   Amlodipine Swelling   Swelling in feet      Medication List    STOP taking these medications   chlorthalidone 25 MG tablet Commonly known as: HYGROTON   Potassium Chloride ER 20 MEQ Tbcr     TAKE these medications   acetaminophen 325 MG tablet Commonly known as: TYLENOL Take 2 tablets (650 mg total) by mouth every 6 (six) hours as needed for mild pain or headache (fever >/= 101).   glimepiride 2 MG tablet Commonly known as: AMARYL Take 4 mg by mouth daily with breakfast.   latanoprost 0.005 % ophthalmic solution Commonly known as: XALATAN Place 1 drop into both eyes at bedtime.  losartan 100 MG tablet Commonly known as: COZAAR Take 100 mg by mouth daily.   Metoprolol Succinate 50 MG Cs24 Take 50 mg by mouth daily.   Refresh 1.4-0.6 % Soln Generic drug: Polyvinyl Alcohol-Povidone PF Place 1 drop into both eyes as directed. For dry eyes   Xiidra 5 % Soln Generic drug:  Lifitegrast Place 1 drop into both eyes in the morning and at bedtime.            Durable Medical Equipment  (From admission, onward)         Start     Ordered   10/24/20 0927  For home use only DME 3 n 1  Once        10/24/20 0926          Day of Discharge BP (!) 150/85 (BP Location: Right Arm)   Pulse (!) 53   Temp 98.3 F (36.8 C) (Oral)   Resp 18   Ht 5\' 5"  (1.651 m)   Wt 44.5 kg   SpO2 100%   BMI 16.33 kg/m   Physical Exam: General: No acute respiratory distress Lungs: Clear to auscultation bilaterally without wheezes or crackles Cardiovascular: Regular rate and rhythm without murmur gallop or rub normal S1 and S2 Abdomen: Nontender, nondistended, soft, bowel sounds positive, no rebound, no ascites, no appreciable mass Extremities: No significant cyanosis, clubbing, or edema bilateral lower extremities  Basic Metabolic Panel: Recent Labs  Lab 10/20/20 1638 10/21/20 0503 10/22/20 0411 10/23/20 0140 10/24/20 0344  NA 137 140 137 138 138  K 3.0* 2.9* 3.0* 4.0 4.9  CL 99  --  103 105 108  CO2 25  --  26 25 23   GLUCOSE 174*  --  149* 163* 132*  BUN 23  --  14 13 18   CREATININE 1.36*  --  1.10* 1.03* 0.94  CALCIUM 9.6  --  8.3* 8.1* 8.3*  MG  --   --  1.6* 2.3  --     Liver Function Tests: Recent Labs  Lab 10/20/20 1638 10/22/20 0411 10/23/20 0140 10/24/20 0344  AST 24 37 34 33  ALT 16 18 16 16   ALKPHOS 42 34* 36* 33*  BILITOT 0.4 0.5 0.3 0.4  PROT 7.2 6.2* 5.8* 5.6*  ALBUMIN 3.9 3.2* 2.8* 2.7*    Recent Labs  Lab 10/20/20 2127  AMMONIA 31    Coags: Recent Labs  Lab 10/20/20 1638  INR 1.1    CBC: Recent Labs  Lab 10/20/20 1638 10/21/20 0503 10/22/20 0411 10/23/20 0140 10/24/20 0344  WBC 3.6*  --  4.1 3.6* 3.6*  NEUTROABS 2.7  --   --   --   --   HGB 11.4* 11.2* 11.7* 10.7* 10.4*  HCT 36.4 33.0* 34.4* 31.6* 33.1*  MCV 91.5  --  88.0 88.0 90.9  PLT 161  --  140* 146* 132*    CBG: Recent Labs  Lab 10/23/20 1211  10/23/20 1737 10/23/20 2156 10/24/20 0711 10/24/20 1136  GLUCAP 174* 131* 137* 134* 153*    Recent Results (from the past 240 hour(s))  Culture, blood (Routine x 2)     Status: None (Preliminary result)   Collection Time: 10/20/20  5:00 PM   Specimen: BLOOD RIGHT ARM  Result Value Ref Range Status   Specimen Description BLOOD RIGHT ARM  Final   Special Requests   Final    BOTTLES DRAWN AEROBIC AND ANAEROBIC Blood Culture results may not be optimal due to an inadequate volume of blood  received in culture bottles   Culture   Final    NO GROWTH 4 DAYS Performed at [facility], [address], [place], [place] [address]    Report Status PENDING  Incomplete  Culture, blood (Routine x 2)     Status: None (Preliminary result)   Collection Time: 10/20/20  5:00 PM   Specimen: BLOOD LEFT ARM  Result Value Ref Range Status   Specimen Description BLOOD LEFT ARM  Final   Special Requests   Final    BOTTLES DRAWN AEROBIC AND ANAEROBIC Blood Culture results may not be optimal due to an inadequate volume of blood received in culture bottles   Culture   Final    NO GROWTH 4 DAYS Performed at [facility], [address] [place], [place] [address]    Report Status PENDING  Incomplete  Urine culture     Status: None   Collection Time: 10/20/20 10:55 PM   Specimen: Urine, Random  Result Value Ref Range Status   Specimen Description URINE, RANDOM  Final   Special Requests NONE  Final   Culture   Final    NO GROWTH Performed at [facility], [address] [place], [place] [address]    Report Status 10/22/2020 FINAL  Final      Time spent in discharge (includes decision making & examination of pt): 35 minutes  10/24/2020, 12:13 PM   Lonia Blood, MD [facility] Office  [phone]

## 2020-10-24 NOTE — Progress Notes (Signed)
Occupational Therapy Treatment Patient Details Name: Alicia Vang MRN: [id number] DOB: 05-29-1927 Today's Date: 10/24/2020    History of present illness 87 year old with a history of HTN and DM2 who presented to the ED with lethargy and confusion. At baseline she is ambulatory and fully independent. Pt found to be COVID+.   OT comments  Pt progressing towards OT goals. Cognition has improved but is not back to independent living level (no family present to determine baseline). Pt was mod A for SPT to [facility] and min A for peri care. Pt very very cold, warm blankets provided and Pt required increased assist for eating today requiring pre-loaded spoon. 3 in 1 arrived for Pt to take home and OT placed in room. OT will continue to follow acutely and POC remains appropriate at this time for Pt to return to PLOF.    Follow Up Recommendations  Home health OT    Equipment Recommendations  3 in 1 bedside commode    Recommendations for Other Services      Precautions / Restrictions Precautions Precautions: Fall Precaution Comments: monitor HR, arrythmias Restrictions Weight Bearing Restrictions: No       Mobility Bed Mobility               General bed mobility comments: Pt in recliner at beginning and end of session  Transfers Overall transfer level: Needs assistance Equipment used: 1 person hand held assist Transfers: Stand Pivot Transfers   Stand pivot transfers: Mod assist       General transfer comment: needs multiple verbal cues    Balance Overall balance assessment: Needs assistance Sitting-balance support: No upper extremity supported;Feet supported Sitting balance-[name] Scale: Fair     Standing balance support: Bilateral upper extremity supported;During functional activity Standing balance-[name] Scale: Poor Standing balance comment: reliant on UE support of RW                           ADL either performed or assessed with clinical judgement    ADL Overall ADL's : Needs assistance/impaired Eating/Feeding: Moderate assistance;Sitting Eating/Feeding Details (indicate cue type and reason): pre-loaded spoon for eating Grooming: Wash/dry hands;Set up;Wash/dry face;Sitting Grooming Details (indicate cue type and reason): in chair, mod cues to sequence                 Toilet Transfer: Moderate assistance;Stand-pivot Toilet Transfer Details (indicate cue type and reason): face to face transfer Toileting- Clothing Manipulation and Hygiene: Moderate assistance;Sit to/from stand Toileting - Clothing Manipulation Details (indicate cue type and reason): standing for peri care     Functional mobility during ADLs: Minimal assistance       Vision       Perception     Praxis      Cognition Arousal/Alertness: Awake/alert Behavior During Therapy: WFL for tasks assessed/performed Overall Cognitive Status: No family/caregiver present to determine baseline cognitive functioning                                 General Comments: more conversant today, still some orientation confusion        Exercises     Shoulder Instructions       General Comments VSS throughout session    Pertinent Vitals/ Pain       Pain Assessment: No/denies pain  Home Living  Prior Functioning/Environment              Frequency  Min 2X/week        Progress Toward Goals  OT Goals(current goals can now be found in the care plan section)  Progress towards OT goals: Progressing toward goals  Acute Rehab OT Goals Patient Stated Goal: get home with her daughter OT Goal Formulation: With patient Time For Goal Achievement: 11/06/20 Potential to Achieve Goals: Fair  Plan Discharge plan remains appropriate;Frequency remains appropriate    Co-evaluation                 AM-PAC OT "6 Clicks" Daily Activity     Outcome Measure   Help from another person  eating meals?: A Little Help from another person taking care of personal grooming?: A Little Help from another person toileting, which includes using toliet, bedpan, or urinal?: A Lot Help from another person bathing (including washing, rinsing, drying)?: A Little Help from another person to put on and taking off regular upper body clothing?: A Little Help from another person to put on and taking off regular lower body clothing?: A Lot 6 Click Score: 16    End of Session Equipment Utilized During Treatment: Gait belt  OT Visit Diagnosis: Unsteadiness on feet (R26.81);Muscle weakness (generalized) (M62.81)   Activity Tolerance Patient tolerated treatment well   Patient Left in chair;with call bell/phone within reach;with chair alarm set   Nurse Communication Mobility status;Precautions;Other (comment) (no purewick)        Time: 7902-4097 OT Time Calculation (min): 42 min  Charges: OT General Charges $OT Visit: 1 Visit OT Treatments $Self Care/Home Management : 23-37 mins $Therapeutic Activity: 8-22 mins  Alicia Vang OTR/L Acute Rehabilitation Services Pager: [phone] Office: [phone]   Alicia Vang 10/24/2020, 2:04 PM

## 2020-10-24 NOTE — Plan of Care (Signed)
  Problem: Education: Goal: Knowledge of General Education information will improve Description: Including pain rating scale, medication(s)/side effects and non-pharmacologic comfort measures Outcome: Adequate for Discharge   

## 2020-10-25 LAB — CULTURE, BLOOD (ROUTINE X 2)
Culture: NO GROWTH
Culture: NO GROWTH

## 2021-03-24 ENCOUNTER — Emergency Department (HOSPITAL_COMMUNITY): Payer: Medicare PPO

## 2021-03-24 ENCOUNTER — Encounter (HOSPITAL_COMMUNITY): Payer: Self-pay | Admitting: Emergency Medicine

## 2021-03-24 ENCOUNTER — Emergency Department (HOSPITAL_COMMUNITY)
Admission: EM | Admit: 2021-03-24 | Discharge: 2021-03-24 | Disposition: A | Discharge status: Home / Self Care | Payer: Medicare PPO | Attending: Emergency Medicine | Admitting: Emergency Medicine

## 2021-03-24 DIAGNOSIS — I10 Essential (primary) hypertension: Secondary | ICD-10-CM | POA: Diagnosis not present

## 2021-03-24 DIAGNOSIS — E119 Type 2 diabetes mellitus without complications: Secondary | ICD-10-CM | POA: Insufficient documentation

## 2021-03-24 DIAGNOSIS — R319 Hematuria, unspecified: Secondary | ICD-10-CM | POA: Diagnosis not present

## 2021-03-24 DIAGNOSIS — Z79899 Other long term (current) drug therapy: Secondary | ICD-10-CM | POA: Insufficient documentation

## 2021-03-24 DIAGNOSIS — R42 Dizziness and giddiness: Secondary | ICD-10-CM | POA: Insufficient documentation

## 2021-03-24 DIAGNOSIS — Z7901 Long term (current) use of anticoagulants: Secondary | ICD-10-CM | POA: Insufficient documentation

## 2021-03-24 DIAGNOSIS — R030 Elevated blood-pressure reading, without diagnosis of hypertension: Secondary | ICD-10-CM | POA: Diagnosis present

## 2021-03-24 DIAGNOSIS — Z7984 Long term (current) use of oral hypoglycemic drugs: Secondary | ICD-10-CM | POA: Insufficient documentation

## 2021-03-24 DIAGNOSIS — Z96642 Presence of left artificial hip joint: Secondary | ICD-10-CM | POA: Insufficient documentation

## 2021-03-24 DIAGNOSIS — Z8616 Personal history of COVID-19: Secondary | ICD-10-CM | POA: Diagnosis not present

## 2021-03-24 DIAGNOSIS — N3001 Acute cystitis with hematuria: Secondary | ICD-10-CM

## 2021-03-24 LAB — CBC WITH DIFFERENTIAL/PLATELET
Abs Immature Granulocytes: 0.01 10*3/uL (ref 0.00–0.07)
Basophils Absolute: 0 10*3/uL (ref 0.0–0.1)
Basophils Relative: 1 %
Eosinophils Absolute: 0 10*3/uL (ref 0.0–0.5)
Eosinophils Relative: 1 %
HCT: 41.5 % (ref 36.0–46.0)
Hemoglobin: 12.9 g/dL (ref 12.0–15.0)
Immature Granulocytes: 0 %
Lymphocytes Relative: 18 %
Lymphs Abs: 0.8 10*3/uL (ref 0.7–4.0)
MCH: 28.3 pg (ref 26.0–34.0)
MCHC: 31.1 g/dL (ref 30.0–36.0)
MCV: 91 fL (ref 80.0–100.0)
Monocytes Absolute: 0.4 10*3/uL (ref 0.1–1.0)
Monocytes Relative: 9 %
Neutro Abs: 3.2 10*3/uL (ref 1.7–7.7)
Neutrophils Relative %: 71 %
Platelets: 172 10*3/uL (ref 150–400)
RBC: 4.56 MIL/uL (ref 3.87–5.11)
RDW: 13.6 % (ref 11.5–15.5)
WBC: 4.4 10*3/uL (ref 4.0–10.5)
nRBC: 0 % (ref 0.0–0.2)

## 2021-03-24 LAB — URINALYSIS, ROUTINE W REFLEX MICROSCOPIC
Bacteria, UA: NONE SEEN
Bilirubin Urine: NEGATIVE
Glucose, UA: 150 mg/dL — AB
Ketones, ur: NEGATIVE mg/dL
Leukocytes,Ua: NEGATIVE
Nitrite: NEGATIVE
Protein, ur: 30 mg/dL — AB
RBC / HPF: >50 RBC/hpf — ABNORMAL HIGH (ref 0–5)
Specific Gravity, Urine: 1.005 (ref 1.005–1.030)
pH: 9 — ABNORMAL HIGH (ref 5.0–8.0)

## 2021-03-24 LAB — BASIC METABOLIC PANEL
Anion gap: 11 (ref 5–15)
BUN: 12 mg/dL (ref 8–23)
CO2: 25 mmol/L (ref 22–32)
Calcium: 9.4 mg/dL (ref 8.9–10.3)
Chloride: 103 mmol/L (ref 98–111)
Creatinine, Ser: 0.96 mg/dL (ref 0.44–1.00)
GFR, Estimated: 56 mL/min — ABNORMAL LOW (ref >60)
Glucose, Bld: 227 mg/dL — ABNORMAL HIGH (ref 70–99)
Potassium: 3.5 mmol/L (ref 3.5–5.1)
Sodium: 139 mmol/L (ref 135–145)

## 2021-03-24 LAB — TROPONIN I (HIGH SENSITIVITY)
Troponin I (High Sensitivity): 11 ng/L (ref <18)
Troponin I (High Sensitivity): 31 ng/L — ABNORMAL HIGH (ref <18)

## 2021-03-24 IMAGING — CT CT HEAD W/O CM
4 series · 16 of 47 positions shown, 18 images · non-contrast
Comparison: [DATE]

CLINICAL DATA: Dizziness, hypertension

EXAM:
CT HEAD WITHOUT CONTRAST
TECHNIQUE: Contiguous axial images were obtained from the base of the skull
through the vertex without intravenous contrast.

[Series 3: head without · axial · non-contrast · 0.44mm/px · z∈[+984,+1104]mm · 7 of 32 slices shown, 9 images]
[im 4/32  brain]
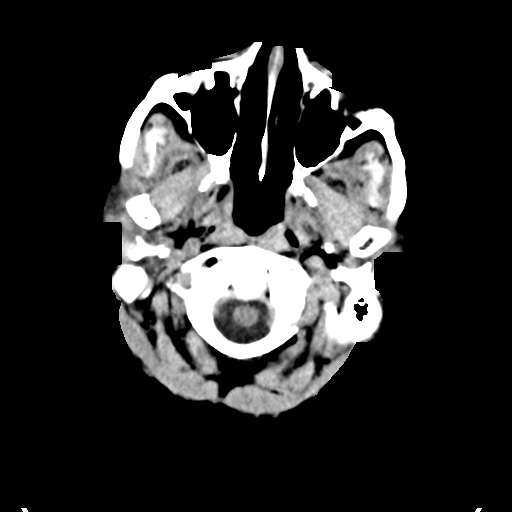
[im 4/32  bone]
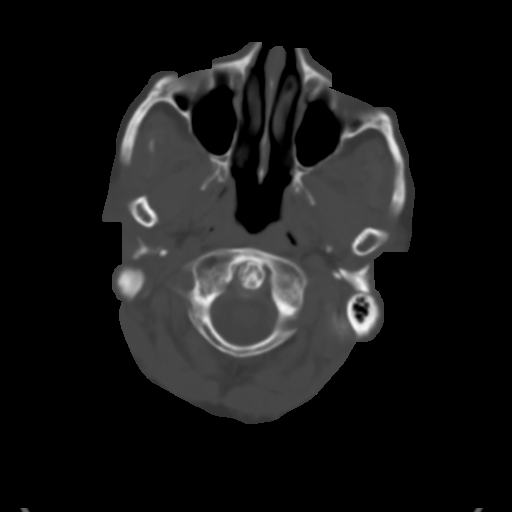
[im 8/32  brain]
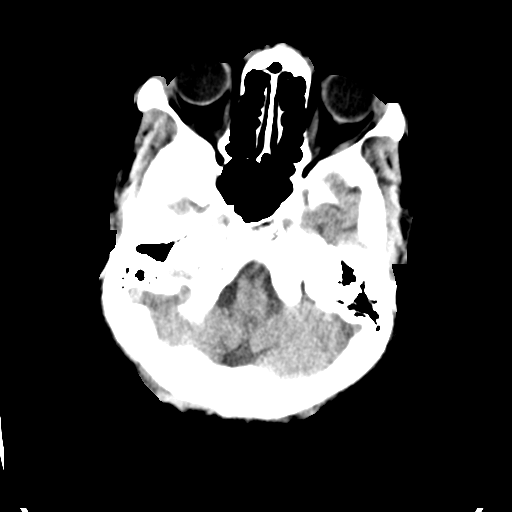
[im 12/32  brain]
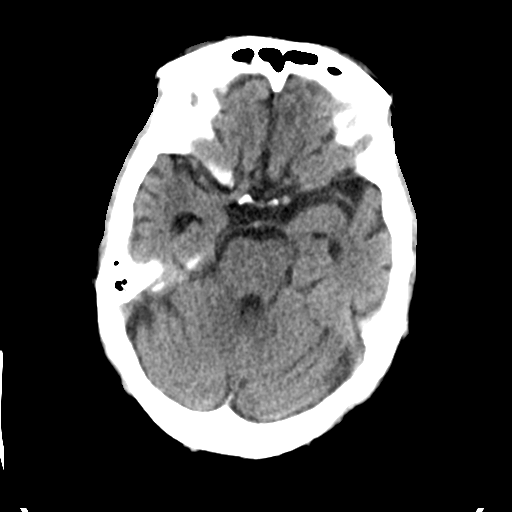
[im 16/32  brain]
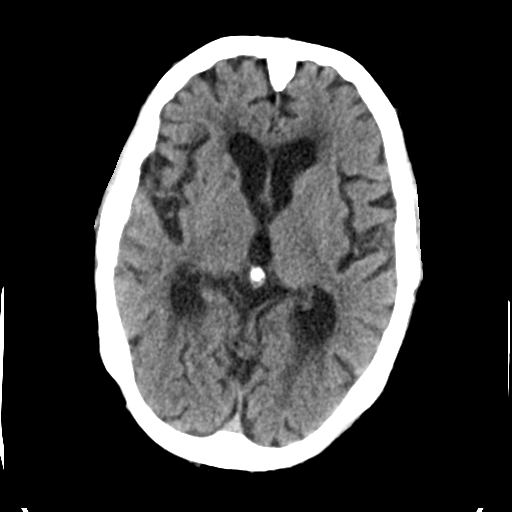
[im 20/32  brain]
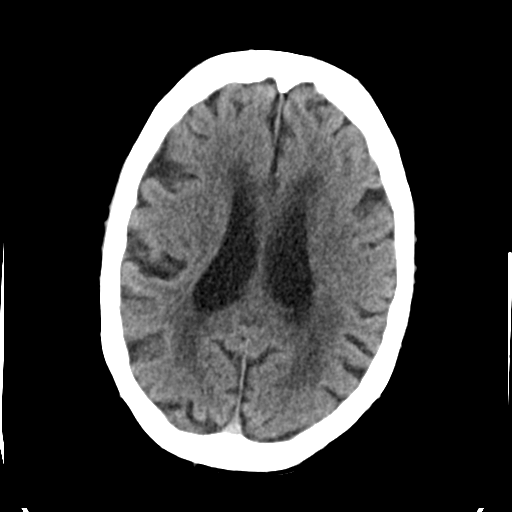
[im 20/32  bone]
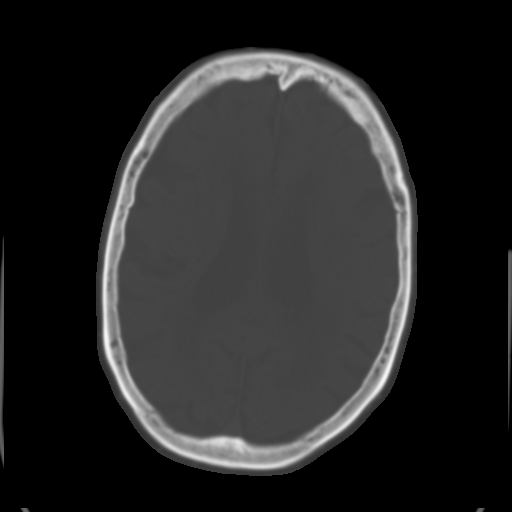
[im 24/32  brain]
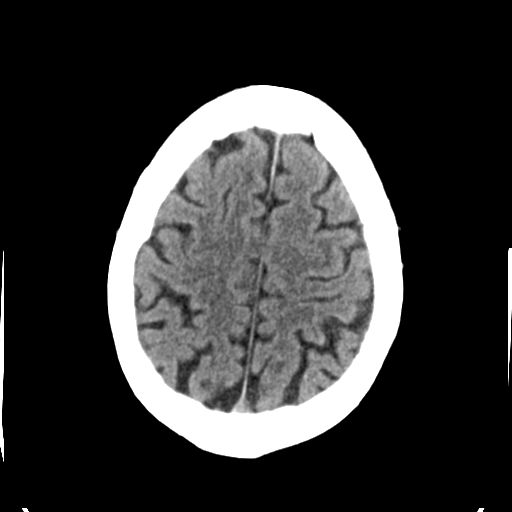
[im 28/32  brain]
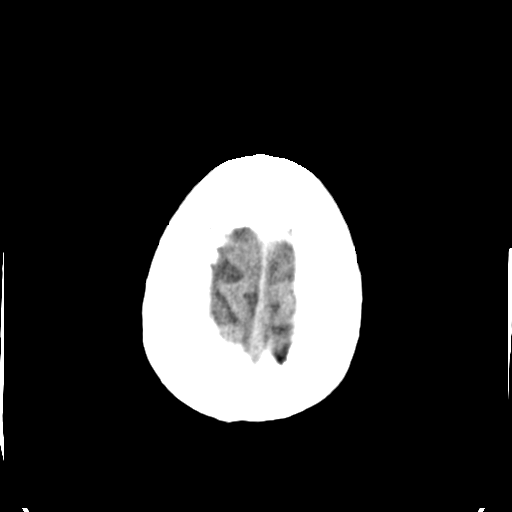

[Series 4: head bone · axial · 0.44mm/px · z∈[+983,+1015]mm · 3 of 79 slices shown]
[im 8/79  bone]
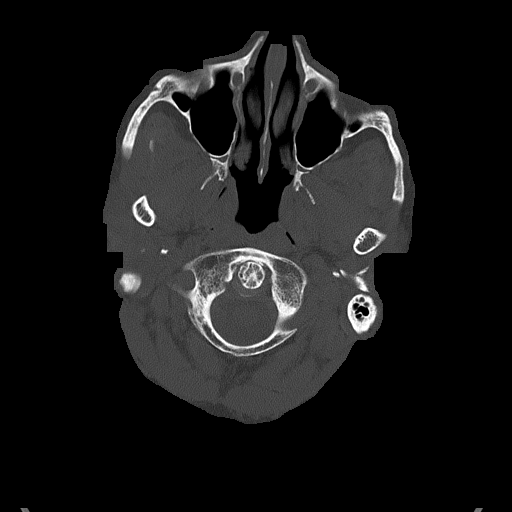
[im 16/79  bone]
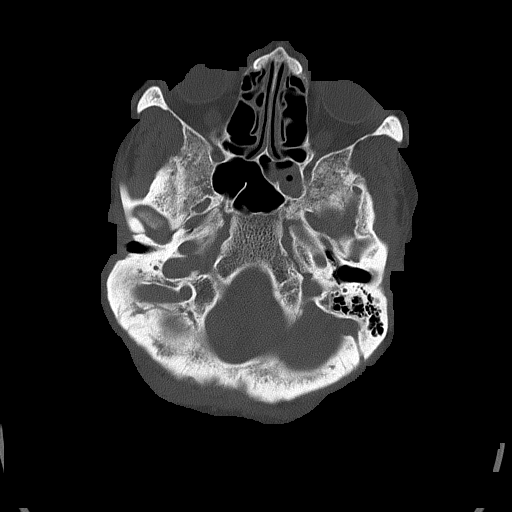
[im 24/79  bone]
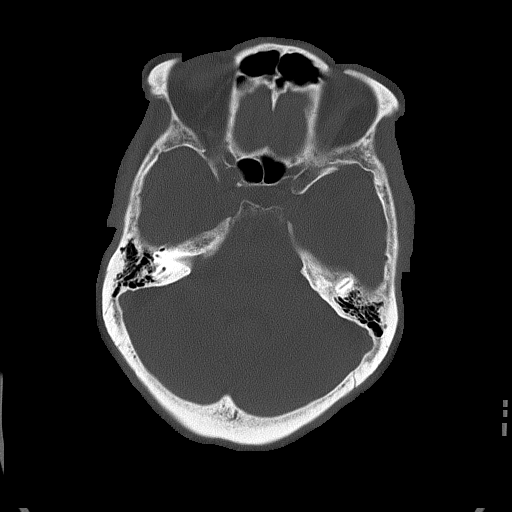

[Series 5: head without cor · coronal · non-contrast · 0.32mm/px · 3 of 68 slices shown]
[im 23/68  brain]
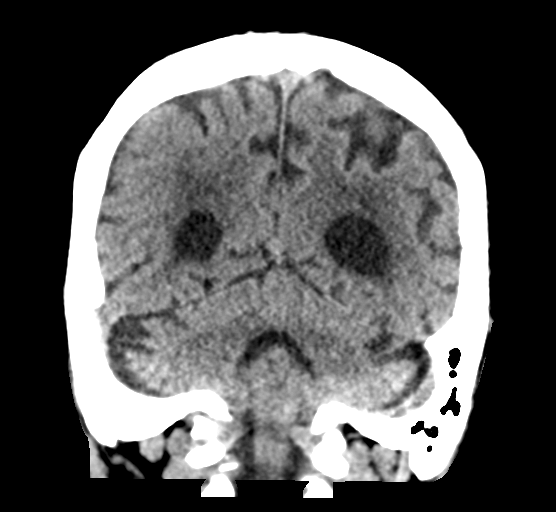
[im 30/68  brain]
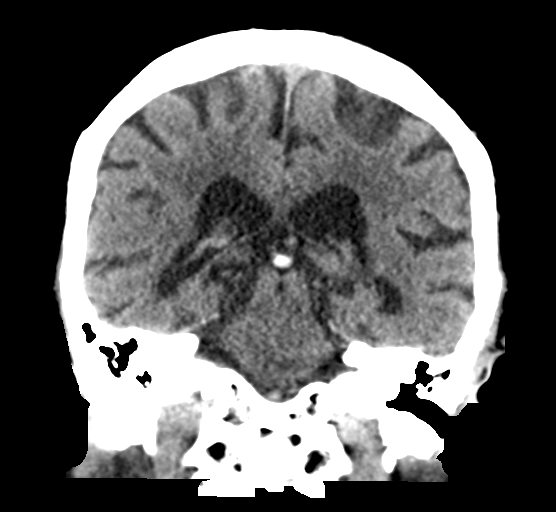
[im 38/68  brain]
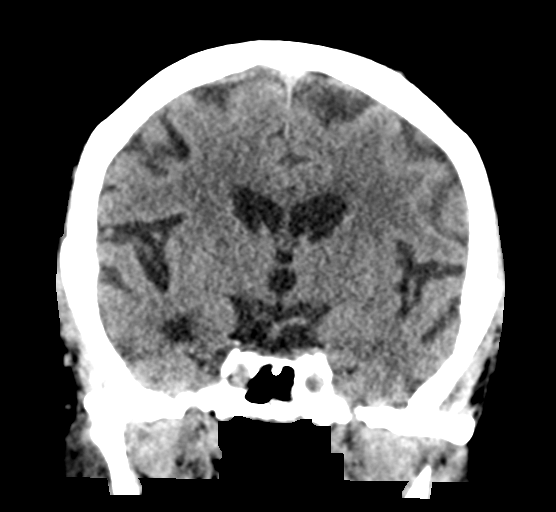

[Series 6: head without sag · sagittal · non-contrast · 0.32mm/px · 3 of 57 slices shown]
[im 19/57  brain]
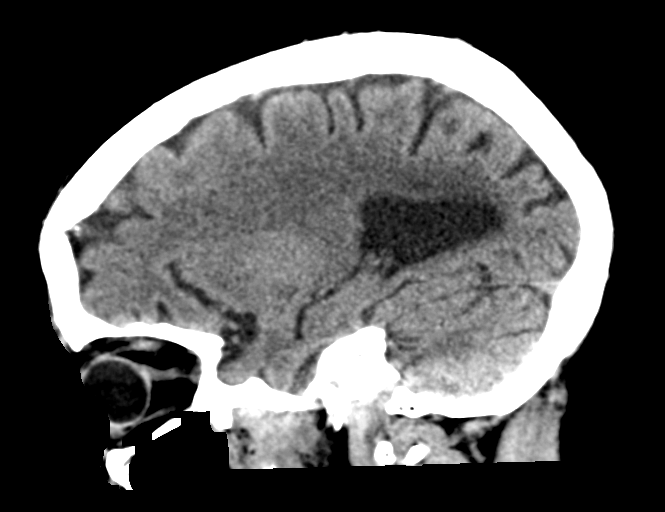
[im 29/57  brain]
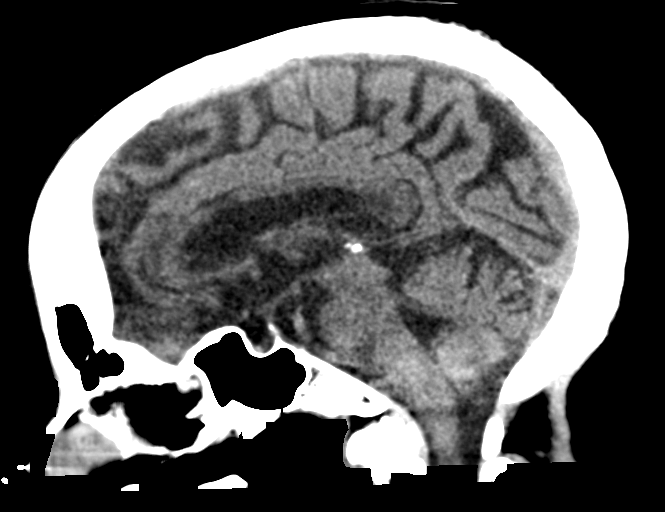
[im 38/57  brain]
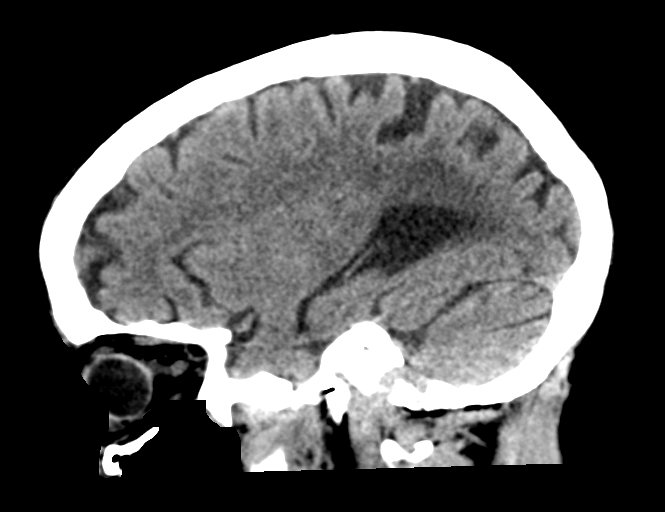

[16 of 47 positions shown; findings below may reference images not displayed]

FINDINGS: Brain: No evidence of acute infarction, hemorrhage, hydrocephalus,
extra-axial collection or mass lesion/mass effect. Mild low-density
changes within the periventricular and subcortical white matter
compatible with chronic microvascular ischemic change. Mild diffuse
cerebral volume loss.

Vascular: No hyperdense vessel or unexpected calcification.

Skull: Normal. Negative for fracture or focal lesion.

Sinuses/Orbits: Left sphenoid sinus mucosal thickening. Remaining
paranasal sinuses and mastoid air cells are clear. Orbital
structures unremarkable.

Other: None.
IMPRESSION: 1. No acute intracranial findings.
2. Chronic microvascular ischemic change and cerebral volume loss.
3. Left sphenoid sinus disease.

## 2021-03-24 IMAGING — CT CT RENAL STONE PROTOCOL
2 of 4 series · 15 of 46 positions shown, 17 images · non-contrast
Comparison: None

CLINICAL DATA: Hematuria of unknown cause in a [AGE] female.

EXAM:
CT ABDOMEN AND PELVIS WITHOUT CONTRAST
TECHNIQUE: Multidetector CT imaging of the abdomen and pelvis was performed
following the standard protocol without IV contrast.

[Series 3: renal stone 5.0 (person_name) · axial · 0.81mm/px · z∈[-316,+89]mm · 12 of 89 slices shown, 14 images]
[im 4/89  soft-tissue]
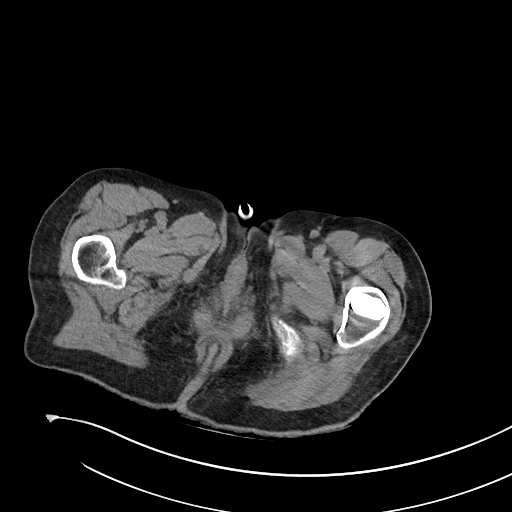
[im 4/89  bone]
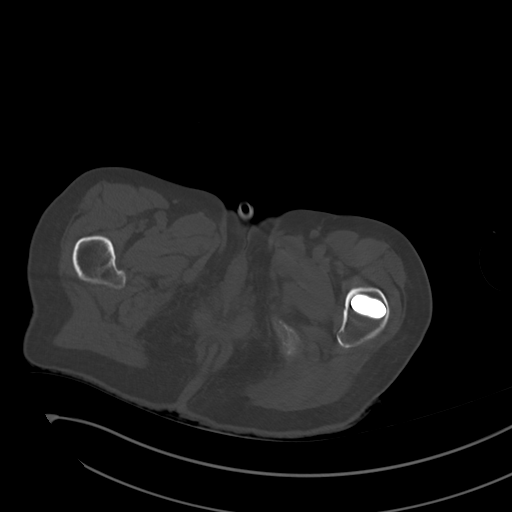
[im 12/89  soft-tissue]
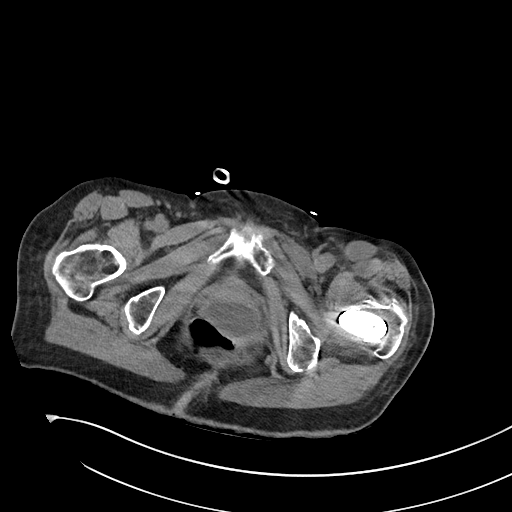
[im 19/89  soft-tissue]
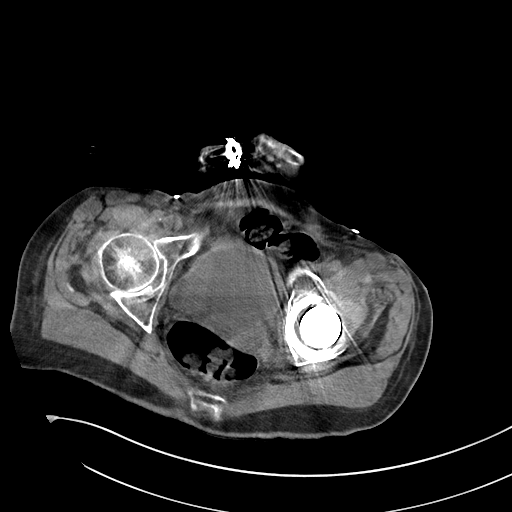
[im 26/89  soft-tissue]
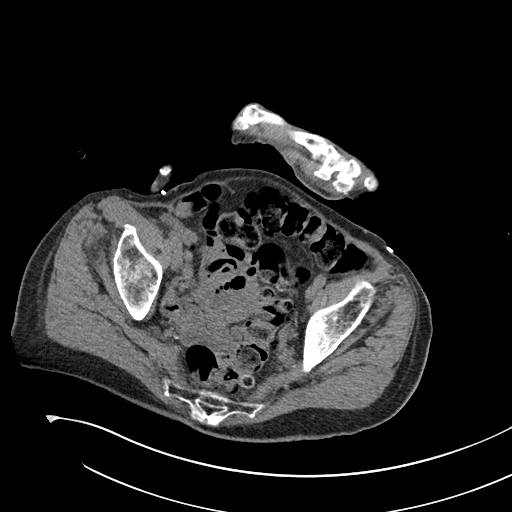
[im 34/89  soft-tissue]
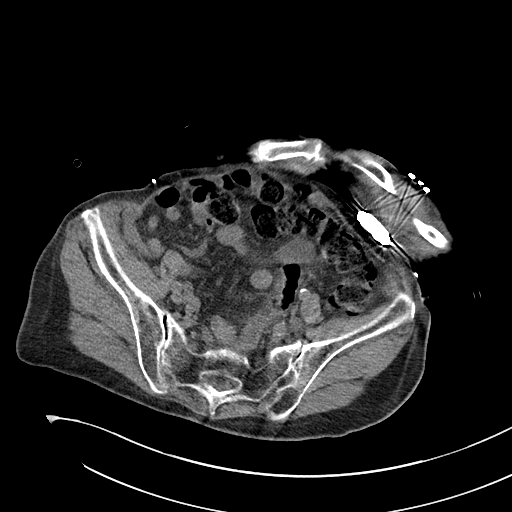
[im 41/89  soft-tissue]
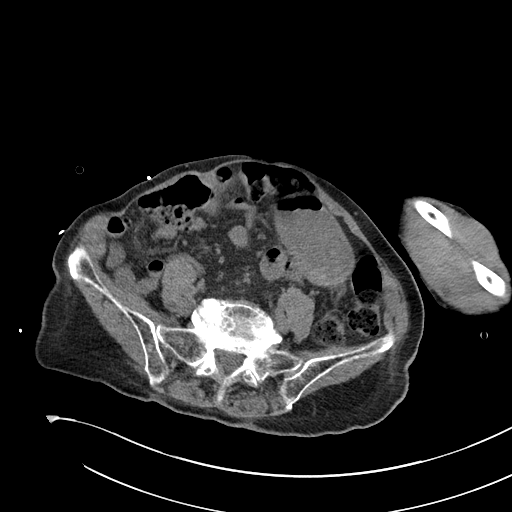
[im 48/89  soft-tissue]
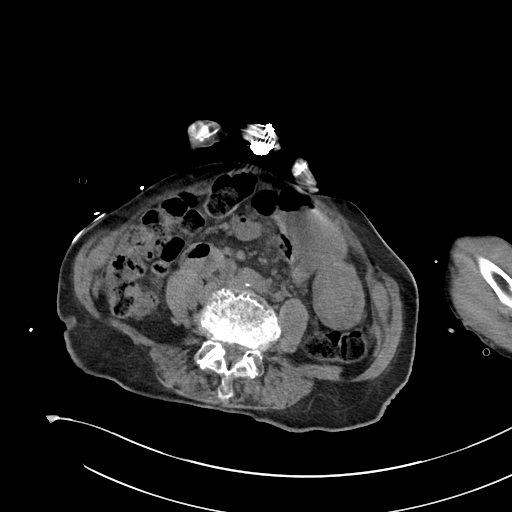
[im 56/89  soft-tissue]
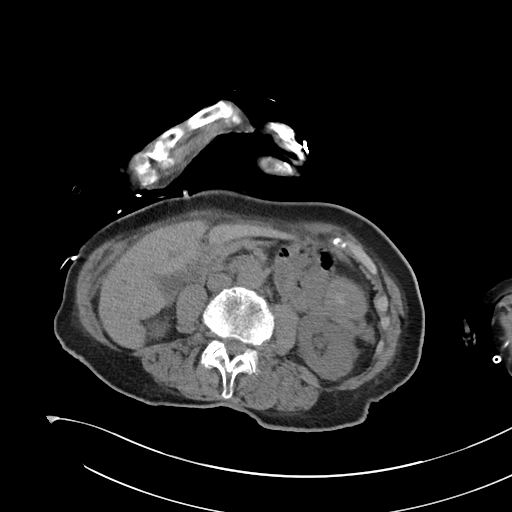
[im 63/89  soft-tissue]
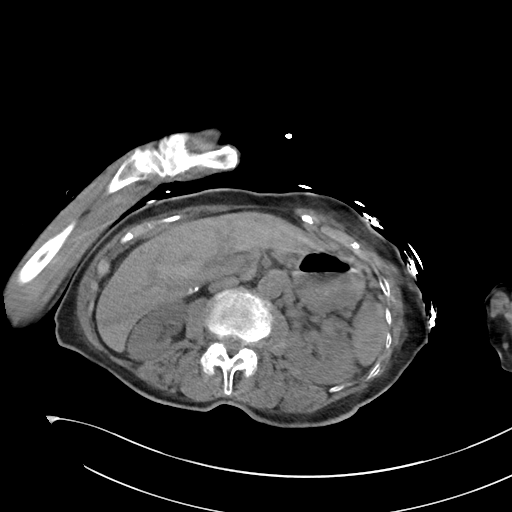
[im 63/89  bone]
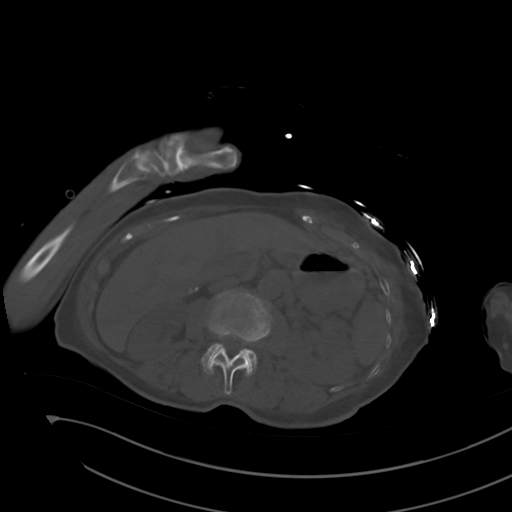
[im 70/89  soft-tissue]
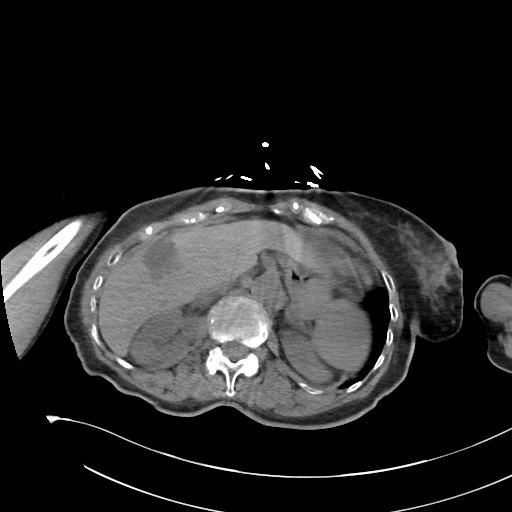
[im 78/89  soft-tissue]
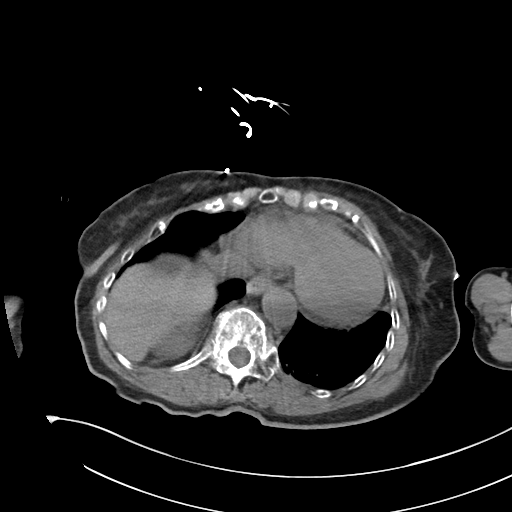
[im 85/89  soft-tissue]
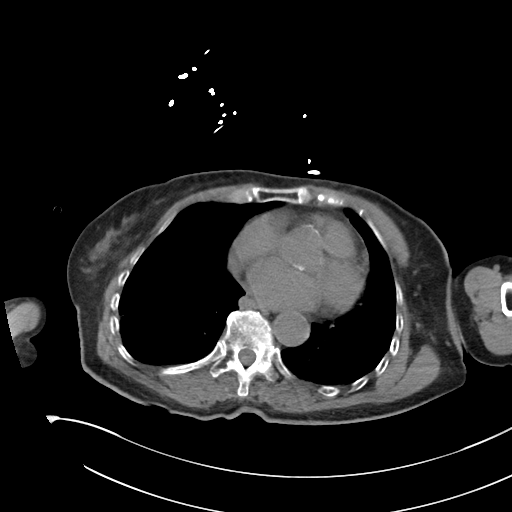

[Series 5: coronal · coronal · 0.76mm/px · 3 of 85 slices shown]
[im 29/85  soft-tissue]
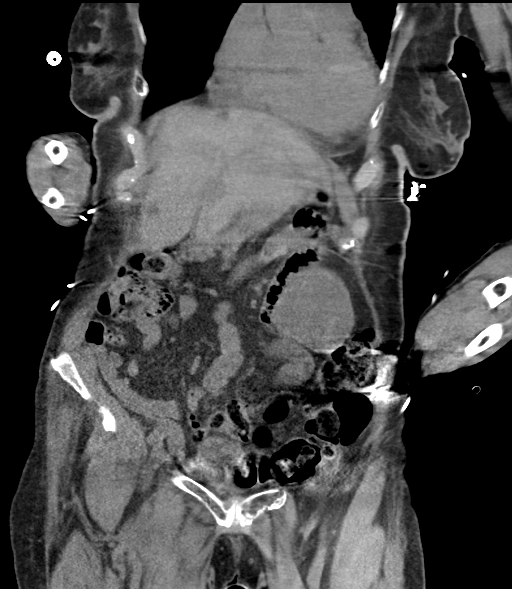
[im 38/85  soft-tissue]
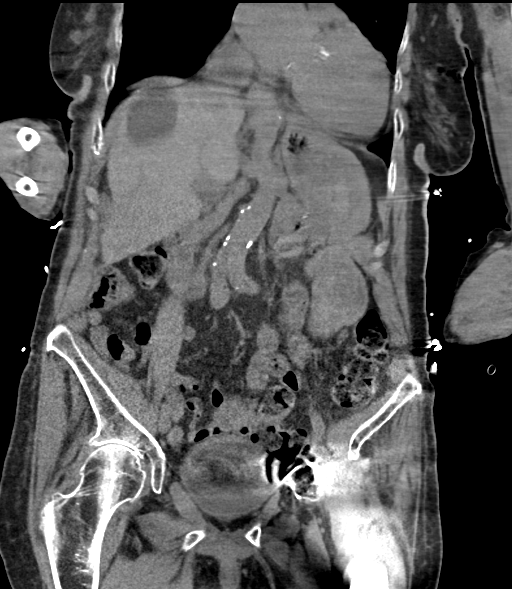
[im 47/85  soft-tissue]
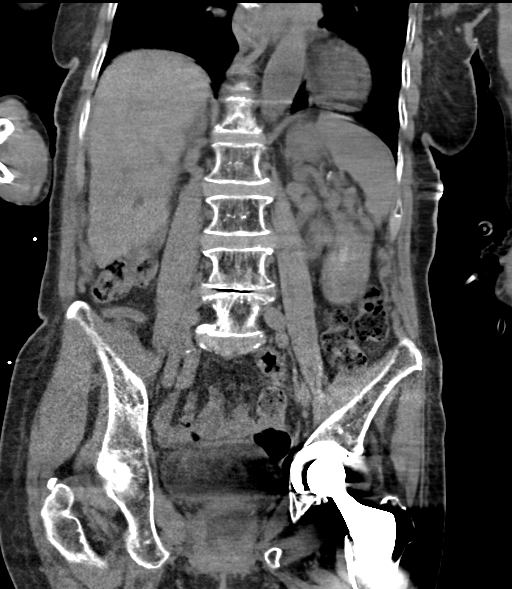

[15 of 46 positions shown; findings below may reference images not displayed]

FINDINGS: Lower chest: Heart size is enlarged with signs of calcified coronary
artery disease and calcified atherosclerosis of the thoracic aorta.
Basilar atelectasis. No effusion.

Hepatobiliary: Areas of low attenuation in the liver, largest is
well-defined and water density compatible with a cyst in the RIGHT
hepatic lobe measuring 3.8 cm.

Less well defined area in the inferior RIGHT hepatic lobe (image
32/3) 22 mm also showing low-density but not well demarcated,
potentially due to respiratory motion.

Adjacent smaller low-density area in the RIGHT hepatic lobe
measuring 7 mm. No pericholecystic stranding.

Pancreas: Signs of pancreatic atrophy. Lack of retroperitoneal fat
limits assessment, no stranding in the area of the pancreas.

Spleen: Normal size and contour.

Adrenals/Urinary Tract: Adrenal glands are normal.

Perinephric stranding bilaterally. No signs of hydronephrosis.
Urinary bladder with smooth contours without significant surrounding
stranding. No visible calcification along the course of LEFT or
RIGHT ureter to indicate ureteral calculus.

Stomach/Bowel: No acute gastrointestinal process. The appendix is
normal. Colonic diverticulosis and diverticular disease.

Vascular/Lymphatic: Aortic atherosclerosis. No aneurysmal dilation
of the abdominal aorta. There is no gastrohepatic or hepatoduodenal
ligament lymphadenopathy. No retroperitoneal or mesenteric
lymphadenopathy.

No pelvic sidewall lymphadenopathy.

Reproductive: The vagina is dilated and fluid-filled. Post
hysterectomy. This measures 5.8 x 3.3 cm in the axial plane. Some
fullness at the vaginal introitus. No adnexal mass. Streak artifact
from LEFT hip arthroplasty limiting assessment of pelvic structures.

Other: No ascites.

Musculoskeletal: Osteopenia. Spinal degenerative changes. LEFT hip
arthroplasty with resultant streak artifact over the pelvis.
Osteopenia with spinal degenerative changes. T12 compression
fracture without surrounding stranding approximately 50% loss of
height of the anterior vertebral body. Some signs of degenerative
change favoring chronicity. Osteopenia.
IMPRESSION: 1. No evidence of hydronephrosis or nephrolithiasis. Perinephric
stranding slightly greater on the LEFT than the RIGHT. Correlate
with any evidence of UTI/pyelonephritis
2. Hydrocolpos of uncertain significance. Some fullness at the
vaginal introitus. Correlate with physical examination, this may be
helpful to exclude mass lesion or stenosis.
3. Hepatic cysts but with 1 area with ill-defined margins in the
inferior RIGHT hepatic lobe. Consider follow-up non emergent
abdominal sonogram for further assessment.
4. T12 compression fracture without surrounding stranding
approximately 50% loss of height of the anterior vertebral body.
Some signs of degenerative change favoring chronicity. Correlate
with any point tenderness in this location.
5. Aortic atherosclerosis.

## 2021-03-24 IMAGING — CR DG CHEST 2V
2 series · 2 of 2 positions shown · non-contrast
Comparison: Single-view of the chest [DATE]. PA and lateral
chest [DATE].

CLINICAL DATA: Hypertension.

EXAM:
CHEST - 2 VIEW

[chest lat]
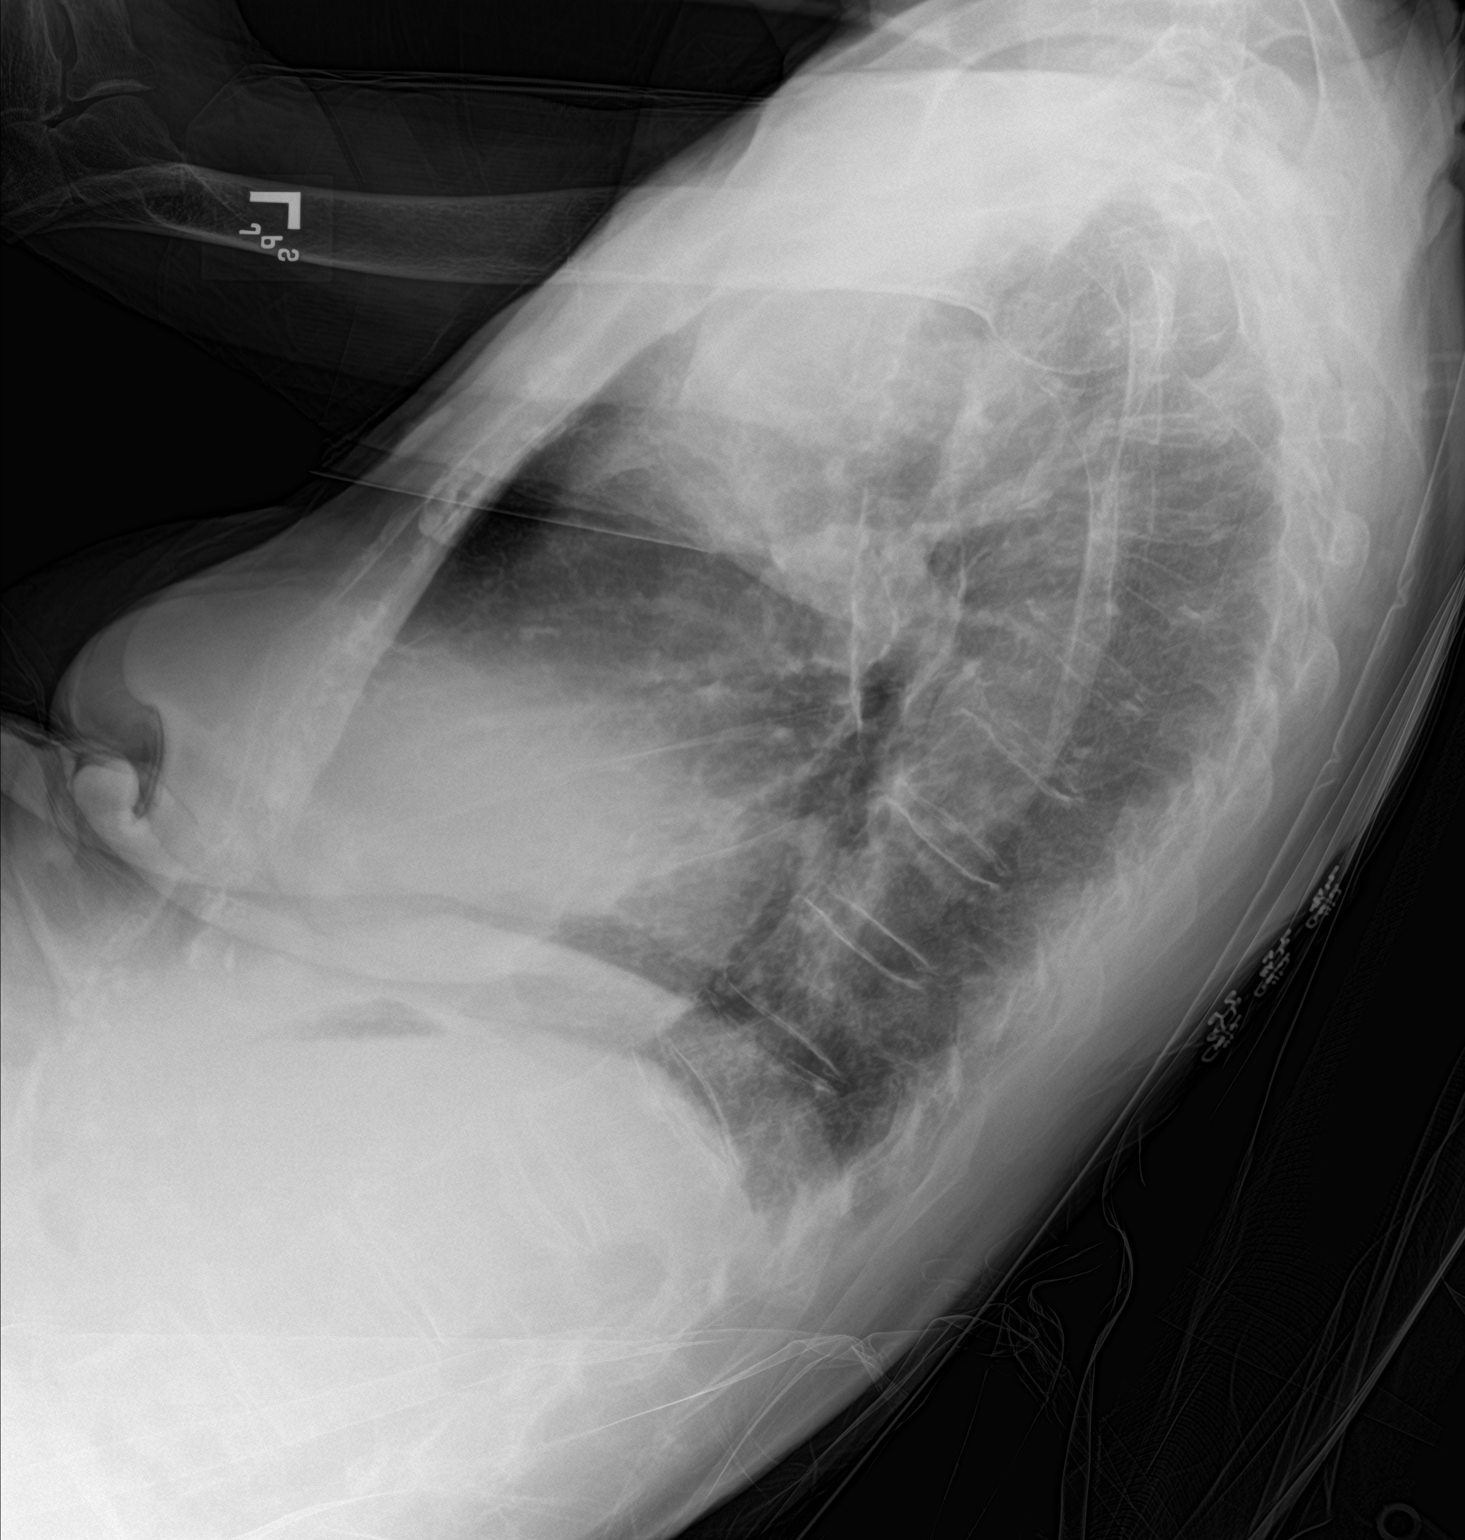

[chest ap]
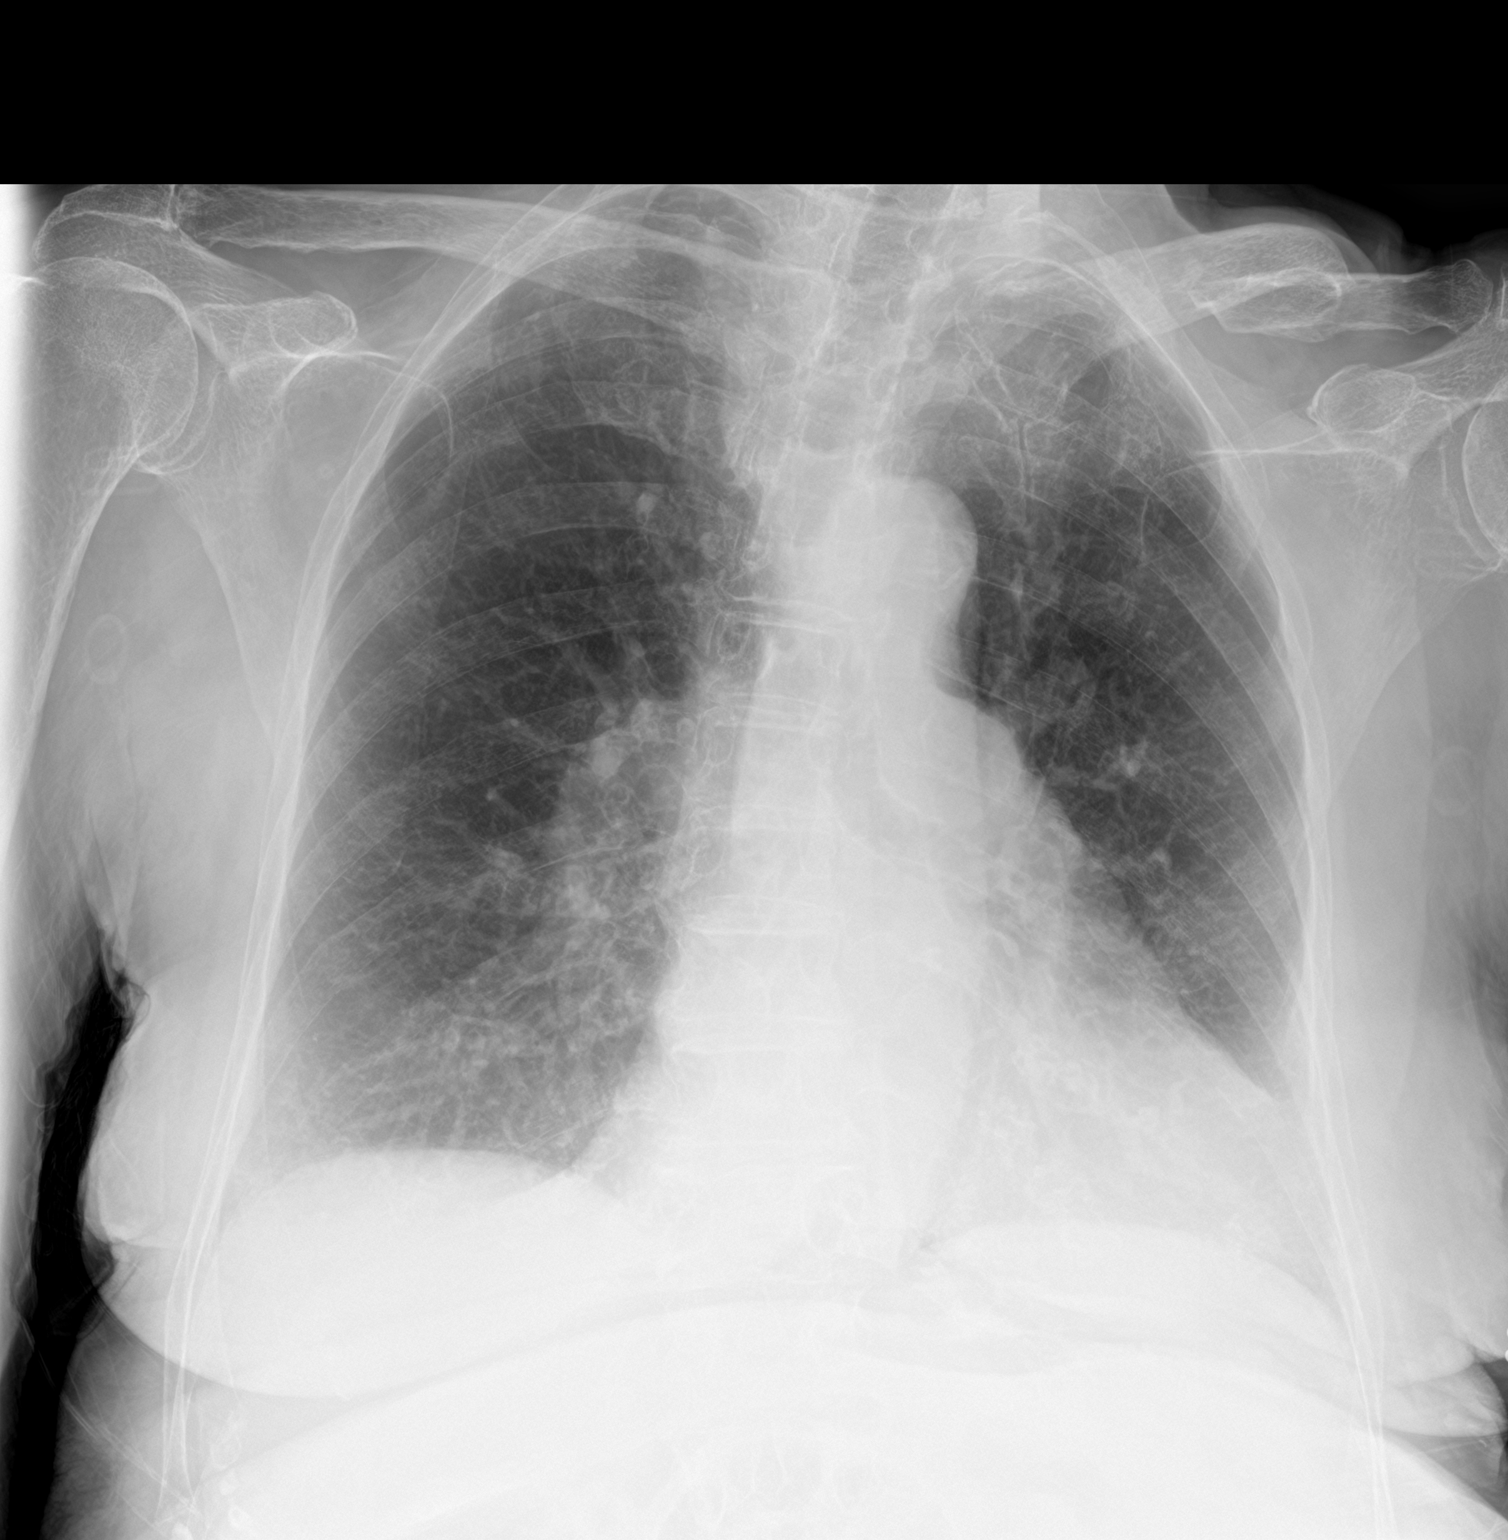

[2 of 2 positions shown; findings below may reference images not displayed]

FINDINGS: The lungs are clear. Heart size is upper normal. Aortic
atherosclerosis. No pneumothorax or pleural effusion. No acute bony
abnormality. Compression fracture at the thoracolumbar junction is
unchanged since [F7].
IMPRESSION: No acute disease.

## 2021-03-24 MED ORDER — CEPHALEXIN 250 MG PO CAPS
500.0000 mg | ORAL_CAPSULE | Freq: Once | ORAL | Status: AC
  Administered 2021-03-24: 500 mg via ORAL
  Filled 2021-03-24: qty 2

## 2021-03-24 MED ORDER — CEPHALEXIN 500 MG PO CAPS
500.0000 mg | ORAL_CAPSULE | Freq: Four times a day (QID) | ORAL | 0 refills | Status: DC
Start: 2021-03-24 — End: 2021-06-01

## 2021-03-24 MED ORDER — LABETALOL HCL 5 MG/ML IV SOLN
10.0000 mg | Freq: Once | INTRAVENOUS | Status: AC
  Administered 2021-03-24: 10 mg via INTRAVENOUS
  Filled 2021-03-24: qty 4

## 2021-03-24 NOTE — ED Notes (Signed)
Patient transported to X-ray 

## 2021-03-24 NOTE — ED Notes (Signed)
Patient transported to Ultrasound 

## 2021-03-24 NOTE — ED Triage Notes (Signed)
Patient here after stating her blood pressure was high after checking her blood pressure at home. Patient has tremor in both arms. BP in triage 211/111. Patient denies pain. Patient alert, oriented, and in no apparent distress at this time.

## 2021-03-24 NOTE — ED Notes (Signed)
EDP Dr. Particia Nearing notified of troponin change/elevation.

## 2021-03-24 NOTE — ED Provider Notes (Signed)
Emergency Medicine Provider Triage Evaluation Note  Alicia Vang , a 87 y.o. female  was evaluated in triage.  Pt complains of high blood pressure.  States that she checked her blood pressure at home and it was high.  She has been feeling "short winded" for the past few days.  Denies any chest pain or abdominal pain.  Review of Systems  Positive: Shortness of breath Negative: Chest pain, abdominal pain  Physical Exam  There were no vitals taken for this visit. Gen:   Awake, no distress   Resp:  Normal effort  MSK:   Moves extremities without difficulty  Other:  Speaking in complete sentences, abdomen is distended  Medical Decision Making  Medically screening exam initiated at 12:22 PM.  Appropriate orders placed.  Alicia Vang was informed that the remainder of the evaluation will be completed by another provider, this initial triage assessment does not replace that evaluation, and the importance of remaining in the ED until their evaluation is complete.  Will order blood work, EKG and chest x-ray   Dietrich Pates, PA-C 03/24/21 1223    Margarita Grizzle, MD 03/28/21 1341

## 2021-03-24 NOTE — ED Provider Notes (Signed)
Pt signed out by Dr. Fredderick Phenix.  Pt's CT showed nothing acute.  BP still elevated, so she was given 10 mg of labetalol.  Pt's bp did respond to that and is down to the 160s-170s.  Pt's daughter showed me the log of bp measurements that she takes every day.  Bp runs high.  Pt noted to have hematuria.  CT renal was done which showed perinephric stranding.  Due to the hematuria, pt will be treated for UTI.  Pt is stable for d/c.  Return if worse.  F/u with pcp regarding bp meds.    IMPRESSION:  1. No evidence of hydronephrosis or nephrolithiasis. Perinephric  stranding slightly greater on the LEFT than the RIGHT. Correlate  with any evidence of UTI/pyelonephritis  2. Hydrocolpos of uncertain significance. Some fullness at the  vaginal introitus. Correlate with physical examination, this may be  helpful to exclude mass lesion or stenosis.  3. Hepatic cysts but with 1 area with ill-defined margins in the  inferior RIGHT hepatic lobe. Consider follow-up non emergent  abdominal sonogram for further assessment.  4. T12 compression fracture without surrounding stranding  approximately 50% loss of height of the anterior vertebral body.  Some signs of degenerative change favoring chronicity. Correlate  with any point tenderness in this location.  5. Aortic atherosclerosis.      Jacalyn Lefevre, MD 03/24/21 [phone]

## 2021-03-24 NOTE — ED Notes (Addendum)
Dr. Fredderick Phenix out of room. Pending CT head. Alert, NAD, calm, interactive, resps e/u, speech clear, denies pain, nausea, or sob. Mentions some dizziness/ light headedness. Denies questions or needs.

## 2021-03-24 NOTE — ED Provider Notes (Signed)
[facility] EMERGENCY DEPARTMENT Provider Note   CSN: [id number] Arrival date & time: 03/24/21  1214     History Chief Complaint  Patient presents with  . Hypertension    Alicia Vang is a 87 y.o. female.  And arePatient is a 87 year old female with a history of diabetes, hypertension radius as well as tremor who presents with elevated blood pressure.  She is here with her daughter.  She lives at home with her daughter.  Her daughter states that she had walked down to the club and while she was gone the patient reportedly got short of breath.  When she came back she was noted to have a high blood pressure in the 200 range.  She has not had any significantly increased blood pressures recently.  She currently does not have any symptoms.  She denies any chest pain or shortness of breath.  She said she was feeling dizzy when this started but denies any now.  She denies any headache.  No recent illnesses.  No nausea or vomiting.  Her daughter has not noticed any change in her behavior other than the dizziness and shortness of breath.  Apparently she did not take her medications yesterday but she did take them today.        Past Medical History:  Diagnosis Date  . Arthritis   . Diabetes mellitus without complication (HCC)    Type II  . Hypertension   . Hypokalemia     Patient Active Problem List   Diagnosis Date Noted  . COVID-19 virus infection 10/21/2020  . Bradycardia 10/21/2020  . Type 2 diabetes mellitus (HCC) 10/21/2020  . Primary osteoarthritis of left hip 10/26/2017  . Osteoarthritis of left hip 08/17/2017    Past Surgical History:  Procedure Laterality Date  . ABDOMINAL HYSTERECTOMY    . CHALAZION EXCISION  11/06/2011   Procedure: MINOR EXCISION OF CHALAZION;  Surgeon: Vita Erm.;  Location: [facility];  Service: Ophthalmology;  Laterality: Left;  . CHALAZION EXCISION  02/02/2012   Procedure: MINOR EXCISION OF  CHALAZION;  Surgeon: Vita Erm., MD;  Location: [facility];  Service: Ophthalmology;  Laterality: Left;  left eye upper lid  . CHALAZION EXCISION Right 03/28/2013   Procedure: MINOR EXCISION OF CHALAZION upper and lower right eye ;  Surgeon: Vita Erm., MD;  Location: [facility];  Service: Ophthalmology;  Laterality: Right;  . CHALAZION EXCISION Right 03/28/2013   Procedure: MINOR EXCISION OF CHALAZION UPPER AND LOWER RIGHT EYE  ;  Surgeon: Vita Erm., MD;  Location: [facility] OR;  Service: Ophthalmology;  Laterality: Right;  . COLONOSCOPY    . TOTAL HIP ARTHROPLASTY Left 10/26/2017   Procedure: TOTAL HIP ARTHROPLASTY ANTERIOR APPROACH;  Surgeon: Gean Birchwood, MD;  Location: [facility] OR;  Service: Orthopedics;  Laterality: Left;     OB History   No obstetric history on file.     No family history on file.  Social History   Tobacco Use  . Smoking status: Never Smoker  . Smokeless tobacco: Never Used  Vaping Use  . Vaping Use: Never used  Substance Use Topics  . Alcohol use: No  . Drug use: No    Home Medications Prior to Admission medications   Medication Sig Start Date End Date Taking? Authorizing Provider  acetaminophen (TYLENOL) 325 MG tablet Take 2 tablets (650 mg total) by mouth every 6 (six) hours as needed for mild pain  or headache (fever >/= 101). 10/24/20  Yes Lonia Blood, MD  brinzolamide (AZOPT) 1 % ophthalmic suspension Place 1 drop into both eyes at bedtime. 03/10/21  Yes [provider]  chlorthalidone (HYGROTON) 25 MG tablet Take 25 mg by mouth daily as needed (feet swelling). 10/27/20  Yes [provider]  glimepiride (AMARYL) 2 MG tablet Take 4 mg by mouth daily with breakfast.    Yes [provider]  latanoprost (XALATAN) 0.005 % ophthalmic solution Place 1 drop into both eyes at bedtime.   Yes [provider]  losartan (COZAAR) 100 MG tablet Take 100 mg by mouth daily.   Yes  [provider]  Metoprolol Succinate 50 MG CS24 Take 50 mg by mouth daily.    Yes [provider]  Potassium Chloride ER 20 MEQ TBCR Take 20 mEq by mouth daily as needed (feet swelling). 10/26/20  Yes [provider]  XARELTO 15 MG TABS tablet Take 15 mg by mouth daily. 03/17/21  Yes [provider]  XIIDRA 5 % SOLN Place 1 drop into both eyes in the morning and at bedtime. 07/26/20  Yes [provider]    Allergies    Amlodipine  Review of Systems   Review of Systems  Constitutional: Negative for chills, diaphoresis, fatigue and fever.  HENT: Negative for congestion, rhinorrhea and sneezing.   Eyes: Negative.   Respiratory: Positive for shortness of breath. Negative for cough and chest tightness.   Cardiovascular: Negative for chest pain and leg swelling.  Gastrointestinal: Negative for abdominal pain, blood in stool, diarrhea, nausea and vomiting.  Genitourinary: Negative for difficulty urinating, flank pain, frequency and hematuria.  Musculoskeletal: Negative for arthralgias and back pain.  Skin: Negative for rash.  Neurological: Positive for dizziness. Negative for speech difficulty, weakness, numbness and headaches.    Physical Exam Updated Vital Signs BP (!) 171/158   Pulse 80   Temp 99.4 F (37.4 C) (Oral)   Resp (!) 23   SpO2 98%   Physical Exam Constitutional:      Appearance: She is well-developed.  HENT:     Head: Normocephalic and atraumatic.  Eyes:     Pupils: Pupils are equal, round, and reactive to light.  Cardiovascular:     Rate and Rhythm: Normal rate and regular rhythm.     Heart sounds: Normal heart sounds.  Pulmonary:     Effort: Pulmonary effort is normal. No respiratory distress.     Breath sounds: Normal breath sounds. No wheezing or rales.  Chest:     Chest wall: No tenderness.  Abdominal:     General: Bowel sounds are normal.     Palpations: Abdomen is soft.     Tenderness: There is no abdominal  tenderness. There is no guarding or rebound.  Musculoskeletal:        General: Normal range of motion.     Cervical back: Normal range of motion and neck supple.  Lymphadenopathy:     Cervical: No cervical adenopathy.  Skin:    General: Skin is warm and dry.     Findings: No rash.  Neurological:     Mental Status: She is alert and oriented to person, place, and time.     Comments: Motor 5/5 all extremities Sensation grossly intact to LT all extremities Finger to Nose intact, no pronator drift CN II-XII grossly intact       ED Results / Procedures / Treatments   Labs (all labs ordered are listed, but only  abnormal results are displayed) Labs Reviewed  BASIC METABOLIC PANEL - Abnormal; Notable for the following components:      Result Value   Glucose, Bld 227 (*)    GFR, Estimated 56 (*)    All other components within normal limits  CBC WITH DIFFERENTIAL/PLATELET  TROPONIN I (HIGH SENSITIVITY)  TROPONIN I (HIGH SENSITIVITY)    EKG EKG Interpretation  Date/Time:  Thursday March 24 2021 15:10:19 EDT Ventricular Rate:  59 PR Interval:  152 QRS Duration: 105 QT Interval:  473 QTC Calculation: 469 R Axis:   -71 Text Interpretation: Atrial flutter vs sinus rhythm with artifact Left anterior fascicular block Abnormal R-wave progression, late transition Probable left ventricular hypertrophy Confirmed by Rolan Bucco [phone]) on 03/24/2021 3:13:50 PM   Radiology No results found.  Procedures Procedures   Medications Ordered in ED Medications - No data to display  ED Course  I have reviewed the triage vital signs and the nursing notes.  Pertinent labs & imaging results that were available during my care of the patient were reviewed by me and considered in my medical decision making (see chart for details).    MDM Rules/Calculators/A&P                          Patient is a 88 year old female who presents with elevated blood pressures.  She had some associated  dizziness and shortness of breath but this has resolved.  She is back to baseline now per her daughter.  She does not have any neurologic deficits.  No associated chest pain.  Her EKG showed sinus rhythm with artifact versus atrial flutter.  I would favor this being a sinus rhythm as she has a tremor which likely is causing it to look like possible a flutter.  She is not having any current cardiac symptoms.  Her rate is well controlled.  Her labs so far are nonconcerning.  We are awaiting a urinalysis.  Her chest x-ray is clear without evidence of pneumonia or pneumothorax.  Head CT is pending.  Her blood pressure is improving.  We will continue to monitor.  Dr. Particia Nearing to take over care pending these results. Final Clinical Impression(s) / ED Diagnoses Final diagnoses:  Hypertension, unspecified type  Dizziness    Rx / DC Orders ED Discharge Orders    None       Rolan Bucco, MD 03/24/21 1540

## 2021-03-24 NOTE — ED Notes (Signed)
Daughter back at [facility], pending CT results, labetalol given.

## 2021-03-24 NOTE — ED Notes (Signed)
Back from CT. EDP at [facility].

## 2021-05-31 ENCOUNTER — Emergency Department (HOSPITAL_COMMUNITY): Payer: Medicare PPO

## 2021-05-31 ENCOUNTER — Observation Stay (HOSPITAL_COMMUNITY)
Admission: EM | Admit: 2021-05-31 | Discharge: 2021-06-06 | Disposition: A | Discharge status: Home / Self Care | Payer: Medicare PPO | Attending: Family Medicine | Admitting: Family Medicine

## 2021-05-31 ENCOUNTER — Other Ambulatory Visit: Payer: Self-pay

## 2021-05-31 ENCOUNTER — Encounter (HOSPITAL_COMMUNITY): Payer: Self-pay

## 2021-05-31 DIAGNOSIS — Z23 Encounter for immunization: Secondary | ICD-10-CM | POA: Diagnosis not present

## 2021-05-31 DIAGNOSIS — Z7901 Long term (current) use of anticoagulants: Secondary | ICD-10-CM | POA: Diagnosis not present

## 2021-05-31 DIAGNOSIS — W109XXA Fall (on) (from) unspecified stairs and steps, initial encounter: Secondary | ICD-10-CM | POA: Insufficient documentation

## 2021-05-31 DIAGNOSIS — Y92009 Unspecified place in unspecified non-institutional (private) residence as the place of occurrence of the external cause: Secondary | ICD-10-CM | POA: Insufficient documentation

## 2021-05-31 DIAGNOSIS — E119 Type 2 diabetes mellitus without complications: Secondary | ICD-10-CM | POA: Insufficient documentation

## 2021-05-31 DIAGNOSIS — Z79899 Other long term (current) drug therapy: Secondary | ICD-10-CM | POA: Diagnosis not present

## 2021-05-31 DIAGNOSIS — Z8616 Personal history of COVID-19: Secondary | ICD-10-CM | POA: Insufficient documentation

## 2021-05-31 DIAGNOSIS — Z96642 Presence of left artificial hip joint: Secondary | ICD-10-CM | POA: Diagnosis not present

## 2021-05-31 DIAGNOSIS — S0990XA Unspecified injury of head, initial encounter: Secondary | ICD-10-CM | POA: Diagnosis not present

## 2021-05-31 DIAGNOSIS — Z20822 Contact with and (suspected) exposure to covid-19: Secondary | ICD-10-CM | POA: Diagnosis not present

## 2021-05-31 DIAGNOSIS — I10 Essential (primary) hypertension: Secondary | ICD-10-CM | POA: Diagnosis not present

## 2021-05-31 DIAGNOSIS — G20 Parkinson's disease: Secondary | ICD-10-CM | POA: Diagnosis present

## 2021-05-31 DIAGNOSIS — S3992XA Unspecified injury of lower back, initial encounter: Secondary | ICD-10-CM | POA: Diagnosis present

## 2021-05-31 DIAGNOSIS — S32038A Other fracture of third lumbar vertebra, initial encounter for closed fracture: Secondary | ICD-10-CM | POA: Diagnosis not present

## 2021-05-31 DIAGNOSIS — S32030G Wedge compression fracture of third lumbar vertebra, subsequent encounter for fracture with delayed healing: Secondary | ICD-10-CM | POA: Diagnosis present

## 2021-05-31 DIAGNOSIS — S32030A Wedge compression fracture of third lumbar vertebra, initial encounter for closed fracture: Secondary | ICD-10-CM | POA: Diagnosis present

## 2021-05-31 LAB — COMPREHENSIVE METABOLIC PANEL WITH GFR
ALT: 23 U/L (ref 0–44)
AST: 24 U/L (ref 15–41)
Albumin: 3.8 g/dL (ref 3.5–5.0)
Alkaline Phosphatase: 48 U/L (ref 38–126)
Anion gap: 12 (ref 5–15)
BUN: 33 mg/dL — ABNORMAL HIGH (ref 8–23)
CO2: 28 mmol/L (ref 22–32)
Calcium: 9.7 mg/dL (ref 8.9–10.3)
Chloride: 94 mmol/L — ABNORMAL LOW (ref 98–111)
Creatinine, Ser: 1.15 mg/dL — ABNORMAL HIGH (ref 0.44–1.00)
GFR, Estimated: 45 mL/min — ABNORMAL LOW
Glucose, Bld: 300 mg/dL — ABNORMAL HIGH (ref 70–99)
Potassium: 2.9 mmol/L — ABNORMAL LOW (ref 3.5–5.1)
Sodium: 134 mmol/L — ABNORMAL LOW (ref 135–145)
Total Bilirubin: 1.2 mg/dL (ref 0.3–1.2)
Total Protein: 7.8 g/dL (ref 6.5–8.1)

## 2021-05-31 LAB — CBC WITH DIFFERENTIAL/PLATELET
Abs Immature Granulocytes: 0.04 K/uL (ref 0.00–0.07)
Basophils Absolute: 0 K/uL (ref 0.0–0.1)
Basophils Relative: 0 %
Eosinophils Absolute: 0.2 K/uL (ref 0.0–0.5)
Eosinophils Relative: 2 %
HCT: 37 % (ref 36.0–46.0)
Hemoglobin: 11.8 g/dL — ABNORMAL LOW (ref 12.0–15.0)
Immature Granulocytes: 1 %
Lymphocytes Relative: 8 %
Lymphs Abs: 0.6 K/uL — ABNORMAL LOW (ref 0.7–4.0)
MCH: 29.2 pg (ref 26.0–34.0)
MCHC: 31.9 g/dL (ref 30.0–36.0)
MCV: 91.6 fL (ref 80.0–100.0)
Monocytes Absolute: 0.7 K/uL (ref 0.1–1.0)
Monocytes Relative: 10 %
Neutro Abs: 5.5 K/uL (ref 1.7–7.7)
Neutrophils Relative %: 79 %
Platelets: 229 K/uL (ref 150–400)
RBC: 4.04 MIL/uL (ref 3.87–5.11)
RDW: 14.3 % (ref 11.5–15.5)
WBC: 7 K/uL (ref 4.0–10.5)
nRBC: 0 % (ref 0.0–0.2)

## 2021-05-31 LAB — PROTIME-INR
INR: 1.2 (ref 0.8–1.2)
Prothrombin Time: 15 seconds (ref 11.4–15.2)

## 2021-05-31 LAB — URINALYSIS, ROUTINE W REFLEX MICROSCOPIC
Bacteria, UA: NONE SEEN
Bilirubin Urine: NEGATIVE
Glucose, UA: 150 mg/dL — AB
Ketones, ur: NEGATIVE mg/dL
Leukocytes,Ua: NEGATIVE
Nitrite: NEGATIVE
Protein, ur: 100 mg/dL — AB
Specific Gravity, Urine: 1.019 (ref 1.005–1.030)
pH: 5 (ref 5.0–8.0)

## 2021-05-31 IMAGING — CT CT CERVICAL SPINE W/O CM
3 series · 10 of 35 positions shown, 12 images · non-contrast
Comparison: CT brain [DATE]

CLINICAL DATA: Unwitnessed fall

EXAM:
CT HEAD WITHOUT CONTRAST
CT CERVICAL SPINE WITHOUT CONTRAST
TECHNIQUE: Multidetector CT imaging of the head and cervical spine was
performed following the standard protocol without intravenous
contrast. Multiplanar CT image reconstructions of the cervical spine
were also generated.

[Series 5: c_spine 2.0 ax bone · axial · 0.22mm/px · z∈[-197,-123]mm · 2 of 94 slices shown, 3 images]
[im 29/94  soft-tissue]
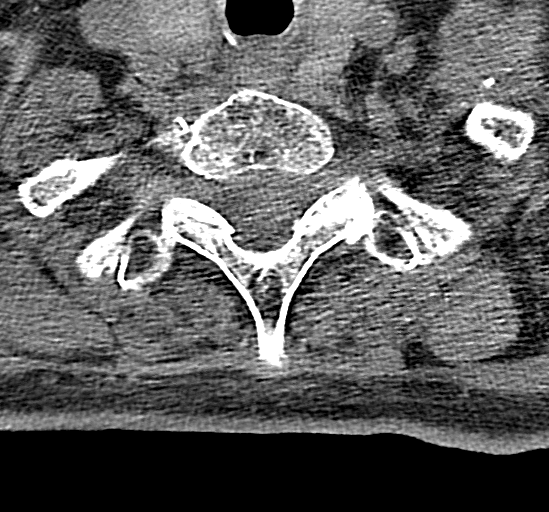
[im 29/94  bone]
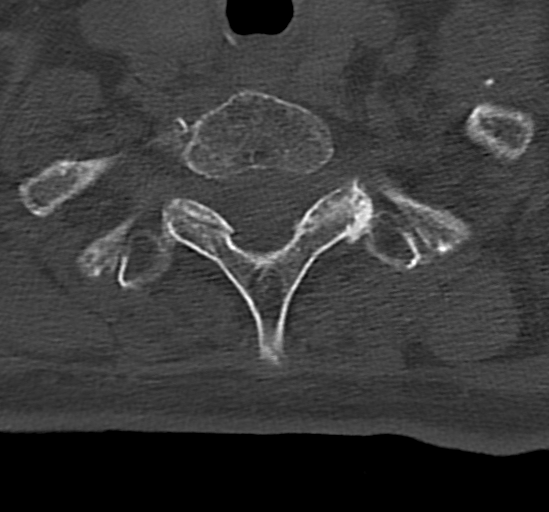
[im 72/94  bone]
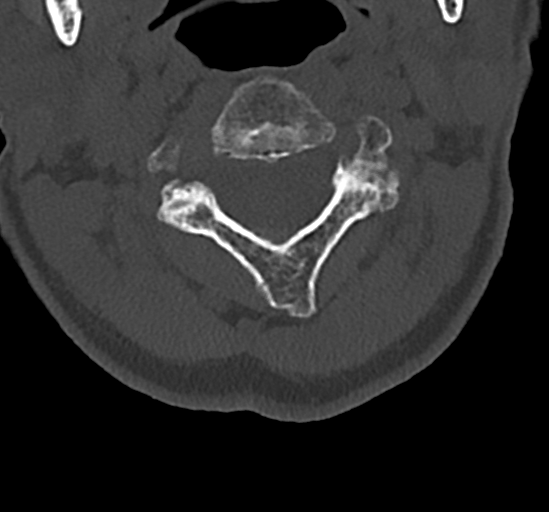

[Series 6: c_spine 2.0 sag bone · sagittal · 0.21mm/px · 5 of 57 slices shown, 6 images]
[im 19/57  bone]
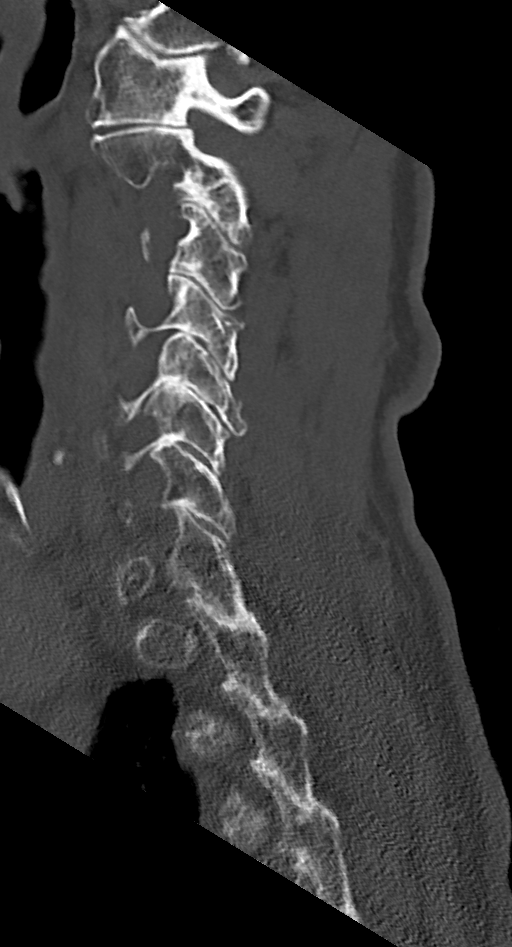
[im 24/57  bone]
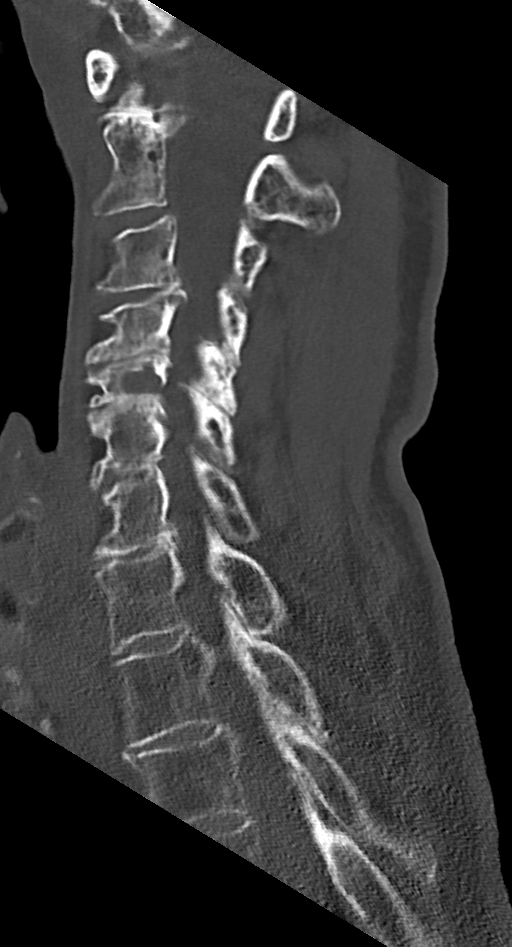
[im 29/57  soft-tissue]
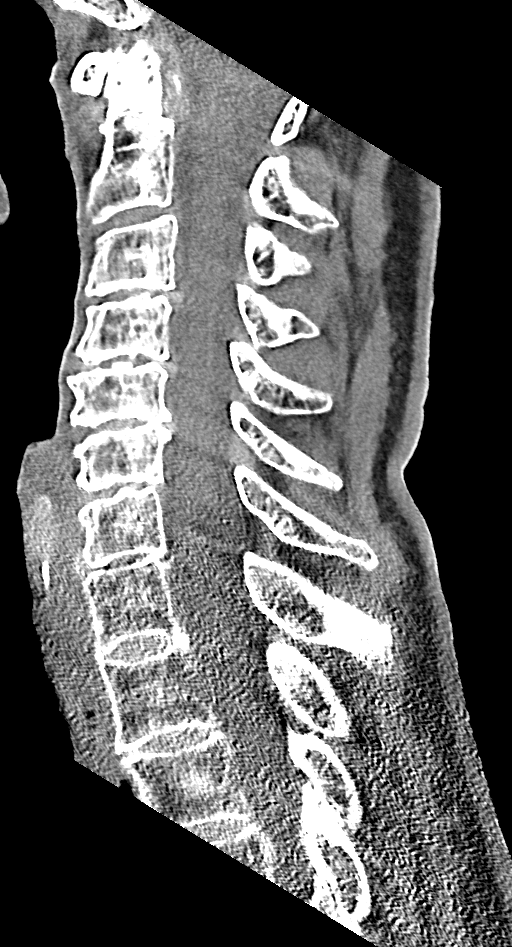
[im 29/57  bone]
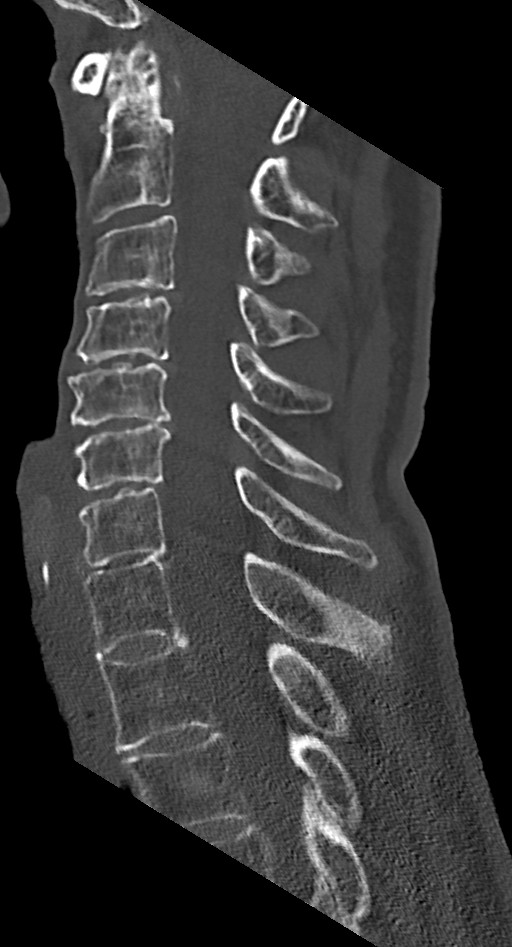
[im 33/57  bone]
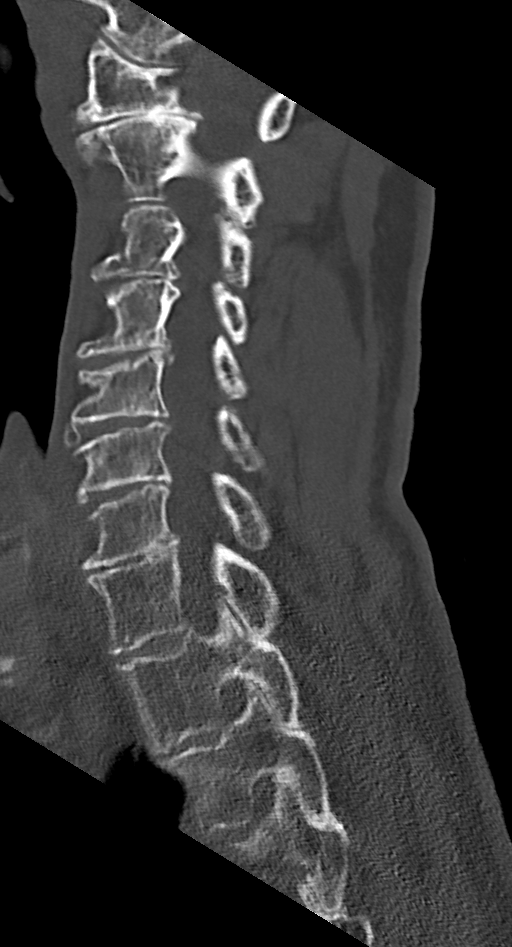
[im 38/57  bone]
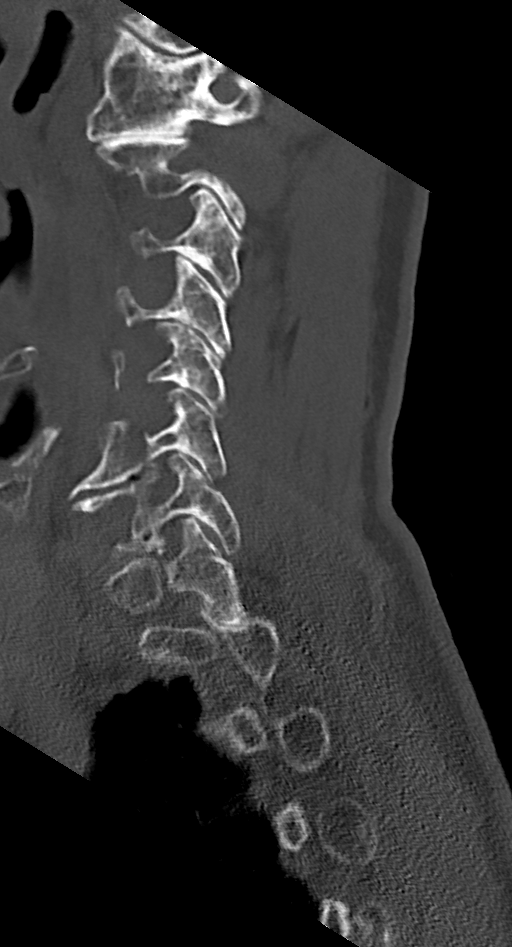

[Series 7: c_spine 2.0 cor bone · coronal · 0.23mm/px · 3 of 50 slices shown]
[im 12/50  bone]
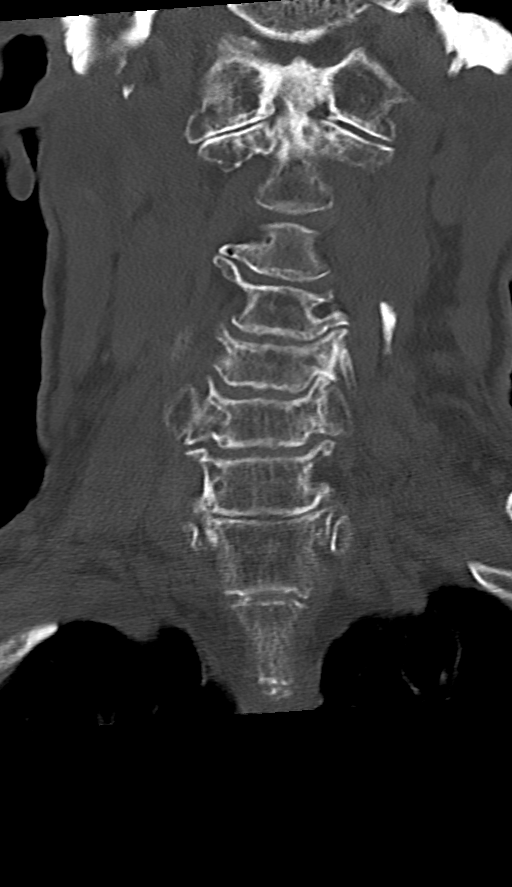
[im 21/50  bone]
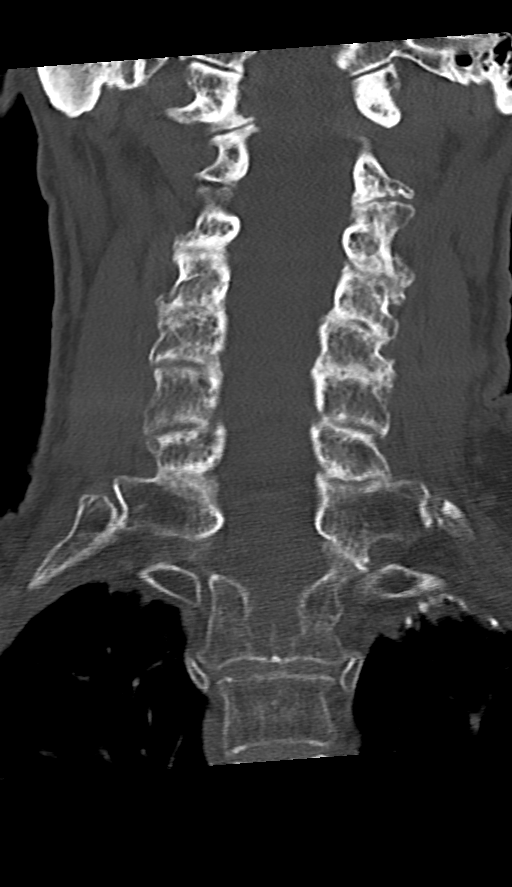
[im 30/50  bone]
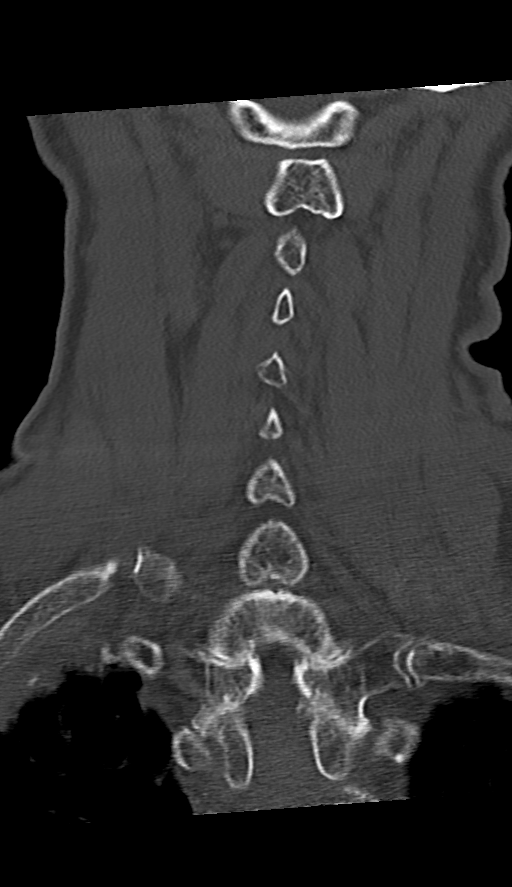

[10 of 35 positions shown; findings below may reference images not displayed]

FINDINGS: CT HEAD FINDINGS

Brain: No acute territorial infarction, hemorrhage or intracranial
mass. Chronic infarct in the right basal ganglia. Atrophy and
chronic small vessel ischemic changes of the white matter

Vascular: No hyperdense vessel.  No unexpected calcification

Skull: Normal. Negative for fracture or focal lesion.

Sinuses/Orbits: No acute finding.

Other: None

CT CERVICAL SPINE FINDINGS

Alignment: No subluxation.  Facet alignment maintained

Skull base and vertebrae: No acute fracture. No primary bone lesion
or focal pathologic process.

Soft tissues and spinal canal: No prevertebral fluid or swelling. No
visible canal hematoma.

Disc levels: Moderate severe diffuse degenerative change C3 through
T1. Facet degenerative changes at multiple levels with foraminal
stenosis.

Upper chest: Negative.

Other: None
IMPRESSION: 1. No CT evidence for acute intracranial abnormality. Atrophy and
chronic small vessel ischemic changes of the white matter
2. Degenerative changes of the cervical spine. No acute osseous
abnormality

## 2021-05-31 IMAGING — CT CT L SPINE W/O CM
3 of 4 series · 10 of 33 positions shown, 11 images · non-contrast
Comparison: CT renal [DATE].

CLINICAL DATA: Back trauma, no prior imaging (Age >= 16y)

EXAM:
CT LUMBAR SPINE WITHOUT CONTRAST
TECHNIQUE: Multidetector CT imaging of the lumbar spine was performed without
intravenous contrast administration. Multiplanar CT image
reconstructions were also generated.

[Series 3: l-spine 2.0 st · axial · 0.26mm/px · z∈[-547,-457]mm · 2 of 135 slices shown, 3 images]
[im 45/135  soft-tissue]
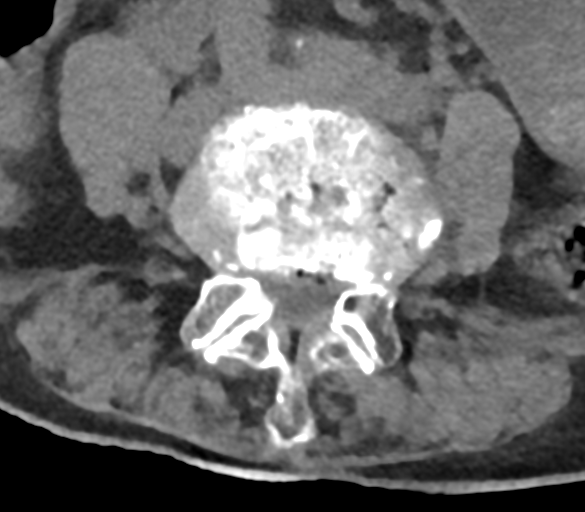
[im 45/135  bone]
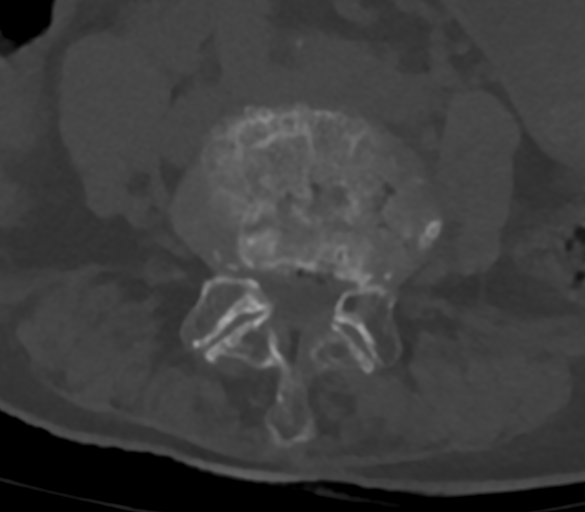
[im 90/135  bone]
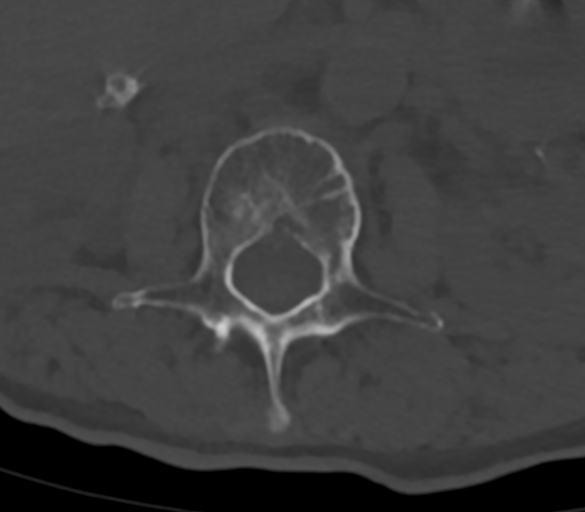

[Series 7: l-spine 2.0 cor · coronal · 0.31mm/px · 3 of 61 slices shown]
[im 13/61  bone]
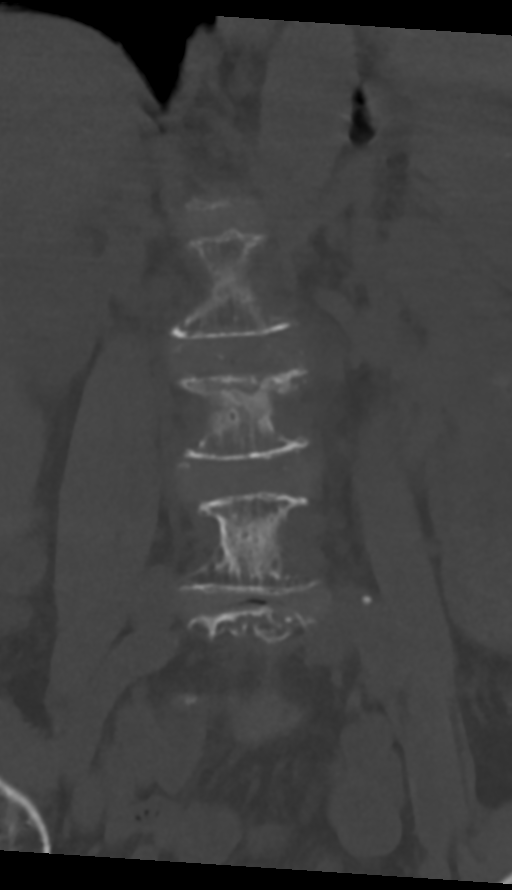
[im 25/61  bone]
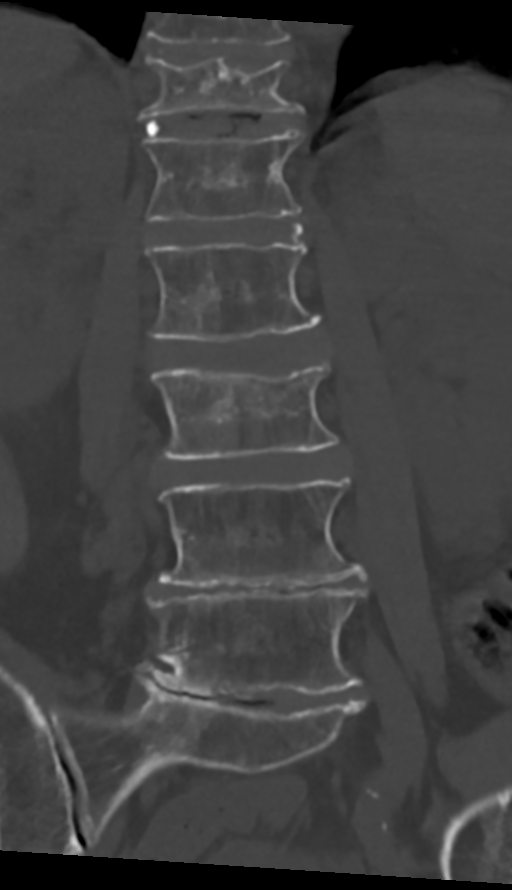
[im 37/61  bone]
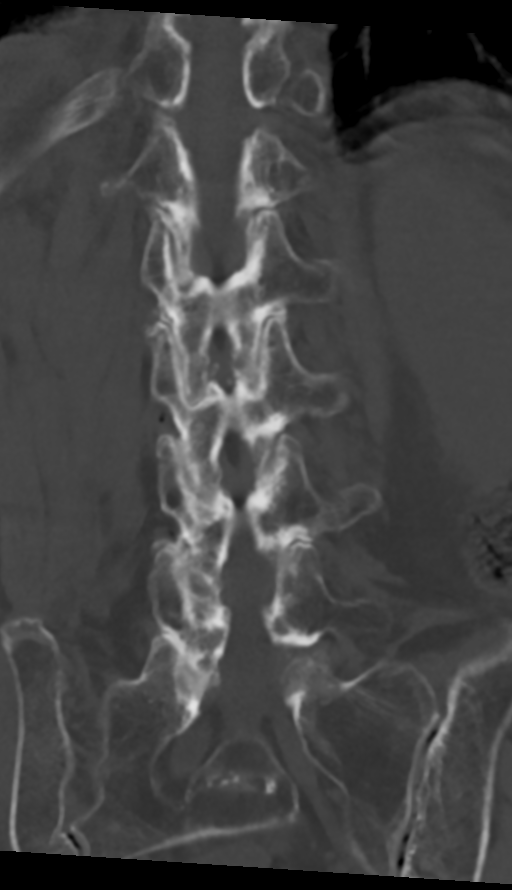

[Series 8: l-spine 2.0 sag · sagittal · 0.25mm/px · 5 of 61 slices shown]
[im 21/61  bone]
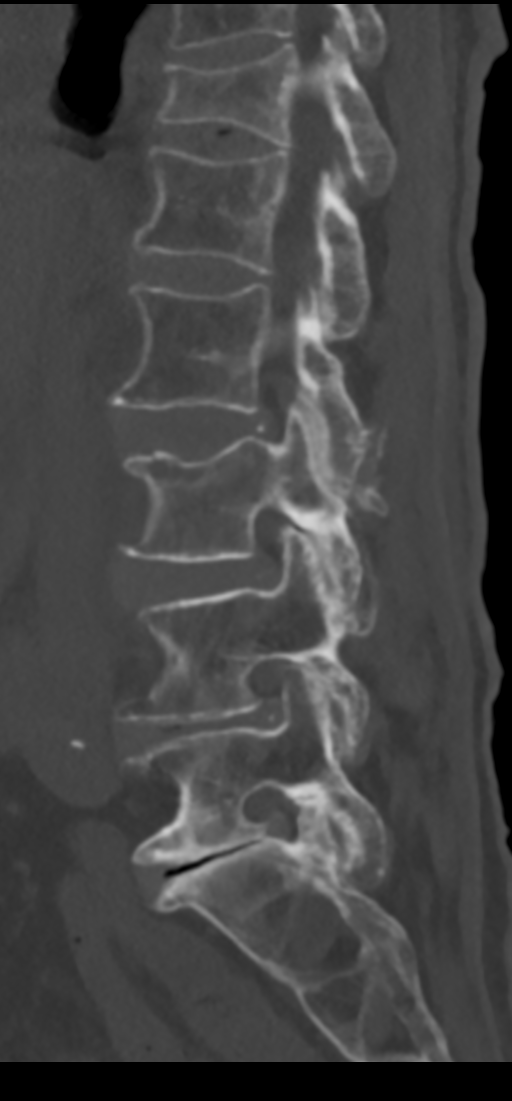
[im 26/61  bone]
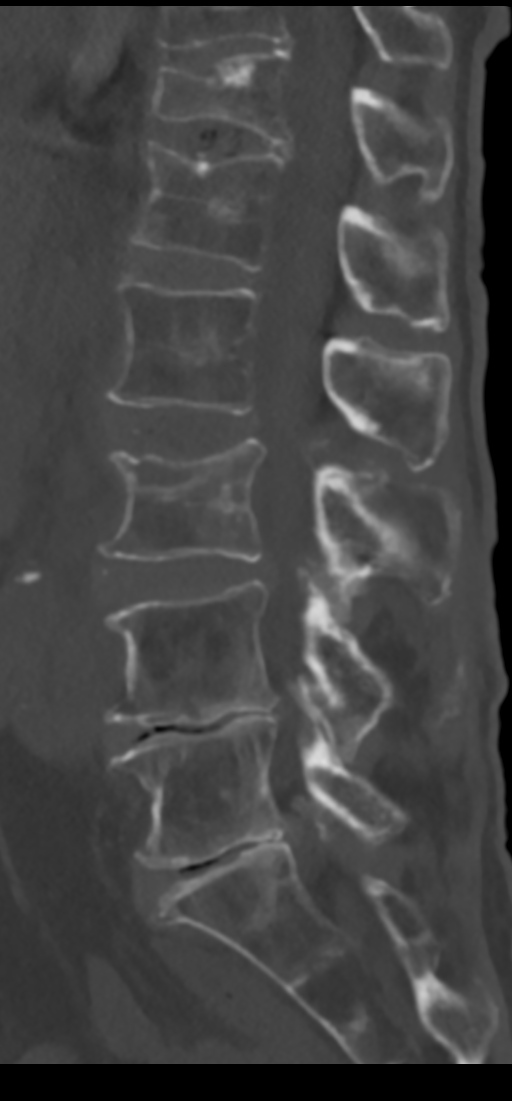
[im 31/61  bone]
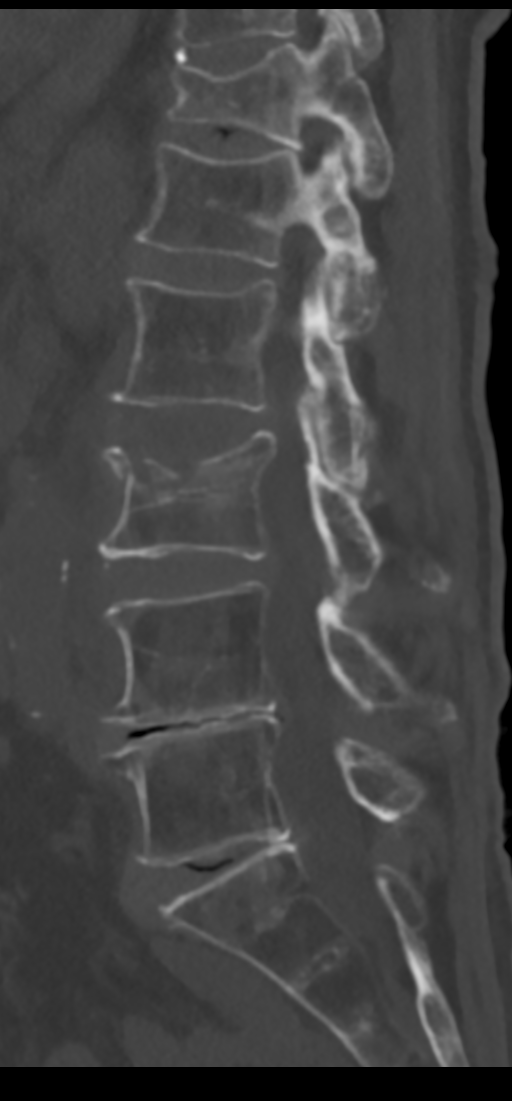
[im 36/61  bone]
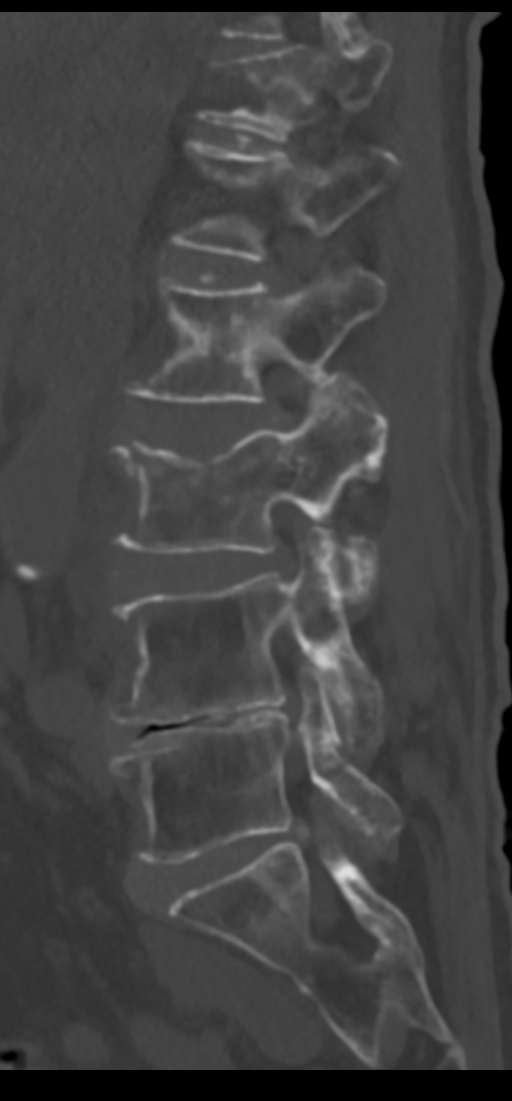
[im 41/61  bone]
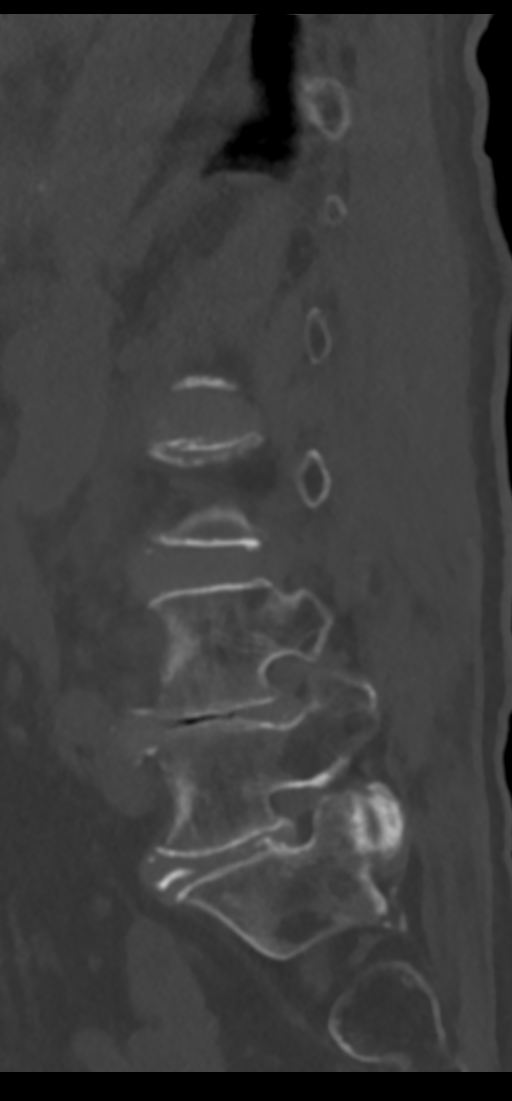

[10 of 33 positions shown; findings below may reference images not displayed]

FINDINGS: Segmentation: Standard. Straightening. Slight anterolisthesis of L2
on L3. Alignment is similar to the prior.

Alignment: Grade 1 anterolisthesis of L2 on L3.

Vertebrae: Acute L3 superior endplate fracture with approximately
40% height loss centrally and approximately 4 mm of bony
retropulsion. There is a discrete lucency through the
anterior/superior endplate and trabecular impaction. Prior T12
compression fracture with approximately 60% height loss, similar to
[DATE]. Diffuse osteopenia.

Paraspinal and other soft tissues: Aorta bi-iliac atherosclerosis.
No evidence of acute abnormality.

Disc levels: Moderate to severe degenerative disc disease at L4-L5
and L5-S1 where there is disc height loss, endplate sclerosis,
vacuum disc phenomenon and disc bulging. Disc bulges at multiple
levels with potentially moderate canal stenosis at L3-L4 and mild to
moderate canal stenosis at L2-L3. Foraminal stenosis is likely
greatest on the right at L5-S1. Severe facet arthropathy at multiple
levels.
IMPRESSION: 1. Acute L3 superior endplate fracture with approximately 40% height
loss centrally and approximately 4 mm of bony retropulsion.
2. Prior T12 compression fracture with approximately 60% height
loss, similar to [DATE].
3. Moderate to severe degenerative disease at L4-L5 and L5-S1 and
multilevel severe facet arthropathy. Potentially moderate canal
stenosis at L3-L4 and potentially mild to moderate canal stenosis at
L2-L3. An MRI of the lumbar spine could better characterize the
canal and foramina if clinically indicated.
4. Diffuse osteopenia.

## 2021-05-31 IMAGING — CT CT HEAD W/O CM
4 series · 16 of 47 positions shown, 18 images · non-contrast
Comparison: CT brain [DATE]

CLINICAL DATA: Unwitnessed fall

EXAM:
CT HEAD WITHOUT CONTRAST
CT CERVICAL SPINE WITHOUT CONTRAST
TECHNIQUE: Multidetector CT imaging of the head and cervical spine was
performed following the standard protocol without intravenous
contrast. Multiplanar CT image reconstructions of the cervical spine
were also generated.

[Series 3: head without · axial · non-contrast · 0.45mm/px · z∈[-93,+27]mm · 7 of 33 slices shown, 9 images]
[im 5/33  brain]
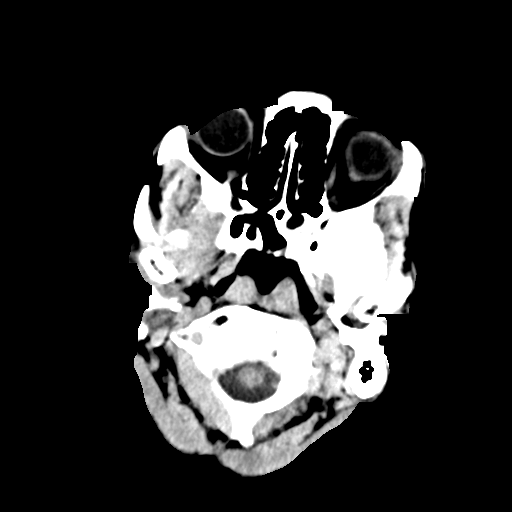
[im 5/33  bone]
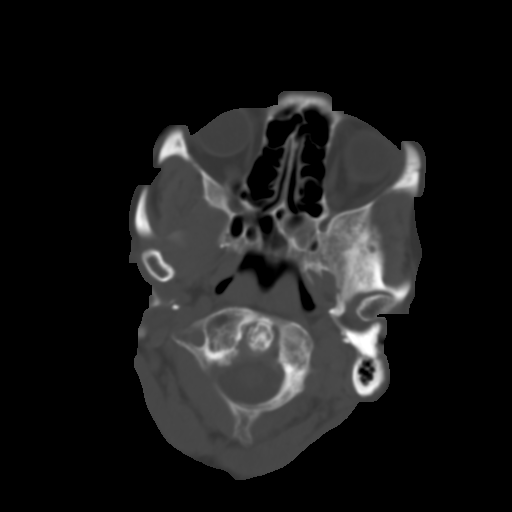
[im 9/33  brain]
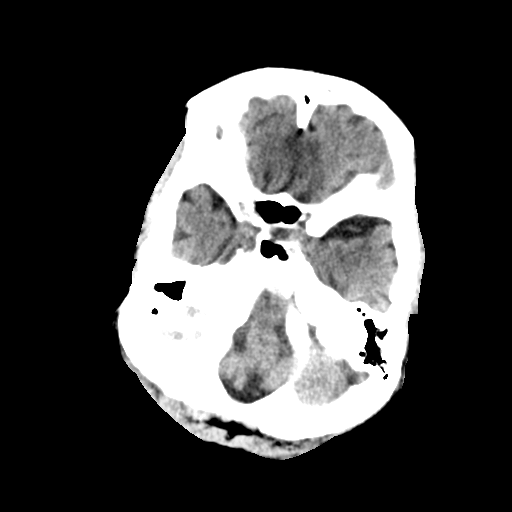
[im 13/33  brain]
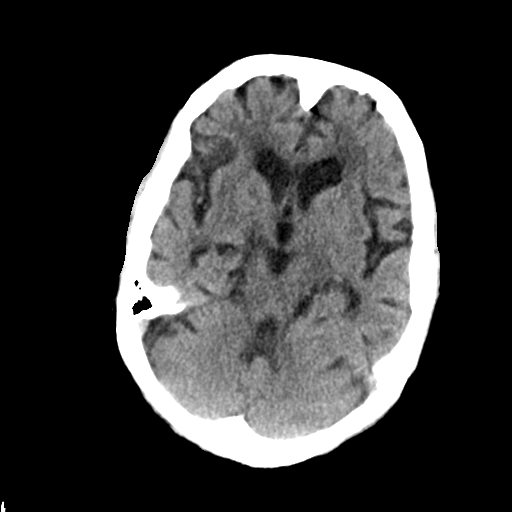
[im 17/33  brain]
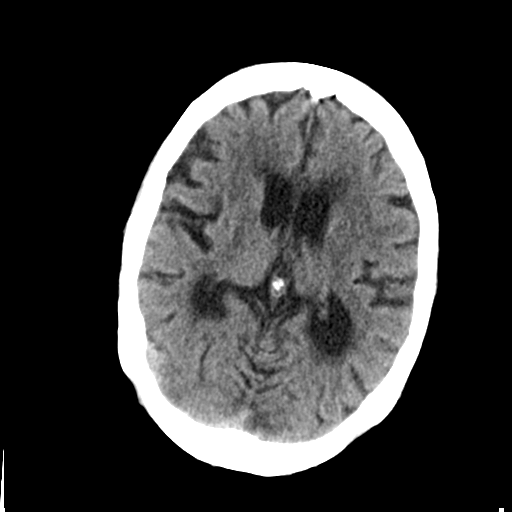
[im 21/33  brain]
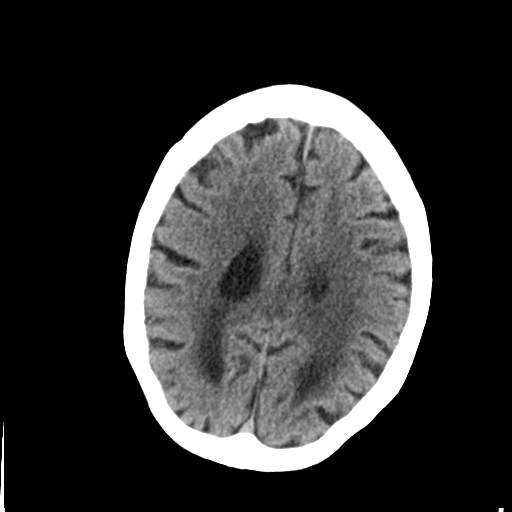
[im 21/33  bone]
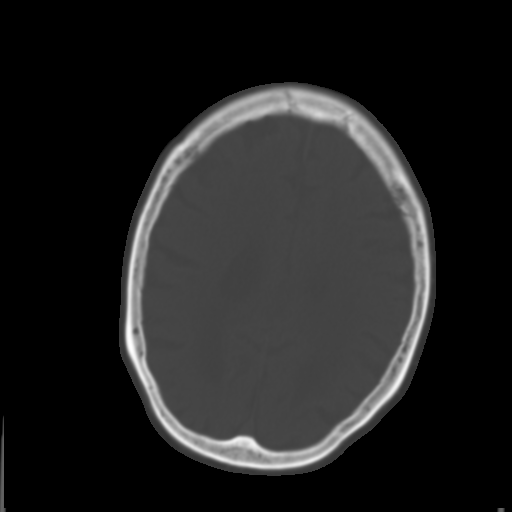
[im 25/33  brain]
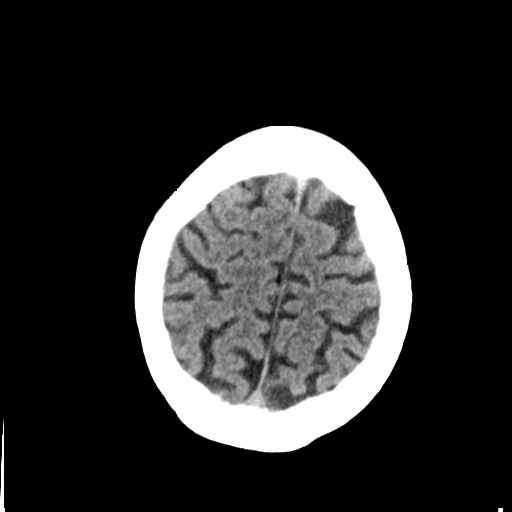
[im 29/33  brain]
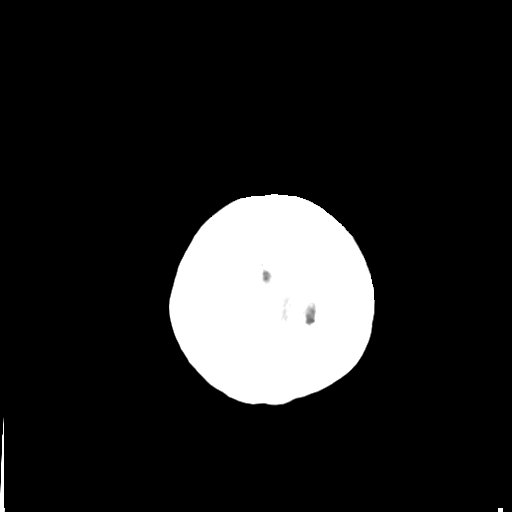

[Series 4: head bone · axial · 0.45mm/px · z∈[-97,-65]mm · 3 of 81 slices shown]
[im 9/81  bone]
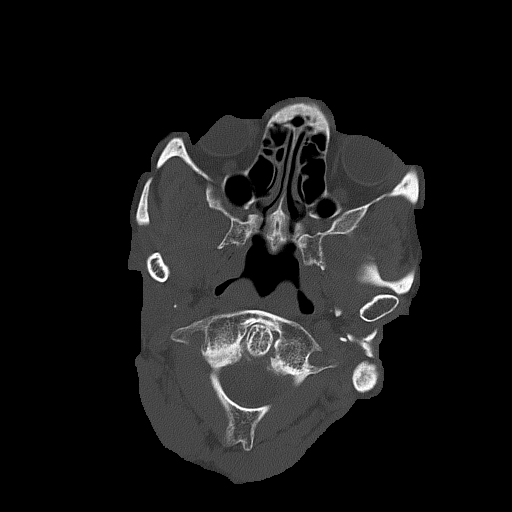
[im 17/81  bone]
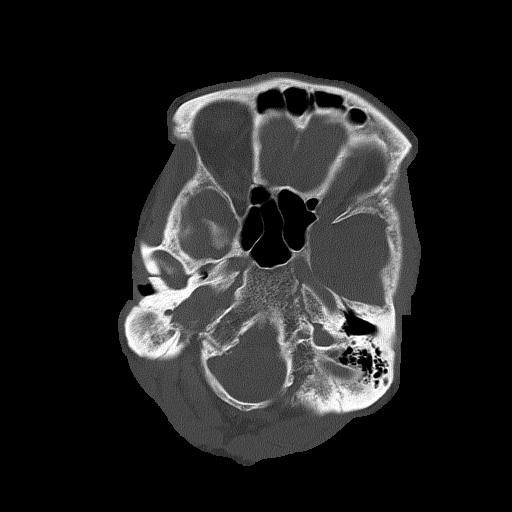
[im 25/81  bone]
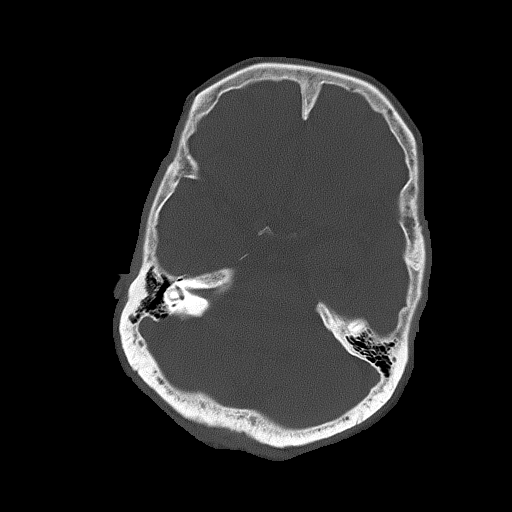

[Series 5: head without cor · coronal · non-contrast · 0.33mm/px · 3 of 70 slices shown]
[im 24/70  brain]
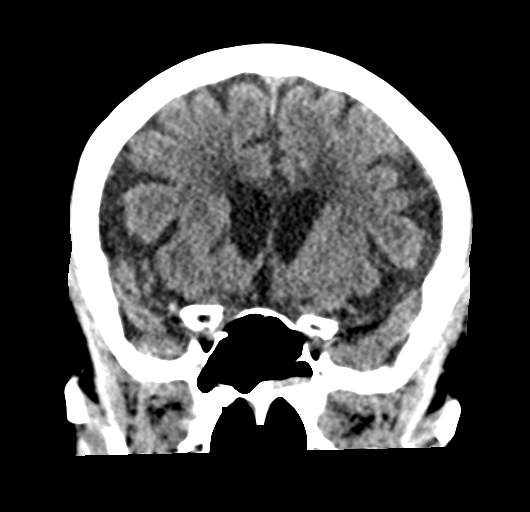
[im 31/70  brain]
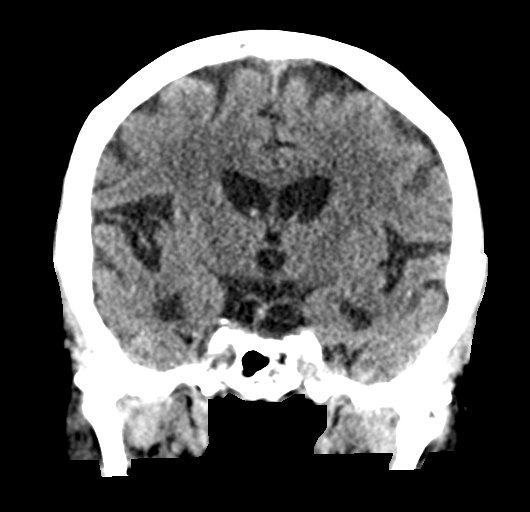
[im 39/70  brain]
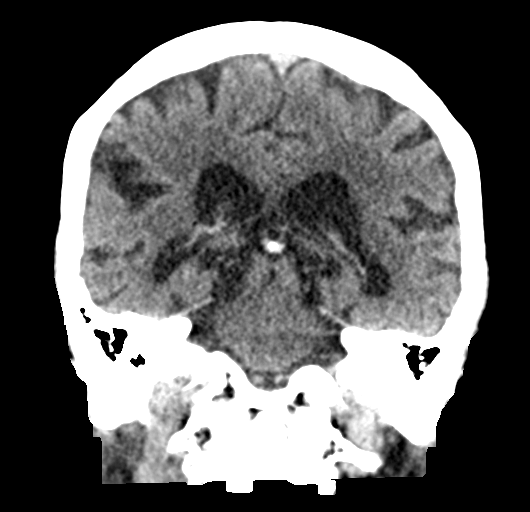

[Series 6: head without sag · sagittal · non-contrast · 0.33mm/px · 3 of 60 slices shown]
[im 20/60  brain]
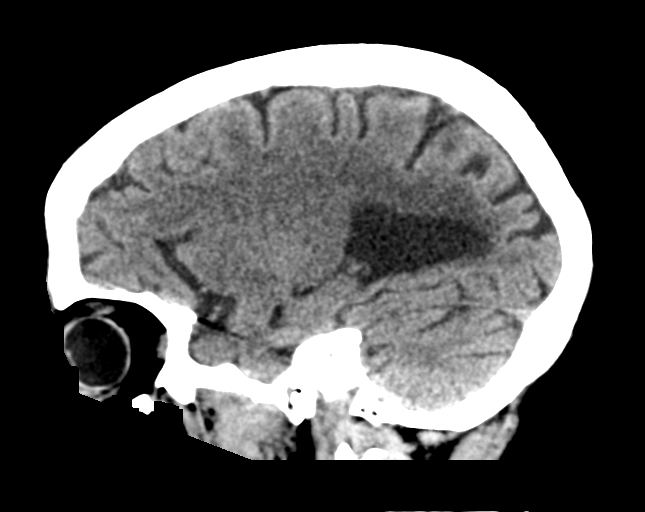
[im 30/60  brain]
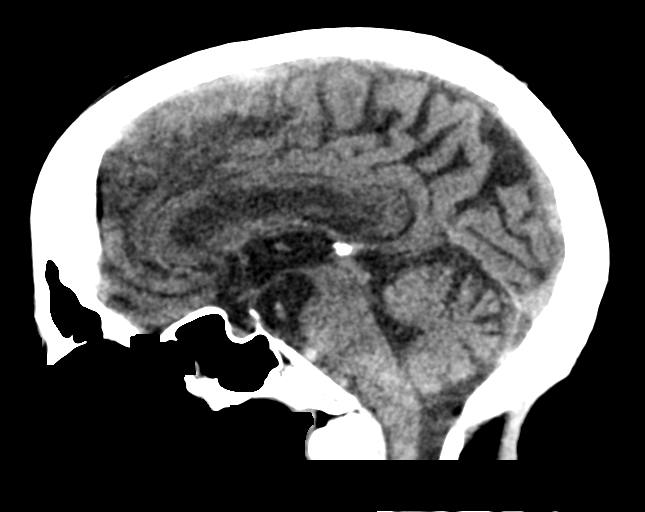
[im 40/60  brain]
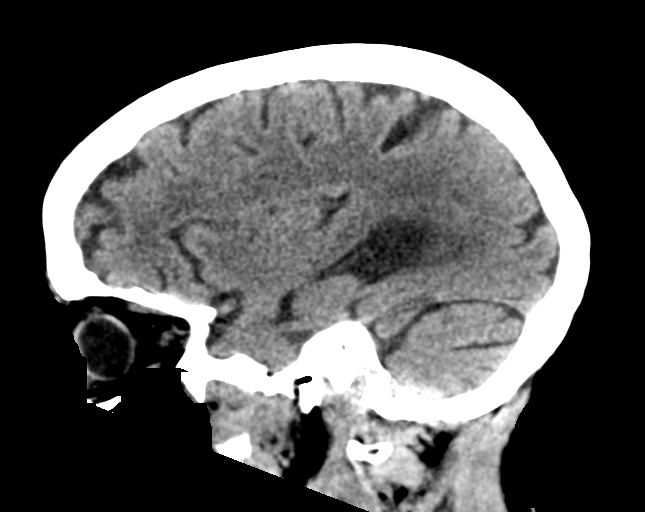

[16 of 47 positions shown; findings below may reference images not displayed]

FINDINGS: CT HEAD FINDINGS

Brain: No acute territorial infarction, hemorrhage or intracranial
mass. Chronic infarct in the right basal ganglia. Atrophy and
chronic small vessel ischemic changes of the white matter

Vascular: No hyperdense vessel.  No unexpected calcification

Skull: Normal. Negative for fracture or focal lesion.

Sinuses/Orbits: No acute finding.

Other: None

CT CERVICAL SPINE FINDINGS

Alignment: No subluxation.  Facet alignment maintained

Skull base and vertebrae: No acute fracture. No primary bone lesion
or focal pathologic process.

Soft tissues and spinal canal: No prevertebral fluid or swelling. No
visible canal hematoma.

Disc levels: Moderate severe diffuse degenerative change C3 through
T1. Facet degenerative changes at multiple levels with foraminal
stenosis.

Upper chest: Negative.

Other: None
IMPRESSION: 1. No CT evidence for acute intracranial abnormality. Atrophy and
chronic small vessel ischemic changes of the white matter
2. Degenerative changes of the cervical spine. No acute osseous
abnormality

## 2021-05-31 NOTE — ED Provider Notes (Signed)
Emergency Medicine Provider Triage Evaluation Note  Alicia Vang , a 87 y.o. female  was evaluated in triage.  Pt complains of weakness, low back pain status post fall that has been ongoing since she fell down 2 steps last Thursday.  She is on Xarelto, reports no loss of consciousness, the fall was unwitnessed.  Daughter at the bedside has read most of the information, patient is not been back to baseline since her fall, she normally performs her ADLs.  She has been nonambulatory since.  Arrived in the ED with a low-grade temp of 99.9 normal bowel movement prior to arrival.  Review of Systems  Positive: Weakness, back pain, fever Negative: Abdominal pain,   Physical Exam  BP (!) 168/100   Pulse 82   Temp 99.9 F (37.7 C)   Resp 15   Ht 5\' 5"  (1.651 m)   Wt 58.1 kg   SpO2 95%   BMI 21.30 kg/m  Gen:   Not at baseline, frail in appearance Resp:  Normal effort  MSK:   Moves extremities without difficulty  Other:    Medical Decision Making  Medically screening exam initiated at 5:07 PM.  Appropriate orders placed.  Alicia Vang was informed that the remainder of the evaluation will be completed by another provider, this initial triage assessment does not replace that evaluation, and the importance of remaining in the ED until their evaluation is complete.  Different mental status since her fall, unknown whether she struck her head.  Patient has been nonambulatory since, prior history of hip replacement.  Also arrived in the ED with a temp of 98.9, will obtain labs and UA to check for any source of infection, along with imaging.   , PA-C 05/31/21 1715    07/31/21, MD 05/31/21 [phone]

## 2021-05-31 NOTE — ED Triage Notes (Signed)
Triage assisted by pt's daughter. She states that last Thursday pt had unwitnessed fall down one stair. Denies LOC/head injury, now complaining of R lower back pain. Daughter said that pt is normally independent with ADLs, but unable to do them since fall.

## 2021-06-01 ENCOUNTER — Observation Stay (HOSPITAL_COMMUNITY): Payer: Medicare PPO

## 2021-06-01 ENCOUNTER — Encounter (HOSPITAL_COMMUNITY): Payer: Self-pay | Admitting: Internal Medicine

## 2021-06-01 DIAGNOSIS — G20 Parkinson's disease: Secondary | ICD-10-CM

## 2021-06-01 DIAGNOSIS — S32030A Wedge compression fracture of third lumbar vertebra, initial encounter for closed fracture: Secondary | ICD-10-CM | POA: Diagnosis not present

## 2021-06-01 DIAGNOSIS — E119 Type 2 diabetes mellitus without complications: Secondary | ICD-10-CM

## 2021-06-01 DIAGNOSIS — G20A1 Parkinson's disease without dyskinesia, without mention of fluctuations: Secondary | ICD-10-CM | POA: Diagnosis present

## 2021-06-01 DIAGNOSIS — I10 Essential (primary) hypertension: Secondary | ICD-10-CM | POA: Diagnosis not present

## 2021-06-01 DIAGNOSIS — S32030G Wedge compression fracture of third lumbar vertebra, subsequent encounter for fracture with delayed healing: Secondary | ICD-10-CM | POA: Diagnosis present

## 2021-06-01 LAB — RESP PANEL BY RT-PCR (FLU A&B, COVID) ARPGX2
Influenza A by PCR: NEGATIVE
Influenza B by PCR: NEGATIVE
SARS Coronavirus 2 by RT PCR: NEGATIVE

## 2021-06-01 LAB — BASIC METABOLIC PANEL
Anion gap: 11 (ref 5–15)
BUN: 22 mg/dL (ref 8–23)
CO2: 28 mmol/L (ref 22–32)
Calcium: 9.4 mg/dL (ref 8.9–10.3)
Chloride: 97 mmol/L — ABNORMAL LOW (ref 98–111)
Creatinine, Ser: 0.96 mg/dL (ref 0.44–1.00)
GFR, Estimated: 56 mL/min — ABNORMAL LOW (ref >60)
Glucose, Bld: 181 mg/dL — ABNORMAL HIGH (ref 70–99)
Potassium: 3.5 mmol/L (ref 3.5–5.1)
Sodium: 136 mmol/L (ref 135–145)

## 2021-06-01 LAB — GLUCOSE, CAPILLARY
Glucose-Capillary: 153 mg/dL — ABNORMAL HIGH (ref 70–99)
Glucose-Capillary: 162 mg/dL — ABNORMAL HIGH (ref 70–99)
Glucose-Capillary: 163 mg/dL — ABNORMAL HIGH (ref 70–99)

## 2021-06-01 LAB — HEMOGLOBIN A1C
Hgb A1c MFr Bld: 7.3 % — ABNORMAL HIGH (ref 4.8–5.6)
Mean Plasma Glucose: 162.81 mg/dL

## 2021-06-01 LAB — CBG MONITORING, ED
Glucose-Capillary: 209 mg/dL — ABNORMAL HIGH (ref 70–99)
Glucose-Capillary: 161 mg/dL — ABNORMAL HIGH (ref 70–99)
Glucose-Capillary: 72 mg/dL (ref 70–99)

## 2021-06-01 IMAGING — MR MR LUMBAR SPINE W/O CM
4 of 5 series · 18 of 48 positions shown · non-contrast
Comparison: Lumbar spine CT yesterday. CT Abdomen and Pelvis
[DATE].

CLINICAL DATA: [AGE] female status post fall last week. Lower
thoracic and lumbar compression fractures on CT yesterday.

EXAM:
MRI LUMBAR SPINE WITHOUT CONTRAST
TECHNIQUE: Multiplanar, multisequence MR imaging of the lumbar spine was
performed. No intravenous contrast was administered.

[Series 2: T2 · sagittal · 4.0mm · 0.55mm/px · 6 of 15 slices shown (1 of 2)]
[im 1/15]
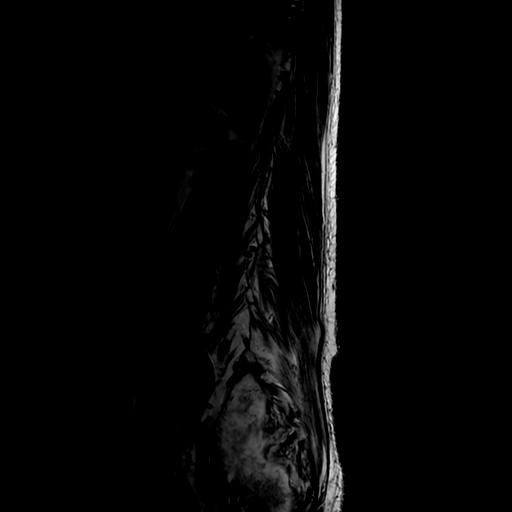
[im 3/15]
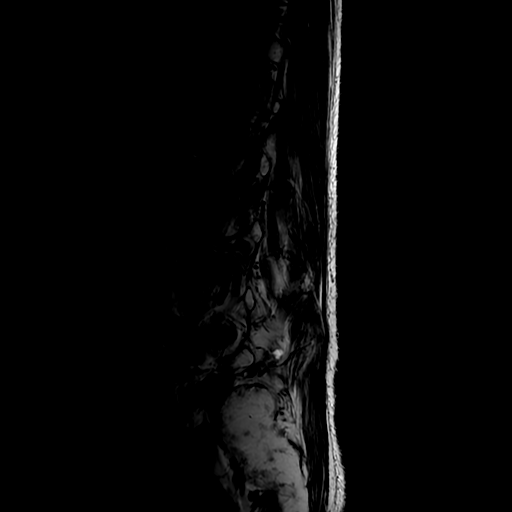
[im 6/15]
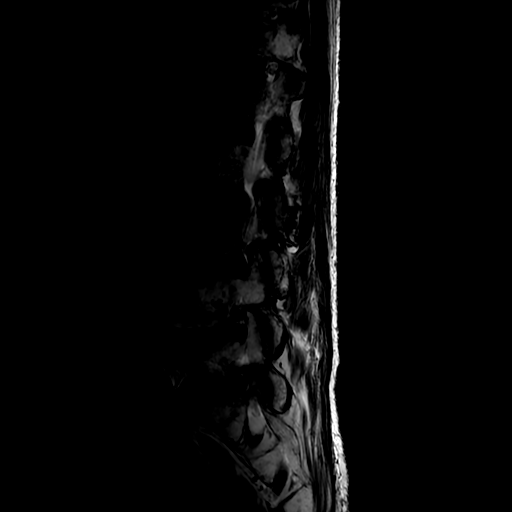
[im 9/15]
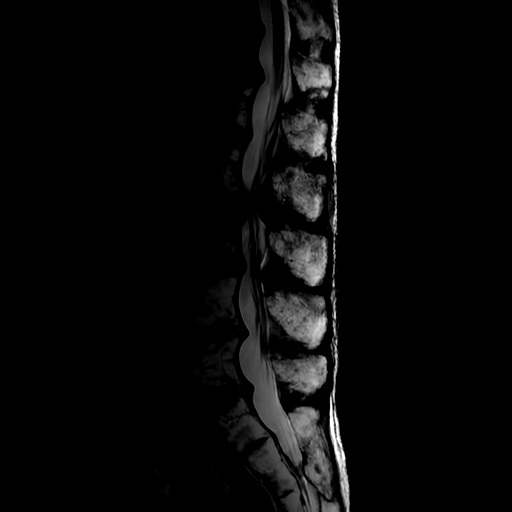
[im 12/15]
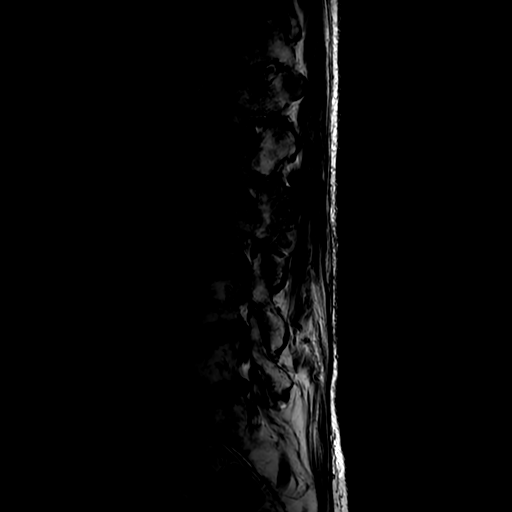
[im 15/15]
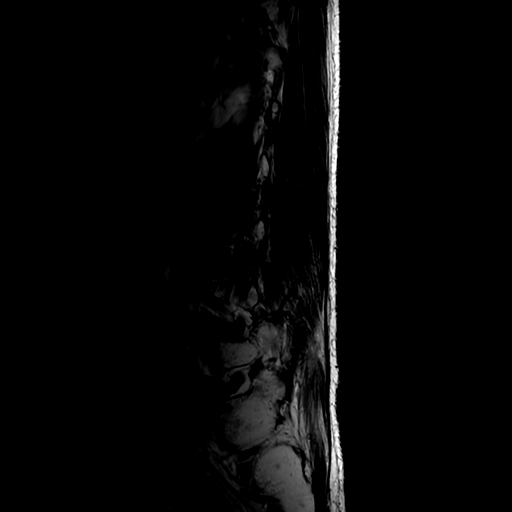

[Series 4: T1 · sagittal · 4.0mm · 0.51mm/px · 3 of 15 slices shown (1 of 2)]
[im 1/15]
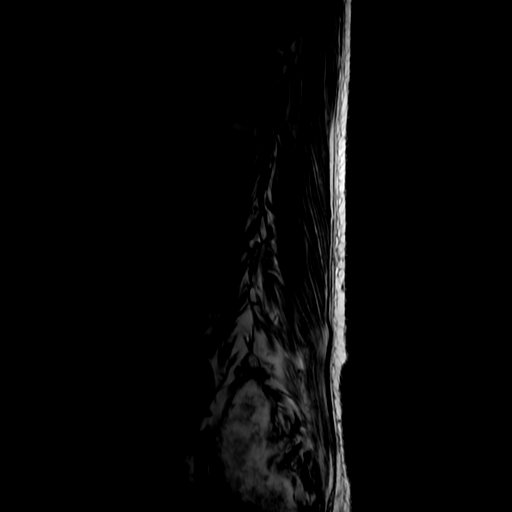
[im 8/15]
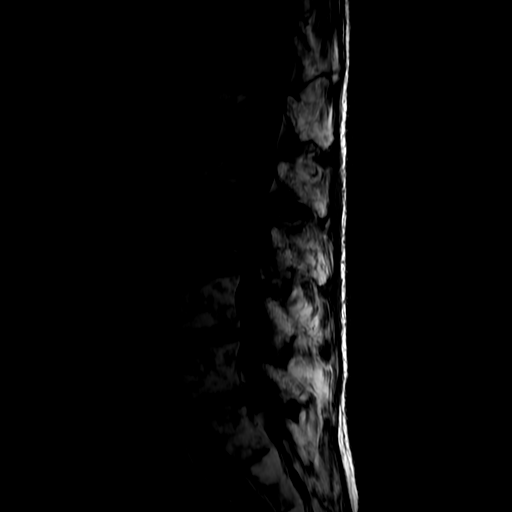
[im 15/15]
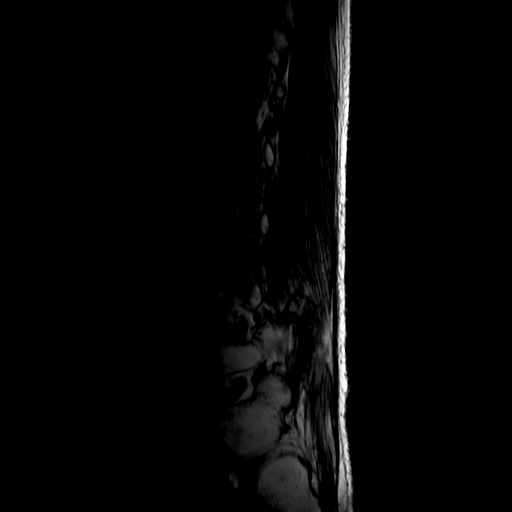

[Series 5: T1 · axial · 4.0mm · 0.39mm/px · z∈[-89,+66]mm · 3 of 43 slices shown (2 of 2)]
[im 6/43]
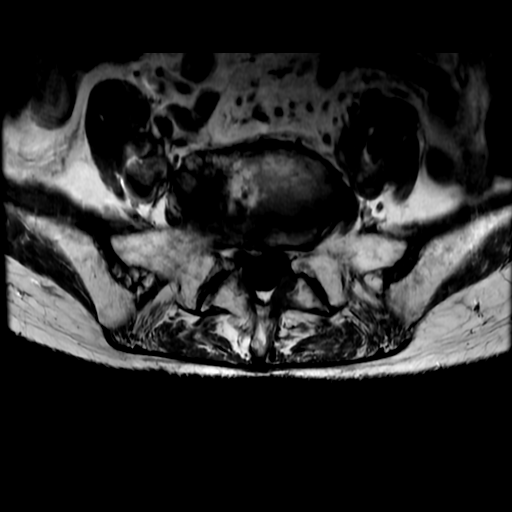
[im 23/43]
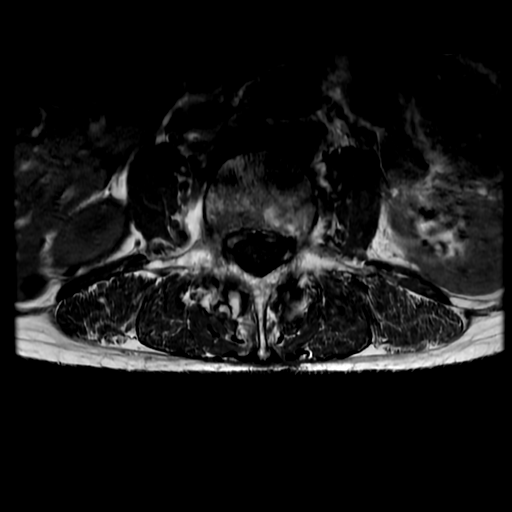
[im 37/43]
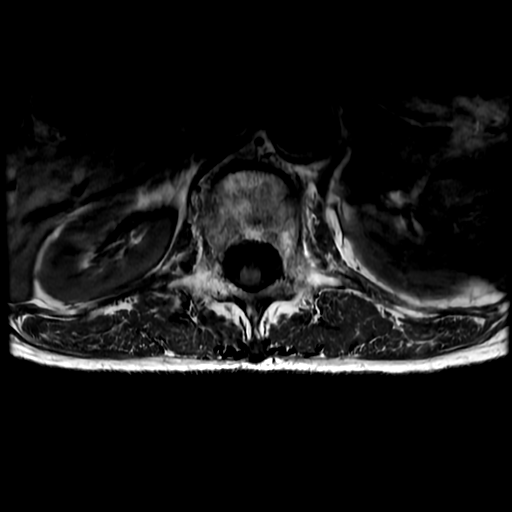

[Series 6: T2 · axial · 4.0mm · 0.39mm/px · z∈[-104,+66]mm · 6 of 43 slices shown (2 of 2)]
[im 3/43]
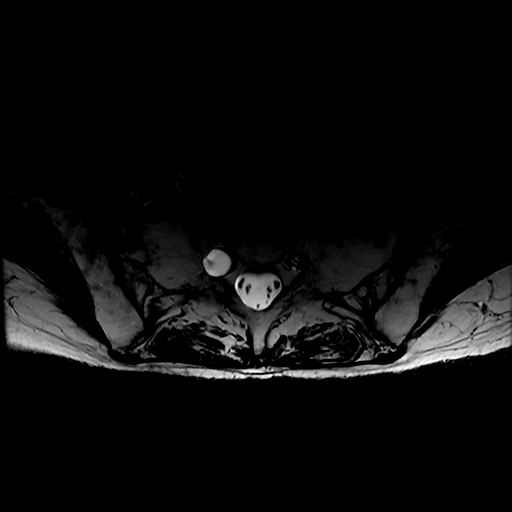
[im 6/43]
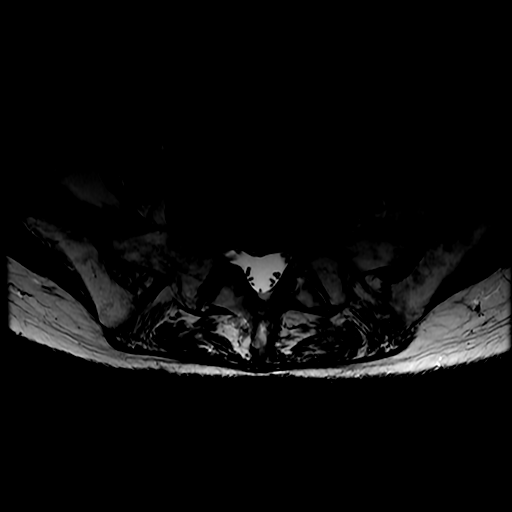
[im 9/43]
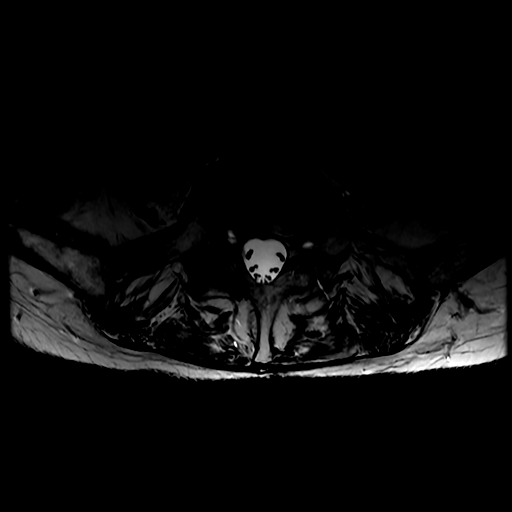
[im 15/43]
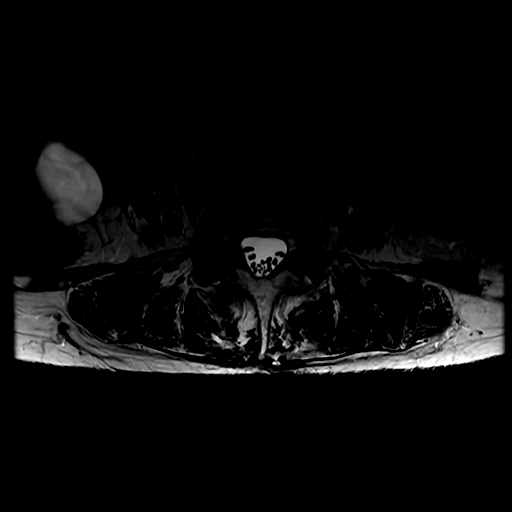
[im 23/43]
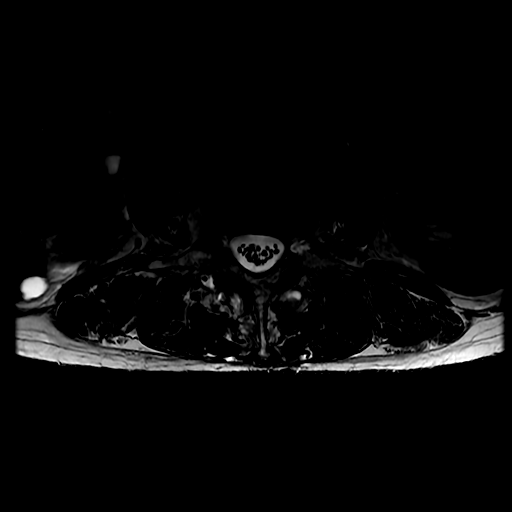
[im 37/43]
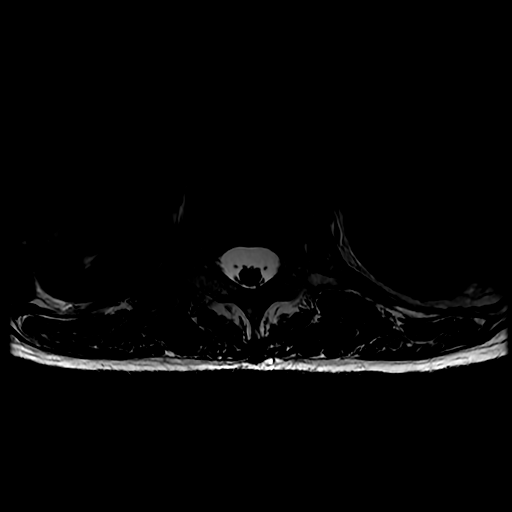

[18 of 48 positions shown; findings below may reference images not displayed]

FINDINGS: Segmentation:  Normal on the comparisons.

Alignment: Stable straightening of lumbar lordosis. Mild
anterolisthesis of L2 on L3 is stable since yesterday and measures
2-3 mm.

Vertebrae: Moderate chronic T12 and mild L1 compression fractures
appear stable since the MERE CT and are without convincing marrow
edema.

L3 superior endplate compression with up to 40% loss of central
vertebral body height is associated with fracture fragments and
marrow edema. Small volume of frank hematoma suspected in the
anterior superior endplate. Heterogeneity in the adjacent L2-L3 disc
is likely posttraumatic. As demonstrated yesterday the L3 pedicles
and posterior elements appear to remain intact.

L2, L4, L5, and the visible sacrum remain intact.

Conus medullaris and cauda equina: Conus extends to the L2-L3 level,
see details below. No signal abnormality in the lower thoracic
spinal cord, with capacious spinal canal at most levels.

Paraspinal and other soft tissues: Mild medial psoas muscle edema
centered at L3. No paraspinal muscle fluid collection. Benign
appearing hepatic cysts, and other stable visible abdominal viscera.

Disc levels:

No lower thoracic spinal stenosis through L2.

L2-L3: Circumferential disc bulge with mild retropulsion of the L3
posterosuperior endplate and mild to moderate posterior element
hypertrophy combine for mild to moderate spinal stenosis at the
level of the conus (series 6, image 17 and series 2, image 8). Up to
mild conus mass effect but no conus signal abnormality. There is
moderate associated L2 neural foraminal stenosis greater on the
left.

L3-L4: Degenerative appearing mild spinal stenosis with
circumferential disc bulge, mild to moderate posterior element
hypertrophy. Moderate bilateral L3 foraminal stenosis.

No significant spinal stenosis in the lower lumbar spine.
IMPRESSION: 1. Acute to subacute L3 compression fracture with up to 40% loss of
central vertebral body height. Mild anterolisthesis of L2 on L3 and
mild retropulsion of the posterosuperior endplate contribute to
moderate spinal stenosis at the level of the conus medullaris. Up to
mild conus mass effect but no conus or lower thoracic spinal cord
signal abnormality. Moderate L2 foraminal stenosis.

2. No other acute osseous abnormality in the lumbar spine. Chronic
T12 and L1 compression fractures.

3. Mild degenerative spinal stenosis at L3-L4 with moderate
bilateral L3 foraminal stenosis.

## 2021-06-01 MED ORDER — MORPHINE SULFATE (PF) 2 MG/ML IV SOLN: 1.0000 mg | INTRAVENOUS | Status: DC | PRN

## 2021-06-01 MED ORDER — MORPHINE SULFATE (PF) 2 MG/ML IV SOLN: 2.0000 mg | INTRAVENOUS | Status: DC | PRN

## 2021-06-01 MED ORDER — POLYETHYLENE GLYCOL 3350 17 G PO PACK
17.0000 g | PACK | Freq: Two times a day (BID) | ORAL | Status: DC
  Administered 2021-06-01 – 2021-06-06 (×10): 17 g via ORAL
  Filled 2021-06-01 (×10): qty 1

## 2021-06-01 MED ORDER — LACTATED RINGERS IV SOLN: INTRAVENOUS | Status: DC

## 2021-06-01 MED ORDER — ONDANSETRON HCL 4 MG/2ML IJ SOLN: 4.0000 mg | Freq: Four times a day (QID) | INTRAMUSCULAR | Status: DC | PRN

## 2021-06-01 MED ORDER — ONDANSETRON HCL 4 MG PO TABS: 4.0000 mg | ORAL_TABLET | Freq: Four times a day (QID) | ORAL | Status: DC | PRN

## 2021-06-01 MED ORDER — LIDOCAINE 5 % EX PTCH
1.0000 | MEDICATED_PATCH | CUTANEOUS | Status: DC
  Administered 2021-06-01 – 2021-06-05 (×5): 1 via TRANSDERMAL
  Filled 2021-06-01 (×5): qty 1

## 2021-06-01 MED ORDER — ACETAMINOPHEN 325 MG PO TABS: 650.0000 mg | ORAL_TABLET | Freq: Four times a day (QID) | ORAL | Status: DC | PRN

## 2021-06-01 MED ORDER — OXYCODONE HCL 5 MG PO TABS: 2.5000 mg | ORAL_TABLET | ORAL | Status: DC | PRN

## 2021-06-01 MED ORDER — OXYCODONE HCL 5 MG PO TABS
5.0000 mg | ORAL_TABLET | ORAL | Status: DC | PRN
  Administered 2021-06-03: 5 mg via ORAL
  Filled 2021-06-01: qty 1

## 2021-06-01 MED ORDER — LIFITEGRAST 5 % OP SOLN: 1.0000 [drp] | Freq: Every day | OPHTHALMIC | Status: DC

## 2021-06-01 MED ORDER — ACETAMINOPHEN 650 MG RE SUPP: 650.0000 mg | Freq: Four times a day (QID) | RECTAL | Status: DC | PRN

## 2021-06-01 MED ORDER — OXYCODONE HCL 5 MG PO TABS
2.5000 mg | ORAL_TABLET | ORAL | Status: DC | PRN
Start: 2021-06-01 — End: 2021-06-07

## 2021-06-01 MED ORDER — POTASSIUM CHLORIDE CRYS ER 20 MEQ PO TBCR
40.0000 meq | EXTENDED_RELEASE_TABLET | Freq: Once | ORAL | Status: AC
  Administered 2021-06-01: 40 meq via ORAL
  Filled 2021-06-01: qty 2

## 2021-06-01 MED ORDER — LORAZEPAM 2 MG/ML IJ SOLN
0.5000 mg | Freq: Once | INTRAMUSCULAR | Status: AC | PRN
  Administered 2021-06-01: 0.5 mg via INTRAVENOUS
  Filled 2021-06-01: qty 1

## 2021-06-01 MED ORDER — METOPROLOL SUCCINATE ER 50 MG PO TB24
50.0000 mg | ORAL_TABLET | Freq: Every day | ORAL | Status: DC
  Administered 2021-06-02 – 2021-06-06 (×5): 50 mg via ORAL
  Filled 2021-06-01 (×5): qty 1

## 2021-06-01 MED ORDER — ACETAMINOPHEN 500 MG PO TABS
1000.0000 mg | ORAL_TABLET | Freq: Three times a day (TID) | ORAL | Status: AC
  Administered 2021-06-01 – 2021-06-04 (×8): 1000 mg via ORAL
  Filled 2021-06-01 (×8): qty 2

## 2021-06-01 MED ORDER — ONDANSETRON HCL 4 MG/2ML IJ SOLN
4.0000 mg | Freq: Once | INTRAMUSCULAR | Status: AC
  Administered 2021-06-01: 4 mg via INTRAVENOUS
  Filled 2021-06-01: qty 2

## 2021-06-01 MED ORDER — INSULIN ASPART 100 UNIT/ML IJ SOLN
0.0000 [IU] | INTRAMUSCULAR | Status: DC
  Administered 2021-06-01 (×2): 2 [IU] via SUBCUTANEOUS
  Administered 2021-06-01: 3 [IU] via SUBCUTANEOUS
  Administered 2021-06-02: 2 [IU] via SUBCUTANEOUS
  Administered 2021-06-02: 1 [IU] via SUBCUTANEOUS
  Administered 2021-06-02 (×2): 2 [IU] via SUBCUTANEOUS
  Administered 2021-06-02: 3 [IU] via SUBCUTANEOUS
  Administered 2021-06-03 (×2): 2 [IU] via SUBCUTANEOUS
  Administered 2021-06-03 (×3): 1 [IU] via SUBCUTANEOUS
  Administered 2021-06-04: 2 [IU] via SUBCUTANEOUS
  Administered 2021-06-04 (×3): 1 [IU] via SUBCUTANEOUS
  Administered 2021-06-05 (×3): 2 [IU] via SUBCUTANEOUS
  Administered 2021-06-05 – 2021-06-06 (×6): 1 [IU] via SUBCUTANEOUS
  Administered 2021-06-06: 2 [IU] via SUBCUTANEOUS
  Administered 2021-06-06: 1 [IU] via SUBCUTANEOUS

## 2021-06-01 MED ORDER — MORPHINE SULFATE (PF) 2 MG/ML IV SOLN
2.0000 mg | Freq: Once | INTRAVENOUS | Status: AC
  Administered 2021-06-01: 2 mg via INTRAVENOUS
  Filled 2021-06-01: qty 1

## 2021-06-01 MED ORDER — BRINZOLAMIDE 1 % OP SUSP
1.0000 [drp] | Freq: Every day | OPHTHALMIC | Status: DC
  Administered 2021-06-01 – 2021-06-05 (×5): 1 [drp] via OPHTHALMIC
  Filled 2021-06-01: qty 10

## 2021-06-01 MED ORDER — LATANOPROST 0.005 % OP SOLN
1.0000 [drp] | Freq: Every day | OPHTHALMIC | Status: DC
  Administered 2021-06-01 – 2021-06-05 (×5): 1 [drp] via OPHTHALMIC
  Filled 2021-06-01: qty 2.5

## 2021-06-01 MED ORDER — ACETAMINOPHEN 325 MG PO TABS
650.0000 mg | ORAL_TABLET | Freq: Four times a day (QID) | ORAL | Status: DC | PRN
  Filled 2021-06-01: qty 2

## 2021-06-01 NOTE — Progress Notes (Signed)
PT Cancellation Note  Patient Details Name: Alicia Vang MRN: [id number] DOB: 09-01-1927   Cancelled Treatment:    Reason Eval/Treat Not Completed: Patient not medically ready Pt scheduled for MRI of back and awaiting neurosurgery consult due to lumbar fracture. Will hold until pt medically cleared and follow up as schedule allows.   Farley Ly, PT, DPT  Acute Rehabilitation Services  Pager: [phone] Office: [phone]    Lehman Prom 06/01/2021, 10:14 AM

## 2021-06-01 NOTE — TOC CAGE-AID Note (Signed)
Transition of Care [facility]) - CAGE-AID Screening   Patient Details  Name: Alicia Vang MRN: [id number] Date of Birth: 19-Apr-1927  Transition of Care [facility]) CM/SW Contact:    Cheyna Retana C Tarpley-Carter, LCSWA Phone Number: 06/01/2021, 2:53 PM   Clinical Narrative: Pt participated in Cage-Aid.  Pt stated she does not use substance or ETOH.  Pt was not offered resources, due to no usage of substance or ETOH.    Elvyn Krohn Tarpley-Carter, MSW, LCSW-A Pronouns:  She/Her/Hers [facility]itions of Care Clinical Social Worker Direct Number:  [phone] [email or web address]   CAGE-AID Screening:    Have You Ever Felt You Ought to Cut Down on Your Drinking or Drug Use?: No Have People Annoyed You By [employer] Your Drinking Or Drug Use?: No Have You Felt Bad Or Guilty About Your Drinking Or Drug Use?: No Have You Ever Had a Drink or Used Drugs First Thing In The Morning to Steady Your Nerves or to Get Rid of a Hangover?: No CAGE-AID Score: 0  Substance Abuse Education Offered: No

## 2021-06-01 NOTE — ED Provider Notes (Signed)
[facility] EMERGENCY DEPARTMENT Provider Note   CSN: [id number] Arrival date & time: 05/31/21  1643     History Chief Complaint  Patient presents with   Back Pain   Fall    Alicia Vang is a 87 y.o. female.  Patient presents to the emergency department for evaluation of back pain.  Patient reports that she fell while trying to walk down steps several days ago.  Since that time she has been nonambulatory.  Patient complaining of severe pain in the lower back.      Past Medical History:  Diagnosis Date   Arthritis    Diabetes mellitus without complication (HCC)    Type II   Hypertension    Hypokalemia     Patient Active Problem List   Diagnosis Date Noted   COVID-19 virus infection 10/21/2020   Bradycardia 10/21/2020   Type 2 diabetes mellitus (HCC) 10/21/2020   Primary osteoarthritis of left hip 10/26/2017   Osteoarthritis of left hip 08/17/2017    Past Surgical History:  Procedure Laterality Date   ABDOMINAL HYSTERECTOMY     CHALAZION EXCISION  11/06/2011   Procedure: MINOR EXCISION OF CHALAZION;  Surgeon: Vita Erm.;  Location: [facility];  Service: Ophthalmology;  Laterality: Left;   CHALAZION EXCISION  02/02/2012   Procedure: MINOR EXCISION OF CHALAZION;  Surgeon: Vita Erm., MD;  Location: [facility];  Service: Ophthalmology;  Laterality: Left;  left eye upper lid   CHALAZION EXCISION Right 03/28/2013   Procedure: MINOR EXCISION OF CHALAZION upper and lower right eye ;  Surgeon: Vita Erm., MD;  Location: [facility];  Service: Ophthalmology;  Laterality: Right;   CHALAZION EXCISION Right 03/28/2013   Procedure: MINOR EXCISION OF CHALAZION UPPER AND LOWER RIGHT EYE  ;  Surgeon: Vita Erm., MD;  Location: [facility] OR;  Service: Ophthalmology;  Laterality: Right;   COLONOSCOPY     TOTAL HIP ARTHROPLASTY Left 10/26/2017   Procedure: TOTAL HIP ARTHROPLASTY  ANTERIOR APPROACH;  Surgeon: Gean Birchwood, MD;  Location: [facility] OR;  Service: Orthopedics;  Laterality: Left;     OB History   No obstetric history on file.     No family history on file.  Social History   Tobacco Use   Smoking status: Never   Smokeless tobacco: Never  Vaping Use   Vaping Use: Never used  Substance Use Topics   Alcohol use: No   Drug use: No    Home Medications Prior to Admission medications   Medication Sig Start Date End Date Taking? Authorizing Provider  acetaminophen (TYLENOL) 325 MG tablet Take 2 tablets (650 mg total) by mouth every 6 (six) hours as needed for mild pain or headache (fever >/= 101). 10/24/20   Lonia Blood, MD  brinzolamide (AZOPT) 1 % ophthalmic suspension Place 1 drop into both eyes at bedtime. 03/10/21   [provider]  cephALEXin (KEFLEX) 500 MG capsule Take 1 capsule (500 mg total) by mouth 4 (four) times daily. 03/24/21   Jacalyn Lefevre, MD  chlorthalidone (HYGROTON) 25 MG tablet Take 25 mg by mouth daily as needed (feet swelling). 10/27/20   [provider]  glimepiride (AMARYL) 2 MG tablet Take 4 mg by mouth daily with breakfast.     [provider]  latanoprost (XALATAN) 0.005 % ophthalmic solution Place 1 drop into both eyes at bedtime.    [provider]  losartan (COZAAR) 100 MG tablet  Take 100 mg by mouth daily.    [provider]  Metoprolol Succinate 50 MG CS24 Take 50 mg by mouth daily.     [provider]  Potassium Chloride ER 20 MEQ TBCR Take 20 mEq by mouth daily as needed (feet swelling). 10/26/20   [provider]  XARELTO 15 MG TABS tablet Take 15 mg by mouth daily. 03/17/21   [provider]  XIIDRA 5 % SOLN Place 1 drop into both eyes in the morning and at bedtime. 07/26/20   [provider]    Allergies    Amlodipine  Review of Systems   Review of Systems  Musculoskeletal:  Positive for back pain.  All other systems reviewed and are  negative.  Physical Exam Updated Vital Signs BP 120/73   Pulse (!) 53   Temp 99.9 F (37.7 C)   Resp 20   Ht 5\' 5"  (1.651 m)   Wt 58.1 kg   SpO2 97%   BMI 21.30 kg/m   Physical Exam Vitals and nursing note reviewed.  Constitutional:      General: She is not in acute distress.    Appearance: Normal appearance. She is well-developed.  HENT:     Head: Normocephalic and atraumatic.     Right Ear: Hearing normal.     Left Ear: Hearing normal.     Nose: Nose normal.  Eyes:     Conjunctiva/sclera: Conjunctivae normal.     Pupils: Pupils are equal, round, and reactive to light.  Cardiovascular:     Rate and Rhythm: Regular rhythm.     Heart sounds: S1 normal and S2 normal. No murmur heard.   No friction rub. No gallop.  Pulmonary:     Effort: Pulmonary effort is normal. No respiratory distress.     Breath sounds: Normal breath sounds.  Chest:     Chest wall: No tenderness.  Abdominal:     General: Bowel sounds are normal.     Palpations: Abdomen is soft.     Tenderness: There is no abdominal tenderness. There is no guarding or rebound. Negative signs include Murphy's sign and McBurney's sign.     Hernia: No hernia is present.  Musculoskeletal:        General: Normal range of motion.     Cervical back: Normal range of motion and neck supple.     Thoracic back: Normal.     Lumbar back: Tenderness and bony tenderness present.  Skin:    General: Skin is warm and dry.     Findings: No rash.  Neurological:     Mental Status: She is alert and oriented to person, place, and time.     GCS: GCS eye subscore is 4. GCS verbal subscore is 5. GCS motor subscore is 6.     Cranial Nerves: No cranial nerve deficit.     Sensory: No sensory deficit.     Coordination: Coordination normal.  Psychiatric:        Speech: Speech normal.        Behavior: Behavior normal.        Thought Content: Thought content normal.    ED Results / Procedures / Treatments   Labs (all labs ordered are  listed, but only abnormal results are displayed) Labs Reviewed  CBC WITH DIFFERENTIAL/PLATELET - Abnormal; Notable for the following components:      Result Value   Hemoglobin 11.8 (*)    Lymphs Abs 0.6 (*)    All other components within  normal limits  COMPREHENSIVE METABOLIC PANEL - Abnormal; Notable for the following components:   Sodium 134 (*)    Potassium 2.9 (*)    Chloride 94 (*)    Glucose, Bld 300 (*)    BUN 33 (*)    Creatinine, Ser 1.15 (*)    GFR, Estimated 45 (*)    All other components within normal limits  URINALYSIS, ROUTINE W REFLEX MICROSCOPIC - Abnormal; Notable for the following components:   APPearance HAZY (*)    Glucose, UA 150 (*)    Hgb urine dipstick SMALL (*)    Protein, ur 100 (*)    All other components within normal limits  URINE CULTURE  PROTIME-INR    EKG None  Radiology CT HEAD WO CONTRAST ( )  Result Date: 05/31/2021 CLINICAL DATA:  Unwitnessed fall EXAM: CT HEAD WITHOUT CONTRAST CT CERVICAL SPINE WITHOUT CONTRAST TECHNIQUE: Multidetector CT imaging of the head and cervical spine was performed following the standard protocol without intravenous contrast. Multiplanar CT image reconstructions of the cervical spine were also generated. COMPARISON:  CT brain 03/24/2021 FINDINGS: CT HEAD FINDINGS Brain: No acute territorial infarction, hemorrhage or intracranial mass. 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 Atrophy and chronic small vessel ischemic changes of the white matter Vascular: No hyperdense vessel.  No unexpected calcification Skull: Normal. Negative for fracture or focal lesion. Sinuses/Orbits: No acute finding. Other: None CT CERVICAL SPINE FINDINGS Alignment: No subluxation.  Facet alignment maintained Skull base and vertebrae: No acute fracture. No primary bone lesion or focal pathologic process. Soft tissues and spinal canal: No prevertebral fluid or swelling. No visible canal hematoma. Disc levels: Moderate severe diffuse degenerative  change C3 through T1. Facet degenerative changes at multiple levels with foraminal stenosis. Upper chest: Negative. Other: None IMPRESSION: 1. No CT evidence for acute intracranial abnormality. Atrophy and chronic small vessel ischemic changes of the white matter 2. Degenerative changes of the cervical spine. No acute osseous abnormality Electronically Signed   By: Jasmine Pang M.D.   On: 05/31/2021 19:11   CT Cervical Spine Wo Contrast  Result Date: 05/31/2021 CLINICAL DATA:  Unwitnessed fall EXAM: CT HEAD WITHOUT CONTRAST CT CERVICAL SPINE WITHOUT CONTRAST TECHNIQUE: Multidetector CT imaging of the head and cervical spine was performed following the standard protocol without intravenous contrast. Multiplanar CT image reconstructions of the cervical spine were also generated. COMPARISON:  CT brain 03/24/2021 FINDINGS: CT HEAD FINDINGS Brain: No acute territorial infarction, hemorrhage or intracranial mass. 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 Atrophy and chronic small vessel ischemic changes of the white matter Vascular: No hyperdense vessel.  No unexpected calcification Skull: Normal. Negative for fracture or focal lesion. Sinuses/Orbits: No acute finding. Other: None CT CERVICAL SPINE FINDINGS Alignment: No subluxation.  Facet alignment maintained Skull base and vertebrae: No acute fracture. No primary bone lesion or focal pathologic process. Soft tissues and spinal canal: No prevertebral fluid or swelling. No visible canal hematoma. Disc levels: Moderate severe diffuse degenerative change C3 through T1. Facet degenerative changes at multiple levels with foraminal stenosis. Upper chest: Negative. Other: None IMPRESSION: 1. No CT evidence for acute intracranial abnormality. Atrophy and chronic small vessel ischemic changes of the white matter 2. Degenerative changes of the cervical spine. No acute osseous abnormality Electronically Signed   By: Jasmine Pang M.D.   On: 05/31/2021 19:11   CT Lumbar  Spine Wo Contrast  Result Date: 05/31/2021 CLINICAL DATA:  Back trauma, no prior imaging (Age >= 16y) EXAM: CT LUMBAR SPINE WITHOUT  CONTRAST TECHNIQUE: Multidetector CT imaging of the lumbar spine was performed without intravenous contrast administration. Multiplanar CT image reconstructions were also generated. COMPARISON:  CT renal March 24, 2021. FINDINGS: Segmentation: Standard. Straightening. Slight anterolisthesis of L2 on L3. Alignment is similar to the prior. Alignment: Grade 1 anterolisthesis of L2 on L3. Vertebrae: Acute L3 superior endplate fracture with approximately 40% height loss centrally and approximately 4 mm of bony retropulsion. There is a discrete lucency through the anterior/superior endplate and trabecular impaction. Prior T12 compression fracture with approximately 60% height loss, similar to March 24, 2021. Diffuse osteopenia. Paraspinal and other soft tissues: Aorta bi-iliac atherosclerosis. No evidence of acute abnormality. Disc levels: Moderate to severe degenerative disc disease at L4-L5 and L5-S1 where there is disc height loss, endplate sclerosis, vacuum disc phenomenon and disc bulging. Disc bulges at multiple levels with potentially moderate canal stenosis at L3-L4 and mild to moderate canal stenosis at L2-L3. Foraminal stenosis is likely greatest on the right at L5-S1. Severe facet arthropathy at multiple levels. IMPRESSION: 1. Acute L3 superior endplate fracture with approximately 40% height loss centrally and approximately 4 mm of bony retropulsion. 2. Prior T12 compression fracture with approximately 60% height loss, similar to March 24, 2021. 3. Moderate to severe degenerative disease at L4-L5 and L5-S1 and multilevel severe facet arthropathy. Potentially moderate canal stenosis at L3-L4 and potentially mild to moderate canal stenosis at L2-L3. An MRI of the lumbar spine could better characterize the canal and foramina if clinically indicated. 4. Diffuse osteopenia. Electronically  Signed   By: Feliberto HartsFrederick S Jones MD   On: 05/31/2021 18:59    Procedures Procedures   Medications Ordered in ED Medications - No data to display  ED Course  I have reviewed the triage vital signs and the nursing notes.  Pertinent labs & imaging results that were available during my care of the patient were reviewed by me and considered in my medical decision making (see chart for details).    MDM Rules/Calculators/A&P                           Patient presents to the emergency department for evaluation of back pain.  Patient had a fall last week when she was trying to walk down steps.  She reports that she slipped and fell onto her back, went down 2 steps.  She did not hit her head or lose consciousness.  Patient complaining of severe low back pain since the fall which has prevented her from being ambulatory.  Work-up reveals 40% compression fracture of L3 with 4 mm bony retropulsion.  Examination is difficult.  Patient has extremely poor effort with moving the lower legs.  She has difficulty lifting either of them off of the bed but she has very good strength with flexion extension at the ankles.  This with Dr. Conchita ParisNundkumar.  Agrees with TLSO, will consult.  Will order MRI for further evaluation.  Admit to hospitalist group.  Final Clinical Impression(s) / ED Diagnoses Final diagnoses:  Closed compression fracture of L3 lumbar vertebra, initial encounter [facility](HCC)    Rx / DC Orders ED Discharge Orders     None        Gilda CreasePollina, Teagan Ozawa J, MD 06/01/21 [phone]

## 2021-06-01 NOTE — H&P (Addendum)
History and Physical    Alicia Vang KGM:[id number] DOB: July 31, 1927 DOA: 05/31/2021  PCP: Knox Royalty, MD  Patient coming from: Home  I have personally briefly reviewed patient's old medical records in [facility] Link  Chief Complaint: Back pain  HPI: Alicia Vang is a 87 y.o. female with medical history significant of DM2, HTN, probable PD.  Pt presents to ED with low back pain for the past week (last Thursday) and inability to ambulate following a mechanical fall at home.  Mechanical fall was down 1 step.  Pts daughter is her usual caregiver but has been out of town for past week.  Following fall, severe low back pain, inability to ambulate.  Pt been bed bound since fall.  Cared for by other family members.  After daughter got back in town, she brought pt to ED.  At baseline - pt was still ambulating around the street with a walker prior to fall.  Also report of AMS since fall, unknown if any head injury with fall.  No headache.  Pain worse with movement per patient.   ED Course: CT head and C spine are neg.  CT L spine demonstrates acute L3 compression fx.   Review of Systems: As per HPI, otherwise all review of systems negative.  Past Medical History:  Diagnosis Date   Arthritis    Diabetes mellitus without complication (HCC)    Type II   Hypertension    Hypokalemia     Past Surgical History:  Procedure Laterality Date   ABDOMINAL HYSTERECTOMY     CHALAZION EXCISION  11/06/2011   Procedure: MINOR EXCISION OF CHALAZION;  Surgeon: Vita Erm.;  Location: [facility];  Service: Ophthalmology;  Laterality: Left;   CHALAZION EXCISION  02/02/2012   Procedure: MINOR EXCISION OF CHALAZION;  Surgeon: Vita Erm., MD;  Location: [facility];  Service: Ophthalmology;  Laterality: Left;  left eye upper lid   CHALAZION EXCISION Right 03/28/2013   Procedure: MINOR EXCISION OF CHALAZION upper and lower right  eye ;  Surgeon: Vita Erm., MD;  Location: [facility];  Service: Ophthalmology;  Laterality: Right;   CHALAZION EXCISION Right 03/28/2013   Procedure: MINOR EXCISION OF CHALAZION UPPER AND LOWER RIGHT EYE  ;  Surgeon: Vita Erm., MD;  Location: [facility] OR;  Service: Ophthalmology;  Laterality: Right;   COLONOSCOPY     TOTAL HIP ARTHROPLASTY Left 10/26/2017   Procedure: TOTAL HIP ARTHROPLASTY ANTERIOR APPROACH;  Surgeon: Gean Birchwood, MD;  Location: [facility] OR;  Service: Orthopedics;  Laterality: Left;     reports that she has never smoked. She has never used smokeless tobacco. She reports that she does not drink alcohol and does not use drugs.  Allergies  Allergen Reactions   Amlodipine Swelling    Swelling in feet    Family History  Problem Relation Age of Onset   Stroke Mother    Hypertension Mother    Cataracts Mother    Cataracts Father    Heart attack Neg Hx      Prior to Admission medications   Medication Sig Start Date End Date Taking? Authorizing Provider  acetaminophen (TYLENOL) 325 MG tablet Take 2 tablets (650 mg total) by mouth every 6 (six) hours as needed for mild pain or headache (fever >/= 101). 10/24/20  Yes Lonia Blood, MD  brinzolamide (AZOPT) 1 % ophthalmic suspension Place 1 drop into both eyes at bedtime. 03/10/21  Yes [provider]  chlorthalidone (HYGROTON) 25 MG tablet Take 25 mg by mouth daily as needed (feet swelling). 10/27/20  Yes [provider]  glimepiride (AMARYL) 2 MG tablet Take 4 mg by mouth daily with breakfast.    Yes [provider]  latanoprost (XALATAN) 0.005 % ophthalmic solution Place 1 drop into both eyes at bedtime.   Yes [provider]  losartan (COZAAR) 100 MG tablet Take 100 mg by mouth daily.   Yes [provider]  Metoprolol Succinate 50 MG CS24 Take 50 mg by mouth daily.    Yes [provider]  Potassium Chloride ER 20 MEQ TBCR Take 20 mEq by  mouth daily as needed (feet swelling). 10/26/20  Yes [provider]  XIIDRA 5 % SOLN Place 1 drop into both eyes at bedtime. 07/26/20  Yes [provider]  XARELTO 15 MG TABS tablet Take 15 mg by mouth daily. Patient not taking: Reported on 06/01/2021 03/17/21   [provider]    Physical Exam: Vitals:   06/01/21 0415 06/01/21 0430 06/01/21 0445 06/01/21 0500  BP: (!) 146/62 (!) 106/51 (!) 104/46 (!) 99/47  Pulse: (!) 32 (!) 32 (!) 54 (!) 57  Resp: (!) 24 19 17 18   Temp:      SpO2: 98% 97% 97% 97%  Weight:      Height:        Constitutional: NAD, calm, comfortable Eyes: PERRL, lids and conjunctivae normal ENMT: Mucous membranes are moist. Posterior pharynx clear of any exudate or lesions.Normal dentition.  Neck: normal, supple, no masses, no thyromegaly Respiratory: clear to auscultation bilaterally, no wheezing, no crackles. Normal respiratory effort. No accessory muscle use.  Cardiovascular: IRR, IRR Abdomen: no tenderness, no masses palpated. No hepatosplenomegaly. Bowel sounds positive.  Musculoskeletal: Bony tenderness to lumbar spine Skin: no rashes, lesions, ulcers. No induration Neurologic: Poor effort to hip flexion, but good strength with ankle flexion.  Suspect possibly pain limited.  Resting "pill rolling" tremor in R hand noted. Psychiatric: Normal judgment and insight. Alert and oriented x 3. Normal mood.    Labs on Admission: I have personally reviewed following labs and imaging studies  CBC: Recent Labs  Lab 05/31/21 1716  WBC 7.0  NEUTROABS 5.5  HGB 11.8*  HCT 37.0  MCV 91.6  PLT 229   Basic Metabolic Panel: Recent Labs  Lab 05/31/21 1716  NA 134*  K 2.9*  CL 94*  CO2 28  GLUCOSE 300*  BUN 33*  CREATININE 1.15*  CALCIUM 9.7   GFR: Estimated Creatinine Clearance: 28.7 mL/min (A) (by C-G formula based on SCr of 1.15 mg/dL (H)). Liver Function Tests: Recent Labs  Lab 05/31/21 1716  AST 24  ALT 23  ALKPHOS 48   BILITOT 1.2  PROT 7.8  ALBUMIN 3.8   No results for input(s): LIPASE, AMYLASE in the last 168 hours. No results for input(s): AMMONIA in the last 168 hours. Coagulation Profile: Recent Labs  Lab 05/31/21 1716  INR 1.2   Cardiac Enzymes: No results for input(s): CKTOTAL, CKMB, CKMBINDEX, TROPONINI in the last 168 hours. BNP (last 3 results) No results for input(s): PROBNP in the last 8760 hours. HbA1C: No results for input(s): HGBA1C in the last 72 hours. CBG: No results for input(s): GLUCAP in the last 168 hours. Lipid Profile: No results for input(s): CHOL, HDL, LDLCALC, TRIG, CHOLHDL, LDLDIRECT in the last 72 hours. Thyroid Function Tests: No results for input(s): TSH, T4TOTAL, FREET4, T3FREE, THYROIDAB in the last 72  hours. Anemia Panel: No results for input(s): VITAMINB12, FOLATE, FERRITIN, TIBC, IRON, RETICCTPCT in the last 72 hours. Urine analysis:    Component Value Date/Time   COLORURINE YELLOW 05/31/2021 1716   APPEARANCEUR HAZY (A) 05/31/2021 1716   LABSPEC 1.019 05/31/2021 1716   PHURINE 5.0 05/31/2021 1716   GLUCOSEU 150 (A) 05/31/2021 1716   HGBUR SMALL (A) 05/31/2021 1716   BILIRUBINUR NEGATIVE 05/31/2021 1716   KETONESUR NEGATIVE 05/31/2021 1716   PROTEINUR 100 (A) 05/31/2021 1716   NITRITE NEGATIVE 05/31/2021 1716   LEUKOCYTESUR NEGATIVE 05/31/2021 1716    Radiological Exams on Admission: CT HEAD WO CONTRAST ( )  Result Date: 05/31/2021 CLINICAL DATA:  Unwitnessed fall EXAM: CT HEAD WITHOUT CONTRAST CT CERVICAL SPINE WITHOUT CONTRAST TECHNIQUE: Multidetector CT imaging of the head and cervical spine was performed following the standard protocol without intravenous contrast. Multiplanar CT image reconstructions of the cervical spine were also generated. COMPARISON:  CT brain 03/24/2021 FINDINGS: CT HEAD FINDINGS Brain: No acute territorial infarction, hemorrhage or intracranial mass. 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 Atrophy and chronic small  vessel ischemic changes of the white matter Vascular: No hyperdense vessel.  No unexpected calcification Skull: Normal. Negative for fracture or focal lesion. Sinuses/Orbits: No acute finding. Other: None CT CERVICAL SPINE FINDINGS Alignment: No subluxation.  Facet alignment maintained Skull base and vertebrae: No acute fracture. No primary bone lesion or focal pathologic process. Soft tissues and spinal canal: No prevertebral fluid or swelling. No visible canal hematoma. Disc levels: Moderate severe diffuse degenerative change C3 through T1. Facet degenerative changes at multiple levels with foraminal stenosis. Upper chest: Negative. Other: None IMPRESSION: 1. No CT evidence for acute intracranial abnormality. Atrophy and chronic small vessel ischemic changes of the white matter 2. Degenerative changes of the cervical spine. No acute osseous abnormality Electronically Signed   By: Jasmine Pang M.D.   On: 05/31/2021 19:11   CT Cervical Spine Wo Contrast  Result Date: 05/31/2021 CLINICAL DATA:  Unwitnessed fall EXAM: CT HEAD WITHOUT CONTRAST CT CERVICAL SPINE WITHOUT CONTRAST TECHNIQUE: Multidetector CT imaging of the head and cervical spine was performed following the standard protocol without intravenous contrast. Multiplanar CT image reconstructions of the cervical spine were also generated. COMPARISON:  CT brain 03/24/2021 FINDINGS: CT HEAD FINDINGS Brain: No acute territorial infarction, hemorrhage or intracranial mass. 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 Atrophy and chronic small vessel ischemic changes of the white matter Vascular: No hyperdense vessel.  No unexpected calcification Skull: Normal. Negative for fracture or focal lesion. Sinuses/Orbits: No acute finding. Other: None CT CERVICAL SPINE FINDINGS Alignment: No subluxation.  Facet alignment maintained Skull base and vertebrae: No acute fracture. No primary bone lesion or focal pathologic process. Soft tissues and spinal canal: No  prevertebral fluid or swelling. No visible canal hematoma. Disc levels: Moderate severe diffuse degenerative change C3 through T1. Facet degenerative changes at multiple levels with foraminal stenosis. Upper chest: Negative. Other: None IMPRESSION: 1. No CT evidence for acute intracranial abnormality. Atrophy and chronic small vessel ischemic changes of the white matter 2. Degenerative changes of the cervical spine. No acute osseous abnormality Electronically Signed   By: Jasmine Pang M.D.   On: 05/31/2021 19:11   CT Lumbar Spine Wo Contrast  Result Date: 05/31/2021 CLINICAL DATA:  Back trauma, no prior imaging (Age >= 16y) EXAM: CT LUMBAR SPINE WITHOUT CONTRAST TECHNIQUE: Multidetector CT imaging of the lumbar spine was performed without intravenous contrast administration. Multiplanar CT image reconstructions were also generated. COMPARISON:  CT renal March 24, 2021. FINDINGS: Segmentation: Standard. Straightening. Slight anterolisthesis of L2 on L3. Alignment is similar to the prior. Alignment: Grade 1 anterolisthesis of L2 on L3. Vertebrae: Acute L3 superior endplate fracture with approximately 40% height loss centrally and approximately 4 mm of bony retropulsion. There is a discrete lucency through the anterior/superior endplate and trabecular impaction. Prior T12 compression fracture with approximately 60% height loss, similar to March 24, 2021. Diffuse osteopenia. Paraspinal and other soft tissues: Aorta bi-iliac atherosclerosis. No evidence of acute abnormality. Disc levels: Moderate to severe degenerative disc disease at L4-L5 and L5-S1 where there is disc height loss, endplate sclerosis, vacuum disc phenomenon and disc bulging. Disc bulges at multiple levels with potentially moderate canal stenosis at L3-L4 and mild to moderate canal stenosis at L2-L3. Foraminal stenosis is likely greatest on the right at L5-S1. Severe facet arthropathy at multiple levels. IMPRESSION: 1. Acute L3 superior endplate fracture  with approximately 40% height loss centrally and approximately 4 mm of bony retropulsion. 2. Prior T12 compression fracture with approximately 60% height loss, similar to March 24, 2021. 3. Moderate to severe degenerative disease at L4-L5 and L5-S1 and multilevel severe facet arthropathy. Potentially moderate canal stenosis at L3-L4 and potentially mild to moderate canal stenosis at L2-L3. An MRI of the lumbar spine could better characterize the canal and foramina if clinically indicated. 4. Diffuse osteopenia. Electronically Signed   By: Feliberto Harts MD   On: 05/31/2021 18:59    EKG: Independently reviewed. Formal EKG pending, rate ~60 on monitor, IRR, IRR, ? A.Fib  Assessment/Plan Principal Problem:   Closed compression fracture of L3 lumbar vertebra, initial encounter (HCC) Active Problems:   Type 2 diabetes mellitus (HCC)   Parkinson's disease (HCC)   HTN (hypertension)    L3 compression fx - Poor effort with movement of lower legs, suspect may be pain limited, does have good ankle strength. MRI L spine to confirm no cord injury Dr. Conchita Paris to see in AM TLSO brace Will keep NPO and off of anticoags in case NS decides pt might benefit from vertebroplasty / kyphoplasty IVF LR at 75 [place] PRN pain DM2 - Hold home po hypoglycemic meds Sensitive SSI Q4H HTN - BP running low side today in ED after morphine Will hold all home BP meds for the moment PD - Tremor, ongoing over past year per daughter, suspected PD Pt not started on DA therapy secondary to age. ? A.Fib - Getting EKG Regardless, not going to start [facility] today on patient who is presenting today for major injury secondary to fall. Pt took herself off of AC in recent months.  DVT prophylaxis: SCDs Code Status: Full code for now Family Communication: Daughter at bedside Disposition Plan: TBD, pending NS evaluation Consults called: Dr. Conchita Paris Admission status: Place in obs     Creola Krotz M. DO [facility]  How to contact the [facility] Attending or Consulting provider 7A - 7P or covering provider during after hours 7P -7A, for this patient?  Check the care team in [facility] and look for a) attending/consulting [facility] provider listed and b) the [facility] team listed Log into [email or web address]  Amion Physician Scheduling and messaging for groups and whole hospitals  On call and physician scheduling software for group practices, residents, hospitalists and other medical providers for call, clinic, rotation and shift schedules. On[employer] is a hospital-wide system for scheduling doctors and paging doctors on call. EasyPlot is for scientific plotting and data analysis.  [email or web address]  and use [facility]'s universal password to access. If you do not have the password, please contact the hospital operator.  Locate the [facility] provider you are looking for under [facility] and page to a number that you can be directly reached. If you still have difficulty reaching the provider, please page the [facility] (Director on Call) for the Hospitalists listed on amion for assistance.  06/01/2021, 5:32 AM

## 2021-06-01 NOTE — Progress Notes (Signed)
Orthopedic Tech Progress Note Patient Details:  Alicia Vang 01-10-1927 [id number] Dropped off TLSO. Patient is currently waiting for MRI and will be admitted to hospital. Will call for hanger to put on brace in morning.  Ortho Devices Ortho Device/Splint Location: TLSO Ortho Device/Splint Interventions: Ordered   Post Interventions Patient Tolerated: Other (comment) Instructions Provided: Other (comment)  Michelle Piper 06/01/2021, 4:05 AM

## 2021-06-01 NOTE — Progress Notes (Signed)
OT Cancellation Note  Patient Details Name: DESTA BUJAK MRN: [id number] DOB: [birth year]   Cancelled Treatment:    Reason Eval/Treat Not Completed: Other (comment) Pt scheduled for MRI of back and awaiting neurosurgery consult due to lumbar fracture. Will assessment once cleared.   Tayten Bergdoll,HILLARY 06/01/2021, 9:44 AM Luisa Dago, OT/L   Acute OT Clinical Specialist Acute Rehabilitation Services Pager [phone] Office [phone]

## 2021-06-01 NOTE — Evaluation (Signed)
Physical Therapy Evaluation Patient Details Name: Alicia Vang MRN: [id number] DOB: 1927-10-21 Today's Date: 06/01/2021   History of Present Illness  40 my.o. female presents [facility] ED on 05/31/2021 with low back pain and inability to ambulate s/p fall at home. Pt found to have L3 endplate fx. PMH includes OA, DMII, HTN, hypokalemia.  Clinical Impression  Pt presents to PT with deficits in functional mobility, strength, power, activity tolerance, balance. Pt is generally weak and demonstrates the tendency to lean posteriorly when standing, greatly increasing falls risk. Pt requires physical assistance to perform all bed mobility and transfers at this time. Pt will benefit from chair follow next session if ambulation is attempted. Pt will benefit from SNF placement at the time of discharge to allow for increased assistance and continued progression toward baseline.     Follow Up Recommendations SNF    Equipment Recommendations  Wheelchair (measurements PT)    Recommendations for Other Services       Precautions / Restrictions Precautions Precautions: Fall;Back Precaution Booklet Issued: No Precaution Comments: PT verbally reviewed back precautions Required Braces or Orthoses: Spinal Brace Spinal Brace: Thoracolumbosacral orthotic;Applied in sitting position Restrictions Weight Bearing Restrictions: No      Mobility  Bed Mobility Overal bed mobility: Needs Assistance Bed Mobility: Rolling;Sidelying to Sit Rolling: Min assist Sidelying to sit: Mod assist       General bed mobility comments: verbal and tactile cues for technique, physical assistance to initiate roll    Transfers Overall transfer level: Needs assistance Equipment used: Rolling walker (2 wheeled) Transfers: Sit to/from [employer] Sit to Stand: Mod assist Stand pivot transfers: Mod assist       General transfer comment: pt requires assistance to power up into standing and facilitate  trunk extension once upright  Ambulation/Gait                Stairs            Wheelchair Mobility    Modified Rankin (Stroke Patients Only)       Balance Overall balance assessment: Needs assistance Sitting-balance support: Single extremity supported;Feet supported Sitting balance-[name] Scale: Poor Sitting balance - Comments: reliant on unilateral UE support   Standing balance support: Bilateral upper extremity supported Standing balance-[name] Scale: Poor Standing balance comment: reliant on UE support and minA                             Pertinent Vitals/Pain Pain Assessment: Faces Faces Pain Scale: Hurts a little bit Pain Location: generalized with mobility Pain Descriptors / Indicators: Grimacing Pain Intervention(s): Monitored during session    Home Living Family/patient expects to be discharged to:: Private residence Living Arrangements: Children Available Help at Discharge: Family;Available PRN/intermittently Type of Home: House Home Access: Stairs to enter Entrance Stairs-Rails: Left Entrance Stairs-Number of Steps: 3 Home Layout: Two level;Bed/bath upstairs Home Equipment: Walker - 4 wheels Additional Comments: pt is a limited historian    Prior Function Level of Independence: Needs assistance   Gait / Transfers Assistance Needed: pt reports ambulating with rollator  ADL's / Homemaking Assistance Needed: pt reports requiring assistance for all ADLs other than feeding        Hand Dominance   Dominant Hand: Right    Extremity/Trunk Assessment   Upper Extremity Assessment Upper Extremity Assessment: Generalized weakness    Lower Extremity Assessment Lower Extremity Assessment: Generalized weakness    Cervical / Trunk Assessment Cervical / Trunk Assessment:  Kyphotic  Communication   Communication: HOH  Cognition Arousal/Alertness: Awake/alert Behavior During Therapy: Anxious (pt reports a fear of walking due to recent  fall) Overall Cognitive Status: No family/caregiver present to determine baseline cognitive functioning                                 General Comments: pt responds to one-step commands with increased time. Pt demonstrates some awareness of deficits in mobility. Pt will benefit from further cognitive assessment at next session      General Comments General comments (skin integrity, edema, etc.): VSS on RA    Exercises     Assessment/Plan    PT Assessment Patient needs continued PT services  PT Problem List Decreased strength;Decreased activity tolerance;Decreased balance;Decreased mobility;Decreased cognition;Decreased knowledge of precautions       PT Treatment Interventions Gait training;DME instruction;Stair training;Functional mobility training;Therapeutic activities;Therapeutic exercise;Balance training;Patient/family education;Wheelchair mobility training    PT Goals (Current goals can be found in the Care Plan section)  Acute Rehab PT Goals Patient Stated Goal: to improve mobility quality and reduce falls risk PT Goal Formulation: With patient Time For Goal Achievement: 06/15/21 Potential to Achieve Goals: Fair    Frequency Min 3X/week (maintain 3x until SNF confirmed)   Barriers to discharge Inaccessible home environment (difficulty with stair negotiation)      Co-evaluation               AM-PAC PT "6 Clicks" Mobility  Outcome Measure Help needed turning from your back to your side while in a flat bed without using bedrails?: A Little Help needed moving from lying on your back to sitting on the side of a flat bed without using bedrails?: A Lot Help needed moving to and from a bed to a chair (including a wheelchair)?: A Lot Help needed standing up from a chair using your arms (e.g., wheelchair or bedside chair)?: A Lot Help needed to walk in hospital room?: A Lot Help needed climbing 3-5 steps with a railing? : Total 6 Click Score: 12    End  of Session Equipment Utilized During Treatment: Back brace Activity Tolerance: Patient tolerated treatment well Patient left: in chair;with call bell/phone within reach;with chair alarm set Nurse Communication: Mobility status PT Visit Diagnosis: Other abnormalities of gait and mobility (R26.89);Muscle weakness (generalized) (M62.81);History of falling (Z91.81)    Time: 0160-1093 PT Time Calculation (min) (ACUTE ONLY): 24 min   Charges:   PT Evaluation $PT Eval Low Complexity: 1 Low          Arlyss Gandy, PT, DPT Acute Rehabilitation Pager: [phone]   Arlyss Gandy 06/01/2021, 5:52 PM

## 2021-06-01 NOTE — Consult Note (Signed)
Chief Complaint   Chief Complaint  Patient presents with   Back Pain   Fall    History of Present Illness  Alicia Vang is a 87 y.o. female admitted after being brought in by family for AMS and inability to walk due to back pain. Pt unable to provide meaningful history. Pt does cont to report back pain today, states a little better than yesterday.  Past Medical History   Past Medical History:  Diagnosis Date   Arthritis    Diabetes mellitus without complication (HCC)    Type II   Hypertension    Hypokalemia     Past Surgical History   Past Surgical History:  Procedure Laterality Date   ABDOMINAL HYSTERECTOMY     CHALAZION EXCISION  11/06/2011   Procedure: MINOR EXCISION OF CHALAZION;  Surgeon: Vita Erm.;  Location: [facility];  Service: Ophthalmology;  Laterality: Left;   CHALAZION EXCISION  02/02/2012   Procedure: MINOR EXCISION OF CHALAZION;  Surgeon: Vita Erm., MD;  Location: [facility];  Service: Ophthalmology;  Laterality: Left;  left eye upper lid   CHALAZION EXCISION Right 03/28/2013   Procedure: MINOR EXCISION OF CHALAZION upper and lower right eye ;  Surgeon: Vita Erm., MD;  Location: [facility];  Service: Ophthalmology;  Laterality: Right;   CHALAZION EXCISION Right 03/28/2013   Procedure: MINOR EXCISION OF CHALAZION UPPER AND LOWER RIGHT EYE  ;  Surgeon: Vita Erm., MD;  Location: [facility];  Service: Ophthalmology;  Laterality: Right;   COLONOSCOPY     TOTAL HIP ARTHROPLASTY Left 10/26/2017   Procedure: TOTAL HIP ARTHROPLASTY ANTERIOR APPROACH;  Surgeon: Gean Birchwood, MD;  Location: [facility] OR;  Service: Orthopedics;  Laterality: Left;    Social History   Social History   Tobacco Use   Smoking status: Never   Smokeless tobacco: Never  Vaping Use   Vaping Use: Never used  Substance Use Topics   Alcohol use: No   Drug use: No    Medications   Prior to Admission  medications   Medication Sig Start Date End Date Taking? Authorizing Provider  acetaminophen (TYLENOL) 325 MG tablet Take 2 tablets (650 mg total) by mouth every 6 (six) hours as needed for mild pain or headache (fever >/= 101). 10/24/20  Yes Lonia Blood, MD  brinzolamide (AZOPT) 1 % ophthalmic suspension Place 1 drop into both eyes at bedtime. 03/10/21  Yes [provider]  chlorthalidone (HYGROTON) 25 MG tablet Take 25 mg by mouth daily as needed (feet swelling). 10/27/20  Yes [provider]  glimepiride (AMARYL) 2 MG tablet Take 4 mg by mouth daily with breakfast.    Yes [provider]  latanoprost (XALATAN) 0.005 % ophthalmic solution Place 1 drop into both eyes at bedtime.   Yes [provider]  losartan (COZAAR) 100 MG tablet Take 100 mg by mouth daily.   Yes [provider]  Metoprolol Succinate 50 MG CS24 Take 50 mg by mouth daily.    Yes [provider]  Potassium Chloride ER 20 MEQ TBCR Take 20 mEq by mouth daily as needed (feet swelling). 10/26/20  Yes [provider]  XIIDRA 5 % SOLN Place 1 drop into both eyes at bedtime. 07/26/20  Yes [provider]  XARELTO 15 MG TABS tablet Take 15 mg by mouth daily. Patient not taking: Reported on 06/01/2021 03/17/21 06/01/21  [provider]    Allergies  Allergies  Allergen Reactions   Amlodipine Swelling    Swelling in feet    Review of Systems  ROS  Neurologic Exam  Awake, alert Speech fluent, appropriate CN grossly intact Poor effort on motor exam, but MAE symmetrically, no obvious deficit Sensation grossly intact to LT  Imaging  MRI reviewed demonstrating acute superior endplate fracture of L3 with anterior wedge compression deformity. There is multilevel degenerative disease.  Impression  - 87 y.o. female s/p fall with back pain and L3 wedge compression fracture.   Plan  - Would treat conservatively with oral analgesia and TLSO bracing when  up - PT/OT - Can f/u in outpatient NS clinic in 3-4 weeks for continued clinical and radiographic monitoring.  Lisbeth Renshaw, MD [facility]

## 2021-06-01 NOTE — Progress Notes (Signed)
PROGRESS NOTE    Alicia Vang  UJW:[id number] DOB: Oct 08, 1927 DOA: 05/31/2021 PCP: Knox RoyaltyJones, Enrico, MD   Chief Complaint  Patient presents with   Back Pain   Fall    Brief Narrative:  Alicia Vang is Alicia Vang 87 y.o. female with medical history significant of DM2, HTN, probable PD.   Pt presents to ED with low back pain for the past week (last Thursday) and inability to ambulate following Jaylnn Ullery mechanical fall at home.  Mechanical fall was down 1 step.   Pts daughter is her usual caregiver but has been out of town for past week.  Following fall, severe low back pain, inability to ambulate.  Pt been bed bound since fall.  Cared for by other family members.   After daughter got back in town, she brought pt to ED.   At baseline - pt was still ambulating around the street with Raynell Scott walker prior to fall.   Also report of AMS since fall, unknown if any head injury with fall.   No headache.   Pain worse with movement per patient.     ED Course: CT head and C spine are neg.   CT L spine demonstrates acute L3 compression fx.  Assessment & Plan:   Principal Problem:   Closed compression fracture of L3 lumbar vertebra, initial encounter [facility](HCC) Active Problems:   Type 2 diabetes mellitus (HCC)   Parkinson's disease (HCC)   HTN (hypertension)  Mechanical Fall  Acute to Subacute L3 Compression Fracture MRI with acute to subacute L3 compression fx with up to 40% loss of central verebral body height. mild anterolisthesis of L2 on L3 and mild retropulsion of the posterosuperior endplate contribute to moderate spinal stenosis at the level of the conus medullaris.  Up to mild conus mass effect, but no conus or lower thoracic spinal cord signal abnormality.  Moderate L2 foraminal stenosis. (See report) PT/OT Neurosurgery recommending TLSO brace when up.  Conservative management with oral analgesia.  Outpatient follow up with NS in 3-4 weeks.  Scheduled APAP, oxy prn.  Morphine for  breakthrough.  T2DM SSI for now A1c 7.3  Hypertension Holding home chlorthalidone and losartan and metoprolol  Will gradually resume as tolerated, metoprolol for tomorrow  Suspected Parkinson's Disease Ongoing tremor for past year, suspected PD Not on any meds at this point  Concern for atrial fibrillation Suspect related to fine tremor Follow repeat EKG and telemetry   DVT prophylaxis: SCD Code Status:full  Family Communication: daughter at bedside Disposition:   Status is: Observation  The patient remains OBS appropriate and will d/c before 2 midnights.  Dispo: The patient is from: Home              Anticipated d/c is to: Home              Patient currently is not medically stable to d/c.   Difficult to place patient No       Consultants:  neurosurgery  Procedures:  none  Antimicrobials: = Anti-infectives (From admission, onward)    None          Subjective: Sleeping, daughter at bedside, notes she was just awake  Objective: Vitals:   06/01/21 1130 06/01/21 1348 06/01/21 1638 06/01/21 1641  BP: (!) 146/66 (!) 147/66 (!) 191/71   Pulse: (!) 50 (!) 55 60 61  Resp: 17 16 14    Temp:  98.6 F (37 C) 97.9 F (36.6 C)   TempSrc:  Axillary Oral   SpO2: 100%  98%  100%  Weight:      Height:        Intake/Output Summary (Last 24 hours) at 06/01/2021 1829 Last data filed at 06/01/2021 1800 Gross per 24 hour  Intake 853.65 ml  Output --  Net 853.65 ml   Filed Weights   05/31/21 1701  Weight: 58.1 kg    Examination:  General exam: Appears calm and comfortable  Respiratory system: Clear to auscultation. Respiratory effort normal. Cardiovascular system: S1 & S2 heard, RRR Gastrointestinal system: Abdomen is nondistended, soft and nontender.  Central nervous system: sleeping Extremities: no LEE  Data Reviewed: I have personally reviewed following labs and imaging studies  CBC: Recent Labs  Lab 05/31/21 1716  WBC 7.0  NEUTROABS 5.5   HGB 11.8*  HCT 37.0  MCV 91.6  PLT 229    Basic Metabolic Panel: Recent Labs  Lab 05/31/21 1716  NA 134*  K 2.9*  CL 94*  CO2 28  GLUCOSE 300*  BUN 33*  CREATININE 1.15*  CALCIUM 9.7    GFR: Estimated Creatinine Clearance: 28.7 mL/min (Aundrea Higginbotham) (by C-G formula based on SCr of 1.15 mg/dL (H)).  Liver Function Tests: Recent Labs  Lab 05/31/21 1716  AST 24  ALT 23  ALKPHOS 48  BILITOT 1.2  PROT 7.8  ALBUMIN 3.8    CBG: Recent Labs  Lab 06/01/21 0626 06/01/21 0755 06/01/21 1135 06/01/21 1636  GLUCAP 209* 161* 72 153*     Recent Results (from the past 240 hour(s))  Resp Panel by RT-PCR (Flu Xavious Sharrar&B, Covid) Nasopharyngeal Swab     Status: None   Collection Time: 06/01/21  3:01 AM   Specimen: Nasopharyngeal Swab; Nasopharyngeal(NP) swabs in vial transport medium  Result Value Ref Range Status   SARS Coronavirus 2 by RT PCR NEGATIVE NEGATIVE Final    Comment: (NOTE) SARS-CoV-2 target nucleic acids are NOT DETECTED.  The SARS-CoV-2 RNA is generally detectable in upper respiratory specimens during the acute phase of infection. The lowest concentration of SARS-CoV-2 viral copies this assay can detect is 138 copies/mL. Zaylah Blecha negative result does not preclude SARS-Cov-2 infection and should not be used as the sole basis for treatment or other patient management decisions. Gen Clagg negative result may occur with  improper specimen collection/handling, submission of specimen other than nasopharyngeal swab, presence of viral mutation(s) within the areas targeted by this assay, and inadequate number of viral copies(<138 copies/mL). Darrel Baroni negative result must be combined with clinical observations, patient history, and epidemiological information. The expected result is Negative.  Fact Sheet for Patients:  BloggerCourse.com  Fact Sheet for Healthcare Providers:  SeriousBroker.it  This test is no t yet approved or cleared by the [place] FDA and  has been authorized for detection and/or diagnosis of SARS-CoV-2 by FDA under an Emergency Use Authorization (EUA). This EUA will remain  in effect (meaning this test can be used) for the duration of the COVID-19 declaration under Section 564(b)(1) of the Act, 21 U.S.C.section 360bbb-3(b)(1), unless the authorization is terminated  or revoked sooner.       Influenza Arabell Neria by PCR NEGATIVE NEGATIVE Final   Influenza B by PCR NEGATIVE NEGATIVE Final    Comment: (NOTE) The Xpert Xpress SARS-CoV-2/FLU/RSV plus assay is intended as an aid in the diagnosis of influenza from Nasopharyngeal swab specimens and should not be used as Cam Harnden sole basis for treatment. Nasal washings and aspirates are unacceptable for Xpert Xpress SARS-CoV-2/FLU/RSV testing.  Fact Sheet for Patients: BloggerCourse.com  Fact Sheet for Healthcare Providers:  SeriousBroker.it  This test is not yet approved or cleared by the [place] and has been authorized for detection and/or diagnosis of SARS-CoV-2 by FDA under an Emergency Use Authorization (EUA). This EUA will remain in effect (meaning this test can be used) for the duration of the COVID-19 declaration under Section 564(b)(1) of the Act, 21 U.S.C. section 360bbb-3(b)(1), unless the authorization is terminated or revoked.  Performed at [facility], [address] [place], [place] [address]          Radiology Studies: CT HEAD WO CONTRAST ( )  Result Date: 05/31/2021 CLINICAL DATA:  Unwitnessed fall EXAM: CT HEAD WITHOUT CONTRAST CT CERVICAL SPINE WITHOUT CONTRAST TECHNIQUE: Multidetector CT imaging of the head and cervical spine was performed following the standard protocol without intravenous contrast. Multiplanar CT image reconstructions of the cervical spine were also generated. COMPARISON:  CT brain 03/24/2021 FINDINGS: CT HEAD FINDINGS Brain: No acute territorial infarction,  hemorrhage or intracranial mass. 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 Atrophy and chronic small vessel ischemic changes of the white matter Vascular: No hyperdense vessel.  No unexpected calcification Skull: Normal. Negative for fracture or focal lesion. Sinuses/Orbits: No acute finding. Other: None CT CERVICAL SPINE FINDINGS Alignment: No subluxation.  Facet alignment maintained Skull base and vertebrae: No acute fracture. No primary bone lesion or focal pathologic process. Soft tissues and spinal canal: No prevertebral fluid or swelling. No visible canal hematoma. Disc levels: Moderate severe diffuse degenerative change C3 through T1. Facet degenerative changes at multiple levels with foraminal stenosis. Upper chest: Negative. Other: None IMPRESSION: 1. No CT evidence for acute intracranial abnormality. Atrophy and chronic small vessel ischemic changes of the white matter 2. Degenerative changes of the cervical spine. No acute osseous abnormality Electronically Signed   By: Jasmine Pang M.D.   On: 05/31/2021 19:11   CT Cervical Spine Wo Contrast  Result Date: 05/31/2021 CLINICAL DATA:  Unwitnessed fall EXAM: CT HEAD WITHOUT CONTRAST CT CERVICAL SPINE WITHOUT CONTRAST TECHNIQUE: Multidetector CT imaging of the head and cervical spine was performed following the standard protocol without intravenous contrast. Multiplanar CT image reconstructions of the cervical spine were also generated. COMPARISON:  CT brain 03/24/2021 FINDINGS: CT HEAD FINDINGS Brain: No acute territorial infarction, hemorrhage or intracranial mass. 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 Atrophy and chronic small vessel ischemic changes of the white matter Vascular: No hyperdense vessel.  No unexpected calcification Skull: Normal. Negative for fracture or focal lesion. Sinuses/Orbits: No acute finding. Other: None CT CERVICAL SPINE FINDINGS Alignment: No subluxation.  Facet alignment maintained Skull base and vertebrae: No acute  fracture. No primary bone lesion or focal pathologic process. Soft tissues and spinal canal: No prevertebral fluid or swelling. No visible canal hematoma. Disc levels: Moderate severe diffuse degenerative change C3 through T1. Facet degenerative changes at multiple levels with foraminal stenosis. Upper chest: Negative. Other: None IMPRESSION: 1. No CT evidence for acute intracranial abnormality. Atrophy and chronic small vessel ischemic changes of the white matter 2. Degenerative changes of the cervical spine. No acute osseous abnormality Electronically Signed   By: Jasmine Pang M.D.   On: 05/31/2021 19:11   CT Lumbar Spine Wo Contrast  Result Date: 05/31/2021 CLINICAL DATA:  Back trauma, no prior imaging (Age >= 16y) EXAM: CT LUMBAR SPINE WITHOUT CONTRAST TECHNIQUE: Multidetector CT imaging of the lumbar spine was performed without intravenous contrast administration. Multiplanar CT image reconstructions were also generated. COMPARISON:  CT renal March 24, 2021. FINDINGS: Segmentation: Standard. Straightening.  Slight anterolisthesis of L2 on L3. Alignment is similar to the prior. Alignment: Grade 1 anterolisthesis of L2 on L3. Vertebrae: Acute L3 superior endplate fracture with approximately 40% height loss centrally and approximately 4 mm of bony retropulsion. There is Cesareo Vickrey discrete lucency through the anterior/superior endplate and trabecular impaction. Prior T12 compression fracture with approximately 60% height loss, similar to March 24, 2021. Diffuse osteopenia. Paraspinal and other soft tissues: Aorta bi-iliac atherosclerosis. No evidence of acute abnormality. Disc levels: Moderate to severe degenerative disc disease at L4-L5 and L5-S1 where there is disc height loss, endplate sclerosis, vacuum disc phenomenon and disc bulging. Disc bulges at multiple levels with potentially moderate canal stenosis at L3-L4 and mild to moderate canal stenosis at L2-L3. Foraminal stenosis is likely greatest on the right at L5-S1.  Severe facet arthropathy at multiple levels. IMPRESSION: 1. Acute L3 superior endplate fracture with approximately 40% height loss centrally and approximately 4 mm of bony retropulsion. 2. Prior T12 compression fracture with approximately 60% height loss, similar to March 24, 2021. 3. Moderate to severe degenerative disease at L4-L5 and L5-S1 and multilevel severe facet arthropathy. Potentially moderate canal stenosis at L3-L4 and potentially mild to moderate canal stenosis at L2-L3. An MRI of the lumbar spine could better characterize the canal and foramina if clinically indicated. 4. Diffuse osteopenia. Electronically Signed   By: Feliberto Harts MD   On: 05/31/2021 18:59   MR LUMBAR SPINE WO CONTRAST  Result Date: 06/01/2021 CLINICAL DATA:  87 year old female status post fall last week. Lower thoracic and lumbar compression fractures on CT yesterday. EXAM: MRI LUMBAR SPINE WITHOUT CONTRAST TECHNIQUE: Multiplanar, multisequence MR imaging of the lumbar spine was performed. No intravenous contrast was administered. COMPARISON:  Lumbar spine CT yesterday. CT Abdomen and Pelvis 03/24/2021. FINDINGS: Segmentation:  Normal on the comparisons. Alignment: Stable straightening of lumbar lordosis. Mild anterolisthesis of L2 on L3 is stable since yesterday and measures 2-3 mm. Vertebrae: Moderate chronic T12 and mild L1 compression fractures appear stable since the June CT and are without convincing marrow edema. L3 superior endplate compression with up to 40% loss of central vertebral body height is associated with fracture fragments and marrow edema. Small volume of frank hematoma suspected in the anterior superior endplate. Heterogeneity in the adjacent L2-L3 disc is likely posttraumatic. As demonstrated yesterday the L3 pedicles and posterior elements appear to remain intact. L2, L4, L5, and the visible sacrum remain intact. Conus medullaris and cauda equina: Conus extends to the L2-L3 level, see details below. No  signal abnormality in the lower thoracic spinal cord, with capacious spinal canal at most levels. Paraspinal and other soft tissues: Mild medial psoas muscle edema centered at L3. No paraspinal muscle fluid collection. Benign appearing hepatic cysts, and other stable visible abdominal viscera. Disc levels: No lower thoracic spinal stenosis through L2. L2-L3: Circumferential disc bulge with mild retropulsion of the L3 posterosuperior endplate and mild to moderate posterior element hypertrophy combine for mild to moderate spinal stenosis at the level of the conus (series 6, image 17 and series 2, image 8). Up to mild conus mass effect but no conus signal abnormality. There is moderate associated L2 neural foraminal stenosis greater on the left. L3-L4: Degenerative appearing mild spinal stenosis with circumferential disc bulge, mild to moderate posterior element hypertrophy. Moderate bilateral L3 foraminal stenosis. No significant spinal stenosis in the lower lumbar spine. IMPRESSION: 1. Acute to subacute L3 compression fracture with up to 40% loss of central vertebral body height. Mild anterolisthesis of L2  on L3 and mild retropulsion of the posterosuperior endplate contribute to moderate spinal stenosis at the level of the conus medullaris. Up to mild conus mass effect but no conus or lower thoracic spinal cord signal abnormality. Moderate L2 foraminal stenosis. 2. No other acute osseous abnormality in the lumbar spine. Chronic T12 and L1 compression fractures. 3. Mild degenerative spinal stenosis at L3-L4 with moderate bilateral L3 foraminal stenosis. Electronically Signed   By: Odessa Fleming M.D.   On: 06/01/2021 06:15        Scheduled Meds:  acetaminophen  1,000 mg Oral Q8H   brinzolamide  1 drop Both Eyes QHS   insulin aspart  0-9 Units Subcutaneous Q4H   latanoprost  1 drop Both Eyes QHS   lidocaine  1 patch Transdermal Q24H   Continuous Infusions:  lactated ringers 75 mL/hr at 06/01/21 0629     LOS:  0 days    Time spent: over 30 min    Lacretia Nicks, MD [facility]   To contact the attending provider between 7A-7P or the covering provider during after hours 7P-7A, please log into the web site [email or web address] and access using universal [place] password for that web site. If you do not have the password, please call the hospital operator.  06/01/2021, 6:29 PM

## 2021-06-01 NOTE — ED Notes (Signed)
Report given to Crystal B, RN   

## 2021-06-02 DIAGNOSIS — S32030A Wedge compression fracture of third lumbar vertebra, initial encounter for closed fracture: Secondary | ICD-10-CM | POA: Diagnosis not present

## 2021-06-02 LAB — URINE CULTURE

## 2021-06-02 LAB — CBC WITH DIFFERENTIAL/PLATELET
Abs Immature Granulocytes: 0.01 10*3/uL (ref 0.00–0.07)
Basophils Absolute: 0 10*3/uL (ref 0.0–0.1)
Basophils Relative: 1 %
Eosinophils Absolute: 0.1 10*3/uL (ref 0.0–0.5)
Eosinophils Relative: 2 %
HCT: 34 % — ABNORMAL LOW (ref 36.0–46.0)
Hemoglobin: 10.9 g/dL — ABNORMAL LOW (ref 12.0–15.0)
Immature Granulocytes: 0 %
Lymphocytes Relative: 17 %
Lymphs Abs: 0.7 10*3/uL (ref 0.7–4.0)
MCH: 28.7 pg (ref 26.0–34.0)
MCHC: 32.1 g/dL (ref 30.0–36.0)
MCV: 89.5 fL (ref 80.0–100.0)
Monocytes Absolute: 0.5 10*3/uL (ref 0.1–1.0)
Monocytes Relative: 11 %
Neutro Abs: 3 10*3/uL (ref 1.7–7.7)
Neutrophils Relative %: 69 %
Platelets: 199 10*3/uL (ref 150–400)
RBC: 3.8 MIL/uL — ABNORMAL LOW (ref 3.87–5.11)
RDW: 14 % (ref 11.5–15.5)
WBC: 4.3 10*3/uL (ref 4.0–10.5)
nRBC: 0 % (ref 0.0–0.2)

## 2021-06-02 LAB — MAGNESIUM: Magnesium: 1.8 mg/dL (ref 1.7–2.4)

## 2021-06-02 LAB — COMPREHENSIVE METABOLIC PANEL
ALT: 16 U/L (ref 0–44)
AST: 17 U/L (ref 15–41)
Albumin: 3 g/dL — ABNORMAL LOW (ref 3.5–5.0)
Alkaline Phosphatase: 35 U/L — ABNORMAL LOW (ref 38–126)
Anion gap: 9 (ref 5–15)
BUN: 21 mg/dL (ref 8–23)
CO2: 28 mmol/L (ref 22–32)
Calcium: 9.2 mg/dL (ref 8.9–10.3)
Chloride: 99 mmol/L (ref 98–111)
Creatinine, Ser: 0.93 mg/dL (ref 0.44–1.00)
GFR, Estimated: 58 mL/min — ABNORMAL LOW (ref >60)
Glucose, Bld: 148 mg/dL — ABNORMAL HIGH (ref 70–99)
Potassium: 3.3 mmol/L — ABNORMAL LOW (ref 3.5–5.1)
Sodium: 136 mmol/L (ref 135–145)
Total Bilirubin: 0.9 mg/dL (ref 0.3–1.2)
Total Protein: 6.4 g/dL — ABNORMAL LOW (ref 6.5–8.1)

## 2021-06-02 LAB — GLUCOSE, CAPILLARY
Glucose-Capillary: 117 mg/dL — ABNORMAL HIGH (ref 70–99)
Glucose-Capillary: 156 mg/dL — ABNORMAL HIGH (ref 70–99)
Glucose-Capillary: 137 mg/dL — ABNORMAL HIGH (ref 70–99)
Glucose-Capillary: 218 mg/dL — ABNORMAL HIGH (ref 70–99)
Glucose-Capillary: 168 mg/dL — ABNORMAL HIGH (ref 70–99)

## 2021-06-02 LAB — PHOSPHORUS: Phosphorus: 3.1 mg/dL (ref 2.5–4.6)

## 2021-06-02 MED ORDER — LOSARTAN POTASSIUM 50 MG PO TABS
100.0000 mg | ORAL_TABLET | Freq: Every day | ORAL | Status: DC
  Administered 2021-06-03 – 2021-06-06 (×5): 100 mg via ORAL
  Filled 2021-06-02 (×7): qty 2

## 2021-06-02 NOTE — Progress Notes (Signed)
PROGRESS NOTE    Alicia Vang  EQA:[id number] DOB: 1927/03/03 DOA: 05/31/2021 PCP: Alicia Royalty, MD   Chief Complaint  Patient presents with   Back Pain   Fall    Brief Narrative:  Alicia Vang is Alicia Vang 87 y.o. female with medical history significant of DM2, HTN, probable PD.   Pt presents to ED with low back pain for the past week (last Thursday) and inability to ambulate following Alicia Vang mechanical fall at home.  Mechanical fall was down 1 step.   Pts daughter is her usual caregiver but has been out of town for past week.  Following fall, severe low back pain, inability to ambulate.  Pt been bed bound since fall.  Cared for by other family members.   After daughter got back in town, she brought pt to ED.   At baseline - pt was still ambulating around the street with Alicia Vang walker prior to fall.   Also report of AMS since fall, unknown if any head injury with fall.   No headache.   Pain worse with movement per patient.     ED Course: CT head and C spine are neg.   CT L spine demonstrates acute L3 compression fx.  Assessment & Plan:   Principal Problem:   Closed compression fracture of L3 lumbar vertebra, initial encounter [facility]) Active Problems:   Type 2 diabetes mellitus (HCC)   Parkinson's disease (HCC)   HTN (hypertension)  Mechanical Fall  Acute to Subacute L3 Compression Fracture MRI with acute to subacute L3 compression fx with up to 40% loss of central verebral body height. mild anterolisthesis of L2 on L3 and mild retropulsion of the posterosuperior endplate contribute to moderate spinal stenosis at the level of the conus medullaris.  Up to mild conus mass effect, but no conus or lower thoracic spinal cord signal abnormality.  Moderate L2 foraminal stenosis. (See report) PT/OT Neurosurgery recommending TLSO brace when up.  Conservative management with oral analgesia.  Outpatient follow up with NS in 3-4 weeks.  Scheduled APAP, oxy prn.  Morphine for  breakthrough. Plan for snf  T2DM SSI for now A1c 7.3  Hypertension Resume losartan and metoprolol  Chlorthalidone is given as needed at home BP higher now, she's asymptomatic, will ctm  Suspected Parkinson's Disease Ongoing tremor for past year, suspected PD Not on any meds at this point  Concern for atrial fibrillation Suspect related to fine tremor Follow repeat EKG and telemetry - sinus    DVT prophylaxis: SCD Code Status:full  Family Communication: daughter at bedside 8/10 Disposition:   Status is: Observation  The patient remains OBS appropriate and will d/c before 2 midnights.  Dispo: The patient is from: Home              Anticipated d/c is to: Home              Patient currently is not medically stable to d/c.   Difficult to place patient No       Consultants:  neurosurgery  Procedures:  none  Antimicrobials: = Anti-infectives (From admission, onward)    None          Subjective: Confused No complaints for me today  Objective: Vitals:   06/02/21 0401 06/02/21 0746 06/02/21 1225 06/02/21 1714  BP: (!) 152/84 (!) 159/64 (!) 192/65 (!) 189/69  Pulse: (!) 55 (!) 55 (!) 56 (!) 58  Resp: 15     Temp: 98.5 F (36.9 C) 97.7 F (36.5 C)  TempSrc: Oral Oral    SpO2: 100% (!) 63% 98% 100%  Weight:      Height:        Intake/Output Summary (Last 24 hours) at 06/02/2021 1955 Last data filed at 06/02/2021 1800 Gross per 24 hour  Intake 1734.85 ml  Output 650 ml  Net 1084.85 ml   Filed Weights   05/31/21 1701  Weight: 58.1 kg    Examination:  General: No acute distress, laying on R side Cardiovascular: RRR Lungs: unlabored, CTAB Abdomen: Soft, nontender, nondistended  No significant midline TTP of L spine when palpated toda Neurological: Alert, but confused. Moves all extremities 4. Cranial nerves II through XII grossly intact. Skin: Warm and dry. No rashes or lesions. Extremities: No clubbing or cyanosis. No edema.    Data  Reviewed: I have personally reviewed following labs and imaging studies  CBC: Recent Labs  Lab 05/31/21 1716 06/02/21 0133  WBC 7.0 4.3  NEUTROABS 5.5 3.0  HGB 11.8* 10.9*  HCT 37.0 34.0*  MCV 91.6 89.5  PLT 229 199    Basic Metabolic Panel: Recent Labs  Lab 05/31/21 1716 06/01/21 1859 06/02/21 0133  NA 134* 136 136  K 2.9* 3.5 3.3*  CL 94* 97* 99  CO2 28 28 28   GLUCOSE 300* 181* 148*  BUN 33* 22 21  CREATININE 1.15* 0.96 0.93  CALCIUM 9.7 9.4 9.2  MG  --   --  1.8  PHOS  --   --  3.1    GFR: Estimated Creatinine Clearance: 35.5 mL/min (by C-G formula based on SCr of 0.93 mg/dL).  Liver Function Tests: Recent Labs  Lab 05/31/21 1716 06/02/21 0133  AST 24 17  ALT 23 16  ALKPHOS 48 35*  BILITOT 1.2 0.9  PROT 7.8 6.4*  ALBUMIN 3.8 3.0*    CBG: Recent Labs  Lab 06/01/21 2356 06/02/21 0357 06/02/21 0746 06/02/21 1225 06/02/21 1717  GLUCAP 163* 117* 156* 137* 218*     Recent Results (from the past 240 hour(s))  Urine Culture     Status: Abnormal   Collection Time: 05/31/21  5:16 PM   Specimen: Urine, Clean Catch  Result Value Ref Range Status   Specimen Description URINE, CLEAN CATCH  Final   Special Requests   Final    NONE Performed at [facility] Lab, [address] [place], [place]    Culture MULTIPLE SPECIES PRESENT, SUGGEST RECOLLECTION (Alicia Vang)  Final   Report Status 06/02/2021 FINAL  Final  Resp Panel by RT-PCR (Flu Alicia Vang&B, Covid) Nasopharyngeal Swab     Status: None   Collection Time: 06/01/21  3:01 AM   Specimen: Nasopharyngeal Swab; Nasopharyngeal(NP) swabs in vial transport medium  Result Value Ref Range Status   SARS Coronavirus 2 by RT PCR NEGATIVE NEGATIVE Final    Comment: (NOTE) SARS-CoV-2 target nucleic acids are NOT DETECTED.  The SARS-CoV-2 RNA is generally detectable in upper respiratory specimens during the acute phase of infection. The lowest concentration of SARS-CoV-2 viral copies this assay can detect is 138  copies/mL. Alicia Vang negative result does not preclude SARS-Cov-2 infection and should not be used as the sole basis for treatment or other patient management decisions. Alicia Vang negative result may occur with  improper specimen collection/handling, submission of specimen other than nasopharyngeal swab, presence of viral mutation(s) within the areas targeted by this assay, and inadequate number of viral copies(<138 copies/mL). Brysten Reister negative result must be combined with clinical observations, patient history, and epidemiological information. The expected result is  Negative.  Fact Sheet for Patients:  BloggerCourse.com  Fact Sheet for Healthcare Providers:  SeriousBroker.it  This test is no t yet approved or cleared by the [place] FDA and  has been authorized for detection and/or diagnosis of SARS-CoV-2 by FDA under an Emergency Use Authorization (EUA). This EUA will remain  in effect (meaning this test can be used) for the duration of the COVID-19 declaration under Section 564(b)(1) of the Act, 21 U.S.C.section 360bbb-3(b)(1), unless the authorization is terminated  or revoked sooner.       Influenza Emmert Roethler by PCR NEGATIVE NEGATIVE Final   Influenza B by PCR NEGATIVE NEGATIVE Final    Comment: (NOTE) The Xpert Xpress SARS-CoV-2/FLU/RSV plus assay is intended as an aid in the diagnosis of influenza from Nasopharyngeal swab specimens and should not be used as Knolan Simien sole basis for treatment. Nasal washings and aspirates are unacceptable for Xpert Xpress SARS-CoV-2/FLU/RSV testing.  Fact Sheet for Patients: BloggerCourse.com  Fact Sheet for Healthcare Providers: SeriousBroker.it  This test is not yet approved or cleared by the [place] FDA and has been authorized for detection and/or diagnosis of SARS-CoV-2 by FDA under an Emergency Use Authorization (EUA). This EUA will remain in effect (meaning  this test can be used) for the duration of the COVID-19 declaration under Section 564(b)(1) of the Act, 21 U.S.C. section 360bbb-3(b)(1), unless the authorization is terminated or revoked.  Performed at [facility], [address] [place], [place] [address]          Radiology Studies: MR LUMBAR SPINE WO CONTRAST  Result Date: 06/01/2021 CLINICAL DATA:  87 year old female status post fall last week. Lower thoracic and lumbar compression fractures on CT yesterday. EXAM: MRI LUMBAR SPINE WITHOUT CONTRAST TECHNIQUE: Multiplanar, multisequence MR imaging of the lumbar spine was performed. No intravenous contrast was administered. COMPARISON:  Lumbar spine CT yesterday. CT Abdomen and Pelvis 03/24/2021. FINDINGS: Segmentation:  Normal on the comparisons. Alignment: Stable straightening of lumbar lordosis. Mild anterolisthesis of L2 on L3 is stable since yesterday and measures 2-3 mm. Vertebrae: Moderate chronic T12 and mild L1 compression fractures appear stable since the June CT and are without convincing marrow edema. L3 superior endplate compression with up to 40% loss of central vertebral body height is associated with fracture fragments and marrow edema. Small volume of frank hematoma suspected in the anterior superior endplate. Heterogeneity in the adjacent L2-L3 disc is likely posttraumatic. As demonstrated yesterday the L3 pedicles and posterior elements appear to remain intact. L2, L4, L5, and the visible sacrum remain intact. Conus medullaris and cauda equina: Conus extends to the L2-L3 level, see details below. No signal abnormality in the lower thoracic spinal cord, with capacious spinal canal at most levels. Paraspinal and other soft tissues: Mild medial psoas muscle edema centered at L3. No paraspinal muscle fluid collection. Benign appearing hepatic cysts, and other stable visible abdominal viscera. Disc levels: No lower thoracic spinal stenosis through L2. L2-L3: Circumferential  disc bulge with mild retropulsion of the L3 posterosuperior endplate and mild to moderate posterior element hypertrophy combine for mild to moderate spinal stenosis at the level of the conus (series 6, image 17 and series 2, image 8). Up to mild conus mass effect but no conus signal abnormality. There is moderate associated L2 neural foraminal stenosis greater on the left. L3-L4: Degenerative appearing mild spinal stenosis with circumferential disc bulge, mild to moderate posterior element hypertrophy. Moderate bilateral L3 foraminal stenosis. No significant spinal stenosis in the lower lumbar spine. IMPRESSION:  1. Acute to subacute L3 compression fracture with up to 40% loss of central vertebral body height. Mild anterolisthesis of L2 on L3 and mild retropulsion of the posterosuperior endplate contribute to moderate spinal stenosis at the level of the conus medullaris. Up to mild conus mass effect but no conus or lower thoracic spinal cord signal abnormality. Moderate L2 foraminal stenosis. 2. No other acute osseous abnormality in the lumbar spine. Chronic T12 and L1 compression fractures. 3. Mild degenerative spinal stenosis at L3-L4 with moderate bilateral L3 foraminal stenosis. Electronically Signed   By: Odessa FlemingH  Hall M.D.   On: 06/01/2021 06:15        Scheduled Meds:  acetaminophen  1,000 mg Oral Q8H   brinzolamide  1 drop Both Eyes QHS   insulin aspart  0-9 Units Subcutaneous Q4H   latanoprost  1 drop Both Eyes QHS   lidocaine  1 patch Transdermal Q24H   losartan  100 mg Oral Daily   metoprolol succinate  50 mg Oral Daily   polyethylene glycol  17 g Oral BID   Continuous Infusions:  lactated ringers 75 mL/hr at 06/02/21 1157     LOS: 0 days    Time spent: over 30 min    Lacretia Nicksaldwell Powell, MD [facility]   To contact the attending provider between 7A-7P or the covering provider during after hours 7P-7A, please log into the web site [email or web address] and access using universal [facility] password for that web site. If you do not have the password, please call the hospital operator.  06/02/2021, 7:55 PM

## 2021-06-02 NOTE — Plan of Care (Signed)

## 2021-06-02 NOTE — TOC Initial Note (Signed)
Transition of Care [facility]) - Initial/Assessment Note    Patient Details  Name: Alicia Vang MRN: [id number] Date of Birth: 08-28-1927  Transition of [facility]) CM/SW Contact:    Baldemar Lenis, LCSW Phone Number: 06/02/2021, 11:03 AM  Clinical Narrative:       CSW spoke with patient's daughter Alicia Vang over the phone to discuss recommendation for SNF. Daughter agreeable to think about SNF, not sure if she wants to do that or not. Patient has been to [facility] before, that would be the first choice for placement. CSW sent out referral and will follow back up with daughter on disposition decision.            Expected Discharge Plan: Skilled Nursing Facility Barriers to Discharge: Continued Medical Work up, English as a second language teacher   Patient Goals and CMS Choice Patient states their goals for this hospitalization and ongoing recovery are:: patient unable to participate in goal setting, only oriented x 2 CMS Medicare.gov Compare Post Acute Care list provided to:: Patient Represenative (must comment) Choice offered to / list presented to : Adult Children  Expected Discharge Plan and Services Expected Discharge Plan: Skilled Nursing Facility     Post Acute Care Choice: NA Living arrangements for the past 2 months: Single Family Home                                      Prior Living Arrangements/Services Living arrangements for the past 2 months: Single Family Home Lives with:: Adult Children Patient language and need for interpreter reviewed:: No Do you feel safe going back to the place where you live?: Yes      Need for Family Participation in Patient Care: Yes (Comment) Care giver support system in place?: Yes (comment) Current home services: DME Criminal Activity/Legal Involvement Pertinent to Current Situation/Hospitalization: No - Comment as needed  Activities of Daily Living      Permission Sought/Granted Permission sought to share information with :  Facility Medical sales representative, Family Supports Permission granted to share information with : Yes, Verbal Permission Granted  Share Information with NAME: Alicia Vang  Permission granted to share info w AGENCY: SNF  Permission granted to share info w Relationship: Daughter     Emotional Assessment   Attitude/Demeanor/Rapport: Unable to Assess Affect (typically observed): Unable to Assess Orientation: : Oriented to Self, Oriented to Place Alcohol / Substance Use: Not Applicable Psych Involvement: No (comment)  Admission diagnosis:  Closed compression fracture of L3 lumbar vertebra, initial encounter [facility]) [S32.030A] Patient Active Problem List   Diagnosis Date Noted   Closed compression fracture of L3 lumbar vertebra, initial encounter (HCC) 06/01/2021   Parkinson's disease (HCC) 06/01/2021    Class: Question of   HTN (hypertension) 06/01/2021   COVID-19 virus infection 10/21/2020   Bradycardia 10/21/2020   Type 2 diabetes mellitus (HCC) 10/21/2020   Primary osteoarthritis of left hip 10/26/2017   Osteoarthritis of left hip 08/17/2017   PCP:  Knox Royalty, MD Pharmacy:   [id number] [place], [place] - [address] [place] [address] Phone: [phone] Fax: [phone]     Social Determinants of Health (SDOH) Interventions    Readmission Risk Interventions No flowsheet data found.

## 2021-06-02 NOTE — NC FL2 (Signed)
[place] MEDICAID FL2 LEVEL OF CARE SCREENING TOOL     IDENTIFICATION  Patient Name: Alicia Vang Birthdate: 1927/08/29 Sex: female Admission Date (Current Location): 05/31/2021  [facility] and [place] Number:  Producer, television/film/video and Address:  [facility], [address] [place], [place] [address]      Provider Number: [id number]  Attending Physician Name and Address:  Zigmund Daniel., *  Relative Name and Phone Number:       Current Level of Care: Hospital Recommended Level of Care: Skilled Nursing Facility Prior Approval Number:    Date Approved/Denied:   PASRR Number: [id number]  Discharge Plan: SNF    Current Diagnoses: Patient Active Problem List   Diagnosis Date Noted   Closed compression fracture of L3 lumbar vertebra, initial encounter (HCC) 06/01/2021   Parkinson's disease (HCC) 06/01/2021   HTN (hypertension) 06/01/2021   COVID-19 virus infection 10/21/2020   Bradycardia 10/21/2020   Type 2 diabetes mellitus (HCC) 10/21/2020   Primary osteoarthritis of left hip 10/26/2017   Osteoarthritis of left hip 08/17/2017    Orientation RESPIRATION BLADDER Height & Weight     Self, Place  Normal Continent Weight: 128 lb (58.1 kg) Height:  5\' 5"  (165.1 cm)  BEHAVIORAL SYMPTOMS/MOOD NEUROLOGICAL BOWEL NUTRITION STATUS      Continent Diet (carb modified)  AMBULATORY STATUS COMMUNICATION OF NEEDS Skin   Extensive Assist Verbally Normal                       Personal Care Assistance Level of Assistance  Bathing, Feeding, Dressing Bathing Assistance: Maximum assistance Feeding assistance: Maximum assistance Dressing Assistance: Maximum assistance     Functional Limitations Info  Sight Sight Info: Impaired (glaucoma)        SPECIAL CARE FACTORS FREQUENCY  PT (By licensed PT), OT (By licensed OT)     PT Frequency: 5x/wk OT Frequency: 5x/wk            Contractures Contractures Info: Not present     Additional Factors Info  Code Status, Allergies, Insulin Sliding Scale Code Status Info: Full Allergies Info: Amlodipine   Insulin Sliding Scale Info: see DC summary       Current Medications (06/02/2021):  This is the current hospital active medication list Current Facility-Administered Medications  Medication Dose Route Frequency Provider Last Rate Last Admin   acetaminophen (TYLENOL) tablet 1,000 mg  1,000 mg Oral Q8H 08/02/2021., MD   1,000 mg at 06/02/21 0503   Followed by   08/02/21 ON 06/04/2021] acetaminophen (TYLENOL) tablet 650 mg  650 mg Oral Q6H PRN 06/06/2021., MD       brinzolamide (AZOPT) 1 % ophthalmic suspension 1 drop  1 drop Both Eyes QHS Zigmund Daniel M, DO   1 drop at 06/01/21 2140   insulin aspart (novoLOG) injection 0-9 Units  0-9 Units Subcutaneous Q4H 2141, DO   2 Units at 06/02/21 08/02/21   lactated ringers infusion   Intravenous Continuous 7902, DO 75 mL/hr at 06/02/21 0635 Infusion Verify at 06/02/21 0635   latanoprost (XALATAN) 0.005 % ophthalmic solution 1 drop  1 drop Both Eyes QHS 08/02/21 M, DO   1 drop at 06/01/21 2142   lidocaine (LIDODERM) 5 % 1 patch  1 patch Transdermal Q24H 2143., MD   1 patch at 06/01/21 2143   metoprolol succinate (TOPROL-XL) 24 hr tablet 50 mg  50  mg Oral Daily Zigmund Daniel., MD   50 mg at 06/02/21 1020   morphine 2 MG/ML injection 1 mg  1 mg Intravenous Q4H PRN Zigmund Daniel., MD       ondansetron [facility]) tablet 4 mg  4 mg Oral Q6H PRN Hillary Bow, DO       Or   ondansetron [facility] injection 4 mg  4 mg Intravenous Q6H PRN Hillary Bow, DO       oxyCODONE (Oxy IR/ROXICODONE) immediate release tablet 2.5 mg  2.5 mg Oral Q4H PRN Zigmund Daniel., MD       Or   oxyCODONE (Oxy IR/ROXICODONE) immediate release tablet 5 mg  5 mg Oral Q4H PRN Zigmund Daniel., MD       polyethylene glycol [facility] / GLYCOLAX) packet 17 g  17 g Oral  BID Zigmund Daniel., MD   17 g at 06/02/21 1020     Discharge Medications: Please see discharge summary for a list of discharge medications.  Relevant Imaging Results:  Relevant Lab Results:   Additional Information SS#: [id number]  Baldemar Lenis, LCSW

## 2021-06-02 NOTE — Evaluation (Signed)
Occupational Therapy Evaluation Patient Details Name: Alicia Vang MRN: [id number] DOB: Feb 25, 1927 Today's Date: 06/02/2021    History of Present Illness 87 y.o. female presents to [facility] ED on 05/31/2021 with low back pain and inability to ambulate s/p fall at home. MRI on 8/10 showing acute to subacute L 3 compression fx. PMH includes OA, DMII, HTN, L THA (2019), and hypokalemia.   Clinical Impression   PTA, pt was living with her daughter and reports she was performing BADLs and using a "walker" for mobility. Pt currently requiring Max-Total A for bathing/dressing/toileting and Mod A for functional transfers. Despite pain and decreased activity tolerance, pt very agreeable to OOB to recliner. Pt presenting with decreased balance, strength, activity tolerance, and ROM. Pt would benefit from further acute OT to facilitate safe dc. Recommend dc to SNF for further OT to optimize safety, independence with ADLs, and return to PLOF.     Follow Up Recommendations  SNF    Equipment Recommendations  Other (comment) (Defer to next venue)    Recommendations for Other Services PT consult     Precautions / Restrictions Precautions Precautions: Fall;Back Precaution Booklet Issued: No Precaution Comments: verbally reviewed back precautions Required Braces or Orthoses: Spinal Brace Spinal Brace: Thoracolumbosacral orthotic;Other (comment) ("On when up" per MD order) Restrictions Weight Bearing Restrictions: No      Mobility Bed Mobility Overal bed mobility: Needs Assistance Bed Mobility: Rolling;Sidelying to Sit Rolling: Min assist Sidelying to sit: Mod assist       General bed mobility comments: Min A for bringing shoulder to L and then Mod A to elevate trunk    Transfers Overall transfer level: Needs assistance Equipment used: Rolling walker (2 wheeled) Transfers: Sit to/from [employer] Sit to Stand: Mod assist Stand pivot transfers: Mod assist        General transfer comment: Mod A for power up and then maintain balance ot take small steps towards recliner. Cues for recahing towards arm rests at recliner    Balance Overall balance assessment: Needs assistance Sitting-balance support: Single extremity supported;Feet supported Sitting balance-[name] Scale: Fair     Standing balance support: Bilateral upper extremity supported;During functional activity Standing balance-[name] Scale: Poor Standing balance comment: Reliant on phsyical A                           ADL either performed or assessed with clinical judgement   ADL Overall ADL's : Needs assistance/impaired Eating/Feeding: Minimal assistance;Sitting;Bed level   Grooming: Minimal assistance;Sitting;Bed level   Upper Body Bathing: Maximal assistance;Sitting   Lower Body Bathing: Total assistance;Sit to/from stand   Upper Body Dressing : Maximal assistance;Sitting Upper Body Dressing Details (indicate cue type and reason): Readjust brace Lower Body Dressing: Total assistance;Sit to/from stand Lower Body Dressing Details (indicate cue type and reason): don socks Toilet Transfer: Moderate assistance;Stand-pivot           Functional mobility during ADLs: Moderate assistance (stand pivot to recliner) General ADL Comments: Pt presenting with decreased strength, balance, and activity tolerance. Despite pain, pt agreeable to OOB     Vision         Perception     Praxis      Pertinent Vitals/Pain Pain Assessment: Faces Faces Pain Scale: Hurts a little bit Pain Location: generalized with mobility Pain Descriptors / Indicators: Grimacing Pain Intervention(s): Monitored during session;Limited activity within patient's tolerance;Repositioned     Hand Dominance Right   Extremity/Trunk Assessment Upper Extremity Assessment Upper  Extremity Assessment: Generalized weakness   Lower Extremity Assessment Lower Extremity Assessment: Generalized weakness    Cervical / Trunk Assessment Cervical / Trunk Assessment: Kyphotic;Other exceptions Cervical / Trunk Exceptions: back fx   Communication Communication Communication: HOH   Cognition Arousal/Alertness: Awake/alert Behavior During Therapy: WFL for tasks assessed/performed Overall Cognitive Status: No family/caregiver present to determine baseline cognitive functioning                                 General Comments: Following commands. Requiring increased time and cues; HOH. Very agreeable to OOB to recliner.   General Comments  Pt wearing TLSO supine in bed upon arrival. RN arriving at end of session; updating her that MD order is brace "on when up" and then brace can cause pressure wounds in bed.    Exercises     Shoulder Instructions      Home Living Family/patient expects to be discharged to:: Private residence Living Arrangements: Children Available Help at Discharge: Family;Available PRN/intermittently Type of Home: House Home Access: Stairs to enter [employer] of Steps: 3 Entrance Stairs-Rails: Left Home Layout: Two level;Bed/bath upstairs Alternate Level Stairs-Number of Steps: flight             Home Equipment: Walker - 4 wheels   Additional Comments: Pt quiet and HOH. Limited information about home set up      Prior Functioning/Environment Level of Independence: Needs assistance  Gait / Transfers Assistance Needed: pt reports ambulating with rollator ADL's / Homemaking Assistance Needed: pt reports she performs BADLs - "I can get dressed by myself"            OT Problem List: Decreased range of motion;Decreased activity tolerance;Decreased strength;Impaired balance (sitting and/or standing);Decreased knowledge of use of DME or AE;Decreased knowledge of precautions;Pain      OT Treatment/Interventions: Self-care/ADL training;Therapeutic exercise;Energy conservation;DME and/or AE instruction;Therapeutic activities;Patient/family  education;Balance training    OT Goals(Current goals can be found in the care plan section) Acute Rehab OT Goals Patient Stated Goal: "I want to get out of bed in a few minutes" OT Goal Formulation: With patient Time For Goal Achievement: 06/16/21 Potential to Achieve Goals: Good  OT Frequency: Min 2X/week   Barriers to D/C:            Co-evaluation              AM-PAC OT "6 Clicks" Daily Activity     Outcome Measure Help from another person eating meals?: A Little Help from another person taking care of personal grooming?: A Little Help from another person toileting, which includes using toliet, bedpan, or urinal?: A Lot Help from another person bathing (including washing, rinsing, drying)?: A Lot Help from another person to put on and taking off regular upper body clothing?: A Lot Help from another person to put on and taking off regular lower body clothing?: A Lot 6 Click Score: 14   End of Session Equipment Utilized During Treatment: Back brace;Gait belt Nurse Communication: Mobility status;Other (comment) (brace wear orders)  Activity Tolerance: Patient tolerated treatment well Patient left: in chair;with call bell/phone within reach;with chair alarm set;with nursing/sitter in room  OT Visit Diagnosis: Unsteadiness on feet (R26.81);Other abnormalities of gait and mobility (R26.89);Muscle weakness (generalized) (M62.81);Pain Pain - part of body:  (back)                Time: 7371-0626 OT Time Calculation (min): 21 min Charges:  OT  General Charges $OT Visit: 1 Visit OT Evaluation $OT Eval Moderate Complexity: 1 Mod  Alayja Armas MSOT, OTR/L Acute Rehab Pager: [phone] Office: [phone]  Theodoro Grist Shakyia Bosso 06/02/2021, 10:43 AM

## 2021-06-02 NOTE — Care Management Obs Status (Signed)
MEDICARE OBSERVATION STATUS NOTIFICATION   Patient Details  Name: Alicia Vang MRN: [id number] Date of Birth: 1927-08-03   Medicare Observation Status Notification Given:  Yes    Baldemar Lenis, LCSW 06/02/2021, 10:48 AM

## 2021-06-03 DIAGNOSIS — S32030A Wedge compression fracture of third lumbar vertebra, initial encounter for closed fracture: Secondary | ICD-10-CM | POA: Diagnosis not present

## 2021-06-03 LAB — GLUCOSE, CAPILLARY
Glucose-Capillary: 130 mg/dL — ABNORMAL HIGH (ref 70–99)
Glucose-Capillary: 139 mg/dL — ABNORMAL HIGH (ref 70–99)
Glucose-Capillary: 147 mg/dL — ABNORMAL HIGH (ref 70–99)
Glucose-Capillary: 161 mg/dL — ABNORMAL HIGH (ref 70–99)
Glucose-Capillary: 182 mg/dL — ABNORMAL HIGH (ref 70–99)
Glucose-Capillary: 199 mg/dL — ABNORMAL HIGH (ref 70–99)

## 2021-06-03 NOTE — Plan of Care (Signed)
Ate better with daughter feeding her. Remains with AMS. BLE weak. TSLO brace off while in bed. Turned from side to side every 2 hours and tolerating nit without issues. Accu checks every 4 hrs and continues to need coverage every time

## 2021-06-03 NOTE — Progress Notes (Signed)
Physical Therapy Treatment Patient Details Name: Alicia Vang MRN: [id number] DOB: 1927-04-30 Today's Date: 06/03/2021    History of Present Illness 87 y.o. female presents to [facility] ED on 05/31/2021 with low back pain and inability to ambulate s/p fall at home. MRI on 8/10 showing acute to subacute L 3 compression fx. PMH includes OA, DMII, HTN, L THA (2019), and hypokalemia.    PT Comments    Patient required modA for sit to stand transfer. Able to take pivotal steps towards bed with modA and RW. Patient requires step by step cues to transfer to bed. Patient continues to be limited by weakness. Continue to recommend SNF for ongoing Physical Therapy.       Follow Up Recommendations  SNF     Equipment Recommendations  Wheelchair (measurements PT)    Recommendations for Other Services       Precautions / Restrictions Precautions Precautions: Fall;Back Precaution Booklet Issued: No Precaution Comments: verbally reviewed back precautions Required Braces or Orthoses: Spinal Brace Spinal Brace: Thoracolumbosacral orthotic;Other (comment) ("On when up" per MD order) Restrictions Weight Bearing Restrictions: No    Mobility  Bed Mobility Overal bed mobility: Needs Assistance Bed Mobility: Rolling;Sit to Sidelying Rolling: Min assist       Sit to sidelying: Min assist General bed mobility comments: minA to roll L/R. Assist for bringing LEs onto bed    Transfers Overall transfer level: Needs assistance Equipment used: Rolling Alicia Vang (2 wheeled) Transfers: Sit to/from Stand           General transfer comment: modA to power up into standing and steady.  Ambulation/Gait Ambulation/Gait assistance: Mod assist Gait Distance (Feet): 2 Feet Assistive device: Rolling Alicia Vang (2 wheeled) Gait Pattern/deviations: Trunk flexed Gait velocity: decreased   General Gait Details: pivotal steps towards bed from recliner with modA for balance and RW management   Stairs              Wheelchair Mobility    Modified Rankin (Stroke Patients Only)       Balance Overall balance assessment: Needs assistance Sitting-balance support: Single extremity supported;Feet supported Sitting balance-Alicia Vang Scale: Fair     Standing balance support: Bilateral upper extremity supported;During functional activity Standing balance-Alicia Vang Scale: Poor Standing balance comment: reliant on UE support and external assist                            Cognition Arousal/Alertness: Awake/alert Behavior During Therapy: WFL for tasks assessed/performed Overall Cognitive Status: No family/caregiver present to determine baseline cognitive functioning                                 General Comments: Increased time to process cues but following commands. HOH      Exercises      General Comments        Pertinent Vitals/Pain Pain Assessment: Faces Faces Pain Scale: Hurts a little bit Pain Location: generalized with mobility Pain Descriptors / Indicators: Grimacing Pain Intervention(s): Monitored during session    Home Living                      Prior Function            PT Goals (current goals can now be found in the care plan section) Acute Rehab PT Goals Patient Stated Goal: "I want to go back to bed" PT Goal Formulation: With patient  Time For Goal Achievement: 06/15/21 Potential to Achieve Goals: Fair Progress towards PT goals: Progressing toward goals    Frequency    Min 3X/week (maintain 3x until SNF confirmed)      PT Plan Current plan remains appropriate    Co-evaluation              AM-PAC PT "6 Clicks" Mobility   Outcome Measure  Help needed turning from your back to your side while in a flat bed without using bedrails?: A Little Help needed moving from lying on your back to sitting on the side of a flat bed without using bedrails?: A Lot Help needed moving to and from a bed to a chair (including a  wheelchair)?: A Lot Help needed standing up from a chair using your arms (e.g., wheelchair or bedside chair)?: A Lot Help needed to walk in hospital room?: A Lot Help needed climbing 3-5 steps with a railing? : Total 6 Click Score: 12    End of Session Equipment Utilized During Treatment: Back brace;Gait belt Activity Tolerance: Patient tolerated treatment well Patient left: in bed;with call bell/phone within reach;with bed alarm set Nurse Communication: Mobility status PT Visit Diagnosis: Other abnormalities of gait and mobility (R26.89);Muscle weakness (generalized) (M62.81);History of falling (Z91.81)     Time: 2841-3244 PT Time Calculation (min) (ACUTE ONLY): 25 min  Charges:  $Therapeutic Activity: 23-37 mins                     Amon Costilla A. Dan Humphreys PT, DPT Acute Rehabilitation Services Pager [phone] Office [phone]    Viviann Spare 06/03/2021, 3:31 PM

## 2021-06-03 NOTE — TOC Progression Note (Signed)
Transition of Care [facility]) - Progression Note    Patient Details  Name: Alicia Vang MRN: [id number] Date of Birth: 1927/01/14  Transition of Care [facility]) CM/SW [place] Phone Number: 06/03/2021, 4:09 PM  Clinical Narrative:   CSW updated by Alicia Vang that the patient has an outstanding bill with them that will need settled prior to them being able to accept. CSW spoke with daughter Alicia Vang to provide update, and she has not been notified about a bill at all, said that the patient shouldn't have a bill with them. CSW met with Alicia Vang at bedside to provide other offers and discuss what Alicia Vang reported, and Alicia Vang said she was going to investigate what was going on with Alicia Vang as well as look at possible other SNF options. CSW spoke with Alicia Vang later, who said she had been on the phone with the patient's insurance company as well as Alicia Vang and that it sounds like Alicia Vang had applied the insurance check for the patient to someone else's account, and Alicia Vang is working on getting the issue straightened out. Alicia Vang would like patient to admit to Alicia Vang if possible, otherwise she will take the patient home. Alicia Vang still in communication with Alicia Vang to get the issue resolved and will update CSW with progress. CSW to follow.    Expected Discharge Plan: [place] Barriers to Discharge: Continued Medical Work up, Ship broker  Expected Discharge Plan and Services Expected Discharge Plan: [place]     Post Acute Care Choice: NA Living arrangements for the past 2 months: Single Family Home                                       Social Determinants of Health (SDOH) Interventions    Readmission Risk Interventions No flowsheet data found.

## 2021-06-03 NOTE — Progress Notes (Signed)
PROGRESS NOTE    Alicia Vang  XTG:[id number] DOB: [birth year] DOA: 05/31/2021 PCP: Knox Royalty, MD   Chief Complaint  Patient presents with   Back Pain   Fall    Brief Narrative:  Alicia Vang is Alicia Vang 87 y.o. female with medical history significant of DM2, HTN, probable PD.   Pt presents to ED with low back pain for the past week (last Thursday) and inability to ambulate following Alicia Vang mechanical fall at home.  Mechanical fall was down 1 step.   Pts daughter is her usual caregiver but has been out of town for past week.  Following fall, severe low back pain, inability to ambulate.  Pt been bed bound since fall.  Cared for by other family members.   After daughter got back in town, she brought pt to ED.   At baseline - pt was still ambulating around the street with Alicia Vang walker prior to fall.   Also report of AMS since fall, unknown if any head injury with fall.   No headache.   Pain worse with movement per patient.     ED Course: CT head and C spine are neg.   CT L spine demonstrates acute L3 compression fx.  Assessment & Plan:   Principal Problem:   Closed compression fracture of L3 lumbar vertebra, initial encounter [facility]) Active Problems:   Type 2 diabetes mellitus (HCC)   Parkinson's disease (HCC)   HTN (hypertension)  Mechanical Fall  Acute to Subacute L3 Compression Fracture MRI with acute to subacute L3 compression fx with up to 40% loss of central verebral body height. mild anterolisthesis of L2 on L3 and mild retropulsion of the posterosuperior endplate contribute to moderate spinal stenosis at the level of the conus medullaris.  Up to mild conus mass effect, but no conus or lower thoracic spinal cord signal abnormality.  Moderate L2 foraminal stenosis. (See report) PT/OT Neurosurgery recommending TLSO brace when up.  Conservative management with oral analgesia.  Outpatient follow up with NS in 3-4 weeks.  Scheduled APAP, oxy prn.  Morphine for  breakthrough. Plan for snf  T2DM SSI for now A1c 7.3  Hypertension Resume losartan and metoprolol  Chlorthalidone is given as needed at home BP higher now, she's asymptomatic, will ctm  Suspected Parkinson's Disease Ongoing tremor for past year, suspected PD Not on any meds at this point  Concern for atrial fibrillation Suspect related to fine tremor Follow repeat EKG and telemetry - sinus    DVT prophylaxis: SCD Code Status:full  Family Communication: daughter at bedside 8/10 Disposition:   Status is: Observation  The patient remains OBS appropriate and will d/c before 2 midnights.  Dispo: The patient is from: Home              Anticipated d/c is to: Home              Patient currently is not medically stable to d/c.   Difficult to place patient No       Consultants:  neurosurgery  Procedures:  none  Antimicrobials: = Anti-infectives (From admission, onward)    None          Subjective: No complaints  Objective: Vitals:   06/03/21 0428 06/03/21 0824 06/03/21 1146 06/03/21 1620  BP: (!) 151/85 (!) 179/80 (!) 171/72 (!) 164/78  Pulse: (!) 53 61 (!) 55 (!) 54  Resp: 17 18 16 18   Temp: 99.2 F (37.3 C)  98.3 F (36.8 C) 98.1 F (36.7 C)  TempSrc: Oral  Oral   SpO2: 98% 100% 100% 99%  Weight:      Height:        Intake/Output Summary (Last 24 hours) at 06/03/2021 1709 Last data filed at 06/03/2021 0653 Gross per 24 hour  Intake 1119.67 ml  Output 1150 ml  Net -30.33 ml   Filed Weights   05/31/21 1701  Weight: 58.1 kg    Examination:  General: No acute distress. Cardiovascular: RRRR Lungs: unlabored Abdomen: Soft, nontender, nondistended Neurological: Alert . Moves all extremities 4. Cranial nerves II through XII grossly intact. Skin: Warm and dry. No rashes or lesions. Extremities: No clubbing or cyanosis. No edema.    Data Reviewed: I have personally reviewed following labs and imaging studies  CBC: Recent Labs  Lab  05/31/21 1716 06/02/21 0133  WBC 7.0 4.3  NEUTROABS 5.5 3.0  HGB 11.8* 10.9*  HCT 37.0 34.0*  MCV 91.6 89.5  PLT 229 199    Basic Metabolic Panel: Recent Labs  Lab 05/31/21 1716 06/01/21 1859 06/02/21 0133  NA 134* 136 136  K 2.9* 3.5 3.3*  CL 94* 97* 99  CO2 28 28 28   GLUCOSE 300* 181* 148*  BUN 33* 22 21  CREATININE 1.15* 0.96 0.93  CALCIUM 9.7 9.4 9.2  MG  --   --  1.8  PHOS  --   --  3.1    GFR: Estimated Creatinine Clearance: 35.5 mL/min (by C-G formula based on SCr of 0.93 mg/dL).  Liver Function Tests: Recent Labs  Lab 05/31/21 1716 06/02/21 0133  AST 24 17  ALT 23 16  ALKPHOS 48 35*  BILITOT 1.2 0.9  PROT 7.8 6.4*  ALBUMIN 3.8 3.0*    CBG: Recent Labs  Lab 06/03/21 0001 06/03/21 0428 06/03/21 0825 06/03/21 1146 06/03/21 1620  GLUCAP 147* 139* 130* 161* 182*     Recent Results (from the past 240 hour(s))  Urine Culture     Status: Abnormal   Collection Time: 05/31/21  5:16 PM   Specimen: Urine, Clean Catch  Result Value Ref Range Status   Specimen Description URINE, CLEAN CATCH  Final   Special Requests   Final    NONE Performed at [facility], [address], [place], [place]    Culture MULTIPLE SPECIES PRESENT, SUGGEST RECOLLECTION (Allante Beane)  Final   Report Status 06/02/2021 FINAL  Final  Resp Panel by RT-PCR (Flu Colby Catanese&B, Covid) Nasopharyngeal Swab     Status: None   Collection Time: 06/01/21  3:01 AM   Specimen: Nasopharyngeal Swab; Nasopharyngeal(NP) swabs in vial transport medium  Result Value Ref Range Status   SARS Coronavirus 2 by RT PCR NEGATIVE NEGATIVE Final    Comment: (NOTE) SARS-CoV-2 target nucleic acids are NOT DETECTED.  The SARS-CoV-2 RNA is generally detectable in upper respiratory specimens during the acute phase of infection. The lowest concentration of SARS-CoV-2 viral copies this assay can detect is 138 copies/mL. Kalif Kattner negative result does not preclude SARS-Cov-2 infection and should not be used as the  sole basis for treatment or other patient management decisions. Seaborn Nakama negative result may occur with  improper specimen collection/handling, submission of specimen other than nasopharyngeal swab, presence of viral mutation(s) within the areas targeted by this assay, and inadequate number of viral copies(<138 copies/mL). Ezeriah Luty negative result must be combined with clinical observations, patient history, and epidemiological information. The expected result is Negative.  Fact Sheet for Patients:  08/01/21  Fact Sheet for Healthcare Providers:  BloggerCourse.com  This  test is no t yet approved or cleared by the [place] and  has been authorized for detection and/or diagnosis of SARS-CoV-2 by FDA under an Emergency Use Authorization (EUA). This EUA will remain  in effect (meaning this test can be used) for the duration of the COVID-19 declaration under Section 564(b)(1) of the Act, 21 U.S.C.section 360bbb-3(b)(1), unless the authorization is terminated  or revoked sooner.       Influenza Chrishelle Zito by PCR NEGATIVE NEGATIVE Final   Influenza B by PCR NEGATIVE NEGATIVE Final    Comment: (NOTE) The Xpert Xpress SARS-CoV-2/FLU/RSV plus assay is intended as an aid in the diagnosis of influenza from Nasopharyngeal swab specimens and should not be used as Nikkia Devoss sole basis for treatment. Nasal washings and aspirates are unacceptable for Xpert Xpress SARS-CoV-2/FLU/RSV testing.  Fact Sheet for Patients: BloggerCourse.com  Fact Sheet for Healthcare Providers: SeriousBroker.it  This test is not yet approved or cleared by the [place] FDA and has been authorized for detection and/or diagnosis of SARS-CoV-2 by FDA under an Emergency Use Authorization (EUA). This EUA will remain in effect (meaning this test can be used) for the duration of the COVID-19 declaration under Section 564(b)(1) of the  Act, 21 U.S.C. section 360bbb-3(b)(1), unless the authorization is terminated or revoked.  Performed at [facility], [address] [place], [place] [address]          Radiology Studies: No results found.      Scheduled Meds:  acetaminophen  1,000 mg Oral Q8H   brinzolamide  1 drop Both Eyes QHS   insulin aspart  0-9 Units Subcutaneous Q4H   latanoprost  1 drop Both Eyes QHS   lidocaine  1 patch Transdermal Q24H   losartan  100 mg Oral Daily   metoprolol succinate  50 mg Oral Daily   polyethylene glycol  17 g Oral BID   Continuous Infusions:  lactated ringers 75 mL/hr at 06/03/21 0128     LOS: 0 days    Time spent: over 30 min    Lacretia Nicks, MD [facility]   To contact the attending provider between 7A-7P or the covering provider during after hours 7P-7A, please log into the web site [email or web address] and access using universal [place] password for that web site. If you do not have the password, please call the hospital operator.  06/03/2021, 5:09 PM

## 2021-06-04 DIAGNOSIS — S32030A Wedge compression fracture of third lumbar vertebra, initial encounter for closed fracture: Secondary | ICD-10-CM | POA: Diagnosis not present

## 2021-06-04 LAB — BASIC METABOLIC PANEL
Anion gap: 10 (ref 5–15)
BUN: 12 mg/dL (ref 8–23)
CO2: 29 mmol/L (ref 22–32)
Calcium: 9.1 mg/dL (ref 8.9–10.3)
Chloride: 96 mmol/L — ABNORMAL LOW (ref 98–111)
Creatinine, Ser: 0.8 mg/dL (ref 0.44–1.00)
GFR, Estimated: >60 mL/min (ref >60)
Glucose, Bld: 142 mg/dL — ABNORMAL HIGH (ref 70–99)
Potassium: 3.5 mmol/L (ref 3.5–5.1)
Sodium: 135 mmol/L (ref 135–145)

## 2021-06-04 LAB — CBC
HCT: 34.1 % — ABNORMAL LOW (ref 36.0–46.0)
Hemoglobin: 11.2 g/dL — ABNORMAL LOW (ref 12.0–15.0)
MCH: 29.3 pg (ref 26.0–34.0)
MCHC: 32.8 g/dL (ref 30.0–36.0)
MCV: 89.3 fL (ref 80.0–100.0)
Platelets: 221 10*3/uL (ref 150–400)
RBC: 3.82 MIL/uL — ABNORMAL LOW (ref 3.87–5.11)
RDW: 13.6 % (ref 11.5–15.5)
WBC: 4.4 10*3/uL (ref 4.0–10.5)
nRBC: 0 % (ref 0.0–0.2)

## 2021-06-04 LAB — GLUCOSE, CAPILLARY
Glucose-Capillary: 132 mg/dL — ABNORMAL HIGH (ref 70–99)
Glucose-Capillary: 134 mg/dL — ABNORMAL HIGH (ref 70–99)
Glucose-Capillary: 147 mg/dL — ABNORMAL HIGH (ref 70–99)
Glucose-Capillary: 151 mg/dL — ABNORMAL HIGH (ref 70–99)
Glucose-Capillary: 167 mg/dL — ABNORMAL HIGH (ref 70–99)
Glucose-Capillary: 199 mg/dL — ABNORMAL HIGH (ref 70–99)

## 2021-06-04 MED ORDER — CHLORTHALIDONE 25 MG PO TABS
25.0000 mg | ORAL_TABLET | Freq: Every day | ORAL | Status: DC
  Administered 2021-06-04 – 2021-06-06 (×3): 25 mg via ORAL
  Filled 2021-06-04 (×3): qty 1

## 2021-06-04 NOTE — Progress Notes (Addendum)
PROGRESS NOTE    Alicia Vang  Alicia Vang:[id number] DOB: 03-26-1927 DOA: 05/31/2021 PCP: Alicia Royalty, MD   Chief Complaint  Patient presents with   Back Pain   Fall    Brief Narrative:  Alicia Vang is Alicia Vang 87 y.o. female with medical history significant of DM2, HTN, probable PD.   Pt presents to ED with low back pain for the past week (last Thursday) and inability to ambulate following Alicia Vang mechanical fall at home.  Mechanical fall was down 1 step.   Pts daughter is her usual caregiver but has been out of town for past week.  Following fall, severe low back pain, inability to ambulate.  Pt been bed bound since fall.  Cared for by other family members.   After daughter got back in town, she brought pt to ED.   At baseline - pt was still ambulating around the street with Alicia Vang walker prior to fall.   Also report of AMS since fall, unknown if any head injury with fall.   No headache.   Pain worse with movement per patient.     ED Course: CT head and C spine are neg.   CT L spine demonstrates acute L3 compression fx.  Assessment & Plan:   Principal Problem:   Closed compression fracture of L3 lumbar vertebra, initial encounter [facility]) Active Problems:   Type 2 diabetes mellitus (HCC)   Parkinson's disease (HCC)   HTN (hypertension)  Mechanical Fall  Acute to Subacute L3 Compression Fracture MRI with acute to subacute L3 compression fx with up to 40% loss of central verebral body height. mild anterolisthesis of L2 on L3 and mild retropulsion of the posterosuperior endplate contribute to moderate spinal stenosis at the level of the conus medullaris.  Up to mild conus mass effect, but no conus or lower thoracic spinal cord signal abnormality.  Moderate L2 foraminal stenosis. (See report) PT/OT Neurosurgery recommending TLSO brace when up.  Conservative management with oral analgesia.  Outpatient follow up with NS in 3-4 weeks.  Scheduled APAP, oxy prn.  Morphine for  breakthrough. Plan for snf  T2DM SSI for now A1c 7.3  Hypertension Resume losartan and metoprolol  Will start chlorthalidone with elevated pressures  Suspected Parkinson's Disease Ongoing tremor for past year, suspected PD Not on any meds at this point  Concern for atrial fibrillation Suspect related to fine tremor Follow repeat EKG and telemetry - sinus    DVT prophylaxis: SCD Code Status:full  Family Communication: son at bedside 8/13 Disposition:   Status is: Observation  The patient remains OBS appropriate and will d/c before 2 midnights.  Dispo: The patient is from: Home              Anticipated d/c is to: Home              Patient currently is not medically stable to d/c.   Difficult to place patient No       Consultants:  neurosurgery  Procedures:  none  Antimicrobials: = Anti-infectives (From admission, onward)    None          Subjective: No complaints  Objective: Vitals:   06/04/21 0350 06/04/21 0812 06/04/21 1242 06/04/21 1618  BP: (!) 140/108 (!) 163/67 124/66 (!) 185/79  Pulse: 69 60 (!) 59 (!) 57  Resp: 17   16  Temp: 98.2 F (36.8 C)   98.3 F (36.8 C)  TempSrc: Oral   Oral  SpO2: 100% 100% 97% 99%  Weight:  Height:       No intake or output data in the 24 hours ending 06/04/21 1740  Filed Weights   05/31/21 1701  Weight: 58.1 kg    Examination:  General: No acute distress. Cardiovascular: RRR. Lungs: unlabored Abdomen: Soft, nontender, nondistended  Neurological: Alert and oriented 3. Moves all extremities 4 . Cranial nerves II through XII grossly intact. Extremities: No clubbing or cyanosis. No edema  Data Reviewed: I have personally reviewed following labs and imaging studies  CBC: Recent Labs  Lab 05/31/21 1716 06/02/21 0133 06/04/21 0410  WBC 7.0 4.3 4.4  NEUTROABS 5.5 3.0  --   HGB 11.8* 10.9* 11.2*  HCT 37.0 34.0* 34.1*  MCV 91.6 89.5 89.3  PLT 229 199 221    Basic Metabolic  Panel: Recent Labs  Lab 05/31/21 1716 06/01/21 1859 06/02/21 0133 06/04/21 0410  NA 134* 136 136 135  K 2.9* 3.5 3.3* 3.5  CL 94* 97* 99 96*  CO2 28 28 28 29   GLUCOSE 300* 181* 148* 142*  BUN 33* 22 21 12   CREATININE 1.15* 0.96 0.93 0.80  CALCIUM 9.7 9.4 9.2 9.1  MG  --   --  1.8  --   PHOS  --   --  3.1  --     GFR: Estimated Creatinine Clearance: 41.2 mL/min (by C-G formula based on SCr of 0.8 mg/dL).  Liver Function Tests: Recent Labs  Lab 05/31/21 1716 06/02/21 0133  AST 24 17  ALT 23 16  ALKPHOS 48 35*  BILITOT 1.2 0.9  PROT 7.8 6.4*  ALBUMIN 3.8 3.0*    CBG: Recent Labs  Lab 06/04/21 0011 06/04/21 0413 06/04/21 0812 06/04/21 1244 06/04/21 1616  GLUCAP 147* 132* 134* 199* 167*     Recent Results (from the past 240 hour(s))  Urine Culture     Status: Abnormal   Collection Time: 05/31/21  5:16 PM   Specimen: Urine, Clean Catch  Result Value Ref Range Status   Specimen Description URINE, CLEAN CATCH  Final   Special Requests   Final    NONE Performed at [facility], [address] [place], [address] [place]    Culture MULTIPLE SPECIES PRESENT, SUGGEST RECOLLECTION (Alicia Vang)  Final   Report Status 06/02/2021 FINAL  Final  Resp Panel by RT-PCR (Alicia Vang, Covid) Nasopharyngeal Swab     Status: None   Collection Time: 06/01/21  3:01 AM   Specimen: Nasopharyngeal Swab; Nasopharyngeal(NP) swabs in vial transport medium  Result Value Ref Range Status   SARS Coronavirus 2 by RT PCR NEGATIVE NEGATIVE Final    Comment: (NOTE) SARS-CoV-2 target nucleic acids are NOT DETECTED.  The SARS-CoV-2 RNA is generally detectable in upper respiratory specimens during the acute phase of infection. The lowest concentration of SARS-CoV-2 viral copies this assay can detect is 138 copies/mL. Alicia Vang negative result does not preclude SARS-Cov-2 infection and should not be used as the sole basis for treatment or other patient management decisions. Alicia Vang negative result may occur  with  improper specimen collection/handling, submission of specimen other than nasopharyngeal swab, presence of viral mutation(s) within the areas targeted by this assay, and inadequate number of viral copies(<138 copies/mL). Aylissa Heinemann negative result must be combined with clinical observations, patient history, and epidemiological information. The expected result is Negative.  Fact Sheet for Patients:  08/02/2021  Fact Sheet for Healthcare Providers:  08/01/21  This test is no t yet approved or cleared by the [employer] and  has been authorized  for detection and/or diagnosis of SARS-CoV-2 by FDA under an Emergency Use Authorization (EUA). This EUA will remain  in effect (meaning this test can be used) for the duration of the COVID-19 declaration under Section 564(b)(1) of the Act, 21 U.S.C.section 360bbb-3(b)(1), unless the authorization is terminated  or revoked sooner.       Influenza Chasmine Lender by PCR NEGATIVE NEGATIVE Final   Influenza B by PCR NEGATIVE NEGATIVE Final    Comment: (NOTE) The Xpert Xpress SARS-CoV-2/Alicia/RSV plus assay is intended as an aid in the diagnosis of influenza from Nasopharyngeal swab specimens and should not be used as Mikyla Schachter sole basis for treatment. Nasal washings and aspirates are unacceptable for Xpert Xpress SARS-CoV-2/Alicia/RSV testing.  Fact Sheet for Patients: BloggerCourse.com  Fact Sheet for Healthcare Providers: SeriousBroker.it  This test is not yet approved or cleared by the [place] FDA and has been authorized for detection and/or diagnosis of SARS-CoV-2 by FDA under an Emergency Use Authorization (EUA). This EUA will remain in effect (meaning this test can be used) for the duration of the COVID-19 declaration under Section 564(b)(1) of the Act, 21 U.S.C. section 360bbb-3(b)(1), unless the authorization is terminated  or revoked.  Performed at [facility], [address], [place], [place] [address]          Radiology Studies: No results found.      Scheduled Meds:  acetaminophen  1,000 mg Oral Q8H   brinzolamide  1 drop Both Eyes QHS   insulin aspart  0-9 Units Subcutaneous Q4H   latanoprost  1 drop Both Eyes QHS   lidocaine  1 patch Transdermal Q24H   losartan  100 mg Oral Daily   metoprolol succinate  50 mg Oral Daily   polyethylene glycol  17 g Oral BID   Continuous Infusions:  lactated ringers 75 mL/hr at 06/04/21 1153     LOS: 0 days    Time spent: over 30 min    Lacretia Nicks, MD [facility]   To contact the attending provider between 7A-7P or the covering provider during after hours 7P-7A, please log into the web site [email or web address] and access using universal [place] password for that web site. If you do not have the password, please call the hospital operator.  06/04/2021, 5:40 PM

## 2021-06-04 NOTE — TOC Progression Note (Addendum)
Transition of Care [facility]) - Progression Note    Patient Details  Name: Alicia Vang MRN: [id number] Date of Birth: 03/24/1927  Transition [facility]) CM/SW Contact  Carley Hammed, [place] Phone Number: 06/04/2021, 10:21 AM  Clinical Narrative:    CSW followed up with pt's daughter to discuss disposition. At this time, family is leaning towards taking pt home, but are considering SNF options as well. Daughter asked that she be given time to discuss with her family and will follow back up with their plan. CSW will continue to follow.   2:00pm. CSW followed up to see if a decision had been made, a voicemail was left.  3:00Pm CSW called daughter again. She stated she was still looking at facilities to see if one would work, she stated she would not just place pt anywhere. She had one more to look at and will decide.  3:30 Pt's dtr stated that she thinks they will go with [facility], but wants to confirm with her Brother. CSW touched base, and they will not have a bed available until Monday. CSW will need to get insurance authorization.  4:10 Dtr confirmed [facility] is there choice, auth started, MD updated.  Expected Discharge Plan: Skilled Nursing Facility Barriers to Discharge: Continued Medical Work up, English as a second language teacher  Expected Discharge Plan and Services Expected Discharge Plan: Skilled Nursing Facility     Post Acute Care Choice: NA Living arrangements for the past 2 months: Single Family Home                                       Social Determinants of Health (SDOH) Interventions    Readmission Risk Interventions No flowsheet data found.

## 2021-06-04 NOTE — Plan of Care (Signed)
Patient is alert to self. Pt has purewick in place. Pt c/o back pain, prn oxycodone given with effective results.   Problem: Education: Goal: Knowledge of General Education information will improve Description: Including pain rating scale, medication(s)/side effects and non-pharmacologic comfort measures Outcome: Progressing   Problem: Health Behavior/Discharge Planning: Goal: Ability to manage health-related needs will improve Outcome: Progressing   Problem: Clinical Measurements: Goal: Ability to maintain clinical measurements within normal limits will improve Outcome: Progressing Goal: Will remain free from infection Outcome: Progressing Goal: Diagnostic test results will improve Outcome: Progressing Goal: Respiratory complications will improve Outcome: Progressing Goal: Cardiovascular complication will be avoided Outcome: Progressing   Problem: Activity: Goal: Risk for activity intolerance will decrease Outcome: Progressing   Problem: Nutrition: Goal: Adequate nutrition will be maintained Outcome: Progressing   Problem: Nutrition: Goal: Adequate nutrition will be maintained Outcome: Progressing   Problem: Coping: Goal: Level of anxiety will decrease Outcome: Progressing   Problem: Elimination: Goal: Will not experience complications related to bowel motility Outcome: Progressing Goal: Will not experience complications related to urinary retention Outcome: Progressing   Problem: Pain Managment: Goal: General experience of comfort will improve Outcome: Progressing   Problem: Safety: Goal: Ability to remain free from injury will improve Outcome: Progressing   Problem: Skin Integrity: Goal: Risk for impaired skin integrity will decrease Outcome: Progressing

## 2021-06-05 DIAGNOSIS — S32030A Wedge compression fracture of third lumbar vertebra, initial encounter for closed fracture: Secondary | ICD-10-CM | POA: Diagnosis not present

## 2021-06-05 LAB — CBC
HCT: 33.5 % — ABNORMAL LOW (ref 36.0–46.0)
Hemoglobin: 10.8 g/dL — ABNORMAL LOW (ref 12.0–15.0)
MCH: 29 pg (ref 26.0–34.0)
MCHC: 32.2 g/dL (ref 30.0–36.0)
MCV: 90.1 fL (ref 80.0–100.0)
Platelets: 217 10*3/uL (ref 150–400)
RBC: 3.72 MIL/uL — ABNORMAL LOW (ref 3.87–5.11)
RDW: 13.6 % (ref 11.5–15.5)
WBC: 4.3 10*3/uL (ref 4.0–10.5)
nRBC: 0 % (ref 0.0–0.2)

## 2021-06-05 LAB — GLUCOSE, CAPILLARY
Glucose-Capillary: 131 mg/dL — ABNORMAL HIGH (ref 70–99)
Glucose-Capillary: 132 mg/dL — ABNORMAL HIGH (ref 70–99)
Glucose-Capillary: 141 mg/dL — ABNORMAL HIGH (ref 70–99)
Glucose-Capillary: 150 mg/dL — ABNORMAL HIGH (ref 70–99)
Glucose-Capillary: 163 mg/dL — ABNORMAL HIGH (ref 70–99)
Glucose-Capillary: 164 mg/dL — ABNORMAL HIGH (ref 70–99)
Glucose-Capillary: 171 mg/dL — ABNORMAL HIGH (ref 70–99)

## 2021-06-05 LAB — BASIC METABOLIC PANEL
Anion gap: 10 (ref 5–15)
BUN: 14 mg/dL (ref 8–23)
CO2: 28 mmol/L (ref 22–32)
Calcium: 9.1 mg/dL (ref 8.9–10.3)
Chloride: 99 mmol/L (ref 98–111)
Creatinine, Ser: 0.85 mg/dL (ref 0.44–1.00)
GFR, Estimated: >60 mL/min (ref >60)
Glucose, Bld: 126 mg/dL — ABNORMAL HIGH (ref 70–99)
Potassium: 3.6 mmol/L (ref 3.5–5.1)
Sodium: 137 mmol/L (ref 135–145)

## 2021-06-05 MED ORDER — BISACODYL 10 MG RE SUPP
10.0000 mg | Freq: Once | RECTAL | Status: AC
  Administered 2021-06-05: 10 mg via RECTAL
  Filled 2021-06-05: qty 1

## 2021-06-05 NOTE — Progress Notes (Signed)
PROGRESS NOTE    GARLENE APPERSON  XLK:[id number] DOB: 04/12/1927 DOA: 05/31/2021 PCP: Knox Royalty, MD   Chief Complaint  Patient presents with   Back Pain   Fall    Brief Narrative:  DANARIA LARSEN is Arville Postlewaite 87 y.o. female with medical history significant of DM2, HTN, probable PD.   Pt presents to ED with low back pain for the past week (last Thursday) and inability to ambulate following Bita Cartwright mechanical fall at home.  Mechanical fall was down 1 step.   Pts daughter is her usual caregiver but has been out of town for past week.  Following fall, severe low back pain, inability to ambulate.  Pt been bed bound since fall.  Cared for by other family members.   After daughter got back in town, she brought pt to ED.   At baseline - pt was still ambulating around the street with Joshoa Shawler walker prior to fall.   Also report of AMS since fall, unknown if any head injury with fall.   No headache.   Pain worse with movement per patient.     ED Course: CT head and C spine are neg.   CT L spine demonstrates acute L3 compression fx.  Assessment & Plan:   Principal Problem:   Closed compression fracture of L3 lumbar vertebra, initial encounter [facility]) Active Problems:   Type 2 diabetes mellitus (HCC)   Parkinson's disease (HCC)   HTN (hypertension)  Mechanical Fall  Acute to Subacute L3 Compression Fracture MRI with acute to subacute L3 compression fx with up to 40% loss of central verebral body height. mild anterolisthesis of L2 on L3 and mild retropulsion of the posterosuperior endplate contribute to moderate spinal stenosis at the level of the conus medullaris.  Up to mild conus mass effect, but no conus or lower thoracic spinal cord signal abnormality.  Moderate L2 foraminal stenosis. (See report) PT/OT Neurosurgery recommending TLSO brace when up.  Conservative management with oral analgesia.  Outpatient follow up with NS in 3-4 weeks.  Scheduled APAP, oxy prn.  Morphine for  breakthrough. Plan for snf  T2DM SSI for now A1c 7.3  Hypertension Resume losartan and metoprolol  Will start chlorthalidone with elevated pressures Continue to monitor, improved recently  Suspected Parkinson's Disease Ongoing tremor for past year, suspected PD Not on any meds at this point  Concern for atrial fibrillation Suspect related to fine tremor Follow repeat EKG and telemetry - sinus    DVT prophylaxis: SCD Code Status:full  Family Communication: son at bedside 8/13 Disposition:   Status is: Observation  The patient remains OBS appropriate and will d/c before 2 midnights.  Dispo: The patient is from: Home              Anticipated d/c is to: Home              Patient currently is not medically stable to d/c.   Difficult to place patient No       Consultants:  neurosurgery  Procedures:  none  Antimicrobials: = Anti-infectives (From admission, onward)    None          Subjective: Asking for brace to be adjusted  Objective: Vitals:   06/05/21 0351 06/05/21 0815 06/05/21 1157 06/05/21 1540  BP: (!) 177/92 (!) 180/64 (!) 190/89 (!) 141/82  Pulse: (!) 56 (!) 52 76 65  Resp: 17 18 18 18   Temp: 98 F (36.7 C) 98.2 F (36.8 C) 98.2 F (36.8 C) 98.2 F (  36.8 C)  TempSrc:   Oral   SpO2: 99% 100% 100% (!) 80%  Weight:      Height:        Intake/Output Summary (Last 24 hours) at 06/05/2021 1656 Last data filed at 06/05/2021 1007 Gross per 24 hour  Intake 320 ml  Output 1850 ml  Net -1530 ml    Filed Weights   05/31/21 1701  Weight: 58.1 kg    Examination:  General: No acute distress. Cardiovascular: RRR Lungs: unlabored Abdomen: Soft, nontender, nondistended  TLSO brace on Neurological: Alert and oriented 3. Moves all extremities 4 . Cranial nerves II through XII grossly intact. Skin: Warm and dry. No rashes or lesions. Extremities: No clubbing or cyanosis. No edema.    Data Reviewed: I have personally reviewed following  labs and imaging studies  CBC: Recent Labs  Lab 05/31/21 1716 06/02/21 0133 06/04/21 0410 06/05/21 0355  WBC 7.0 4.3 4.4 4.3  NEUTROABS 5.5 3.0  --   --   HGB 11.8* 10.9* 11.2* 10.8*  HCT 37.0 34.0* 34.1* 33.5*  MCV 91.6 89.5 89.3 90.1  PLT 229 199 221 217    Basic Metabolic Panel: Recent Labs  Lab 05/31/21 1716 06/01/21 1859 06/02/21 0133 06/04/21 0410 06/05/21 0355  NA 134* 136 136 135 137  K 2.9* 3.5 3.3* 3.5 3.6  CL 94* 97* 99 96* 99  CO2 28 28 28 29 28   GLUCOSE 300* 181* 148* 142* 126*  BUN 33* 22 21 12 14   CREATININE 1.15* 0.96 0.93 0.80 0.85  CALCIUM 9.7 9.4 9.2 9.1 9.1  MG  --   --  1.8  --   --   PHOS  --   --  3.1  --   --     GFR: Estimated Creatinine Clearance: 38.8 mL/min (by C-G formula based on SCr of 0.85 mg/dL).  Liver Function Tests: Recent Labs  Lab 05/31/21 1716 06/02/21 0133  AST 24 17  ALT 23 16  ALKPHOS 48 35*  BILITOT 1.2 0.9  PROT 7.8 6.4*  ALBUMIN 3.8 3.0*    CBG: Recent Labs  Lab 06/05/21 0347 06/05/21 0813 06/05/21 1157 06/05/21 1438 06/05/21 1616  GLUCAP 131* 132* 164* 150* 171*     Recent Results (from the past 240 hour(s))  Urine Culture     Status: Abnormal   Collection Time: 05/31/21  5:16 PM   Specimen: Urine, Clean Catch  Result Value Ref Range Status   Specimen Description URINE, CLEAN CATCH  Final   Special Requests   Final    NONE Performed at [facility], [address], [place], [address] [place]    Culture MULTIPLE SPECIES PRESENT, SUGGEST RECOLLECTION (Makeyla Govan)  Final   Report Status 06/02/2021 FINAL  Final  Resp Panel by RT-PCR (Flu Jeanell Mangan&B, Covid) Nasopharyngeal Swab     Status: None   Collection Time: 06/01/21  3:01 AM   Specimen: Nasopharyngeal Swab; Nasopharyngeal(NP) swabs in vial transport medium  Result Value Ref Range Status   SARS Coronavirus 2 by RT PCR NEGATIVE NEGATIVE Final    Comment: (NOTE) SARS-CoV-2 target nucleic acids are NOT DETECTED.  The SARS-CoV-2 RNA is generally  detectable in upper respiratory specimens during the acute phase of infection. The lowest concentration of SARS-CoV-2 viral copies this assay can detect is 138 copies/mL. Marshayla Mitschke negative result does not preclude SARS-Cov-2 infection and should not be used as the sole basis for treatment or other patient management decisions. Tyleigh Mahn negative result may occur with  improper specimen collection/handling, submission of specimen other than nasopharyngeal swab, presence of viral mutation(s) within the areas targeted by this assay, and inadequate number of viral copies(<138 copies/mL). Uriah Philipson negative result must be combined with clinical observations, patient history, and epidemiological information. The expected result is Negative.  Fact Sheet for Patients:  BloggerCourse.com  Fact Sheet for Healthcare Providers:  SeriousBroker.it  This test is no t yet approved or cleared by the [place] FDA and  has been authorized for detection and/or diagnosis of SARS-CoV-2 by FDA under an Emergency Use Authorization (EUA). This EUA will remain  in effect (meaning this test can be used) for the duration of the COVID-19 declaration under Section 564(b)(1) of the Act, 21 U.S.C.section 360bbb-3(b)(1), unless the authorization is terminated  or revoked sooner.       Influenza Minoru Chap by PCR NEGATIVE NEGATIVE Final   Influenza B by PCR NEGATIVE NEGATIVE Final    Comment: (NOTE) The Xpert Xpress SARS-CoV-2/FLU/RSV plus assay is intended as an aid in the diagnosis of influenza from Nasopharyngeal swab specimens and should not be used as Tearah Saulsbury sole basis for treatment. Nasal washings and aspirates are unacceptable for Xpert Xpress SARS-CoV-2/FLU/RSV testing.  Fact Sheet for Patients: BloggerCourse.com  Fact Sheet for Healthcare Providers: SeriousBroker.it  This test is not yet approved or cleared by the [place] FDA  and has been authorized for detection and/or diagnosis of SARS-CoV-2 by FDA under an Emergency Use Authorization (EUA). This EUA will remain in effect (meaning this test can be used) for the duration of the COVID-19 declaration under Section 564(b)(1) of the Act, 21 U.S.C. section 360bbb-3(b)(1), unless the authorization is terminated or revoked.  Performed at [facility], [address], [place], [place] [address]          Radiology Studies: No results found.      Scheduled Meds:  brinzolamide  1 drop Both Eyes QHS   chlorthalidone  25 mg Oral Daily   insulin aspart  0-9 Units Subcutaneous Q4H   latanoprost  1 drop Both Eyes QHS   lidocaine  1 patch Transdermal Q24H   losartan  100 mg Oral Daily   metoprolol succinate  50 mg Oral Daily   polyethylene glycol  17 g Oral BID   Continuous Infusions:  lactated ringers 75 mL/hr at 06/05/21 1453     LOS: 0 days    Time spent: over 30 min    Lacretia Nicks, MD [facility]   To contact the attending provider between 7A-7P or the covering provider during after hours 7P-7A, please log into the web site [email or web address] and access using universal [place] password for that web site. If you do not have the password, please call the hospital operator.  06/05/2021, 4:56 PM

## 2021-06-05 NOTE — TOC Progression Note (Signed)
Transition of Care [facility]) - Progression Note    Patient Details  Name: Alicia Vang MRN: [id number] Date of Birth: 01/14/1927  Transition [facility] Care [facility]) CM/SW Contact  Carley Hammed, [place] Phone Number: 06/05/2021, 8:23 AM  Clinical Narrative:    CSW received authorization for SNF. Auth ID# [id number] Navi ID# [id number] 8/13-8/16. CSW will request covid test in preparation for DC.    Expected Discharge Plan: Skilled Nursing Facility Barriers to Discharge: Continued Medical Work up, English as a second language teacher  Expected Discharge Plan and Services Expected Discharge Plan: Skilled Nursing Facility     Post Acute Care Choice: NA Living arrangements for the past 2 months: Single Family Home                                       Social Determinants of Health (SDOH) Interventions    Readmission Risk Interventions No flowsheet data found.

## 2021-06-06 DIAGNOSIS — S32030A Wedge compression fracture of third lumbar vertebra, initial encounter for closed fracture: Secondary | ICD-10-CM | POA: Diagnosis not present

## 2021-06-06 LAB — GLUCOSE, CAPILLARY
Glucose-Capillary: 133 mg/dL — ABNORMAL HIGH (ref 70–99)
Glucose-Capillary: 135 mg/dL — ABNORMAL HIGH (ref 70–99)
Glucose-Capillary: 139 mg/dL — ABNORMAL HIGH (ref 70–99)
Glucose-Capillary: 141 mg/dL — ABNORMAL HIGH (ref 70–99)
Glucose-Capillary: 149 mg/dL — ABNORMAL HIGH (ref 70–99)
Glucose-Capillary: 163 mg/dL — ABNORMAL HIGH (ref 70–99)

## 2021-06-06 LAB — BASIC METABOLIC PANEL
Anion gap: 8 (ref 5–15)
BUN: 12 mg/dL (ref 8–23)
CO2: 27 mmol/L (ref 22–32)
Calcium: 9.4 mg/dL (ref 8.9–10.3)
Chloride: 99 mmol/L (ref 98–111)
Creatinine, Ser: 0.87 mg/dL (ref 0.44–1.00)
GFR, Estimated: >60 mL/min (ref >60)
Glucose, Bld: 144 mg/dL — ABNORMAL HIGH (ref 70–99)
Potassium: 3.6 mmol/L (ref 3.5–5.1)
Sodium: 134 mmol/L — ABNORMAL LOW (ref 135–145)

## 2021-06-06 LAB — CBC
HCT: 34.9 % — ABNORMAL LOW (ref 36.0–46.0)
Hemoglobin: 11.3 g/dL — ABNORMAL LOW (ref 12.0–15.0)
MCH: 28.7 pg (ref 26.0–34.0)
MCHC: 32.4 g/dL (ref 30.0–36.0)
MCV: 88.6 fL (ref 80.0–100.0)
Platelets: 239 10*3/uL (ref 150–400)
RBC: 3.94 MIL/uL (ref 3.87–5.11)
RDW: 13.4 % (ref 11.5–15.5)
WBC: 4.8 10*3/uL (ref 4.0–10.5)
nRBC: 0 % (ref 0.0–0.2)

## 2021-06-06 LAB — SARS CORONAVIRUS 2 (TAT 6-24 HRS): SARS Coronavirus 2: NEGATIVE

## 2021-06-06 MED ORDER — CHLORTHALIDONE 25 MG PO TABS
25.0000 mg | ORAL_TABLET | Freq: Every day | ORAL | 0 refills | Status: DC
Start: 2021-06-06 — End: 2022-07-06

## 2021-06-06 MED ORDER — DICLOFENAC SODIUM 1 % EX GEL
2.0000 g | Freq: Four times a day (QID) | CUTANEOUS | Status: DC | PRN
  Filled 2021-06-06: qty 100

## 2021-06-06 MED ORDER — DICLOFENAC SODIUM 1 % EX GEL: 2.0000 g | Freq: Four times a day (QID) | CUTANEOUS | 0 refills | Status: DC | PRN

## 2021-06-06 MED ORDER — COVID-19 MRNA VAC-TRIS(PFIZER) 30 MCG/0.3ML IM SUSP
0.3000 mL | Freq: Once | INTRAMUSCULAR | Status: AC
  Administered 2021-06-06: 0.3 mL via INTRAMUSCULAR
  Filled 2021-06-06: qty 0.3

## 2021-06-06 MED ORDER — POTASSIUM CHLORIDE ER 20 MEQ PO TBCR: 20.0000 meq | EXTENDED_RELEASE_TABLET | Freq: Every day | ORAL | Status: DC

## 2021-06-06 MED ORDER — ACETAMINOPHEN 325 MG PO TABS: 650.0000 mg | ORAL_TABLET | ORAL | 2 refills | Status: AC | PRN

## 2021-06-06 MED ORDER — POLYETHYLENE GLYCOL 3350 17 G PO PACK: 17.0000 g | PACK | Freq: Two times a day (BID) | ORAL | 0 refills | Status: DC

## 2021-06-06 NOTE — Progress Notes (Signed)
Physical Therapy Treatment Patient Details Name: Alicia Vang MRN: [id number] DOB: [birth year] Today's Date: 06/06/2021    History of Present Illness 87 y.o. female presents to [facility] ED on 05/31/2021 with low back pain and inability to ambulate s/p fall at home. MRI on 8/10 showing acute to subacute L 3 compression fx. PMH includes OA, DMII, HTN, L THA (2019), and hypokalemia.    PT Comments    Pt received in bed. She required mod assist bed mobility, +2 mod assist sit to stand, and mod assist amb 2' with RW +2 safety. Heavy posterior lean with initial stance, requiring increased time and assist to stabilize balance. Pt fatigues quickly. Pt in recliner with feet elevated at end of session.    Follow Up Recommendations  SNF     Equipment Recommendations  Wheelchair (measurements PT)    Recommendations for Other Services       Precautions / Restrictions Precautions Precautions: Fall;Back Precaution Comments: verbally reviewed back precautions Required Braces or Orthoses: Spinal Brace Spinal Brace: Thoracolumbosacral orthotic;Applied in sitting position    Mobility  Bed Mobility Overal bed mobility: Needs Assistance Bed Mobility: Rolling;Sit to Sidelying Rolling: Min assist Sidelying to sit: Mod assist       General bed mobility comments: assist with BLE and to elevate trunk, increased time, cues for sequencing    Transfers Overall transfer level: Needs assistance Equipment used: Rolling walker (2 wheeled) Transfers: Sit to/from Stand Sit to Stand: +2 physical assistance;+2 safety/equipment;Mod assist Stand pivot transfers: Mod assist;+2 safety/equipment       General transfer comment: +2 mod to power up and stabilize balance, heavy posterior lean with initial stance requiring increased time to stabilize balance. Once balance stabilized, +1 mod assist to take pivot steps with RW. +2 to retrieve recliner and position behind pt.  Ambulation/Gait Ambulation/Gait  assistance: Mod assist Gait Distance (Feet): 2 Feet Assistive device: Rolling walker (2 wheeled) Gait Pattern/deviations: Trunk flexed;Decreased step length - right;Decreased step length - left     General Gait Details: pivot steps with RW for transition to recliner   Stairs             Wheelchair Mobility    Modified Rankin (Stroke Patients Only)       Balance Overall balance assessment: Needs assistance Sitting-balance support: Single extremity supported;Bilateral upper extremity supported;Feet supported Sitting balance-[name] Scale: Fair Sitting balance - Comments: reliant on unilateral UE support   Standing balance support: Bilateral upper extremity supported;During functional activity Standing balance-[name] Scale: Poor Standing balance comment: reliant on UE support and external assist                            Cognition Arousal/Alertness: Awake/alert Behavior During Therapy: WFL for tasks assessed/performed Overall Cognitive Status: No family/caregiver present to determine baseline cognitive functioning                                 General Comments: increased processing time but following commands consistently. HOH      Exercises      General Comments        Pertinent Vitals/Pain Pain Assessment: Faces Faces Pain Scale: Hurts little more Pain Location: generalized with mobility Pain Descriptors / Indicators: Grimacing;Discomfort Pain Intervention(s): Limited activity within patient's tolerance;Repositioned;Monitored during session    Home Living  Prior Function            PT Goals (current goals can now be found in the care plan section) Acute Rehab PT Goals Patient Stated Goal: not stated Progress towards PT goals: Progressing toward goals    Frequency    Min 3X/week      PT Plan Current plan remains appropriate    Co-evaluation              AM-PAC PT "6 Clicks"  Mobility   Outcome Measure  Help needed turning from your back to your side while in a flat bed without using bedrails?: A Little Help needed moving from lying on your back to sitting on the side of a flat bed without using bedrails?: A Lot Help needed moving to and from a bed to a chair (including a wheelchair)?: A Lot Help needed standing up from a chair using your arms (e.g., wheelchair or bedside chair)?: A Lot Help needed to walk in hospital room?: Total Help needed climbing 3-5 steps with a railing? : Total 6 Click Score: 11    End of Session Equipment Utilized During Treatment: Back brace;Gait belt Activity Tolerance: Patient tolerated treatment well Patient left: in chair;with call bell/phone within reach;with chair alarm set;with family/visitor present Nurse Communication: Mobility status PT Visit Diagnosis: Other abnormalities of gait and mobility (R26.89);Muscle weakness (generalized) (M62.81);History of falling (Z91.81)     Time: 3016-0109 PT Time Calculation (min) (ACUTE ONLY): 26 min  Charges:  $Gait Training: 8-22 mins                     Aida Raider, PT  Office # [phone] Pager [phone]    Ilda Foil 06/06/2021, 10:46 AM

## 2021-06-06 NOTE — Discharge Summary (Addendum)
Physician Discharge Summary  Alicia Vang DJM:[id number] DOB: 1927-07-09 DOA: 05/31/2021  PCP: Knox Royalty, MD  Admit date: 05/31/2021 Discharge date: 06/06/2021  Time spent: 40 minutes  Recommendations for Outpatient Follow-up:  Follow outpatient CBC/CMP Follow with neurosurgery outpatient TLSO brace when up Follow blood pressure, up here - chlorthalidone transitioned from prn to scheduled - follow repeat labs within Alicia Vang week to follow k/creatinine Follow blood sugar for lows on sulfonylurea    Discharge Diagnoses:  Principal Problem:   Closed compression fracture of L3 lumbar vertebra, initial encounter [name]) Active Problems:   Type 2 diabetes mellitus (HCC)   Parkinson's disease (HCC)   HTN (hypertension)  Discharge Condition: stable  Diet recommendation: diabetic diet  Filed Weights   05/31/21 1701  Weight: 58.1 kg    History of present illness:  Alicia Vang is Alicia Vang 87 y.o. female with medical history significant of DM2, HTN, probable PD.   Pt presents to ED with low back pain for the past week (last Thursday) and inability to ambulate following Alicia Vang mechanical fall at home.  Mechanical fall was down 1 step.   Pts daughter is her usual caregiver but has been out of town for past week.  Following fall, severe low back pain, inability to ambulate.  Pt been bed bound since fall.  Cared for by other family members.   After daughter got back in town, she brought pt to ED.   At baseline - pt was still ambulating around the street with Alicia Vang walker prior to fall.   Also report of AMS since fall, unknown if any head injury with fall.   No headache.   Pain worse with movement per patient.     ED Course: CT head and C spine are neg.   CT L spine demonstrates acute L3 compression fx.  She was admitted after Alicia Vang mechanical fall with an L3 compression fracture.  Neurosurgery recommended TLSO brace.  D/c to SNF today.  Needs neurosurgery follow up.  Hospital Course:   Mechanical Fall  Acute to Subacute L3 Compression Fracture MRI with acute to subacute L3 compression fx with up to 40% loss of central verebral body height. mild anterolisthesis of L2 on L3 and mild retropulsion of the posterosuperior endplate contribute to moderate spinal stenosis at the level of the conus medullaris.  Up to mild conus mass effect, but no conus or lower thoracic spinal cord signal abnormality.  Moderate L2 foraminal stenosis. (See report) PT/OT Neurosurgery recommending TLSO brace when up.  Conservative management with oral analgesia.  Outpatient follow up with NS in 3-4 weeks.  Scheduled APAP, oxy prn.  Morphine for breakthrough. Plan for snf   T2DM Resume amaryl at discharge Follow blood sugars at SNF - watch for lows on sulfonylurea   Hypertension Resume losartan and metoprolol  Will start chlorthalidone with elevated pressures Continue to monitor, improved recently   Suspected Parkinson's Disease Ongoing tremor for past year, suspected PD Not on any meds at this point   Concern for atrial fibrillation Suspect related to tremor with above Follow repeat EKG and telemetry - sinus  Follow outpatient, no evidence of atrial fibrillation on my review of tele/EKGs  Plan for covid booster # 2 prior to discharge from SNF (last in 12/2020)- pfizer  Procedures: none  Consultations: neurosurgery  Discharge Exam: Vitals:   06/06/21 0022 06/06/21 0402  BP: (!) 165/75 (!) 143/78  Pulse:  71  Resp:  20  Temp: 98.2 F (36.8 C) 98 F (36.7 C)  SpO2: 100% 98%   Discussed d/c plan with Alicia Vang No new complaints  General: No acute distress. Cachetic - sitting up in chair in TLSO Cardiovascular: RRR. Lungs: unalbored Abdomen: Soft, nontender, nondistended  Neurological: Alert. Moves all extremities 4 . Cranial nerves II through XII grossly intact. Skin: Warm and dry. No rashes or lesions. Extremities: No clubbing or cyanosis. No edema.   Discharge  Instructions   Discharge Instructions     Call MD for:  difficulty breathing, headache or visual disturbances   Complete by: As directed    Call MD for:  extreme fatigue   Complete by: As directed    Call MD for:  hives   Complete by: As directed    Call MD for:  persistant dizziness or light-headedness   Complete by: As directed    Call MD for:  persistant nausea and vomiting   Complete by: As directed    Call MD for:  redness, tenderness, or signs of infection (pain, swelling, redness, odor or green/yellow discharge around incision site)   Complete by: As directed    Call MD for:  severe uncontrolled pain   Complete by: As directed    Call MD for:  temperature >100.4   Complete by: As directed    Diet - low sodium heart healthy   Complete by: As directed    Discharge instructions   Complete by: As directed    You were seen for Alicia Vang L3 compression fracture.  You were seen by neurosurgery and the plan is for Alicia Vang TLSO brace when you're up and about.  You should follow up with neurosurgery as an outpatient.  We'll discharge you to SNF today for rehab.  Your blood pressure as been up.  We've changed chlorthalidone to Alicia Vang scheduled medication.  Your potassium has been scheduled as well.  You should have repeat labs in about Alicia Vang week or so to ensure your kidney function and electrolytes are stable.  Watch your blood sugars on the amaryl.  This may need to be discontinued if you have low sugars.  Return for new, recurrent, or worsening symptoms.  Please ask your PCP to request records from this hospitalization so they know what was done and what the next steps will be.   Increase activity slowly   Complete by: As directed       Allergies as of 06/06/2021       Reactions   Amlodipine Swelling   Swelling in feet        Medication List     TAKE these medications    acetaminophen 325 MG tablet Commonly known as: TYLENOL Take 2 tablets (650 mg total) by mouth every 6 (six) hours  as needed for mild pain or headache (fever >/= 101). What changed: Another medication with the same name was added. Make sure you understand how and when to take each.   acetaminophen 325 MG tablet Commonly known as: Tylenol Take 2 tablets (650 mg total) by mouth every 4 (four) hours as needed. What changed: You were already taking Takyah Ciaramitaro medication with the same name, and this prescription was added. Make sure you understand how and when to take each.   brinzolamide 1 % ophthalmic suspension Commonly known as: AZOPT Place 1 drop into both eyes at bedtime.   chlorthalidone 25 MG tablet Commonly known as: HYGROTON Take 1 tablet (25 mg total) by mouth daily. What changed:  when to take this reasons to take this   diclofenac Sodium 1 %  Gel Commonly known as: VOLTAREN Apply 2 g topically 4 (four) times daily as needed (lower back pain as needed).   glimepiride 2 MG tablet Commonly known as: AMARYL Take 4 mg by mouth daily with breakfast.   latanoprost 0.005 % ophthalmic solution Commonly known as: XALATAN Place 1 drop into both eyes at bedtime.   losartan 100 MG tablet Commonly known as: COZAAR Take 100 mg by mouth daily.   Metoprolol Succinate 50 MG Cs24 Take 50 mg by mouth daily.   polyethylene glycol 17 g packet Commonly known as: MIRALAX / GLYCOLAX Take 17 g by mouth 2 (two) times daily.   Potassium Chloride ER 20 MEQ Tbcr Take 20 mEq by mouth daily. What changed:  when to take this reasons to take this   Xiidra 5 % Soln Generic drug: Lifitegrast Place 1 drop into both eyes at bedtime.       Allergies  Allergen Reactions   Amlodipine Swelling    Swelling in feet    Follow-up Information     Lisbeth Renshaw, MD Follow up.   Specialty: Neurosurgery Why: call for Tarnesha Ulloa follow up appointment Contact information: [address] [place] [address] [phone]         Zigmund Daniel., MD Follow up.   Specialty: Family  Medicine Contact information: [address] [place] [address] [place] [address] [phone]                  The results of significant diagnostics from this hospitalization (including imaging, microbiology, ancillary and laboratory) are listed below for reference.    Significant Diagnostic Studies: CT HEAD WO CONTRAST ( )  Result Date: 05/31/2021 CLINICAL DATA:  Unwitnessed fall EXAM: CT HEAD WITHOUT CONTRAST CT CERVICAL SPINE WITHOUT CONTRAST TECHNIQUE: Multidetector CT imaging of the head and cervical spine was performed following the standard protocol without intravenous contrast. Multiplanar CT image reconstructions of the cervical spine were also generated. COMPARISON:  CT brain 03/24/2021 FINDINGS: CT HEAD FINDINGS Brain: No acute territorial infarction, hemorrhage or intracranial mass. 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 Atrophy and chronic small vessel ischemic changes of the white matter Vascular: No hyperdense vessel.  No unexpected calcification Skull: Normal. Negative for fracture or focal lesion. Sinuses/Orbits: No acute finding. Other: None CT CERVICAL SPINE FINDINGS Alignment: No subluxation.  Facet alignment maintained Skull base and vertebrae: No acute fracture. No primary bone lesion or focal pathologic process. Soft tissues and spinal canal: No prevertebral fluid or swelling. No visible canal hematoma. Disc levels: Moderate severe diffuse degenerative change C3 through T1. Facet degenerative changes at multiple levels with foraminal stenosis. Upper chest: Negative. Other: None IMPRESSION: 1. No CT evidence for acute intracranial abnormality. Atrophy and chronic small vessel ischemic changes of the white matter 2. Degenerative changes of the cervical spine. No acute osseous abnormality Electronically Signed   By: Jasmine Pang M.D.   On: 05/31/2021 19:11   CT Cervical Spine Wo Contrast  Result Date: 05/31/2021 CLINICAL DATA:  Unwitnessed fall EXAM: CT HEAD WITHOUT  CONTRAST CT CERVICAL SPINE WITHOUT CONTRAST TECHNIQUE: Multidetector CT imaging of the head and cervical spine was performed following the standard protocol without intravenous contrast. Multiplanar CT image reconstructions of the cervical spine were also generated. COMPARISON:  CT brain 03/24/2021 FINDINGS: CT HEAD FINDINGS Brain: No acute territorial infarction, hemorrhage or intracranial mass. 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 Atrophy and chronic small vessel ischemic changes of the white matter Vascular: No hyperdense vessel.  No unexpected calcification Skull: Normal. Negative for fracture or focal lesion. Sinuses/Orbits: No acute finding. Other: None CT CERVICAL SPINE FINDINGS Alignment: No subluxation.  Facet alignment maintained Skull base and vertebrae: No acute fracture. No primary bone lesion or focal pathologic process. Soft tissues and spinal canal: No prevertebral fluid or swelling. No visible canal hematoma. Disc levels: Moderate severe diffuse degenerative change C3 through T1. Facet degenerative changes at multiple levels with foraminal stenosis. Upper chest: Negative. Other: None IMPRESSION: 1. No CT evidence for acute intracranial abnormality. Atrophy and chronic small vessel ischemic changes of the white matter 2. Degenerative changes of the cervical spine. No acute osseous abnormality Electronically Signed   By: Jasmine Pang M.D.   On: 05/31/2021 19:11   CT Lumbar Spine Wo Contrast  Result Date: 05/31/2021 CLINICAL DATA:  Back trauma, no prior imaging (Age >= 16y) EXAM: CT LUMBAR SPINE WITHOUT CONTRAST TECHNIQUE: Multidetector CT imaging of the lumbar spine was performed without intravenous contrast administration. Multiplanar CT image reconstructions were also generated. COMPARISON:  CT renal March 24, 2021. FINDINGS: Segmentation: Standard. Straightening. Slight anterolisthesis of L2 on L3. Alignment is similar to the prior. Alignment: Grade 1 anterolisthesis of L2 on L3.  Vertebrae: Acute L3 superior endplate fracture with approximately 40% height loss centrally and approximately 4 mm of bony retropulsion. There is Kaven Cumbie discrete lucency through the anterior/superior endplate and trabecular impaction. Prior T12 compression fracture with approximately 60% height loss, similar to March 24, 2021. Diffuse osteopenia. Paraspinal and other soft tissues: Aorta bi-iliac atherosclerosis. No evidence of acute abnormality. Disc levels: Moderate to severe degenerative disc disease at L4-L5 and L5-S1 where there is disc height loss, endplate sclerosis, vacuum disc phenomenon and disc bulging. Disc bulges at multiple levels with potentially moderate canal stenosis at L3-L4 and mild to moderate canal stenosis at L2-L3. Foraminal stenosis is likely greatest on the right at L5-S1. Severe facet arthropathy at multiple levels. IMPRESSION: 1. Acute L3 superior endplate fracture with approximately 40% height loss centrally and approximately 4 mm of bony retropulsion. 2. Prior T12 compression fracture with approximately 60% height loss, similar to March 24, 2021. 3. Moderate to severe degenerative disease at L4-L5 and L5-S1 and multilevel severe facet arthropathy. Potentially moderate canal stenosis at L3-L4 and potentially mild to moderate canal stenosis at L2-L3. An MRI of the lumbar spine could better characterize the canal and foramina if clinically indicated. 4. Diffuse osteopenia. Electronically Signed   By: Feliberto Harts MD   On: 05/31/2021 18:59   MR LUMBAR SPINE WO CONTRAST  Result Date: 06/01/2021 CLINICAL DATA:  87 year old female status post fall last week. Lower thoracic and lumbar compression fractures on CT yesterday. EXAM: MRI LUMBAR SPINE WITHOUT CONTRAST TECHNIQUE: Multiplanar, multisequence MR imaging of the lumbar spine was performed. No intravenous contrast was administered. COMPARISON:  Lumbar spine CT yesterday. CT Abdomen and Pelvis 03/24/2021. FINDINGS: Segmentation:  Normal on  the comparisons. Alignment: Stable straightening of lumbar lordosis. Mild anterolisthesis of L2 on L3 is stable since yesterday and measures 2-3 mm. Vertebrae: Moderate chronic T12 and mild L1 compression fractures appear stable since the June CT and are without convincing marrow edema. L3 superior endplate compression with up to 40% loss of central vertebral body height is associated with fracture fragments and marrow edema. Small volume of frank hematoma suspected in the anterior superior endplate. Heterogeneity in the adjacent L2-L3 disc is likely posttraumatic. As demonstrated yesterday the L3 pedicles and posterior elements appear to remain intact. L2, L4, L5, and the visible  sacrum remain intact. Conus medullaris and cauda equina: Conus extends to the L2-L3 level, see details below. No signal abnormality in the lower thoracic spinal cord, with capacious spinal canal at most levels. Paraspinal and other soft tissues: Mild medial psoas muscle edema centered at L3. No paraspinal muscle fluid collection. Benign appearing hepatic cysts, and other stable visible abdominal viscera. Disc levels: No lower thoracic spinal stenosis through L2. L2-L3: Circumferential disc bulge with mild retropulsion of the L3 posterosuperior endplate and mild to moderate posterior element hypertrophy combine for mild to moderate spinal stenosis at the level of the conus (series 6, image 17 and series 2, image 8). Up to mild conus mass effect but no conus signal abnormality. There is moderate associated L2 neural foraminal stenosis greater on the left. L3-L4: Degenerative appearing mild spinal stenosis with circumferential disc bulge, mild to moderate posterior element hypertrophy. Moderate bilateral L3 foraminal stenosis. No significant spinal stenosis in the lower lumbar spine. IMPRESSION: 1. Acute to subacute L3 compression fracture with up to 40% loss of central vertebral body height. Mild anterolisthesis of L2 on L3 and mild  retropulsion of the posterosuperior endplate contribute to moderate spinal stenosis at the level of the conus medullaris. Up to mild conus mass effect but no conus or lower thoracic spinal cord signal abnormality. Moderate L2 foraminal stenosis. 2. No other acute osseous abnormality in the lumbar spine. Chronic T12 and L1 compression fractures. 3. Mild degenerative spinal stenosis at L3-L4 with moderate bilateral L3 foraminal stenosis. Electronically Signed   By: Odessa FlemingH  Hall M.D.   On: 06/01/2021 06:15    Microbiology: Recent Results (from the past 240 hour(s))  Urine Culture     Status: Abnormal   Collection Time: 05/31/21  5:16 PM   Specimen: Urine, Clean Catch  Result Value Ref Range Status   Specimen Description URINE, CLEAN CATCH  Final   Special Requests   Final    NONE Performed at [facility], [address] [place], [place] [address]    Culture MULTIPLE SPECIES PRESENT, SUGGEST RECOLLECTION (Catha Ontko)  Final   Report Status 06/02/2021 FINAL  Final  Resp Panel by RT-PCR (Flu Zebulin Siegel&B, Covid) Nasopharyngeal Swab     Status: None   Collection Time: 06/01/21  3:01 AM   Specimen: Nasopharyngeal Swab; Nasopharyngeal(NP) swabs in vial transport medium  Result Value Ref Range Status   SARS Coronavirus 2 by RT PCR NEGATIVE NEGATIVE Final    Comment: (NOTE) SARS-CoV-2 target nucleic acids are NOT DETECTED.  The SARS-CoV-2 RNA is generally detectable in upper respiratory specimens during the acute phase of infection. The lowest concentration of SARS-CoV-2 viral copies this assay can detect is 138 copies/mL. Arelis Neumeier negative result does not preclude SARS-Cov-2 infection and should not be used as the sole basis for treatment or other patient management decisions. Lecia Esperanza negative result may occur with  improper specimen collection/handling, submission of specimen other than nasopharyngeal swab, presence of viral mutation(s) within the areas targeted by this assay, and inadequate number of viral copies(<138  copies/mL). Beckem Tomberlin negative result must be combined with clinical observations, patient history, and epidemiological information. The expected result is Negative.  Fact Sheet for Patients:  BloggerCourse.comhttps://www.fda.gov/media/152166/download  Fact Sheet for Healthcare Providers:  SeriousBroker.ithttps://www.fda.gov/media/152162/download  This test is no t yet approved or cleared by the [place] FDA and  has been authorized for detection and/or diagnosis of SARS-CoV-2 by FDA under an Emergency Use Authorization (EUA). This EUA will remain  in effect (meaning this test can be used)  for the duration of the COVID-19 declaration under Section 564(b)(1) of the Act, 21 U.S.C.section 360bbb-3(b)(1), unless the authorization is terminated  or revoked sooner.       Influenza Makayleigh Poliquin by PCR NEGATIVE NEGATIVE Final   Influenza B by PCR NEGATIVE NEGATIVE Final    Comment: (NOTE) The Xpert Xpress SARS-CoV-2/FLU/RSV plus assay is intended as an aid in the diagnosis of influenza from Nasopharyngeal swab specimens and should not be used as Chaundra Abreu sole basis for treatment. Nasal washings and aspirates are unacceptable for Xpert Xpress SARS-CoV-2/FLU/RSV testing.  Fact Sheet for Patients: BloggerCourse.com  Fact Sheet for Healthcare Providers: SeriousBroker.it  This test is not yet approved or cleared by the [place] FDA and has been authorized for detection and/or diagnosis of SARS-CoV-2 by FDA under an Emergency Use Authorization (EUA). This EUA will remain in effect (meaning this test can be used) for the duration of the COVID-19 declaration under Section 564(b)(1) of the Act, 21 U.S.C. section 360bbb-3(b)(1), unless the authorization is terminated or revoked.  Performed at [facility], [address], [place], [place] [address]   SARS CORONAVIRUS 2 (TAT 6-24 HRS) Nasopharyngeal Nasopharyngeal Swab     Status: None   Collection Time: 06/05/21  4:56 PM    Specimen: Nasopharyngeal Swab  Result Value Ref Range Status   SARS Coronavirus 2 NEGATIVE NEGATIVE Final    Comment: (NOTE) SARS-CoV-2 target nucleic acids are NOT DETECTED.  The SARS-CoV-2 RNA is generally detectable in upper and lower respiratory specimens during the acute phase of infection. Negative results do not preclude SARS-CoV-2 infection, do not rule out co-infections with other pathogens, and should not be used as the sole basis for treatment or other patient management decisions. Negative results must be combined with clinical observations, patient history, and epidemiological information. The expected result is Negative.  Fact Sheet for Patients: HairSlick.no  Fact Sheet for Healthcare Providers: quierodirigir.com  This test is not yet approved or cleared by the [place] FDA and  has been authorized for detection and/or diagnosis of SARS-CoV-2 by FDA under an Emergency Use Authorization (EUA). This EUA will remain  in effect (meaning this test can be used) for the duration of the COVID-19 declaration under Se ction 564(b)(1) of the Act, 21 U.S.C. section 360bbb-3(b)(1), unless the authorization is terminated or revoked sooner.  Performed at [facility], [address], [place], [place] [address]      Labs: Basic Metabolic Panel: Recent Labs  Lab 06/01/21 1859 06/02/21 0133 06/04/21 0410 06/05/21 0355 06/06/21 0309  NA 136 136 135 137 134*  K 3.5 3.3* 3.5 3.6 3.6  CL 97* 99 96* 99 99  CO2 28 28 29 28 27   GLUCOSE 181* 148* 142* 126* 144*  BUN 22 21 12 14 12   CREATININE 0.96 0.93 0.80 0.85 0.87  CALCIUM 9.4 9.2 9.1 9.1 9.4  MG  --  1.8  --   --   --   PHOS  --  3.1  --   --   --    Liver Function Tests: Recent Labs  Lab 05/31/21 1716 06/02/21 0133  AST 24 17  ALT 23 16  ALKPHOS 48 35*  BILITOT 1.2 0.9  PROT 7.8 6.4*  ALBUMIN 3.8 3.0*   No results for input(s): LIPASE, AMYLASE  in the last 168 hours. No results for input(s): AMMONIA in the last 168 hours. CBC: Recent Labs  Lab 05/31/21 1716 06/02/21 0133 06/04/21 0410 06/05/21 0355 06/06/21 0309  WBC  7.0 4.3 4.4 4.3 4.8  NEUTROABS 5.5 3.0  --   --   --   HGB 11.8* 10.9* 11.2* 10.8* 11.3*  HCT 37.0 34.0* 34.1* 33.5* 34.9*  MCV 91.6 89.5 89.3 90.1 88.6  PLT 229 199 221 217 239   Cardiac Enzymes: No results for input(s): CKTOTAL, CKMB, CKMBINDEX, TROPONINI in the last 168 hours. BNP: BNP (last 3 results) No results for input(s): BNP in the last 8760 hours.  ProBNP (last 3 results) No results for input(s): PROBNP in the last 8760 hours.  CBG: Recent Labs  Lab 06/05/21 1616 06/05/21 2030 06/06/21 0021 06/06/21 0349 06/06/21 0802  GLUCAP 171* 163* 133* 149* 135*       Signed:  Lacretia Nicks MD.  [facility] 06/06/2021, 12:02 PM

## 2021-06-06 NOTE — Progress Notes (Signed)
Gave report to Minitra nurse at [facility]

## 2021-06-06 NOTE — TOC Transition Note (Signed)
Transition of Care [facility]) - CM/SW Discharge Note   Patient Details  Name: Alicia Vang MRN: [id number] Date of Birth: [birth year]  [facility] CM/SW Contact:  Baldemar Lenis, LCSW Phone Number: 06/06/2021, 3:05 PM   Clinical Narrative:   Nurse to call report to [phone], Rm [id number].    Final next level of care: Skilled Nursing Facility Barriers to Discharge: Barriers Resolved   Patient Goals and CMS Choice Patient states their goals for this hospitalization and ongoing recovery are:: patient unable to participate in goal setting, only oriented x 2 CMS Medicare.gov Compare Post Acute Care list provided to:: Patient Represenative (must comment) Choice offered to / list presented to : Adult Children  Discharge Placement              Patient [facility] bed at: [facility] Patient to be transferred to facility by: [facility] Name of family member notified: [name] Patient and family notified of of transfer: 06/06/21  Discharge Plan and Services     Post Acute Care Choice: NA                               Social Determinants of Health (SDOH) Interventions     Readmission Risk Interventions No flowsheet data found.

## 2021-06-06 NOTE — Progress Notes (Signed)
Occupational Therapy Treatment Patient Details Name: Alicia Vang MRN: [id number] DOB: 01-May-1927 Today's Date: 06/06/2021    History of present illness 87 y.o. female presents to [facility] ED on 05/31/2021 with low back pain and inability to ambulate s/p fall at home. MRI on 8/10 showing acute to subacute L 3 compression fx. PMH includes OA, DMII, HTN, L THA (2019), and hypokalemia.   OT comments  Pt progressing towards established OT goals. Pt performing bed mobility with Min-Mod A for performing log roll. Requiring Max A to don brace while sitting at EOB. Pt performing sit<>stand with Mod A +2 and then pivot to recliner. Continue to recommend dc to SNF and will continue to follow acutely as admitted.   Follow Up Recommendations  SNF    Equipment Recommendations  Other (comment) (Defer to next venue)    Recommendations for Other Services PT consult    Precautions / Restrictions Precautions Precautions: Fall;Back Precaution Booklet Issued: No Precaution Comments: verbally reviewed back precautions Required Braces or Orthoses: Spinal Brace Spinal Brace: Thoracolumbosacral orthotic;Applied in sitting position       Mobility Bed Mobility Overal bed mobility: Needs Assistance Bed Mobility: Rolling;Sit to Sidelying Rolling: Min assist Sidelying to sit: Mod assist       General bed mobility comments: assist with BLE and to elevate trunk, increased time, cues for sequencing    Transfers Overall transfer level: Needs assistance Equipment used: Rolling walker (2 wheeled) Transfers: Sit to/from Stand Sit to Stand: +2 physical assistance;+2 safety/equipment;Mod assist Stand pivot transfers: Mod assist;+2 safety/equipment       General transfer comment: +2 mod to power up and stabilize balance, heavy posterior lean with initial stance requiring increased time to stabilize balance. Once balance stabilized, +1 mod assist to take pivot steps with RW. +2 to retrieve recliner and  position behind pt.    Balance Overall balance assessment: Needs assistance Sitting-balance support: Single extremity supported;Bilateral upper extremity supported;Feet supported Sitting balance-[name] Scale: Fair Sitting balance - Comments: reliant on unilateral UE support   Standing balance support: Bilateral upper extremity supported;During functional activity Standing balance-[name] Scale: Poor Standing balance comment: reliant on UE support and external assist                           ADL either performed or assessed with clinical judgement   ADL Overall ADL's : Needs assistance/impaired                 Upper Body Dressing : Maximal assistance;Sitting Upper Body Dressing Details (indicate cue type and reason): don brace     Toilet Transfer: Moderate assistance;+2 for physical assistance;Ambulation;RW;Cueing for sequencing;Cueing for safety (short distance; turn to recliner) Toilet Transfer Details (indicate cue type and reason): Mod A for maintaining balance to turn to recliner         Functional mobility during ADLs: Moderate assistance;Rolling walker;+2 for safety/equipment (pivot to recliner) General ADL Comments: donning brace and then performing stand and turn to recliner. take a few steps     Vision       Perception     Praxis      Cognition Arousal/Alertness: Awake/alert Behavior During Therapy: WFL for tasks assessed/performed Overall Cognitive Status: Impaired/Different from baseline Area of Impairment: Safety/judgement;Following commands                       Following Commands: Follows one step commands with increased time Safety/Judgement: Decreased awareness of safety  General Comments: Pt agreeable to OOB. Following simple commands. Requiring increased time. [facility]        Exercises     Shoulder Instructions       General Comments Daughter arriving at end of session. Gained information about PLOF. Pt was very  independent and only using a cane prior to fall. daughter very supportive    Pertinent Vitals/ Pain       Pain Assessment: Faces Faces Pain Scale: Hurts little more Pain Location: generalized with mobility Pain Descriptors / Indicators: Grimacing;Discomfort Pain Intervention(s): Monitored during session;Limited activity within patient's tolerance;Repositioned  Home Living                                          Prior Functioning/Environment              Frequency  Min 2X/week        Progress Toward Goals  OT Goals(current goals can now be found in the care plan section)  Progress towards OT goals: Progressing toward goals  Acute Rehab OT Goals Patient Stated Goal: not stated OT Goal Formulation: With patient Time For Goal Achievement: 06/16/21 Potential to Achieve Goals: Good ADL Goals Pt Will Perform Upper Body Dressing: with min assist;sitting Pt Will Transfer to Toilet: with min guard assist;stand pivot transfer;bedside commode Pt Will Perform Toileting - Clothing Manipulation and hygiene: with min guard assist;sit to/from stand;sitting/lateral leans Additional ADL Goal #1: Pt will perform bed mobility using log roll with Min Guard A in preparation for ADLs  Plan Discharge plan remains appropriate    Co-evaluation    PT/OT/SLP Co-Evaluation/Treatment: Yes Reason for Co-Treatment: To address functional/ADL transfers;For patient/therapist safety   OT goals addressed during session: ADL's and self-care;Other (comment) (To address taking steps and improve mobility beyond transfers)      AM-PAC OT "6 Clicks" Daily Activity     Outcome Measure   Help from another person eating meals?: A Little Help from another person taking care of personal grooming?: A Little Help from another person toileting, which includes using toliet, bedpan, or urinal?: A Lot Help from another person bathing (including washing, rinsing, drying)?: A Lot Help from  another person to put on and taking off regular upper body clothing?: A Lot Help from another person to put on and taking off regular lower body clothing?: A Lot 6 Click Score: 14    End of Session Equipment Utilized During Treatment: Back brace;Gait belt;Rolling walker  OT Visit Diagnosis: Unsteadiness on feet (R26.81);Other abnormalities of gait and mobility (R26.89);Muscle weakness (generalized) (M62.81);Pain Pain - part of body:  (back)   Activity Tolerance Patient tolerated treatment well   Patient Left in chair;with call bell/phone within reach;with chair alarm set;with family/visitor present   Nurse Communication Mobility status;Other (comment) (brace wear orders)        Time: 1610-9604 OT Time Calculation (min): 26 min  Charges: OT General Charges $OT Visit: 1 Visit OT Treatments $Self Care/Home Management : 8-22 mins  Baran Kuhrt MSOT, OTR/L Acute Rehab Pager: [phone] Office: [phone]   Theodoro Grist Salimah Martinovich 06/06/2021, 11:51 AM

## 2021-06-06 NOTE — Progress Notes (Signed)
Pt picked up by [facility] to be transported off unit to disposition. Pt transported off unit via stretcher with belongings to the side. P. Amo Ladarius Seubert RN 

## 2022-07-01 ENCOUNTER — Other Ambulatory Visit: Payer: Self-pay

## 2022-07-01 ENCOUNTER — Inpatient Hospital Stay (HOSPITAL_COMMUNITY): Payer: Medicare PPO

## 2022-07-01 ENCOUNTER — Emergency Department (HOSPITAL_COMMUNITY): Payer: Medicare PPO

## 2022-07-01 ENCOUNTER — Encounter (HOSPITAL_COMMUNITY): Payer: Self-pay

## 2022-07-01 ENCOUNTER — Inpatient Hospital Stay (HOSPITAL_COMMUNITY)
Admission: EM | Admit: 2022-07-01 | Discharge: 2022-07-06 | DRG: 480 | Disposition: A | Discharge status: Skilled Nursing Facility | Payer: Medicare PPO | Attending: Family Medicine | Admitting: Family Medicine

## 2022-07-01 DIAGNOSIS — I1310 Hypertensive heart and chronic kidney disease without heart failure, with stage 1 through stage 4 chronic kidney disease, or unspecified chronic kidney disease: Secondary | ICD-10-CM | POA: Diagnosis present

## 2022-07-01 DIAGNOSIS — S32030G Wedge compression fracture of third lumbar vertebra, subsequent encounter for fracture with delayed healing: Secondary | ICD-10-CM

## 2022-07-01 DIAGNOSIS — M8008XA Age-related osteoporosis with current pathological fracture, vertebra(e), initial encounter for fracture: Secondary | ICD-10-CM | POA: Diagnosis present

## 2022-07-01 DIAGNOSIS — S0630AA Unspecified focal traumatic brain injury with loss of consciousness status unknown, initial encounter: Secondary | ICD-10-CM | POA: Diagnosis present

## 2022-07-01 DIAGNOSIS — D6959 Other secondary thrombocytopenia: Secondary | ICD-10-CM | POA: Diagnosis present

## 2022-07-01 DIAGNOSIS — R6510 Systemic inflammatory response syndrome (SIRS) of non-infectious origin without acute organ dysfunction: Secondary | ICD-10-CM | POA: Diagnosis present

## 2022-07-01 DIAGNOSIS — Z20822 Contact with and (suspected) exposure to covid-19: Secondary | ICD-10-CM | POA: Diagnosis present

## 2022-07-01 DIAGNOSIS — R4182 Altered mental status, unspecified: Secondary | ICD-10-CM | POA: Diagnosis present

## 2022-07-01 DIAGNOSIS — Z823 Family history of stroke: Secondary | ICD-10-CM | POA: Diagnosis not present

## 2022-07-01 DIAGNOSIS — Z7984 Long term (current) use of oral hypoglycemic drugs: Secondary | ICD-10-CM

## 2022-07-01 DIAGNOSIS — Z96642 Presence of left artificial hip joint: Secondary | ICD-10-CM | POA: Diagnosis present

## 2022-07-01 DIAGNOSIS — D696 Thrombocytopenia, unspecified: Secondary | ICD-10-CM | POA: Diagnosis present

## 2022-07-01 DIAGNOSIS — Z79899 Other long term (current) drug therapy: Secondary | ICD-10-CM | POA: Diagnosis not present

## 2022-07-01 DIAGNOSIS — W1830XA Fall on same level, unspecified, initial encounter: Secondary | ICD-10-CM | POA: Diagnosis present

## 2022-07-01 DIAGNOSIS — N1831 Chronic kidney disease, stage 3a: Secondary | ICD-10-CM

## 2022-07-01 DIAGNOSIS — I10 Essential (primary) hypertension: Secondary | ICD-10-CM | POA: Diagnosis present

## 2022-07-01 DIAGNOSIS — M1990 Unspecified osteoarthritis, unspecified site: Secondary | ICD-10-CM | POA: Diagnosis present

## 2022-07-01 DIAGNOSIS — E8809 Other disorders of plasma-protein metabolism, not elsewhere classified: Secondary | ICD-10-CM | POA: Diagnosis not present

## 2022-07-01 DIAGNOSIS — D631 Anemia in chronic kidney disease: Secondary | ICD-10-CM | POA: Diagnosis present

## 2022-07-01 DIAGNOSIS — Z8249 Family history of ischemic heart disease and other diseases of the circulatory system: Secondary | ICD-10-CM

## 2022-07-01 DIAGNOSIS — E876 Hypokalemia: Secondary | ICD-10-CM | POA: Diagnosis present

## 2022-07-01 DIAGNOSIS — E1122 Type 2 diabetes mellitus with diabetic chronic kidney disease: Secondary | ICD-10-CM | POA: Diagnosis present

## 2022-07-01 DIAGNOSIS — S72001A Fracture of unspecified part of neck of right femur, initial encounter for closed fracture: Secondary | ICD-10-CM | POA: Diagnosis present

## 2022-07-01 DIAGNOSIS — S06350A Traumatic hemorrhage of left cerebrum without loss of consciousness, initial encounter: Secondary | ICD-10-CM | POA: Diagnosis present

## 2022-07-01 DIAGNOSIS — G20 Parkinson's disease: Secondary | ICD-10-CM | POA: Diagnosis present

## 2022-07-01 DIAGNOSIS — E872 Acidosis, unspecified: Secondary | ICD-10-CM | POA: Diagnosis present

## 2022-07-01 DIAGNOSIS — Z7901 Long term (current) use of anticoagulants: Secondary | ICD-10-CM | POA: Diagnosis not present

## 2022-07-01 DIAGNOSIS — Z888 Allergy status to other drugs, medicaments and biological substances status: Secondary | ICD-10-CM | POA: Diagnosis not present

## 2022-07-01 DIAGNOSIS — S72051A Unspecified fracture of head of right femur, initial encounter for closed fracture: Principal | ICD-10-CM

## 2022-07-01 DIAGNOSIS — M80051A Age-related osteoporosis with current pathological fracture, right femur, initial encounter for fracture: Principal | ICD-10-CM | POA: Diagnosis present

## 2022-07-01 DIAGNOSIS — E869 Volume depletion, unspecified: Secondary | ICD-10-CM | POA: Diagnosis present

## 2022-07-01 DIAGNOSIS — G9341 Metabolic encephalopathy: Secondary | ICD-10-CM | POA: Diagnosis present

## 2022-07-01 DIAGNOSIS — E119 Type 2 diabetes mellitus without complications: Secondary | ICD-10-CM | POA: Diagnosis not present

## 2022-07-01 LAB — COMPREHENSIVE METABOLIC PANEL
ALT: 25 U/L (ref 0–44)
AST: 35 U/L (ref 15–41)
Albumin: 4.7 g/dL (ref 3.5–5.0)
Alkaline Phosphatase: 51 U/L (ref 38–126)
Anion gap: 17 — ABNORMAL HIGH (ref 5–15)
BUN: 14 mg/dL (ref 8–23)
CO2: 21 mmol/L — ABNORMAL LOW (ref 22–32)
Calcium: 9.5 mg/dL (ref 8.9–10.3)
Chloride: 97 mmol/L — ABNORMAL LOW (ref 98–111)
Creatinine, Ser: 1.04 mg/dL — ABNORMAL HIGH (ref 0.44–1.00)
GFR, Estimated: 50 mL/min — ABNORMAL LOW (ref >60)
Glucose, Bld: 273 mg/dL — ABNORMAL HIGH (ref 70–99)
Potassium: 3.2 mmol/L — ABNORMAL LOW (ref 3.5–5.1)
Sodium: 135 mmol/L (ref 135–145)
Total Bilirubin: 1.4 mg/dL — ABNORMAL HIGH (ref 0.3–1.2)
Total Protein: 8 g/dL (ref 6.5–8.1)

## 2022-07-01 LAB — URINALYSIS, ROUTINE W REFLEX MICROSCOPIC
Bacteria, UA: NONE SEEN
Bilirubin Urine: NEGATIVE
Glucose, UA: >=500 mg/dL — AB
Ketones, ur: 5 mg/dL — AB
Leukocytes,Ua: NEGATIVE
Nitrite: NEGATIVE
Protein, ur: >=300 mg/dL — AB
Specific Gravity, Urine: 1.01 (ref 1.005–1.030)
pH: 7 (ref 5.0–8.0)

## 2022-07-01 LAB — LACTIC ACID, PLASMA
Lactic Acid, Venous: 4.5 mmol/L (ref 0.5–1.9)
Lactic Acid, Venous: 2.7 mmol/L (ref 0.5–1.9)

## 2022-07-01 LAB — CBC WITH DIFFERENTIAL/PLATELET
Abs Immature Granulocytes: 0.02 10*3/uL (ref 0.00–0.07)
Basophils Absolute: 0 10*3/uL (ref 0.0–0.1)
Basophils Relative: 0 %
Eosinophils Absolute: 0 10*3/uL (ref 0.0–0.5)
Eosinophils Relative: 0 %
HCT: 40.8 % (ref 36.0–46.0)
Hemoglobin: 13.2 g/dL (ref 12.0–15.0)
Immature Granulocytes: 0 %
Lymphocytes Relative: 4 %
Lymphs Abs: 0.3 10*3/uL — ABNORMAL LOW (ref 0.7–4.0)
MCH: 28.9 pg (ref 26.0–34.0)
MCHC: 32.4 g/dL (ref 30.0–36.0)
MCV: 89.5 fL (ref 80.0–100.0)
Monocytes Absolute: 0.3 10*3/uL (ref 0.1–1.0)
Monocytes Relative: 3 %
Neutro Abs: 7.1 10*3/uL (ref 1.7–7.7)
Neutrophils Relative %: 93 %
Platelets: 187 10*3/uL (ref 150–400)
RBC: 4.56 MIL/uL (ref 3.87–5.11)
RDW: 14.3 % (ref 11.5–15.5)
WBC: 7.7 10*3/uL (ref 4.0–10.5)
nRBC: 0 % (ref 0.0–0.2)

## 2022-07-01 LAB — RESP PANEL BY RT-PCR (FLU A&B, COVID) ARPGX2
Influenza A by PCR: NEGATIVE
Influenza B by PCR: NEGATIVE
SARS Coronavirus 2 by RT PCR: NEGATIVE

## 2022-07-01 LAB — PROTIME-INR
INR: 1.1 (ref 0.8–1.2)
Prothrombin Time: 14.1 seconds (ref 11.4–15.2)

## 2022-07-01 MED ORDER — POTASSIUM CHLORIDE CRYS ER 20 MEQ PO TBCR
40.0000 meq | EXTENDED_RELEASE_TABLET | Freq: Once | ORAL | Status: AC
  Administered 2022-07-01: 40 meq via ORAL
  Filled 2022-07-01: qty 2

## 2022-07-01 MED ORDER — ACETAMINOPHEN 500 MG PO TABS
1000.0000 mg | ORAL_TABLET | ORAL | Status: AC
  Administered 2022-07-01: 1000 mg via ORAL
  Filled 2022-07-01: qty 2

## 2022-07-01 MED ORDER — IOHEXOL 300 MG/ML  SOLN
100.0000 mL | Freq: Once | INTRAMUSCULAR | Status: AC | PRN
Start: 2022-07-01 — End: 2022-07-01
  Administered 2022-07-01: 100 mL via INTRAVENOUS

## 2022-07-01 MED ORDER — SODIUM CHLORIDE 0.9 % IV BOLUS
1000.0000 mL | Freq: Once | INTRAVENOUS | Status: AC
  Administered 2022-07-01: 1000 mL via INTRAVENOUS

## 2022-07-01 NOTE — ED Notes (Signed)
Patient transported to CT 

## 2022-07-01 NOTE — ED Provider Notes (Signed)
[facility] EMERGENCY DEPARTMENT Provider Note   CSN: [id number] Arrival date & time: 07/01/22  1519     History  Chief Complaint  Patient presents with   Fall   Altered Mental Status    Alicia Vang is a 88 y.o. female.   Fall  Altered Mental Status  Patient is a 88 year old female with past medical history significant for Parkinson's disease, HTN, osteoarthritis, arthritis, DM 2  Patient presented emergency room today with daughter chief complaint of confusion seems that yesterday evening patient suffered a unwitnessed fall in the living room on a carpeted surface.  She fell from a standing position.  Family member states that she did not strike her head however on further questioning family member states that she was not present when the fall occurred and the patient was by himself.  Family member arrived in the living room only minutes after last seeing patient and found her sitting on the ground and she said she had fallen.  She is having difficulty walking because of pain in her right hip.  This morning patient seemed to be somewhat confused per family member and was brought to emergency room for further evaluation.  Patient denies any pain other than from right hip.  Some chronic rhinorrhea.  No urinary frequency urgency dysuria or hematuria.  No coughing, hemoptysis, chest pain or difficulty breathing or shortness of breath.  No lightheadedness or dizziness.  Patient is unable to provide any location on what caused her to fall.  She denies headache numbness or weakness.     Home Medications Prior to Admission medications   Medication Sig Start Date End Date Taking? Authorizing Provider  acetaminophen (TYLENOL) 325 MG tablet Take 2 tablets (650 mg total) by mouth every 6 (six) hours as needed for mild pain or headache (fever >/= 101). 10/24/20   Lonia Blood, MD  brinzolamide (AZOPT) 1 % ophthalmic suspension Place 1 drop into both eyes at  bedtime. 03/10/21   [provider]  chlorthalidone (HYGROTON) 25 MG tablet Take 1 tablet (25 mg total) by mouth daily. 06/06/21 07/06/21  Zigmund Daniel., MD  diclofenac Sodium (VOLTAREN) 1 % GEL Apply 2 g topically 4 (four) times daily as needed (lower back pain as needed). 06/06/21   Zigmund Daniel., MD  glimepiride (AMARYL) 2 MG tablet Take 4 mg by mouth daily with breakfast.     [provider]  latanoprost (XALATAN) 0.005 % ophthalmic solution Place 1 drop into both eyes at bedtime.    [provider]  losartan (COZAAR) 100 MG tablet Take 100 mg by mouth daily.    [provider]  Metoprolol Succinate 50 MG CS24 Take 50 mg by mouth daily.     [provider]  polyethylene glycol (MIRALAX / GLYCOLAX) 17 g packet Take 17 g by mouth 2 (two) times daily. 06/06/21   Zigmund Daniel., MD  Potassium Chloride ER 20 MEQ TBCR Take 20 mEq by mouth daily. 06/06/21   Zigmund Daniel., MD  XIIDRA 5 % SOLN Place 1 drop into both eyes at bedtime. 07/26/20   [provider]  XARELTO 15 MG TABS tablet Take 15 mg by mouth daily. Patient not taking: Reported on 06/01/2021 03/17/21 06/01/21  [provider]      Allergies    Amlodipine    Review of Systems   Review of Systems  Physical Exam Updated Vital Signs BP (!) 182/108   Pulse  70   Temp (!) 101 F (38.3 C) (Rectal)   Resp 13   Ht 5\' 5"  (1.651 m)   Wt 58.1 kg   SpO2 95%   BMI 21.30 kg/m  Physical Exam Vitals and nursing note reviewed.  HENT:     Head: Normocephalic and atraumatic.     Nose: Nose normal.     Mouth/Throat:     Mouth: Mucous membranes are dry.  Eyes:     General: No scleral icterus. Cardiovascular:     Rate and Rhythm: Normal rate and regular rhythm.     Pulses: Normal pulses.     Heart sounds: Normal heart sounds.  Pulmonary:     Effort: Pulmonary effort is normal. No respiratory distress.     Breath sounds: No wheezing.  Abdominal:      Palpations: Abdomen is soft.     Tenderness: There is no abdominal tenderness. There is no guarding or rebound.     Comments: Ventral hernia  No significant abdominal tenderness  Musculoskeletal:     Cervical back: Normal range of motion.     Right lower leg: No edema.     Left lower leg: No edema.     Comments: Right hip tenderness  Skin:    General: Skin is warm and dry.     Capillary Refill: Capillary refill takes less than 2 seconds.  Neurological:     Mental Status: She is alert. Mental status is at baseline.  Psychiatric:        Mood and Affect: Mood normal.        Behavior: Behavior normal.     ED Results / Procedures / Treatments   Labs (all labs ordered are listed, but only abnormal results are displayed) Labs Reviewed  COMPREHENSIVE METABOLIC PANEL - Abnormal; Notable for the following components:      Result Value   Potassium 3.2 (*)    Chloride 97 (*)    CO2 21 (*)    Glucose, Bld 273 (*)    Creatinine, Ser 1.04 (*)    Total Bilirubin 1.4 (*)    GFR, Estimated 50 (*)    Anion gap 17 (*)    All other components within normal limits  LACTIC ACID, PLASMA - Abnormal; Notable for the following components:   Lactic Acid, Venous 4.5 (*)    All other components within normal limits  LACTIC ACID, PLASMA - Abnormal; Notable for the following components:   Lactic Acid, Venous 2.7 (*)    All other components within normal limits  CBC WITH DIFFERENTIAL/PLATELET - Abnormal; Notable for the following components:   Lymphs Abs 0.3 (*)    All other components within normal limits  URINALYSIS, ROUTINE W REFLEX MICROSCOPIC - Abnormal; Notable for the following components:   Color, Urine STRAW (*)    Glucose, UA >=500 (*)    Hgb urine dipstick MODERATE (*)    Ketones, ur 5 (*)    Protein, ur >=300 (*)    All other components within normal limits  RESP PANEL BY RT-PCR (FLU A&B, COVID) ARPGX2  CULTURE, BLOOD (ROUTINE X 2)  CULTURE, BLOOD (ROUTINE X 2)  PROTIME-INR     EKG None  Radiology CT ABDOMEN PELVIS W CONTRAST  Result Date: 07/01/2022 CLINICAL DATA:  Acute abdominal pain. EXAM: CT ABDOMEN AND PELVIS WITH CONTRAST TECHNIQUE: Multidetector CT imaging of the abdomen and pelvis was performed using the standard protocol following bolus administration of intravenous contrast. RADIATION DOSE REDUCTION: This exam was performed according  to the departmental dose-optimization program which includes automated exposure control, adjustment of the mA and/or kV according to patient size and/or use of iterative reconstruction technique. CONTRAST:  100mL OMNIPAQUE IOHEXOL 300 MG/ML  SOLN COMPARISON:  CT renal stone 03/24/2021.  Lumbar spine CT 05/31/2021. FINDINGS: Lower chest: No acute abnormality. Hepatobiliary: Hepatic cysts are present. The largest measures 3.7 cm. These are similar to the prior study. Gallbladder and bile ducts are within normal limits. Pancreas: Unremarkable. No pancreatic ductal dilatation or surrounding inflammatory changes. Spleen: Normal in size without focal abnormality. Adrenals/Urinary Tract: Adrenal glands are unremarkable. Kidneys are normal, without renal calculi, focal lesion, or hydronephrosis. Bladder is unremarkable. Stomach/Bowel: Stomach is within normal limits. No evidence of bowel wall thickening, distention, or inflammatory changes. There is sigmoid colon diverticulosis. The appendix is not visualized. Vascular/Lymphatic: Aortic atherosclerosis. No enlarged abdominal or pelvic lymph nodes. Reproductive: Status post hysterectomy. No adnexal masses. Other: There is some thinning and bulging of the anterior abdominal wall, unchanged. There is no ascites. Musculoskeletal: The bones are diffusely osteopenic. There is moderate compression fracture of L3 which has progressed compared to the prior study. There is minimal retropulsion of fracture fragments along the superior endplate without significant central canal stenosis. Mild chronic  compression deformity of T12 is unchanged. Left hip arthroplasty is present. IMPRESSION: 1. Progression of L3 compression deformity. Please correlate clinically for acuity. 2. No other acute localizing process in the abdomen or pelvis. 3. Colonic diverticulosis. 4. Hepatic cysts. 5.  Aortic Atherosclerosis (ICD10-I70.0). Electronically Signed   By: Darliss CheneyAmy  Guttmann M.D.   On: 07/01/2022 21:57   CT Cervical Spine Wo Contrast  Result Date: 07/01/2022 CLINICAL DATA:  Fall. EXAM: CT CERVICAL SPINE WITHOUT CONTRAST TECHNIQUE: Multidetector CT imaging of the cervical spine was performed without intravenous contrast. Multiplanar CT image reconstructions were also generated. RADIATION DOSE REDUCTION: This exam was performed according to the departmental dose-optimization program which includes automated exposure control, adjustment of the mA and/or kV according to patient size and/or use of iterative reconstruction technique. COMPARISON:  CT of the cervical spine 05/31/2021 FINDINGS: Alignment: No significant listhesis is present. Mild straightening of the normal cervical lordosis is stable. Skull base and vertebrae: Craniocervical junction is normal. Vertebral body heights are normal. No acute or healing fracture is present. Soft tissues and spinal canal: No prevertebral fluid or swelling. No visible canal hematoma. Disc levels:  Stable degenerative changes noted. Upper chest: Scarring is present both lung scratched at scarring at both lung apices is clear. No acute disease is present. IMPRESSION: 1. No acute fracture or traumatic subluxation. 2. Stable degenerative changes of the cervical spine. These results were called by telephone at the time of interpretation on 07/01/2022 at 6:47 pm to provider DR Rush LandmarkEGELER, who verbally acknowledged these results. Electronically Signed   By: Marin Robertshristopher  Mattern M.D.   On: 07/01/2022 18:47   CT HEAD WO CONTRAST (5MM)  Result Date: 07/01/2022 CLINICAL DATA:  Fall today. Mental status  changes. EXAM: CT HEAD WITHOUT CONTRAST TECHNIQUE: Contiguous axial images were obtained from the base of the skull through the vertex without intravenous contrast. RADIATION DOSE REDUCTION: This exam was performed according to the departmental dose-optimization program which includes automated exposure control, adjustment of the mA and/or kV according to patient size and/or use of iterative reconstruction technique. COMPARISON:  None Available. FINDINGS: Brain: Moderate generalized atrophy and white matter disease again seen. A small focus of intraventricular hemorrhage is layering posteriorly in the left lateral ventricle. Ventricle size is stable. No significant  extra axial hemorrhage is present. The brainstem and cerebellum are within normal limits. Vascular: No hyperdense vessel or unexpected calcification. Skull: Calvarium is intact. No focal lytic or blastic lesions are present. No significant extracranial soft tissue lesion is present. Sinuses/Orbits: The globes and orbits are within normal limits. The paranasal sinuses and mastoid air cells are clear. IMPRESSION: 1. Small focus of intraventricular hemorrhage layering posteriorly in the left lateral ventricle. 2. Stable atrophy and white matter disease. This likely reflects the sequela of chronic microvascular ischemia. Critical Value/emergent results were called by telephone at the time of interpretation on 07/01/2022 at 6:43 pm to provider Dr. Rush Landmark, who verbally acknowledged these results. Electronically Signed   By: Marin Roberts M.D.   On: 07/01/2022 18:44   DG Hip Unilat  With Pelvis 2-3 Views Right  Result Date: 07/01/2022 CLINICAL DATA:  Acute RIGHT hip pain following fall. Initial encounter. EXAM: DG HIP (WITH OR WITHOUT PELVIS) 2-3V RIGHT COMPARISON:  03/24/2021 abdominopelvic CT FINDINGS: A subcapital RIGHT femoral neck fracture is noted with mild impaction. There is no evidence of dislocation. LEFT total hip arthroplasty changes are  again noted. Degenerative changes of the LOWER lumbar spine are present. IMPRESSION: Subcapital RIGHT femoral neck fracture with mild impaction. Electronically Signed   By: Harmon Pier M.D.   On: 07/01/2022 17:07   DG Chest 2 View  Result Date: 07/01/2022 CLINICAL DATA:  Fall, confusion, pain. EXAM: CHEST - 2 VIEW COMPARISON:  Chest x-ray dated 03/24/2021 FINDINGS: Stable cardiomegaly. Coarse lung markings bilaterally. Chronic bronchitic changes centrally. No confluent opacity to suggest a developing pneumonia. No pleural effusion or pneumothorax is seen. Chronic compression fracture deformities within the upper and lower thoracic spine. No acute-appearing osseous abnormality. IMPRESSION: 1. No acute findings. No evidence of pneumonia or pulmonary edema. 2. Stable cardiomegaly. 3. Chronic bronchitic changes and chronic interstitial lung disease. Electronically Signed   By: Bary Richard M.D.   On: 07/01/2022 17:02    Procedures Procedures    Medications Ordered in ED Medications  sodium chloride 0.9 % bolus 1,000 mL (0 mLs Intravenous Stopped 07/01/22 1918)  acetaminophen (TYLENOL) tablet 1,000 mg (1,000 mg Oral Given 07/01/22 1731)  potassium chloride SA (KLOR-CON M) CR tablet 40 mEq (40 mEq Oral Given 07/01/22 2006)  iohexol (OMNIPAQUE) 300 MG/ML solution 100 mL (100 mLs Intravenous Contrast Given 07/01/22 2139)    ED Course/ Medical Decision Making/ A&P Clinical Course as of 07/01/22 2306  Sat Jul 01, 2022  1724 R femur fx [WF]  1843 CXR unremarkable IMPRESSION: 1. No acute findings. No evidence of pneumonia or pulmonary edema. 2. Stable cardiomegaly. 3. Chronic bronchitic changes and chronic interstitial lung disease. [WF]  1910 N[place] - no action needed.  [WF]  2011 Discussed with Dannielle Burn of [facility] - aware of pt and will discuss w Dr. Ave Filter for likely operative mgmt.  [WF]  2118 Next for CT  [WF]    Clinical Course User Index [WF] Gailen Shelter, PA                            Medical Decision Making Amount and/or Complexity of Data Reviewed Labs: ordered. Radiology: ordered.  Risk OTC drugs. Prescription drug management. Decision regarding hospitalization.   This patient presents to the ED for concern of right hip pain, fall, AMS, fever this involves a number of treatment options, and is a complaint that carries with it a moderate/high risk of complications and morbidity.  The differential diagnosis includes viral infection such as COVID, flu, other respiratory virus.  Also includes urinary tract infection, pneumonia, other infectious cause, metabolic causes include hypothyroidism however without tachycardia or any other significant symptoms of hypothyroidism low suspicion for this.     Co morbidities: Discussed in HPI   Brief History:  Patient is a 88 year old female with past medical history significant for Parkinson's disease, HTN, osteoarthritis, arthritis, DM 2  Patient presented emergency room today with daughter chief complaint of confusion seems that yesterday evening patient suffered a unwitnessed fall in the living room on a carpeted surface.  She fell from a standing position.  Family member states that she did not strike her head however on further questioning family member states that she was not present when the fall occurred and the patient was by himself.  Family member arrived in the living room only minutes after last seeing patient and found her sitting on the ground and she said she had fallen.  She is having difficulty walking because of pain in her right hip.  This morning patient seemed to be somewhat confused per family member and was brought to emergency room for further evaluation.  Patient denies any pain other than from right hip.  Some chronic rhinorrhea.  No urinary frequency urgency dysuria or hematuria.  No coughing, hemoptysis, chest pain or difficulty breathing or shortness of breath.  No lightheadedness or  dizziness.  Patient is unable to provide any location on what caused her to fall.  She denies headache numbness or weakness.    EMR reviewed including pt PMHx, past surgical history and past visits to ER.   See HPI for more details   Lab Tests:   I ordered and independently interpreted labs. Labs notable for lactic acidosis of 4.5 improved to 2.7 with 1 L of crystalloid.  CMP with mild hypokalemia, mild hyperglycemia, mildly elevated bilirubin at 1.4.  Urinalysis without evidence of infection.  0-5 WBCs and no bacteria or leukocytes.  Blood cultures obtained.    Imaging Studies:  Abnormal findings. I personally reviewed all imaging studies. Imaging notable for unremarkable chest x-ray.  Right impacted femoral neck fracture.  Small intraventricular hemorrhage noted on CT head.  Discussed with Dr. Conchita Paris no intervention necessary, does not require repeat CT scan.    Cardiac Monitoring:  The patient was maintained on a cardiac monitor.  I personally viewed and interpreted the cardiac monitored which showed an underlying rhythm of: NSR EKG non-ischemic   Medicines ordered:  I ordered medication including Tylenol, potassium, 1 L normal saline for hydration, right hip pain, fever Reevaluation of the patient after these medicines showed that the patient improved I have reviewed the patients home medicines and have made adjustments as needed   Critical Interventions:     Consults/Attending Physician   I requested consultation with Dr. Conchita Paris of neurosurgery,  and discussed lab and imaging findings as well as pertinent plan - they recommend: Dr. Conchita Paris reviewed the images of the CT scan of this patient's head.  He recommended no additional set to be taken.  Does not recommend repeat CT scan of head and states no need for neurosurgical consultation or additional steps regarding these images.  I discussed case with Dannielle Burn of [facility] PA who will discuss  case with Dr. Ave Filter for consideration of operative management.  Discussed case with Dr. Leafy Half who will admit patient to the medical service.  Reevaluation:  After the interventions noted above I re-evaluated patient and  found that they have :stayed the same   Social Determinants of Health:      Problem List / ED Course:  Impacted right femoral neck fracture -[facility] Fever of unknown origin Lactic acidosis - improving Hypokalemia    Dispostion:  After consideration of the diagnostic results and the patients response to treatment, I feel that the patent would benefit from admission.  Final Clinical Impression(s) / ED Diagnoses Final diagnoses:  Closed fracture of head of right femur, initial encounter [facility])    Rx / DC Orders ED Discharge Orders     None         Gailen Shelter, [place] 07/01/22 2307    Jacalyn Lefevre, MD 07/02/22 1458

## 2022-07-01 NOTE — Consult Note (Signed)
I reviewed the patients R hip x-rays.  Please keep her NPO after midnight and I will see the patient in the am and speak to her about the options.

## 2022-07-01 NOTE — ED Notes (Addendum)
NA

## 2022-07-01 NOTE — ED Triage Notes (Signed)
Patient has some confusion this morning which lead to a fall and patient complaining of right hip.leg pain and inability to walk.  Family reports patient did not hit head but unsure if her confusion is from a UTI.  Patient is febrile in triage.  Attempted BP 2 times but still hypertensive but patient has tremors due to parkinsons.

## 2022-07-02 ENCOUNTER — Other Ambulatory Visit: Payer: Self-pay

## 2022-07-02 ENCOUNTER — Inpatient Hospital Stay (HOSPITAL_COMMUNITY): Payer: Medicare PPO | Admitting: Certified Registered Nurse Anesthetist

## 2022-07-02 ENCOUNTER — Encounter (HOSPITAL_COMMUNITY)
Admission: EM | Disposition: A | Discharge status: Skilled Nursing Facility | Payer: Self-pay | Source: Home / Self Care | Attending: Internal Medicine

## 2022-07-02 ENCOUNTER — Encounter (HOSPITAL_COMMUNITY): Payer: Self-pay | Admitting: Internal Medicine

## 2022-07-02 ENCOUNTER — Inpatient Hospital Stay (HOSPITAL_COMMUNITY): Payer: Medicare PPO

## 2022-07-02 DIAGNOSIS — E119 Type 2 diabetes mellitus without complications: Secondary | ICD-10-CM

## 2022-07-02 DIAGNOSIS — I10 Essential (primary) hypertension: Secondary | ICD-10-CM | POA: Diagnosis not present

## 2022-07-02 DIAGNOSIS — S32030G Wedge compression fracture of third lumbar vertebra, subsequent encounter for fracture with delayed healing: Secondary | ICD-10-CM | POA: Diagnosis not present

## 2022-07-02 DIAGNOSIS — G9341 Metabolic encephalopathy: Secondary | ICD-10-CM | POA: Diagnosis not present

## 2022-07-02 DIAGNOSIS — E876 Hypokalemia: Secondary | ICD-10-CM

## 2022-07-02 DIAGNOSIS — E872 Acidosis, unspecified: Secondary | ICD-10-CM

## 2022-07-02 DIAGNOSIS — R6510 Systemic inflammatory response syndrome (SIRS) of non-infectious origin without acute organ dysfunction: Secondary | ICD-10-CM

## 2022-07-02 DIAGNOSIS — D649 Anemia, unspecified: Secondary | ICD-10-CM

## 2022-07-02 DIAGNOSIS — S72001A Fracture of unspecified part of neck of right femur, initial encounter for closed fracture: Secondary | ICD-10-CM | POA: Diagnosis not present

## 2022-07-02 DIAGNOSIS — S0630AA Unspecified focal traumatic brain injury with loss of consciousness status unknown, initial encounter: Secondary | ICD-10-CM

## 2022-07-02 HISTORY — PX: HIP PINNING,CANNULATED: SHX1758

## 2022-07-02 LAB — BLOOD GAS, VENOUS
Acid-Base Excess: 2.9 mmol/L — ABNORMAL HIGH (ref 0.0–2.0)
Bicarbonate: 25.9 mmol/L (ref 20.0–28.0)
O2 Saturation: 96.8 %
Patient temperature: 37
pCO2, Ven: 34 mmHg — ABNORMAL LOW (ref 44–60)
pH, Ven: 7.49 — ABNORMAL HIGH (ref 7.25–7.43)
pO2, Ven: 73 mmHg — ABNORMAL HIGH (ref 32–45)

## 2022-07-02 LAB — COMPREHENSIVE METABOLIC PANEL
ALT: 19 U/L (ref 0–44)
AST: 25 U/L (ref 15–41)
Albumin: 3.7 g/dL (ref 3.5–5.0)
Alkaline Phosphatase: 43 U/L (ref 38–126)
Anion gap: 9 (ref 5–15)
BUN: 15 mg/dL (ref 8–23)
CO2: 25 mmol/L (ref 22–32)
Calcium: 9.1 mg/dL (ref 8.9–10.3)
Chloride: 102 mmol/L (ref 98–111)
Creatinine, Ser: 0.99 mg/dL (ref 0.44–1.00)
GFR, Estimated: 53 mL/min — ABNORMAL LOW (ref >60)
Glucose, Bld: 153 mg/dL — ABNORMAL HIGH (ref 70–99)
Potassium: 2.8 mmol/L — ABNORMAL LOW (ref 3.5–5.1)
Sodium: 136 mmol/L (ref 135–145)
Total Bilirubin: 0.9 mg/dL (ref 0.3–1.2)
Total Protein: 7 g/dL (ref 6.5–8.1)

## 2022-07-02 LAB — FOLATE: Folate: 9.8 ng/mL (ref >5.9)

## 2022-07-02 LAB — CBC WITH DIFFERENTIAL/PLATELET
Abs Immature Granulocytes: 0.01 10*3/uL (ref 0.00–0.07)
Basophils Absolute: 0 10*3/uL (ref 0.0–0.1)
Basophils Relative: 0 %
Eosinophils Absolute: 0.1 10*3/uL (ref 0.0–0.5)
Eosinophils Relative: 1 %
HCT: 34.2 % — ABNORMAL LOW (ref 36.0–46.0)
Hemoglobin: 11.4 g/dL — ABNORMAL LOW (ref 12.0–15.0)
Immature Granulocytes: 0 %
Lymphocytes Relative: 7 %
Lymphs Abs: 0.4 10*3/uL — ABNORMAL LOW (ref 0.7–4.0)
MCH: 29.2 pg (ref 26.0–34.0)
MCHC: 33.3 g/dL (ref 30.0–36.0)
MCV: 87.5 fL (ref 80.0–100.0)
Monocytes Absolute: 0.2 10*3/uL (ref 0.1–1.0)
Monocytes Relative: 4 %
Neutro Abs: 5 10*3/uL (ref 1.7–7.7)
Neutrophils Relative %: 88 %
Platelets: 128 10*3/uL — ABNORMAL LOW (ref 150–400)
RBC: 3.91 MIL/uL (ref 3.87–5.11)
RDW: 14.4 % (ref 11.5–15.5)
WBC: 5.7 10*3/uL (ref 4.0–10.5)
nRBC: 0 % (ref 0.0–0.2)

## 2022-07-02 LAB — I-STAT VENOUS BLOOD GAS, ED
Acid-Base Excess: 2 mmol/L (ref 0.0–2.0)
Bicarbonate: 25.6 mmol/L (ref 20.0–28.0)
Calcium, Ion: 1.12 mmol/L — ABNORMAL LOW (ref 1.15–1.40)
HCT: 37 % (ref 36.0–46.0)
Hemoglobin: 12.6 g/dL (ref 12.0–15.0)
O2 Saturation: 94 %
Potassium: 3.1 mmol/L — ABNORMAL LOW (ref 3.5–5.1)
Sodium: 137 mmol/L (ref 135–145)
TCO2: 27 mmol/L (ref 22–32)
pCO2, Ven: 34.7 mmHg — ABNORMAL LOW (ref 44–60)
pH, Ven: 7.476 — ABNORMAL HIGH (ref 7.25–7.43)
pO2, Ven: 66 mmHg — ABNORMAL HIGH (ref 32–45)

## 2022-07-02 LAB — TSH: TSH: 1.255 u[IU]/mL (ref 0.350–4.500)

## 2022-07-02 LAB — GLUCOSE, CAPILLARY
Glucose-Capillary: 143 mg/dL — ABNORMAL HIGH (ref 70–99)
Glucose-Capillary: 110 mg/dL — ABNORMAL HIGH (ref 70–99)
Glucose-Capillary: 99 mg/dL (ref 70–99)
Glucose-Capillary: 115 mg/dL — ABNORMAL HIGH (ref 70–99)
Glucose-Capillary: 215 mg/dL — ABNORMAL HIGH (ref 70–99)

## 2022-07-02 LAB — RAPID URINE DRUG SCREEN, HOSP PERFORMED
Amphetamines: NOT DETECTED
Barbiturates: NOT DETECTED
Benzodiazepines: NOT DETECTED
Cocaine: NOT DETECTED
Opiates: NOT DETECTED
Tetrahydrocannabinol: NOT DETECTED

## 2022-07-02 LAB — RPR: RPR Ser Ql: NONREACTIVE

## 2022-07-02 LAB — PROTIME-INR
INR: 1.3 — ABNORMAL HIGH (ref 0.8–1.2)
Prothrombin Time: 15.6 seconds — ABNORMAL HIGH (ref 11.4–15.2)

## 2022-07-02 LAB — VITAMIN B12: Vitamin B-12: 268 pg/mL (ref 180–914)

## 2022-07-02 LAB — SURGICAL PCR SCREEN
MRSA, PCR: NEGATIVE
Staphylococcus aureus: NEGATIVE

## 2022-07-02 LAB — AMMONIA: Ammonia: 36 umol/L — ABNORMAL HIGH (ref 9–35)

## 2022-07-02 LAB — LACTIC ACID, PLASMA: Lactic Acid, Venous: 1.4 mmol/L (ref 0.5–1.9)

## 2022-07-02 LAB — HEMOGLOBIN A1C
Hgb A1c MFr Bld: 6.7 % — ABNORMAL HIGH (ref 4.8–5.6)
Mean Plasma Glucose: 145.59 mg/dL

## 2022-07-02 LAB — C-REACTIVE PROTEIN: CRP: 11 mg/dL — ABNORMAL HIGH (ref <1.0)

## 2022-07-02 LAB — SEDIMENTATION RATE: Sed Rate: 23 mm/hr — ABNORMAL HIGH (ref 0–22)

## 2022-07-02 LAB — MAGNESIUM: Magnesium: 1.8 mg/dL (ref 1.7–2.4)

## 2022-07-02 LAB — CBG MONITORING, ED: Glucose-Capillary: 146 mg/dL — ABNORMAL HIGH (ref 70–99)

## 2022-07-02 LAB — APTT: aPTT: 26 seconds (ref 24–36)

## 2022-07-02 SURGERY — FIXATION, FEMUR, NECK, PERCUTANEOUS, USING SCREW
Anesthesia: Monitor Anesthesia Care | Site: Hip | Laterality: Right

## 2022-07-02 MED ORDER — LIFITEGRAST 5 % OP SOLN
1.0000 [drp] | Freq: Every day | OPHTHALMIC | Status: DC
Start: 2022-07-02 — End: 2022-07-03

## 2022-07-02 MED ORDER — ACETAMINOPHEN 325 MG PO TABS
325.0000 mg | ORAL_TABLET | Freq: Four times a day (QID) | ORAL | Status: DC | PRN
  Administered 2022-07-05 – 2022-07-06 (×2): 325 mg via ORAL
  Filled 2022-07-02: qty 1

## 2022-07-02 MED ORDER — ONDANSETRON HCL 4 MG PO TABS: 4.0000 mg | ORAL_TABLET | Freq: Four times a day (QID) | ORAL | Status: DC | PRN

## 2022-07-02 MED ORDER — LOSARTAN POTASSIUM 50 MG PO TABS
100.0000 mg | ORAL_TABLET | Freq: Every day | ORAL | Status: DC
  Administered 2022-07-02 – 2022-07-06 (×5): 100 mg via ORAL
  Filled 2022-07-02 (×5): qty 2

## 2022-07-02 MED ORDER — BUPIVACAINE IN DEXTROSE 0.75-8.25 % IT SOLN
INTRATHECAL | Status: DC | PRN
  Administered 2022-07-02: 1.6 mL via INTRATHECAL

## 2022-07-02 MED ORDER — CHLORHEXIDINE GLUCONATE 0.12 % MT SOLN
OROMUCOSAL | Status: AC
  Administered 2022-07-02: 15 mL via OROMUCOSAL
  Filled 2022-07-02: qty 15

## 2022-07-02 MED ORDER — ACETAMINOPHEN 10 MG/ML IV SOLN
INTRAVENOUS | Status: DC | PRN
  Administered 2022-07-02: 1000 mg via INTRAVENOUS

## 2022-07-02 MED ORDER — ONDANSETRON HCL 4 MG/2ML IJ SOLN
4.0000 mg | Freq: Four times a day (QID) | INTRAMUSCULAR | Status: DC | PRN
Start: 2022-07-02 — End: 2022-07-02

## 2022-07-02 MED ORDER — PROPOFOL 10 MG/ML IV BOLUS
INTRAVENOUS | Status: DC | PRN
  Administered 2022-07-02: 10 mg via INTRAVENOUS
  Administered 2022-07-02: 20 mg via INTRAVENOUS
  Administered 2022-07-02 (×2): 10 mg via INTRAVENOUS

## 2022-07-02 MED ORDER — FENTANYL CITRATE (PF) 100 MCG/2ML IJ SOLN: 25.0000 ug | INTRAMUSCULAR | Status: DC | PRN

## 2022-07-02 MED ORDER — MORPHINE SULFATE (PF) 2 MG/ML IV SOLN: 0.5000 mg | INTRAVENOUS | Status: DC | PRN

## 2022-07-02 MED ORDER — FENTANYL CITRATE PF 50 MCG/ML IJ SOSY: 12.5000 ug | PREFILLED_SYRINGE | INTRAMUSCULAR | Status: DC | PRN

## 2022-07-02 MED ORDER — FENTANYL CITRATE PF 50 MCG/ML IJ SOSY: 25.0000 ug | PREFILLED_SYRINGE | INTRAMUSCULAR | Status: DC | PRN

## 2022-07-02 MED ORDER — ACETAMINOPHEN 650 MG RE SUPP: 650.0000 mg | Freq: Four times a day (QID) | RECTAL | Status: DC | PRN

## 2022-07-02 MED ORDER — PROPOFOL 500 MG/50ML IV EMUL
INTRAVENOUS | Status: DC | PRN
  Administered 2022-07-02: 50 ug/kg/min via INTRAVENOUS

## 2022-07-02 MED ORDER — LATANOPROST 0.005 % OP SOLN
1.0000 [drp] | Freq: Every day | OPHTHALMIC | Status: DC
Start: 2022-07-02 — End: 2022-07-06
  Administered 2022-07-02 – 2022-07-05 (×4): 1 [drp] via OPHTHALMIC
  Filled 2022-07-02: qty 2.5

## 2022-07-02 MED ORDER — POTASSIUM CHLORIDE 10 MEQ/100ML IV SOLN
INTRAVENOUS | Status: DC | PRN
  Administered 2022-07-02: 10 meq via INTRAVENOUS

## 2022-07-02 MED ORDER — FENTANYL CITRATE (PF) 250 MCG/5ML IJ SOLN
INTRAMUSCULAR | Status: AC
  Filled 2022-07-02: qty 5

## 2022-07-02 MED ORDER — CHLORHEXIDINE GLUCONATE 4 % EX LIQD
60.0000 mL | Freq: Once | CUTANEOUS | Status: DC
  Filled 2022-07-02: qty 60

## 2022-07-02 MED ORDER — METOPROLOL SUCCINATE ER 50 MG PO TB24
50.0000 mg | ORAL_TABLET | Freq: Every day | ORAL | Status: DC
  Filled 2022-07-02: qty 1

## 2022-07-02 MED ORDER — ACETAMINOPHEN 10 MG/ML IV SOLN: 1000.0000 mg | Freq: Once | INTRAVENOUS | Status: DC | PRN

## 2022-07-02 MED ORDER — ORAL CARE MOUTH RINSE: 15.0000 mL | Freq: Once | OROMUCOSAL | Status: AC

## 2022-07-02 MED ORDER — METOCLOPRAMIDE HCL 5 MG/ML IJ SOLN: 5.0000 mg | Freq: Three times a day (TID) | INTRAMUSCULAR | Status: DC | PRN

## 2022-07-02 MED ORDER — MAGNESIUM SULFATE 2 GM/50ML IV SOLN
2.0000 g | Freq: Once | INTRAVENOUS | Status: AC
  Administered 2022-07-02: 2 g via INTRAVENOUS
  Filled 2022-07-02: qty 50

## 2022-07-02 MED ORDER — ACETAMINOPHEN 325 MG PO TABS
650.0000 mg | ORAL_TABLET | Freq: Four times a day (QID) | ORAL | Status: DC | PRN
  Filled 2022-07-02: qty 2

## 2022-07-02 MED ORDER — HYDRALAZINE HCL 20 MG/ML IJ SOLN: 10.0000 mg | Freq: Four times a day (QID) | INTRAMUSCULAR | Status: DC | PRN

## 2022-07-02 MED ORDER — ONDANSETRON HCL 4 MG/2ML IJ SOLN
INTRAMUSCULAR | Status: AC
  Filled 2022-07-02: qty 2

## 2022-07-02 MED ORDER — ACETAMINOPHEN 500 MG PO TABS
500.0000 mg | ORAL_TABLET | Freq: Four times a day (QID) | ORAL | Status: AC
  Administered 2022-07-02 – 2022-07-03 (×3): 500 mg via ORAL
  Filled 2022-07-02 (×3): qty 1

## 2022-07-02 MED ORDER — 0.9 % SODIUM CHLORIDE (POUR BTL) OPTIME
TOPICAL | Status: DC | PRN
  Administered 2022-07-02: 1000 mL

## 2022-07-02 MED ORDER — ONDANSETRON HCL 4 MG/2ML IJ SOLN
INTRAMUSCULAR | Status: DC | PRN
  Administered 2022-07-02: 4 mg via INTRAVENOUS

## 2022-07-02 MED ORDER — LACTATED RINGERS IV SOLN: INTRAVENOUS | Status: DC

## 2022-07-02 MED ORDER — ACETAMINOPHEN 10 MG/ML IV SOLN
INTRAVENOUS | Status: AC
  Filled 2022-07-02: qty 100

## 2022-07-02 MED ORDER — ONDANSETRON HCL 4 MG/2ML IJ SOLN: 4.0000 mg | Freq: Four times a day (QID) | INTRAMUSCULAR | Status: DC | PRN

## 2022-07-02 MED ORDER — MENTHOL 3 MG MT LOZG: 1.0000 | LOZENGE | OROMUCOSAL | Status: DC | PRN

## 2022-07-02 MED ORDER — INSULIN ASPART 100 UNIT/ML IJ SOLN: 0.0000 [IU] | INTRAMUSCULAR | Status: DC | PRN

## 2022-07-02 MED ORDER — BRINZOLAMIDE 1 % OP SUSP
1.0000 [drp] | Freq: Every day | OPHTHALMIC | Status: DC
  Administered 2022-07-02 – 2022-07-05 (×4): 1 [drp] via OPHTHALMIC
  Filled 2022-07-02: qty 10

## 2022-07-02 MED ORDER — METOCLOPRAMIDE HCL 5 MG PO TABS: 5.0000 mg | ORAL_TABLET | Freq: Three times a day (TID) | ORAL | Status: DC | PRN

## 2022-07-02 MED ORDER — INSULIN ASPART 100 UNIT/ML IJ SOLN
0.0000 [IU] | Freq: Three times a day (TID) | INTRAMUSCULAR | Status: DC
  Administered 2022-07-02: 5 [IU] via SUBCUTANEOUS
  Administered 2022-07-02: 2 [IU] via SUBCUTANEOUS
  Administered 2022-07-03 (×2): 3 [IU] via SUBCUTANEOUS
  Administered 2022-07-03: 2 [IU] via SUBCUTANEOUS
  Administered 2022-07-04: 5 [IU] via SUBCUTANEOUS
  Administered 2022-07-04: 2 [IU] via SUBCUTANEOUS
  Administered 2022-07-04: 3 [IU] via SUBCUTANEOUS
  Administered 2022-07-05: 5 [IU] via SUBCUTANEOUS
  Administered 2022-07-05: 11 [IU] via SUBCUTANEOUS
  Administered 2022-07-05: 5 [IU] via SUBCUTANEOUS
  Administered 2022-07-05: 8 [IU] via SUBCUTANEOUS
  Administered 2022-07-06: 3 [IU] via SUBCUTANEOUS

## 2022-07-02 MED ORDER — HYDROCODONE-ACETAMINOPHEN 7.5-325 MG PO TABS: 1.0000 | ORAL_TABLET | ORAL | Status: DC | PRN

## 2022-07-02 MED ORDER — METOPROLOL SUCCINATE ER 25 MG PO TB24
50.0000 mg | ORAL_TABLET | Freq: Every day | ORAL | Status: DC
  Administered 2022-07-02 – 2022-07-06 (×5): 50 mg via ORAL
  Filled 2022-07-02 (×5): qty 2

## 2022-07-02 MED ORDER — POTASSIUM CHLORIDE CRYS ER 20 MEQ PO TBCR
40.0000 meq | EXTENDED_RELEASE_TABLET | ORAL | Status: AC
  Administered 2022-07-02 (×2): 40 meq via ORAL
  Filled 2022-07-02 (×2): qty 2

## 2022-07-02 MED ORDER — POVIDONE-IODINE 10 % EX SWAB: 2.0000 | Freq: Once | CUTANEOUS | Status: AC

## 2022-07-02 MED ORDER — POLYETHYLENE GLYCOL 3350 17 G PO PACK: 17.0000 g | PACK | Freq: Every day | ORAL | Status: DC | PRN

## 2022-07-02 MED ORDER — FENTANYL CITRATE (PF) 250 MCG/5ML IJ SOLN
INTRAMUSCULAR | Status: DC | PRN
  Administered 2022-07-02: 25 ug via INTRAVENOUS

## 2022-07-02 MED ORDER — ASPIRIN 325 MG PO TBEC
325.0000 mg | DELAYED_RELEASE_TABLET | Freq: Every day | ORAL | Status: DC
  Administered 2022-07-03 – 2022-07-06 (×4): 325 mg via ORAL
  Filled 2022-07-02 (×4): qty 1

## 2022-07-02 MED ORDER — POTASSIUM CHLORIDE 10 MEQ/100ML IV SOLN
10.0000 meq | INTRAVENOUS | Status: AC
  Administered 2022-07-02 (×3): 10 meq via INTRAVENOUS
  Filled 2022-07-02 (×3): qty 100

## 2022-07-02 MED ORDER — LACTATED RINGERS IV SOLN
INTRAVENOUS | Status: AC
Start: 2022-07-02 — End: 2022-07-02

## 2022-07-02 MED ORDER — HYDROCODONE-ACETAMINOPHEN 5-325 MG PO TABS
1.0000 | ORAL_TABLET | ORAL | Status: DC | PRN
  Administered 2022-07-05: 1 via ORAL
  Filled 2022-07-02: qty 1

## 2022-07-02 MED ORDER — CEFAZOLIN SODIUM-DEXTROSE 2-4 GM/100ML-% IV SOLN
2.0000 g | INTRAVENOUS | Status: AC
  Administered 2022-07-02: 2 g via INTRAVENOUS
  Filled 2022-07-02: qty 100

## 2022-07-02 MED ORDER — CHLORHEXIDINE GLUCONATE 0.12 % MT SOLN: 15.0000 mL | Freq: Once | OROMUCOSAL | Status: AC

## 2022-07-02 MED ORDER — POVIDONE-IODINE 10 % EX SWAB
2.0000 | Freq: Once | CUTANEOUS | Status: AC
  Administered 2022-07-02: 2 via TOPICAL

## 2022-07-02 MED ORDER — PHENYLEPHRINE HCL-NACL 20-0.9 MG/250ML-% IV SOLN
INTRAVENOUS | Status: DC | PRN
  Administered 2022-07-02: 50 ug/min via INTRAVENOUS

## 2022-07-02 MED ORDER — PHENOL 1.4 % MT LIQD: 1.0000 | OROMUCOSAL | Status: DC | PRN

## 2022-07-02 MED ORDER — DOCUSATE SODIUM 100 MG PO CAPS
100.0000 mg | ORAL_CAPSULE | Freq: Two times a day (BID) | ORAL | Status: DC
  Administered 2022-07-02 – 2022-07-06 (×8): 100 mg via ORAL
  Filled 2022-07-02 (×8): qty 1

## 2022-07-02 SURGICAL SUPPLY — 34 items
BAG COUNTER SPONGE SURGICOUNT (BAG) ×1 IMPLANT
BIT DRILL CANN LRG QC 5X300 (BIT) IMPLANT
BNDG COHESIVE 4X5 TAN STRL (GAUZE/BANDAGES/DRESSINGS) ×1 IMPLANT
CHLORAPREP W/TINT 26 (MISCELLANEOUS) ×1 IMPLANT
COVER PERINEAL POST (MISCELLANEOUS) ×1 IMPLANT
COVER SURGICAL LIGHT HANDLE (MISCELLANEOUS) ×1 IMPLANT
DRAPE STERI IOBAN 125X83 (DRAPES) ×1 IMPLANT
DRSG MEPILEX BORDER 4X4 (GAUZE/BANDAGES/DRESSINGS) ×3 IMPLANT
ELECT REM PT RETURN 9FT ADLT (ELECTROSURGICAL) ×1
ELECTRODE REM PT RTRN 9FT ADLT (ELECTROSURGICAL) ×1 IMPLANT
GLOVE BIO SURGEON STRL SZ7 (GLOVE) ×1 IMPLANT
GLOVE BIO SURGEON STRL SZ7.5 (GLOVE) ×1 IMPLANT
GLOVE BIOGEL PI IND STRL 8 (GLOVE) ×1 IMPLANT
GOWN STRL REUS W/ TWL LRG LVL3 (GOWN DISPOSABLE) ×2 IMPLANT
GOWN STRL REUS W/ TWL XL LVL3 (GOWN DISPOSABLE) ×2 IMPLANT
GOWN STRL REUS W/TWL LRG LVL3 (GOWN DISPOSABLE) ×2
GOWN STRL REUS W/TWL XL LVL3 (GOWN DISPOSABLE) ×2
GUIDEWIRE BALL NOSE 80CM (WIRE) ×1 IMPLANT
GUIDEWIRE THREADED 2.8 (WIRE) IMPLANT
KIT TURNOVER KIT B (KITS) ×1 IMPLANT
MANIFOLD NEPTUNE II (INSTRUMENTS) ×1 IMPLANT
NS IRRIG 1000ML POUR BTL (IV SOLUTION) ×1 IMPLANT
PACK GENERAL/GYN (CUSTOM PROCEDURE TRAY) ×1 IMPLANT
PAD ARMBOARD 7.5X6 YLW CONV (MISCELLANEOUS) ×2 IMPLANT
SCREW CANN 16 THRD/85 6.5 (Screw) IMPLANT
SCREW CANN 16 THRD/90 6.5 (Screw) IMPLANT
STAPLER VISISTAT 35W (STAPLE) ×1 IMPLANT
SUT VIC AB 1 CT1 27 (SUTURE) ×1
SUT VIC AB 1 CT1 27XBRD ANBCTR (SUTURE) ×1 IMPLANT
SUT VIC AB 2-0 CT1 27 (SUTURE) ×1
SUT VIC AB 2-0 CT1 TAPERPNT 27 (SUTURE) ×1 IMPLANT
TOWEL GREEN STERILE (TOWEL DISPOSABLE) ×1 IMPLANT
TOWEL GREEN STERILE FF (TOWEL DISPOSABLE) ×1 IMPLANT
WATER STERILE IRR 1000ML POUR (IV SOLUTION) ×1 IMPLANT

## 2022-07-02 NOTE — Assessment & Plan Note (Addendum)
   Patient exhibiting mild ulcers criteria including multiple fevers in the emergency department and intermittent tachypnea.  Patient additionally exhibiting concurrent lactic acidosis  Per discussions with family there has been no recent complaints to suggest source of infection  Urinalysis reveals no evidence of urinary tract infection  CT imaging of the abdomen and pelvis revealed no evidence of acute infectious process  Chest x-ray reveals no evidence of pneumonia  COVID-19 PCR testing negative  Neck is supple with no complaints of headache, meningitis is felt to be unlikely.  With the negative work-up above intermittent fevers may simply be due to a transient viral illness  Blood cultures obtained, continue to monitor patient closely, hydrating with intravenous fluids

## 2022-07-02 NOTE — Consult Note (Signed)
Reason for Consult: Right hip injury Referring Physician: [name] is an 88 y.o. female.  HPI: 88 year old female who lives with her daughter.  Normally uses a rollator and is able to walk up and down the stairs every day.  She fell in her living room and had pain and inability to weight-bear.  She had x-rays in the ER which revealed a nondisplaced impacted right femoral neck fracture.  Her daughter notes that she had some increased confusion, but feels that she is mainly back to herself today.  She had a left total hip replacement in 2019.  Past Medical History:  Diagnosis Date   Arthritis    Diabetes mellitus without complication (HCC)    Type II   Hypertension    Hypokalemia     Past Surgical History:  Procedure Laterality Date   ABDOMINAL HYSTERECTOMY     CHALAZION EXCISION  11/06/2011   Procedure: MINOR EXCISION OF CHALAZION;  Surgeon: Vita Erm.;  Location: [facility];  Service: Ophthalmology;  Laterality: Left;   CHALAZION EXCISION  02/02/2012   Procedure: MINOR EXCISION OF CHALAZION;  Surgeon: Vita Erm., MD;  Location: [facility];  Service: Ophthalmology;  Laterality: Left;  left eye upper lid   CHALAZION EXCISION Right 03/28/2013   Procedure: MINOR EXCISION OF CHALAZION upper and lower right eye ;  Surgeon: Vita Erm., MD;  Location: [facility];  Service: Ophthalmology;  Laterality: Right;   CHALAZION EXCISION Right 03/28/2013   Procedure: MINOR EXCISION OF CHALAZION UPPER AND LOWER RIGHT EYE  ;  Surgeon: Vita Erm., MD;  Location: [facility] OR;  Service: Ophthalmology;  Laterality: Right;   COLONOSCOPY     TOTAL HIP ARTHROPLASTY Left 10/26/2017   Procedure: TOTAL HIP ARTHROPLASTY ANTERIOR APPROACH;  Surgeon: Gean Birchwood, MD;  Location: [facility] OR;  Service: Orthopedics;  Laterality: Left;    Family History  Problem Relation Age of Onset   Stroke Mother    Hypertension Mother     Cataracts Mother    Cataracts Father    Heart attack Neg Hx     Social History:  reports that she has never smoked. She has never used smokeless tobacco. She reports that she does not drink alcohol and does not use drugs.  Allergies:  Allergies  Allergen Reactions   Amlodipine Swelling    Swelling in feet    Medications: I have reviewed the patient's current medications.  Results for orders placed or performed during the hospital encounter of 07/01/22 (from the past 48 hour(s))  Culture, blood (Routine x 2)     Status: None (Preliminary result)   Collection Time: 07/01/22  3:43 PM   Specimen: BLOOD RIGHT FOREARM  Result Value Ref Range   Specimen Description BLOOD RIGHT FOREARM    Special Requests      BOTTLES DRAWN AEROBIC AND ANAEROBIC Blood Culture results may not be optimal due to an inadequate volume of blood received in culture bottles   Culture      NO GROWTH < 12 HOURS Performed at [facility], [address], [place], [place] [address]    Report Status PENDING   Culture, blood (Routine x 2)     Status: None (Preliminary result)   Collection Time: 07/01/22  3:48 PM   Specimen: BLOOD LEFT ARM  Result Value Ref Range   Specimen Description BLOOD LEFT ARM    Special Requests  BOTTLES DRAWN AEROBIC AND ANAEROBIC Blood Culture adequate volume   Culture      NO GROWTH < 12 HOURS Performed at [facility], [address], [place], [place] [address]    Report Status PENDING   Comprehensive metabolic panel     Status: Abnormal   Collection Time: 07/01/22  3:50 PM  Result Value Ref Range   Sodium 135 135 - 145 mmol/L   Potassium 3.2 (L) 3.5 - 5.1 mmol/L   Chloride 97 (L) 98 - 111 mmol/L   CO2 21 (L) 22 - 32 mmol/L   Glucose, Bld 273 (H) 70 - 99 mg/dL    Comment: Glucose reference range applies only to samples taken after fasting for at least 8 hours.   BUN 14 8 - 23 mg/dL   Creatinine, Ser 1.96 (H) 0.44 - 1.00 mg/dL   Calcium 9.5 8.9 - 22.2 mg/dL    Total Protein 8.0 6.5 - 8.1 g/dL   Albumin 4.7 3.5 - 5.0 g/dL   AST 35 15 - 41 U/L   ALT 25 0 - 44 U/L   Alkaline Phosphatase 51 38 - 126 U/L   Total Bilirubin 1.4 (H) 0.3 - 1.2 mg/dL   GFR, Estimated 50 (L) >60 mL/min    Comment: (NOTE) Calculated using the CKD-EPI Creatinine Equation (2021)    Anion gap 17 (H) 5 - 15    Comment: Performed at [facility], [address] [place], [place] [address]  Lactic acid, plasma     Status: Abnormal   Collection Time: 07/01/22  3:50 PM  Result Value Ref Range   Lactic Acid, Venous 4.5 (HH) 0.5 - 1.9 mmol/L    Comment: CRITICAL RESULT CALLED TO, READ BACK BY AND VERIFIED WITH M. ROBERTS RN 07/01/22 @1643  BY J. WHITE Performed at [facility], [address], [place], [place]   CBC with Differential     Status: Abnormal   Collection Time: 07/01/22  3:50 PM  Result Value Ref Range   WBC 7.7 4.0 - 10.5 K/uL   RBC 4.56 3.87 - 5.11 MIL/uL   Hemoglobin 13.2 12.0 - 15.0 g/dL   HCT 08/31/22 17.4 - 08.1 %   MCV 89.5 80.0 - 100.0 fL   MCH 28.9 26.0 - 34.0 pg   MCHC 32.4 30.0 - 36.0 g/dL   RDW 44.8 18.5 - 63.1 %   Platelets 187 150 - 400 K/uL   nRBC 0.0 0.0 - 0.2 %   Neutrophils Relative % 93 %   Neutro Abs 7.1 1.7 - 7.7 K/uL   Lymphocytes Relative 4 %   Lymphs Abs 0.3 (L) 0.7 - 4.0 K/uL   Monocytes Relative 3 %   Monocytes Absolute 0.3 0.1 - 1.0 K/uL   Eosinophils Relative 0 %   Eosinophils Absolute 0.0 0.0 - 0.5 K/uL   Basophils Relative 0 %   Basophils Absolute 0.0 0.0 - 0.1 K/uL   Immature Granulocytes 0 %   Abs Immature Granulocytes 0.02 0.00 - 0.07 K/uL    Comment: Performed at [facility], [address], [place], [place]  Protime-INR     Status: None   Collection Time: 07/01/22  3:50 PM  Result Value Ref Range   Prothrombin Time 14.1 11.4 - 15.2 seconds   INR 1.1 0.8 - 1.2    Comment: (NOTE) INR goal varies based on device and disease states. Performed at [facility], [address]  [place], [place] [id number]   Urinalysis, Routine w reflex microscopic Urine, Clean Catch     Status: Abnormal   Collection Time: 07/01/22  4:44 PM  Result Value Ref Range   Color, Urine STRAW (A) YELLOW   APPearance CLEAR CLEAR   Specific Gravity, Urine 1.010 1.005 - 1.030   pH 7.0 5.0 - 8.0   Glucose, UA >=500 (A) NEGATIVE mg/dL   Hgb urine dipstick MODERATE (A) NEGATIVE   Bilirubin Urine NEGATIVE NEGATIVE   Ketones, ur 5 (A) NEGATIVE mg/dL   Protein, ur >=528>=300 (A) NEGATIVE mg/dL   Nitrite NEGATIVE NEGATIVE   Leukocytes,Ua NEGATIVE NEGATIVE   RBC / HPF 0-5 0 - 5 RBC/hpf   WBC, UA 0-5 0 - 5 WBC/hpf   Bacteria, UA NONE SEEN NONE SEEN   Squamous Epithelial / LPF 0-5 0 - 5    Comment: Performed at [facility], [address] [place], [place] [address]  Lactic acid, plasma     Status: Abnormal   Collection Time: 07/01/22  7:16 PM  Result Value Ref Range   Lactic Acid, Venous 2.7 (HH) 0.5 - 1.9 mmol/L    Comment: CRITICAL VALUE NOTED. VALUE IS CONSISTENT WITH PREVIOUSLY REPORTED/CALLED VALUE Performed at [facility], [address] [place], [place] [address]   Resp Panel by RT-PCR (Flu A&B, Covid) Anterior Nasal Swab     Status: None   Collection Time: 07/01/22  8:34 PM   Specimen: Anterior Nasal Swab  Result Value Ref Range   SARS Coronavirus 2 by RT PCR NEGATIVE NEGATIVE    Comment: (NOTE) SARS-CoV-2 target nucleic acids are NOT DETECTED.  The SARS-CoV-2 RNA is generally detectable in upper respiratory specimens during the acute phase of infection. The lowest concentration of SARS-CoV-2 viral copies this assay can detect is 138 copies/mL. A negative result does not preclude SARS-Cov-2 infection and should not be used as the sole basis for treatment or other patient management decisions. A negative result may occur with  improper specimen collection/handling, submission of specimen other than nasopharyngeal swab, presence of viral mutation(s) within the areas  targeted by this assay, and inadequate number of viral copies(<138 copies/mL). A negative result must be combined with clinical observations, patient history, and epidemiological information. The expected result is Negative.  Fact Sheet for Patients:  BloggerCourse.comhttps://www.fda.gov/media/152166/download  Fact Sheet for Healthcare Providers:  SeriousBroker.ithttps://www.fda.gov/media/152162/download  This test is no t yet approved or cleared by the [place] FDA and  has been authorized for detection and/or diagnosis of SARS-CoV-2 by FDA under an Emergency Use Authorization (EUA). This EUA will remain  in effect (meaning this test can be used) for the duration of the COVID-19 declaration under Section 564(b)(1) of the Act, 21 U.S.C.section 360bbb-3(b)(1), unless the authorization is terminated  or revoked sooner.       Influenza A by PCR NEGATIVE NEGATIVE   Influenza B by PCR NEGATIVE NEGATIVE    Comment: (NOTE) The Xpert Xpress SARS-CoV-2/FLU/RSV plus assay is intended as an aid in the diagnosis of influenza from Nasopharyngeal swab specimens and should not be used as a sole basis for treatment. Nasal washings and aspirates are unacceptable for Xpert Xpress SARS-CoV-2/FLU/RSV testing.  Fact Sheet for Patients: BloggerCourse.comhttps://www.fda.gov/media/152166/download  Fact Sheet for Healthcare Providers: SeriousBroker.ithttps://www.fda.gov/media/152162/download  This test is not yet approved or cleared by the [place] FDA and has been authorized for detection and/or diagnosis of SARS-CoV-2 by FDA under an Emergency Use Authorization (EUA). This EUA will remain in effect (meaning this test  can be used) for the duration of the COVID-19 declaration under Section 564(b)(1) of the Act, 21 U.S.C. section 360bbb-3(b)(1), unless the authorization is terminated or revoked.  Performed at [facility], [address], [place], [place] [address]   CBG monitoring, ED     Status: Abnormal   Collection Time: 07/02/22  1:06 AM   Result Value Ref Range   Glucose-Capillary 146 (H) 70 - 99 mg/dL    Comment: Glucose reference range applies only to samples taken after fasting for at least 8 hours.  Ammonia     Status: Abnormal   Collection Time: 07/02/22  1:48 AM  Result Value Ref Range   Ammonia 36 (H) 9 - 35 umol/L    Comment: Performed at [facility], [address], [place], [place] [address]  TSH     Status: None   Collection Time: 07/02/22  1:48 AM  Result Value Ref Range   TSH 1.255 0.350 - 4.500 uIU/mL    Comment: Performed by a 3rd Generation assay with a functional sensitivity of <=0.01 uIU/mL. Performed at [facility], [address] [place], [place] [address]   Folate     Status: None   Collection Time: 07/02/22  1:48 AM  Result Value Ref Range   Folate 9.8 >5.9 ng/mL    Comment: Performed at [facility], [address] [place], [place] [address]  Rapid urine drug screen (hospital performed)     Status: None   Collection Time: 07/02/22  1:48 AM  Result Value Ref Range   Opiates NONE DETECTED NONE DETECTED   Cocaine NONE DETECTED NONE DETECTED   Benzodiazepines NONE DETECTED NONE DETECTED   Amphetamines NONE DETECTED NONE DETECTED   Tetrahydrocannabinol NONE DETECTED NONE DETECTED   Barbiturates NONE DETECTED NONE DETECTED    Comment: (NOTE) DRUG SCREEN FOR MEDICAL PURPOSES ONLY.  IF CONFIRMATION IS NEEDED FOR ANY PURPOSE, NOTIFY LAB WITHIN 5 DAYS.  LOWEST DETECTABLE LIMITS FOR URINE DRUG SCREEN Drug Class                     Cutoff (ng/mL) Amphetamine and metabolites    1000 Barbiturate and metabolites    200 Benzodiazepine                 200 Tricyclics and metabolites     300 Opiates and metabolites        300 Cocaine and metabolites        300 THC                            50 Performed at [facility], [address], [place], [place] [address]   I-Stat venous blood gas, ED     Status: Abnormal   Collection Time: 07/02/22  1:59 AM  Result Value Ref  Range   pH, Ven 7.476 (H) 7.25 - 7.43   pCO2, Ven 34.7 (L) 44 - 60 mmHg   pO2, Ven 66 (H) 32 - 45 mmHg   Bicarbonate 25.6 20.0 - 28.0 mmol/L   TCO2 27 22 - 32 mmol/L   O2 Saturation 94 %   Acid-Base Excess 2.0 0.0 - 2.0 mmol/L   Sodium 137 135 - 145 mmol/L   Potassium 3.1 (L) 3.5 - 5.1 mmol/L   Calcium, Ion 1.12 (L) 1.15 - 1.40 mmol/L   HCT 37.0 36.0 - 46.0 %   Hemoglobin 12.6 12.0 -  15.0 g/dL   Sample type VENOUS   Hemoglobin A1c     Status: Abnormal   Collection Time: 07/02/22  4:18 AM  Result Value Ref Range   Hgb A1c MFr Bld 6.7 (H) 4.8 - 5.6 %    Comment: (NOTE) Pre diabetes:          5.7%-6.4%  Diabetes:              >6.4%  Glycemic control for   <7.0% adults with diabetes    Mean Plasma Glucose 145.59 mg/dL    Comment: Performed at [facility], [address], [place], [place] [address]  CBC with Differential/Platelet     Status: Abnormal   Collection Time: 07/02/22  4:18 AM  Result Value Ref Range   WBC 5.7 4.0 - 10.5 K/uL   RBC 3.91 3.87 - 5.11 MIL/uL   Hemoglobin 11.4 (L) 12.0 - 15.0 g/dL   HCT 60.4 (L) 54.0 - 98.1 %   MCV 87.5 80.0 - 100.0 fL   MCH 29.2 26.0 - 34.0 pg   MCHC 33.3 30.0 - 36.0 g/dL   RDW 19.1 47.8 - 29.5 %   Platelets 128 (L) 150 - 400 K/uL    Comment: REPEATED TO VERIFY   nRBC 0.0 0.0 - 0.2 %   Neutrophils Relative % 88 %   Neutro Abs 5.0 1.7 - 7.7 K/uL   Lymphocytes Relative 7 %   Lymphs Abs 0.4 (L) 0.7 - 4.0 K/uL   Monocytes Relative 4 %   Monocytes Absolute 0.2 0.1 - 1.0 K/uL   Eosinophils Relative 1 %   Eosinophils Absolute 0.1 0.0 - 0.5 K/uL   Basophils Relative 0 %   Basophils Absolute 0.0 0.0 - 0.1 K/uL   Immature Granulocytes 0 %   Abs Immature Granulocytes 0.01 0.00 - 0.07 K/uL    Comment: Performed at [facility], [address] [place], [place] [address]  Comprehensive metabolic panel     Status: Abnormal   Collection Time: 07/02/22  4:18 AM  Result Value Ref Range   Sodium 136 135 - 145 mmol/L   Potassium  2.8 (L) 3.5 - 5.1 mmol/L   Chloride 102 98 - 111 mmol/L   CO2 25 22 - 32 mmol/L   Glucose, Bld 153 (H) 70 - 99 mg/dL    Comment: Glucose reference range applies only to samples taken after fasting for at least 8 hours.   BUN 15 8 - 23 mg/dL   Creatinine, Ser 8.65 0.44 - 1.00 mg/dL   Calcium 9.1 8.9 - 78.4 mg/dL   Total Protein 7.0 6.5 - 8.1 g/dL   Albumin 3.7 3.5 - 5.0 g/dL   AST 25 15 - 41 U/L   ALT 19 0 - 44 U/L   Alkaline Phosphatase 43 38 - 126 U/L   Total Bilirubin 0.9 0.3 - 1.2 mg/dL   GFR, Estimated 53 (L) >60 mL/min    Comment: (NOTE) Calculated using the CKD-EPI Creatinine Equation (2021)    Anion gap 9 5 - 15    Comment: Performed at [facility], [address] [place], [place] [address]  Magnesium     Status: None   Collection Time: 07/02/22  4:18 AM  Result Value Ref Range   Magnesium 1.8 1.7 - 2.4 mg/dL    Comment: Performed at [facility], [address] [place], [place] [address]  Lactic acid, plasma     Status: None   Collection  Time: 07/02/22  4:18 AM  Result Value Ref Range   Lactic Acid, Venous 1.4 0.5 - 1.9 mmol/L    Comment: Performed at [facility], [address] [place], [place] [address]  Protime-INR     Status: Abnormal   Collection Time: 07/02/22  4:18 AM  Result Value Ref Range   Prothrombin Time 15.6 (H) 11.4 - 15.2 seconds   INR 1.3 (H) 0.8 - 1.2    Comment: (NOTE) INR goal varies based on device and disease states. Performed at [facility], [address], [place], [place] [address]   APTT     Status: None   Collection Time: 07/02/22  4:18 AM  Result Value Ref Range   aPTT 26 24 - 36 seconds    Comment: Performed at [facility], [address], [place], [place] [address]  C-reactive protein     Status: Abnormal   Collection Time: 07/02/22  4:18 AM  Result Value Ref Range   CRP 11.0 (H) <1.0 mg/dL    Comment: Performed at [facility], [address], [place], [place] [address]  Vitamin B12      Status: None   Collection Time: 07/02/22  4:18 AM  Result Value Ref Range   Vitamin B-12 268 180 - 914 pg/mL    Comment: (NOTE) This assay is not validated for testing neonatal or myeloproliferative syndrome specimens for Vitamin B12 levels. Performed at [facility], [address], [place], [place] [address]   Sedimentation rate     Status: Abnormal   Collection Time: 07/02/22  4:18 AM  Result Value Ref Range   Sed Rate 23 (H) 0 - 22 mm/hr    Comment: Performed at [facility], [address] [place], [place] [address]  Blood gas, venous     Status: Abnormal   Collection Time: 07/02/22  7:06 AM  Result Value Ref Range   pH, Ven 7.49 (H) 7.25 - 7.43   pCO2, Ven 34 (L) 44 - 60 mmHg   pO2, Ven 73 (H) 32 - 45 mmHg   Bicarbonate 25.9 20.0 - 28.0 mmol/L   Acid-Base Excess 2.9 (H) 0.0 - 2.0 mmol/L   O2 Saturation 96.8 %   Patient temperature 37.0     Comment: Performed at [facility], [address] [place], [place] [address]  Glucose, capillary     Status: Abnormal   Collection Time: 07/02/22  8:34 AM  Result Value Ref Range   Glucose-Capillary 143 (H) 70 - 99 mg/dL    Comment: Glucose reference range applies only to samples taken after fasting for at least 8 hours.    CT ABDOMEN PELVIS W CONTRAST  Result Date: 07/01/2022 CLINICAL DATA:  Acute abdominal pain. EXAM: CT ABDOMEN AND PELVIS WITH CONTRAST TECHNIQUE: Multidetector CT imaging of the abdomen and pelvis was performed using the standard protocol following bolus administration of intravenous contrast. RADIATION DOSE REDUCTION: This exam was performed according to the departmental dose-optimization program which includes automated exposure control, adjustment of the mA and/or kV according to patient size and/or use of iterative reconstruction technique. CONTRAST:  OMNIPAQUE IOHEXOL 300 MG/ML  SOLN COMPARISON:  CT renal stone 03/24/2021.  Lumbar spine CT 05/31/2021. FINDINGS: Lower chest: No acute  abnormality. Hepatobiliary: Hepatic cysts are present. The largest measures 3.7 cm. These are similar to the prior study. Gallbladder and bile ducts are within normal limits. Pancreas: Unremarkable. No pancreatic ductal dilatation or  surrounding inflammatory changes. Spleen: Normal in size without focal abnormality. Adrenals/Urinary Tract: Adrenal glands are unremarkable. Kidneys are normal, without renal calculi, focal lesion, or hydronephrosis. Bladder is unremarkable. Stomach/Bowel: Stomach is within normal limits. No evidence of bowel wall thickening, distention, or inflammatory changes. There is sigmoid colon diverticulosis. The appendix is not visualized. Vascular/Lymphatic: Aortic atherosclerosis. No enlarged abdominal or pelvic lymph nodes. Reproductive: Status post hysterectomy. No adnexal masses. Other: There is some thinning and bulging of the anterior abdominal wall, unchanged. There is no ascites. Musculoskeletal: The bones are diffusely osteopenic. There is moderate compression fracture of L3 which has progressed compared to the prior study. There is minimal retropulsion of fracture fragments along the superior endplate without significant central canal stenosis. Mild chronic compression deformity of T12 is unchanged. Left hip arthroplasty is present. IMPRESSION: 1. Progression of L3 compression deformity. Please correlate clinically for acuity. 2. No other acute localizing process in the abdomen or pelvis. 3. Colonic diverticulosis. 4. Hepatic cysts. 5.  Aortic Atherosclerosis (ICD10-I70.0). Electronically Signed   By: Darliss Cheney M.D.   On: 07/01/2022 21:57   CT Cervical Spine Wo Contrast  Result Date: 07/01/2022 CLINICAL DATA:  Fall. EXAM: CT CERVICAL SPINE WITHOUT CONTRAST TECHNIQUE: Multidetector CT imaging of the cervical spine was performed without intravenous contrast. Multiplanar CT image reconstructions were also generated. RADIATION DOSE REDUCTION: This exam was performed according to  the departmental dose-optimization program which includes automated exposure control, adjustment of the mA and/or kV according to patient size and/or use of iterative reconstruction technique. COMPARISON:  CT of the cervical spine 05/31/2021 FINDINGS: Alignment: No significant listhesis is present. Mild straightening of the normal cervical lordosis is stable. Skull base and vertebrae: Craniocervical junction is normal. Vertebral body heights are normal. No acute or healing fracture is present. Soft tissues and spinal canal: No prevertebral fluid or swelling. No visible canal hematoma. Disc levels:  Stable degenerative changes noted. Upper chest: Scarring is present both lung scratched at scarring at both lung apices is clear. No acute disease is present. IMPRESSION: 1. No acute fracture or traumatic subluxation. 2. Stable degenerative changes of the cervical spine. These results were called by telephone at the time of interpretation on 07/01/2022 at 6:47 pm to provider DR Rush Landmark, who verbally acknowledged these results. Electronically Signed   By: Marin Roberts M.D.   On: 07/01/2022 18:47   CT HEAD WO CONTRAST ( )  Result Date: 07/01/2022 CLINICAL DATA:  Fall today. Mental status changes. EXAM: CT HEAD WITHOUT CONTRAST TECHNIQUE: Contiguous axial images were obtained from the base of the skull through the vertex without intravenous contrast. RADIATION DOSE REDUCTION: This exam was performed according to the departmental dose-optimization program which includes automated exposure control, adjustment of the mA and/or kV according to patient size and/or use of iterative reconstruction technique. COMPARISON:  None Available. FINDINGS: Brain: Moderate generalized atrophy and white matter disease again seen. A small focus of intraventricular hemorrhage is layering posteriorly in the left lateral ventricle. Ventricle size is stable. No significant extra axial hemorrhage is present. The brainstem and cerebellum  are within normal limits. Vascular: No hyperdense vessel or unexpected calcification. Skull: Calvarium is intact. No focal lytic or blastic lesions are present. No significant extracranial soft tissue lesion is present. Sinuses/Orbits: The globes and orbits are within normal limits. The paranasal sinuses and mastoid air cells are clear. IMPRESSION: 1. Small focus of intraventricular hemorrhage layering posteriorly in the left lateral ventricle. 2. Stable atrophy and white matter disease. This likely reflects the sequela  of chronic microvascular ischemia. Critical Value/emergent results were called by telephone at the time of interpretation on 07/01/2022 at 6:43 pm to provider Dr. Rush Landmark, who verbally acknowledged these results. Electronically Signed   By: Marin Roberts M.D.   On: 07/01/2022 18:44   DG Hip Unilat  With Pelvis 2-3 Views Right  Result Date: 07/01/2022 CLINICAL DATA:  Acute RIGHT hip pain following fall. Initial encounter. EXAM: DG HIP (WITH OR WITHOUT PELVIS) 2-3V RIGHT COMPARISON:  03/24/2021 abdominopelvic CT FINDINGS: A subcapital RIGHT femoral neck fracture is noted with mild impaction. There is no evidence of dislocation. LEFT total hip arthroplasty changes are again noted. Degenerative changes of the LOWER lumbar spine are present. IMPRESSION: Subcapital RIGHT femoral neck fracture with mild impaction. Electronically Signed   By: Harmon Pier M.D.   On: 07/01/2022 17:07   DG Chest 2 View  Result Date: 07/01/2022 CLINICAL DATA:  Fall, confusion, pain. EXAM: CHEST - 2 VIEW COMPARISON:  Chest x-ray dated 03/24/2021 FINDINGS: Stable cardiomegaly. Coarse lung markings bilaterally. Chronic bronchitic changes centrally. No confluent opacity to suggest a developing pneumonia. No pleural effusion or pneumothorax is seen. Chronic compression fracture deformities within the upper and lower thoracic spine. No acute-appearing osseous abnormality. IMPRESSION: 1. No acute findings. No evidence of  pneumonia or pulmonary edema. 2. Stable cardiomegaly. 3. Chronic bronchitic changes and chronic interstitial lung disease. Electronically Signed   By: Bary Richard M.D.   On: 07/01/2022 17:02    Review of Systems  All other systems reviewed and are negative.  Blood pressure (!) 183/89, pulse (!) 59, temperature 98.6 F (37 C), temperature source Oral, resp. rate 16, height 5\' 5"  (1.651 m), weight 58.1 kg, SpO2 98 %. Physical Exam HENT:     Head: Atraumatic.  Eyes:     Extraocular Movements: Extraocular movements intact.  Cardiovascular:     Pulses: Normal pulses.  Pulmonary:     Effort: Pulmonary effort is normal.  Musculoskeletal:     Comments: No shortening or rotational deformity of bilateral lower extremities.  She does have pain with rotation of the right lower extremity in the groin.  Distally grossly neurovascularly intact.  Skin:    General: Skin is warm and dry.  Neurological:     Mental Status: She is alert.     Assessment/Plan: Right hip nondisplaced impacted femoral neck fracture I had a long discussion with the patient and her daughter regarding diagnosis prognosis and treatment options.  She is fairly independent and ambulation is very important to her.  At this point I would recommend percutaneous screw fixation to prevent displacement of the fracture and promote early ambulation. We talked about the procedure as well as potential risks and she and her daughter would like to proceed.  All questions were welcomed and answered.  We will plan on doing this this afternoon Appreciate hospitalist management.   07/02/2022, 10:03 AM

## 2022-07-02 NOTE — Anesthesia Preprocedure Evaluation (Addendum)
Anesthesia Evaluation  Patient identified by MRN, date of birth, ID band Patient confused    Reviewed: Allergy & Precautions, NPO status , Patient's Chart, lab work & pertinent test results  Airway Mallampati: I  TM Distance: >3 FB Neck ROM: Full    Dental  (+) Edentulous Upper, Edentulous Lower   Pulmonary    Pulmonary exam normal        Cardiovascular hypertension, Pt. on medications and Pt. on home beta blockers  Rhythm:Regular Rate:Normal     Neuro/Psych    GI/Hepatic   Endo/Other  diabetes, Type 2  Renal/GU      Musculoskeletal   Abdominal Normal abdominal exam  (+)   Peds  Hematology   Anesthesia Other Findings   Reproductive/Obstetrics                            Anesthesia Physical Anesthesia Plan  ASA: 2  Anesthesia Plan: MAC and Spinal   Post-op Pain Management:    Induction: Intravenous  PONV Risk Score and Plan: 2 and Ondansetron, Dexamethasone, Treatment may vary due to age or medical condition and Propofol infusion  Airway Management Planned: Simple Face Mask, Natural Airway and Nasal Cannula  Additional Equipment: None  Intra-op Plan:   Post-operative Plan: Extubation in OR  Informed Consent: I have reviewed the patients History and Physical, chart, labs and discussed the procedure including the risks, benefits and alternatives for the proposed anesthesia with the patient or authorized representative who has indicated his/her understanding and acceptance.     Dental advisory given  Plan Discussed with: CRNA  Anesthesia Plan Comments: (Lab Results      Component                Value               Date                      NA                       136                 07/02/2022                K                        2.8 (L)             07/02/2022                CO2                      25                  07/02/2022                GLUCOSE                   153 (H)             07/02/2022                BUN                      15                  07/02/2022  CREATININE               0.99                07/02/2022                CALCIUM                  9.1                 07/02/2022                GFRNONAA                 53 (L)              07/02/2022           Lab Results      Component                Value               Date                      WBC                      5.7                 07/02/2022                HGB                      11.4 (L)            07/02/2022                HCT                      34.2 (L)            07/02/2022                MCV                      87.5                07/02/2022                PLT                      128 (L)             07/02/2022          )       Anesthesia Quick Evaluation

## 2022-07-02 NOTE — Interval H&P Note (Signed)
History and Physical Interval Note:  07/02/2022 2:35 PM  Alicia Vang  has presented today for surgery, with the diagnosis of Right Hip Fx.  The various methods of treatment have been discussed with the patient and family. After consideration of risks, benefits and other options for treatment, the patient has consented to  Procedure(s): PERCUTANEOUS SCREW FIXATION OF HIP (Right) as a surgical intervention.  The patient's history has been reviewed, patient examined, no change in status, stable for surgery.  I have reviewed the patient's chart and labs.  Questions were answered to the patient's satisfaction.     Glennon Hamilton

## 2022-07-02 NOTE — Op Note (Signed)
07/02/2022  3:46 PM  PATIENT:  Alicia Vang    PRE-OPERATIVE DIAGNOSIS:  Right femoral neck Fracture  POST-OPERATIVE DIAGNOSIS:  Same  PROCEDURE:  PERCUTANEOUS SCREW FIXATION OF HIP  SURGEON:  Glennon Hamilton, MD  PHYSICIAN ASSISTANT: none  ANESTHESIA:   General  PREOPERATIVE INDICATIONS:  Alicia Vang is a  88 y.o. female who fell and was found to have a diagnosis of Right femoral neck Fracture who elected for surgical management.    The risks benefits and alternatives were discussed with the patient preoperatively including but not limited to the risks of infection, bleeding, nerve injury, cardiopulmonary complications, blood clots, malunion, nonunion, avascular necrosis, the need for revision surgery, the potential for conversion to hemiarthroplasty, among others, and the patient was willing to proceed.  OPERATIVE IMPLANTS: 6.5 mm cannulated screws x3  OPERATIVE FINDINGS: Clinical osteoporosis with weak bone, proximal femur  OPERATIVE PROCEDURE: The patient was brought to the operating room and placed in supine position on the Hana bed. IV antibiotics were given. spinal anesthesia administered. The right lower extremity was positioned, without any significant reduction maneuver. The left lower extremity was prepped and draped in usual sterile fashion.  Time out was performed.  Small incision was made distal to the greater trochanter, and 3 guidewires were introduced Into an inverted triangle configuration. The lengths were measured. The reduction was near-anatomic. I opened the cortex with a cannulated drill, and then placed the screws into position. Satisfactory fixation was achieved.  The wounds were irrigated copiously, and repaired with Vicryl with staples and sterile gauze. There no complications and the patient tolerated the procedure well.  The patient will be weightbearing as tolerated

## 2022-07-02 NOTE — Assessment & Plan Note (Addendum)
Secondary to fall. Orthopedic surgery consulted and performed percutaneous screw fixation of the right hip on 9/10. Orthopedic surgery recommendations for WBAT with PT/OT. And Aspirin 325 mg/SCDs for VTE prophylaxis. PT/OT recommending SNF on discharge.

## 2022-07-02 NOTE — Assessment & Plan Note (Signed)
·   Replacing with potassium chloride °· Evaluating for concurrent hypomagnesemia  °· Monitoring potassium levels with serial chemistries. ° °

## 2022-07-02 NOTE — Assessment & Plan Note (Addendum)
Previously diagnosed. Asymptomatic. X-ray imaging suggests nonhealing and somewhat progressing L3 fracture. Conservative management.

## 2022-07-02 NOTE — Assessment & Plan Note (Signed)
   Lactic acidosis likely secondary to volume depletion although infectious process cannot be completely ruled out.  Hydrate patient intravenous isotonic fluids while concurrently of waiting for any evidence of infection  Monitoring serial lactic acid levels to ensure downtrending and resolution.

## 2022-07-02 NOTE — H&P (Signed)
History and Physical    Patient: Alicia Vang MRN: [id number] DOA: 07/01/2022  Date of Service: the patient was seen and examined on 07/02/2022  Patient coming from: Home  Chief Complaint:  Chief Complaint  Patient presents with   Fall   Altered Mental Status    HPI:   88 year old female with past medical history of non-insulin-dependent diabetes mellitus type 2, parkinsonism, hypertension, vitamin D deficiency who presents to [facility] emergency department status post fall with right hip pain.  Patient is a poor historian and therefore the majority the history is been obtained from both discussion with the emergency department staff, review of triage notes and information obtained from the family.  Family reports that for the past 1 to 2 days the patient has been exhibiting increasing confusion.  His confusion has been associated with increasing weakness and a unwitnessed fall the evening of 9/8.  Patient seems to be experiencing sudden onset right hip pain afterwards.  The following morning family noticed that the patient's right hip pain was persisting with continued difficulty with ambulation.  Upon further questioning patient has not had any recent travel, sick contacts, contact with confirmed COVID-19 infection, complaints of abdominal pain, diarrhea or fever.  Due to persisting symptoms family brought the patient into [facility] emergency department for evaluation.  Upon evaluation in the emergency department initial x-ray of the hips revealed a right femoral neck fracture.  EDP discussed case with APP Dannielle Burn with [facility].  Dr. Ave Filter with orthopedic surgery reviewed the images and recommended hospitalization on the hospital service with orthopedic surgery to consult that the patient be n.p.o. after midnight for possible surgical intervention.  ED work-up also revealed a small intraventricular bleed of which images were reviewed with Dr.  Conchita Paris who felt no intervention or repeat imaging was necessary.  The hospitalist group was then called to assess the patient for admission to the hospital.  Review of Systems: Review of Systems  Unable to perform ROS: Mental acuity     Past Medical History:  Diagnosis Date   Arthritis    Diabetes mellitus without complication (HCC)    Type II   Hypertension    Hypokalemia     Past Surgical History:  Procedure Laterality Date   ABDOMINAL HYSTERECTOMY     CHALAZION EXCISION  11/06/2011   Procedure: MINOR EXCISION OF CHALAZION;  Surgeon: Vita Erm.;  Location: [facility];  Service: Ophthalmology;  Laterality: Left;   CHALAZION EXCISION  02/02/2012   Procedure: MINOR EXCISION OF CHALAZION;  Surgeon: Vita Erm., MD;  Location: [facility];  Service: Ophthalmology;  Laterality: Left;  left eye upper lid   CHALAZION EXCISION Right 03/28/2013   Procedure: MINOR EXCISION OF CHALAZION upper and lower right eye ;  Surgeon: Vita Erm., MD;  Location: [facility];  Service: Ophthalmology;  Laterality: Right;   CHALAZION EXCISION Right 03/28/2013   Procedure: MINOR EXCISION OF CHALAZION UPPER AND LOWER RIGHT EYE  ;  Surgeon: Vita Erm., MD;  Location: [facility] OR;  Service: Ophthalmology;  Laterality: Right;   COLONOSCOPY     TOTAL HIP ARTHROPLASTY Left 10/26/2017   Procedure: TOTAL HIP ARTHROPLASTY ANTERIOR APPROACH;  Surgeon: Gean Birchwood, MD;  Location: [facility] OR;  Service: Orthopedics;  Laterality: Left;    Social History:  reports that she has never smoked. She has never used smokeless tobacco. She reports that she does not drink alcohol  and does not use drugs.  Allergies  Allergen Reactions   Amlodipine Swelling    Swelling in feet    Family History  Problem Relation Age of Onset   Stroke Mother    Hypertension Mother    Cataracts Mother    Cataracts Father    Heart attack Neg Hx     Prior to  Admission medications   Medication Sig Start Date End Date Taking? Authorizing Provider  acetaminophen (TYLENOL) 325 MG tablet Take 2 tablets (650 mg total) by mouth every 6 (six) hours as needed for mild pain or headache (fever >/= 101). 10/24/20   Lonia Blood, MD  brinzolamide (AZOPT) 1 % ophthalmic suspension Place 1 drop into both eyes at bedtime. 03/10/21   [provider]  chlorthalidone (HYGROTON) 25 MG tablet Take 1 tablet (25 mg total) by mouth daily. 06/06/21 07/06/21  Zigmund Daniel., MD  diclofenac Sodium (VOLTAREN) 1 % GEL Apply 2 g topically 4 (four) times daily as needed (lower back pain as needed). 06/06/21   Zigmund Daniel., MD  glimepiride (AMARYL) 2 MG tablet Take 4 mg by mouth daily with breakfast.     [provider]  latanoprost (XALATAN) 0.005 % ophthalmic solution Place 1 drop into both eyes at bedtime.    [provider]  losartan (COZAAR) 100 MG tablet Take 100 mg by mouth daily.    [provider]  Metoprolol Succinate 50 MG CS24 Take 50 mg by mouth daily.     [provider]  polyethylene glycol (MIRALAX / GLYCOLAX) 17 g packet Take 17 g by mouth 2 (two) times daily. 06/06/21   Zigmund Daniel., MD  Potassium Chloride ER 20 MEQ TBCR Take 20 mEq by mouth daily. 06/06/21   Zigmund Daniel., MD  XIIDRA 5 % SOLN Place 1 drop into both eyes at bedtime. 07/26/20   [provider]  XARELTO 15 MG TABS tablet Take 15 mg by mouth daily. Patient not taking: Reported on 06/01/2021 03/17/21 06/01/21  [provider]    Physical Exam:  Vitals:   07/01/22 2300 07/01/22 2330 07/01/22 2347 07/02/22 0000  BP: (!) 178/90 (!) 176/75  (!) 169/73  Pulse: 63 61  62  Resp: (!) 21 20  (!) 22  Temp:   98.9 F (37.2 C)   TempSrc:   Oral   SpO2: 94% 96%  96%  Weight:      Height:        Constitutional: Lethargic arousable, oriented x1,, no associated distress.   Skin: no rashes, no lesions, good skin  turgor noted. Eyes: Pupils are equally reactive to light.  No evidence of scleral icterus or conjunctival pallor.  ENMT: Dry mucous membranes noted.  Posterior pharynx clear of any exudate or lesions.   Neck: normal, supple, no masses, no thyromegaly.  No evidence of jugular venous distension.   Respiratory: Bibasilar rales, no evidence of wheezing, normal respiratory effort. No accessory muscle use.  Cardiovascular: Regular rate and rhythm, no murmurs / rubs / gallops. No extremity edema. 2+ pedal pulses. No carotid bruits.  Chest:   Nontender without crepitus or deformity.   Back:   Nontender without crepitus or deformity. Abdomen: Abdomen is soft and nontender.  No evidence of intra-abdominal masses.  Positive bowel sounds noted in all quadrants.   Musculoskeletal: Right leg is deviated outward at rest.  Significant pain with both passive and active range of motion of the right hip.  No acute fractures noted.  Normal muscle tone.  Neurologic: Lethargic but arousable and oriented x1.  Patient is grossly intact.  Patient moving all 4 extremities spontaneously.  Patient is following all commands.  Patient is responsive to verbal stimuli.   Psychiatric: Unable to fully assess due to significant confusion patient currently does not seem to possess insight as to her current situation.  Data Reviewed:  I have personally reviewed and interpreted labs, imaging.  Significant findings are   Unilateral hip x-ray of the right hip revealing subcapital right femoral neck fracture with mild impaction. CT imaging of the abdomen and pelvis with contrast revealing progression of the L3 compression deformity without any other evidence of acute disease in the abdomen.  Lab Results  Component Value Date   WBC 7.7 07/01/2022   HGB 13.2 07/01/2022   HCT 40.8 07/01/2022   MCV 89.5 07/01/2022   PLT 187 07/01/2022   Lab Results  Component Value Date   K 3.2 (L) 07/01/2022   Lab Results  Component Value  Date   BUN 14 07/01/2022   Lab Results  Component Value Date   CREATININE 1.04 (H) 07/01/2022    CXR:   Chest X-ray was personally reviewed.  Coarse interstitial markings noted, likely chronic.  No evidence of focal infiltrates.  No evidence of pleural effusion.  No evidence of pneumothorax.    EKG: Personally reviewed.  Rhythm is normal sinus rhythm with heart rate of 82 bpm.  Extremely poor baseline due to known ongoing tremor.  No dynamic ST segment changes appreciated.   Assessment and Plan: * Closed displaced fracture of right femoral neck (HCC) Patient suffered a mechanical fall resulting in a right femoral neck fracture ER provider discussed case with APP Dannielle Burn and images were reviewed by Dr.Chandler with orthopedic surgery who will evaluate patient in consultation in the morning. Possible operative intervention on 9/10, patient is to remain n.p.o. after midnight. Will keep patient n.p.o. for now  Intravenous volume resuscitation considering concurrent lactic acidosis. as needed low-dose opiate-based analgesics for substantial associated pain  we will additionally place consultation for anesthesia to consider a fascia iliaca block Obtaining vitamin D level   SIRS (systemic inflammatory response syndrome) (HCC) Patient exhibiting mild ulcers criteria including multiple fevers in the emergency department and intermittent tachypnea. Patient additionally exhibiting concurrent lactic acidosis Per discussions with family there has been no recent complaints to suggest source of infection Urinalysis reveals no evidence of urinary tract infection CT imaging of the abdomen and pelvis revealed no evidence of acute infectious process Chest x-ray reveals no evidence of pneumonia COVID-19 PCR testing negative Neck is supple with no complaints of headache, meningitis is felt to be unlikely. With the negative work-up above intermittent fevers may simply be due to a transient viral  illness Blood cultures obtained, continue to monitor patient closely, hydrating with intravenous fluids  Lactic acidosis Lactic acidosis likely secondary to volume depletion although infectious process cannot be completely ruled out. Hydrate patient intravenous isotonic fluids while concurrently of waiting for any evidence of infection Monitoring serial lactic acid levels to ensure downtrending and resolution.   Acute metabolic encephalopathy Patient is exhibiting a 2-day history of increased lethargy and confusion according to family  Considering patient's presentation with multiple SIRS criteria and lactic acidosis and infectious process may be the culprit Considering substantial lactic acidosis hydrating patient with intravenous isotonic fluids Obtaining ammonia level, urinalysis, VBG, urine toxicology screen, RPR, inflammatory markers, CT imaging of the head Attempting to  minimize sedating agents and only providing with low doses of analgesics for right hip pain.   Closed compression fracture of L3 lumbar vertebra, with delayed healing, subsequent encounter Radiographic evidence of nonhealing and somewhat progressing L3 fracture However family denies the patient has been complaining of back pain although family does report the patient has been exhibiting significant kyphosis as of late With the fracture being 89-year-old I am uncertain as to whether or not kyphoplasty would play a role here Obtaining vitamin D level We will discuss options with orthopedic surgery when they come to evaluate the patient tomorrow, if any.   TLSO brace may be the only option.  Traumatic intraventricular hemorrhage (HCC) Extremely small area of intraventricular hemorrhage noted on CT imaging of the head EDP discussed case with Dr. Conchita Paris with neurosurgery who reviewed the images and states that no intervention is necessary and does not even recommend follow-up imaging. We will perform neurochecks every 4  hours and if there is any evidence of clinical deterioration we will repeat head imaging.  Type 2 diabetes mellitus without complication, without long-term current use of insulin (HCC) Patient been placed on Accu-Cheks before every meal and nightly with sliding scale insulin Holding home regimen of hypoglycemics Hemoglobin A1C ordered Diabetic Diet will be initiated once patient is no longer n.p.o.   Essential hypertension Resume patients home regimen of oral antihypertensives Titrate antihypertensive regimen as necessary to achieve adequate BP control PRN intravenous antihypertensives for excessively elevated blood pressure     Hypokalemia Replacing with potassium chloride Evaluating for concurrent hypomagnesemia  Monitoring potassium levels with serial chemistries.        Code Status:  Full code  code status decision has been confirmed with: daughter at the bedside Family Communication: Daughter at the bedside has been updated on plan of care  Consults: EDP discussed case with Dannielle Burn with [facility] neurology who works with Dr. Ave Filter with orthopedic surgery who will come by and evaluate patient in the morning for consultation and possible operative intervention.  EDP additionally discussed case with Dr. Conchita Paris with neurosurgery and while they are not performing a formal consultation they recommended no further intervention or imaging for the intraventricular hemorrhage.  Finally an order has been placed for anesthesia consultation for consideration of right fascia iliaca block.  Severity of Illness:  The appropriate patient status for this patient is INPATIENT. Inpatient status is judged to be reasonable and necessary in order to provide the required intensity of service to ensure the patient's safety. The patient's presenting symptoms, physical exam findings, and initial radiographic and laboratory data in the context of their chronic comorbidities is felt to place  them at high risk for further clinical deterioration. Furthermore, it is not anticipated that the patient will be medically stable for discharge from the hospital within 2 midnights of admission.   * I certify that at the point of admission it is my clinical judgment that the patient will require inpatient hospital care spanning beyond 2 midnights from the point of admission due to high intensity of service, high risk for further deterioration and high frequency of surveillance required.*  Author:  Marinda Elk MD  07/02/2022 1:23 AM

## 2022-07-02 NOTE — Progress Notes (Signed)
Care started prior to midnight in the emergency room patient was admitted early this morning after midnight by Dr. Iona Beard for lobe and I am in current agreement with his assessment and plan.  Additional changes to the plan of care been made accordingly.  The patient is an elderly chronically ill-appearing 88 year old female with a past medical history significant for but limited non-insulin-dependent diabetes mellitus type 2, parkinsonism, hypertension, vitamin D deficiency as well as other comorbidities who presented with a fall with resultant right hip pain.  She is a poor historian and most of the history is obtained from both discussion and the ED staff as well as from family and family had reported for the past 1 to 2 days she had been exhibiting increasing confusion.  Her confusion has been associate with increased weakness and unwitnessed fall in the evening of 06/30/2022.  She experienced pain afterwards of the right hip and the family noticed the patient's right hip was persistent with continued difficulty with ambulation.  Due to worsening persistent symptoms she is brought to the ED for the further evaluation and initial x-ray of the hips revealed a right femoral neck fracture.  EDP discussed with referral today to Dr. Lovena Le who recommended hospitalization with orthopedic surgery consult with surgical intervention in the morning.  In the ED work-up also revealed a small intraventricular bleed which images were reviewed by Dr. Kathyrn Sheriff who felt that based on the size of the small intraventricular bleed no repeat imaging was necessary.  Currently she is being admitted and treated for the following but not limited to:  * Closed displaced fracture of right femoral neck ([place]) Patient suffered a mechanical fall resulting in a right femoral neck fracture ER provider discussed case with APP Joanell Rising and images were reviewed by Dr.Chandler with orthopedic surgery who will evaluate patient in consultation  in the morning. Possible operative intervention on 9/10, patient is to remain n.p.o. after midnight. Will keep patient n.p.o. for now  Intravenous volume resuscitation considering concurrent lactic acidosis. as needed low-dose opiate-based analgesics for substantial associated pain  we will additionally place consultation for anesthesia to consider a fascia iliaca block Obtaining vitamin D level   SIRS (systemic inflammatory response syndrome) ([place]) Patient exhibiting mild ulcers criteria including multiple fevers in the emergency department and intermittent tachypnea. Patient additionally exhibiting concurrent lactic acidosis Per discussions with family there has been no recent complaints to suggest source of infection Urinalysis reveals no evidence of urinary tract infection CT imaging of the abdomen and pelvis revealed no evidence of acute infectious process Chest x-ray reveals no evidence of pneumonia COVID-19 PCR testing negative Neck is supple with no complaints of headache, meningitis is felt to be unlikely. With the negative work-up above intermittent fevers may simply be due to a transient viral illness CRP was elevated at 11.0 and ESR is 23.0 Blood cultures obtained, continue to monitor patient closely, hydrating with intravenous fluids   Lactic acidosis Lactic acidosis likely secondary to volume depletion although infectious process cannot be completely ruled out. Hydrate patient intravenous isotonic fluids while concurrently of waiting for any evidence of infection Monitoring serial lactic acid levels to ensure downtrending and resolution.    Acute metabolic encephalopathy Patient is exhibiting a 2-day history of increased lethargy and confusion according to family  Considering patient's presentation with multiple SIRS criteria and lactic acidosis and infectious process may be the culprit Considering substantial lactic acidosis hydrating patient with intravenous isotonic  fluids Obtaining ammonia level, urinalysis, VBG, urine toxicology  screen, RPR, inflammatory markers, CT imaging of the head Attempting to minimize sedating agents and only providing with low doses of analgesics for right hip pain.   Closed compression fracture of L3 lumbar vertebra, with delayed healing, subsequent encounter Radiographic evidence of nonhealing and somewhat progressing L3 fracture However family denies the patient has been complaining of back pain although family does report the patient has been exhibiting significant kyphosis as of late With the fracture being 36-year-old I am uncertain as to whether or not kyphoplasty would play a role here Obtaining vitamin D level We will discuss options with orthopedic surgery when they come to evaluate the patient tomorrow, if any.   TLSO brace may be the only option.   Traumatic intraventricular hemorrhage (HCC) Extremely small area of intraventricular hemorrhage noted on CT imaging of the head EDP discussed case with Dr. Kathyrn Sheriff with neurosurgery who reviewed the images and states that no intervention is necessary and does not even recommend follow-up imaging. We will perform neurochecks every 4 hours and if there is any evidence of clinical deterioration we will repeat head imaging.   Type 2 diabetes mellitus without complication, without long-term current use of insulin (HCC) Patient been placed on Accu-Cheks before every meal and nightly with sliding scale insulin Holding home regimen of hypoglycemics Hemoglobin A1C ordered Diabetic Diet will be initiated once patient is no longer n.p.o.   Essential hypertension Resume patients home regimen of oral antihypertensives Titrate antihypertensive regimen as necessary to achieve adequate BP control PRN intravenous antihypertensives for excessively elevated blood pressure   Hypokalemia Replacing with potassium chloride p.o. twice daily as well as IV KCl 30 mEq Calcium went from 3.1  -> 2.8 Evaluating for concurrent hypomagnesemia and mag level was 1.8 Monitoring potassium levels with serial chemistries  Normocytic Anemia -Patient is hemoglobin/hematocrit has gone from 12.6/37.2 and is now 11.4/32.2 -Check anemia panel in the a.m. -Continue to monitor for signs and symptoms of bleeding; no overt bleeding noted -Repeat CBC in a.m.  Thrombocytopenia -Patient's platelet count is now 128 -Continue monitor for signs symptoms bleeding; no overt bleeding noted -Repeat CBC in a.m.  We will continue to monitor the patient's clinical response to intervention and repeat blood work in the morning and follow-up on specialist recommendations that she is going for surgical intervention later this afternoon

## 2022-07-02 NOTE — Assessment & Plan Note (Addendum)
Patient is on glimepiride as an outpatient. Recommendation to discontinue on discharge.

## 2022-07-02 NOTE — Transfer of Care (Signed)
Immediate Anesthesia Transfer of Care Note  Patient: Alicia Vang  Procedure(s) Performed: PERCUTANEOUS SCREW FIXATION OF HIP (Right: Hip)  Patient Location: PACU  Anesthesia Type:MAC combined with regional for post-op pain  Level of Consciousness: awake and drowsy  Airway & Oxygen Therapy: Patient Spontanous Breathing and Patient connected to face mask oxygen  Post-op Assessment: Report given to RN and Post -op Vital signs reviewed and stable  Post vital signs: Reviewed and stable  Last Vitals:  Vitals Value Taken Time  BP 94/49 07/02/22 1556  Temp    Pulse 51 07/02/22 1559  Resp 14 07/02/22 1559  SpO2 94 % 07/02/22 1559  Vitals shown include unvalidated device data.  Last Pain:  Vitals:   07/02/22 1425  TempSrc: Oral  PainSc:          Complications: No notable events documented.

## 2022-07-02 NOTE — Assessment & Plan Note (Signed)
   Extremely small area of intraventricular hemorrhage noted on CT imaging of the head  EDP discussed case with Dr. Conchita Paris with neurosurgery who reviewed the images and states that no intervention is necessary and does not even recommend follow-up imaging.  We will perform neurochecks every 4 hours and if there is any evidence of clinical deterioration we will repeat head imaging.

## 2022-07-02 NOTE — Anesthesia Procedure Notes (Signed)
Spinal  Patient location during procedure: OR Start time: 07/02/2022 3:08 PM End time: 07/02/2022 3:10 PM Staffing Performed: anesthesiologist  Anesthesiologist: Atilano Median, DO Performed by: Atilano Median, DO Authorized by: Atilano Median, DO   Preanesthetic Checklist Completed: patient identified, IV checked, site marked, risks and benefits discussed, surgical consent, monitors and equipment checked, pre-op evaluation and timeout performed Spinal Block Patient position: left lateral decubitus Prep: DuraPrep Patient monitoring: heart rate, cardiac monitor, continuous pulse ox and blood pressure Approach: midline Location: L3-4 Injection technique: single-shot Needle Needle type: Pencan  Needle gauge: 24 G Needle length: 10 cm Assessment Events: CSF return Additional Notes Patient identified. Risks/Benefits/Options discussed with patient including but not limited to bleeding, infection, nerve damage, paralysis, failed block, incomplete pain control, headache, blood pressure changes, nausea, vomiting, reactions to medications, itching and postpartum back pain. Confirmed with bedside nurse the patient's most recent platelet count. Confirmed with patient that they are not currently taking any anticoagulation, have any bleeding history or any family history of bleeding disorders. Patient expressed understanding and wished to proceed. All questions were answered. Sterile technique was used throughout the entire procedure. Please see nursing notes for vital signs. Warning signs of high block given to the patient including shortness of breath, tingling/numbness in hands, complete motor block, or any concerning symptoms with instructions to call for help. Patient was given instructions on fall risk and not to get out of bed. All questions and concerns addressed with instructions to call with any issues or inadequate analgesia.

## 2022-07-02 NOTE — Assessment & Plan Note (Addendum)
Continue losartan and Toprol XL. Discontinue chlorthalidone on discharge.

## 2022-07-02 NOTE — H&P (View-Only) (Signed)
Reason for Consult: Right hip injury Referring Physician: Sh[place]b  Alicia Vang is an 88 y.o. female.  HPI: 88-year-old female who lives with her daughter.  Normally uses a rollator and is able to walk up and down the stairs every day.  She fell in her living room and had pain and inability to weight-bear.  She had x-rays in the ER which revealed a nondisplaced impacted right femoral neck fracture.  Her daughter notes that she had some increased confusion, but feels that she is mainly back to herself today.  She had a left total hip replacement in 2019.  Past Medical History:  Diagnosis Date   Arthritis    Diabetes mellitus without complication (HCC)    Type II   Hypertension    Hypokalemia     Past Surgical History:  Procedure Laterality Date   ABDOMINAL HYSTERECTOMY     CHALAZION EXCISION  11/06/2011   Procedure: MINOR EXCISION OF CHALAZION;  Surgeon: Thomas E Sauve Jr.;  Location: [facility];  Service: Ophthalmology;  Laterality: Left;   CHALAZION EXCISION  02/02/2012   Procedure: MINOR EXCISION OF CHALAZION;  Surgeon: Thomas E Wilbourne Jr., MD;  Location: [facility];  Service: Ophthalmology;  Laterality: Left;  left eye upper lid   CHALAZION EXCISION Right 03/28/2013   Procedure: MINOR EXCISION OF CHALAZION upper and lower right eye ;  Surgeon: Thomas E Benfer Jr., MD;  Location: [facility];  Service: Ophthalmology;  Laterality: Right;   CHALAZION EXCISION Right 03/28/2013   Procedure: MINOR EXCISION OF CHALAZION UPPER AND LOWER RIGHT EYE  ;  Surgeon: Thomas E Lassalle Jr., MD;  Location: [facility];  Service: Ophthalmology;  Laterality: Right;   COLONOSCOPY     TOTAL HIP ARTHROPLASTY Left 10/26/2017   Procedure: TOTAL HIP ARTHROPLASTY ANTERIOR APPROACH;  Surgeon: Rowan, Frank, MD;  Location: [facility];  Service: Orthopedics;  Laterality: Left;    Family History  Problem Relation Age of Onset   Stroke Mother    Hypertension Mother     Cataracts Mother    Cataracts Father    Heart attack Neg Hx     Social History:  reports that she has never smoked. She has never used smokeless tobacco. She reports that she does not drink alcohol and does not use drugs.  Allergies:  Allergies  Allergen Reactions   Amlodipine Swelling    Swelling in feet    Medications: I have reviewed the patient's current medications.  Results for orders placed or performed during the hospital encounter of 07/01/22 (from the past 48 hour(s))  Culture, blood (Routine x 2)     Status: None (Preliminary result)   Collection Time: 07/01/22  3:43 PM   Specimen: BLOOD RIGHT FOREARM  Result Value Ref Range   Specimen Description BLOOD RIGHT FOREARM    Special Requests      BOTTLES DRAWN AEROBIC AND ANAEROBIC Blood Culture results may not be optimal due to an inadequate volume of blood received in culture bottles   Culture      NO GROWTH < 12 HOURS Performed at [facility], [address] [place], [place] [address]    Report Status PENDING   Culture, blood (Routine x 2)     Status: None (Preliminary result)   Collection Time: 07/01/22  3:48 PM   Specimen: BLOOD LEFT ARM  Result Value Ref Range   Specimen Description BLOOD LEFT ARM    Special Requests        BOTTLES DRAWN AEROBIC AND ANAEROBIC Blood Culture adequate volume   Culture      NO GROWTH < 12 HOURS Performed at [facility], [address] [place], [place] [address]    Report Status PENDING   Comprehensive metabolic panel     Status: Abnormal   Collection Time: 07/01/22  3:50 PM  Result Value Ref Range   Sodium 135 135 - 145 mmol/L   Potassium 3.2 (L) 3.5 - 5.1 mmol/L   Chloride 97 (L) 98 - 111 mmol/L   CO2 21 (L) 22 - 32 mmol/L   Glucose, Bld 273 (H) 70 - 99 mg/dL    Comment: Glucose reference range applies only to samples taken after fasting for at least 8 hours.   BUN 14 8 - 23 mg/dL   Creatinine, Ser 1.04 (H) 0.44 - 1.00 mg/dL   Calcium 9.5 8.9 - 10.3 mg/dL    Total Protein 8.0 6.5 - 8.1 g/dL   Albumin 4.7 3.5 - 5.0 g/dL   AST 35 15 - 41 U/L   ALT 25 0 - 44 U/L   Alkaline Phosphatase 51 38 - 126 U/L   Total Bilirubin 1.4 (H) 0.3 - 1.2 mg/dL   GFR, Estimated 50 (L) >60 mL/min    Comment: (NOTE) Calculated using the CKD-EPI Creatinine Equation (2021)    Anion gap 17 (H) 5 - 15    Comment: Performed at [facility], [address] [place], [place] [address]  Lactic acid, plasma     Status: Abnormal   Collection Time: 07/01/22  3:50 PM  Result Value Ref Range   Lactic Acid, Venous 4.5 (HH) 0.5 - 1.9 mmol/L    Comment: CRITICAL RESULT CALLED TO, READ BACK BY AND VERIFIED WITH M. ROBERTS RN 07/01/22 @1643 BY J. WHITE Performed at [facility], [address] [place], [place] [address]   CBC with Differential     Status: Abnormal   Collection Time: 07/01/22  3:50 PM  Result Value Ref Range   WBC 7.7 4.0 - 10.5 K/uL   RBC 4.56 3.87 - 5.11 MIL/uL   Hemoglobin 13.2 12.0 - 15.0 g/dL   HCT 40.8 36.0 - 46.0 %   MCV 89.5 80.0 - 100.0 fL   MCH 28.9 26.0 - 34.0 pg   MCHC 32.4 30.0 - 36.0 g/dL   RDW 14.3 11.5 - 15.5 %   Platelets 187 150 - 400 K/uL   nRBC 0.0 0.0 - 0.2 %   Neutrophils Relative % 93 %   Neutro Abs 7.1 1.7 - 7.7 K/uL   Lymphocytes Relative 4 %   Lymphs Abs 0.3 (L) 0.7 - 4.0 K/uL   Monocytes Relative 3 %   Monocytes Absolute 0.3 0.1 - 1.0 K/uL   Eosinophils Relative 0 %   Eosinophils Absolute 0.0 0.0 - 0.5 K/uL   Basophils Relative 0 %   Basophils Absolute 0.0 0.0 - 0.1 K/uL   Immature Granulocytes 0 %   Abs Immature Granulocytes 0.02 0.00 - 0.07 K/uL    Comment: Performed at [facility], [address] [place], [place] [address]  Protime-INR     Status: None   Collection Time: 07/01/22  3:50 PM  Result Value Ref Range   Prothrombin Time 14.1 11.4 - 15.2 seconds   INR 1.1 0.8 - 1.2    Comment: (NOTE) INR goal varies based on device and disease states. Performed at [facility], [address]   [place], [place] [address]   Urinalysis, Routine w reflex microscopic Urine, Clean Catch     Status: Abnormal   Collection Time: 07/01/22  4:44 PM  Result Value Ref Range   Color, Urine STRAW (A) YELLOW   APPearance CLEAR CLEAR   Specific Gravity, Urine 1.010 1.005 - 1.030   pH 7.0 5.0 - 8.0   Glucose, UA >=500 (A) NEGATIVE mg/dL   Hgb urine dipstick MODERATE (A) NEGATIVE   Bilirubin Urine NEGATIVE NEGATIVE   Ketones, ur 5 (A) NEGATIVE mg/dL   Protein, ur >=300 (A) NEGATIVE mg/dL   Nitrite NEGATIVE NEGATIVE   Leukocytes,Ua NEGATIVE NEGATIVE   RBC / HPF 0-5 0 - 5 RBC/hpf   WBC, UA 0-5 0 - 5 WBC/hpf   Bacteria, UA NONE SEEN NONE SEEN   Squamous Epithelial / LPF 0-5 0 - 5    Comment: Performed at [facility], [address] [place], [place] [address]  Lactic acid, plasma     Status: Abnormal   Collection Time: 07/01/22  7:16 PM  Result Value Ref Range   Lactic Acid, Venous 2.7 (HH) 0.5 - 1.9 mmol/L    Comment: CRITICAL VALUE NOTED. VALUE IS CONSISTENT WITH PREVIOUSLY REPORTED/CALLED VALUE Performed at [facility], [address] [place],  [address]   Resp Panel by RT-PCR (Flu A&B, Covid) Anterior Nasal Swab     Status: None   Collection Time: 07/01/22  8:34 PM   Specimen: Anterior Nasal Swab  Result Value Ref Range   SARS Coronavirus 2 by RT PCR NEGATIVE NEGATIVE    Comment: (NOTE) SARS-CoV-2 target nucleic acids are NOT DETECTED.  The SARS-CoV-2 RNA is generally detectable in upper respiratory specimens during the acute phase of infection. The lowest concentration of SARS-CoV-2 viral copies this assay can detect is 138 copies/mL. A negative result does not preclude SARS-Cov-2 infection and should not be used as the sole basis for treatment or other patient management decisions. A negative result may occur with  improper specimen collection/handling, submission of specimen other than nasopharyngeal swab, presence of viral mutation(s) within the areas  targeted by this assay, and inadequate number of viral copies(<138 copies/mL). A negative result must be combined with clinical observations, patient history, and epidemiological information. The expected result is Negative.  Fact Sheet for Patients:  https://www.fda.gov/media/152166/download  Fact Sheet for Healthcare Providers:  https://www.fda.gov/media/152162/download  This test is no t yet approved or cleared by the [place] FDA and  has been authorized for detection and/or diagnosis of SARS-CoV-2 by FDA under an Emergency Use Authorization (EUA). This EUA will remain  in effect (meaning this test can be used) for the duration of the COVID-19 declaration under Section 564(b)(1) of the Act, 21 U.S.C.section 360bbb-3(b)(1), unless the authorization is terminated  or revoked sooner.       Influenza A by PCR NEGATIVE NEGATIVE   Influenza B by PCR NEGATIVE NEGATIVE    Comment: (NOTE) The Xpert Xpress SARS-CoV-2/FLU/RSV plus assay is intended as an aid in the diagnosis of influenza from Nasopharyngeal swab specimens and should not be used as a sole basis for treatment. Nasal washings and aspirates are unacceptable for Xpert Xpress SARS-CoV-2/FLU/RSV testing.  Fact Sheet for Patients: https://www.fda.gov/media/152166/download  Fact Sheet for Healthcare Providers: https://www.fda.gov/media/152162/download  This test is not yet approved or cleared by the [place] FDA and has been authorized for detection and/or diagnosis of SARS-CoV-2 by FDA under an Emergency Use Authorization (EUA). This EUA will remain in effect (meaning this test   can be used) for the duration of the COVID-19 declaration under Section 564(b)(1) of the Act, 21 U.S.C. section 360bbb-3(b)(1), unless the authorization is terminated or revoked.  Performed at [facility], [address] [place], [place] [address]   CBG monitoring, ED     Status: Abnormal   Collection Time: 07/02/22  1:06 AM   Result Value Ref Range   Glucose-Capillary 146 (H) 70 - 99 mg/dL    Comment: Glucose reference range applies only to samples taken after fasting for at least 8 hours.  Ammonia     Status: Abnormal   Collection Time: 07/02/22  1:48 AM  Result Value Ref Range   Ammonia 36 (H) 9 - 35 umol/L    Comment: Performed at [facility], [address] [place], [place] [address]  TSH     Status: None   Collection Time: 07/02/22  1:48 AM  Result Value Ref Range   TSH 1.255 0.350 - 4.500 uIU/mL    Comment: Performed by a 3rd Generation assay with a functional sensitivity of <=0.01 uIU/mL. Performed at [facility], [address] [place], [place] [address]   Folate     Status: None   Collection Time: 07/02/22  1:48 AM  Result Value Ref Range   Folate 9.8 >5.9 ng/mL    Comment: Performed at [facility], [address] [place], [place] [address]  Rapid urine drug screen (hospital performed)     Status: None   Collection Time: 07/02/22  1:48 AM  Result Value Ref Range   Opiates NONE DETECTED NONE DETECTED   Cocaine NONE DETECTED NONE DETECTED   Benzodiazepines NONE DETECTED NONE DETECTED   Amphetamines NONE DETECTED NONE DETECTED   Tetrahydrocannabinol NONE DETECTED NONE DETECTED   Barbiturates NONE DETECTED NONE DETECTED    Comment: (NOTE) DRUG SCREEN FOR MEDICAL PURPOSES ONLY.  IF CONFIRMATION IS NEEDED FOR ANY PURPOSE, NOTIFY LAB WITHIN 5 DAYS.  LOWEST DETECTABLE LIMITS FOR URINE DRUG SCREEN Drug Class                     Cutoff (ng/mL) Amphetamine and metabolites    1000 Barbiturate and metabolites    200 Benzodiazepine                 200 Tricyclics and metabolites     300 Opiates and metabolites        300 Cocaine and metabolites        300 THC                            50 Performed at [facility], [address] [place], [place] [address]   I-Stat venous blood gas, ED     Status: Abnormal   Collection Time: 07/02/22  1:59 AM  Result Value Ref  Range   pH, Ven 7.476 (H) 7.25 - 7.43   pCO2, Ven 34.7 (L) 44 - 60 mmHg   pO2, Ven 66 (H) 32 - 45 mmHg   Bicarbonate 25.6 20.0 - 28.0 mmol/L   TCO2 27 22 - 32 mmol/L   O2 Saturation 94 %   Acid-Base Excess 2.0 0.0 - 2.0 mmol/L   Sodium 137 135 - 145 mmol/L   Potassium 3.1 (L) 3.5 - 5.1 mmol/L   Calcium, Ion 1.12 (L) 1.15 - 1.40 mmol/L   HCT 37.0 36.0 - 46.0 %   Hemoglobin 12.6 12.0 -   15.0 g/dL   Sample type VENOUS   Hemoglobin A1c     Status: Abnormal   Collection Time: 07/02/22  4:18 AM  Result Value Ref Range   Hgb A1c MFr Bld 6.7 (H) 4.8 - 5.6 %    Comment: (NOTE) Pre diabetes:          5.7%-6.4%  Diabetes:              >6.4%  Glycemic control for   <7.0% adults with diabetes    Mean Plasma Glucose 145.59 mg/dL    Comment: Performed at [facility], [address] [place], [place] [address]  CBC with Differential/Platelet     Status: Abnormal   Collection Time: 07/02/22  4:18 AM  Result Value Ref Range   WBC 5.7 4.0 - 10.5 K/uL   RBC 3.91 3.87 - 5.11 MIL/uL   Hemoglobin 11.4 (L) 12.0 - 15.0 g/dL   HCT 34.2 (L) 36.0 - 46.0 %   MCV 87.5 80.0 - 100.0 fL   MCH 29.2 26.0 - 34.0 pg   MCHC 33.3 30.0 - 36.0 g/dL   RDW 14.4 11.5 - 15.5 %   Platelets 128 (L) 150 - 400 K/uL    Comment: REPEATED TO VERIFY   nRBC 0.0 0.0 - 0.2 %   Neutrophils Relative % 88 %   Neutro Abs 5.0 1.7 - 7.7 K/uL   Lymphocytes Relative 7 %   Lymphs Abs 0.4 (L) 0.7 - 4.0 K/uL   Monocytes Relative 4 %   Monocytes Absolute 0.2 0.1 - 1.0 K/uL   Eosinophils Relative 1 %   Eosinophils Absolute 0.1 0.0 - 0.5 K/uL   Basophils Relative 0 %   Basophils Absolute 0.0 0.0 - 0.1 K/uL   Immature Granulocytes 0 %   Abs Immature Granulocytes 0.01 0.00 - 0.07 K/uL    Comment: Performed at [facility], [address] [place], [place] [address]  Comprehensive metabolic panel     Status: Abnormal   Collection Time: 07/02/22  4:18 AM  Result Value Ref Range   Sodium 136 135 - 145 mmol/L   Potassium  2.8 (L) 3.5 - 5.1 mmol/L   Chloride 102 98 - 111 mmol/L   CO2 25 22 - 32 mmol/L   Glucose, Bld 153 (H) 70 - 99 mg/dL    Comment: Glucose reference range applies only to samples taken after fasting for at least 8 hours.   BUN 15 8 - 23 mg/dL   Creatinine, Ser 0.99 0.44 - 1.00 mg/dL   Calcium 9.1 8.9 - 10.3 mg/dL   Total Protein 7.0 6.5 - 8.1 g/dL   Albumin 3.7 3.5 - 5.0 g/dL   AST 25 15 - 41 U/L   ALT 19 0 - 44 U/L   Alkaline Phosphatase 43 38 - 126 U/L   Total Bilirubin 0.9 0.3 - 1.2 mg/dL   GFR, Estimated 53 (L) >60 mL/min    Comment: (NOTE) Calculated using the CKD-EPI Creatinine Equation (2021)    Anion gap 9 5 - 15    Comment: Performed at [facility], [address] [place], [place] [address]  Magnesium     Status: None   Collection Time: 07/02/22  4:18 AM  Result Value Ref Range   Magnesium 1.8 1.7 - 2.4 mg/dL    Comment: Performed at [facility], [address] [place],  [address]  Lactic acid, plasma     Status: None   Collection   Time: 07/02/22  4:18 AM  Result Value Ref Range   Lactic Acid, Venous 1.4 0.5 - 1.9 mmol/L    Comment: Performed at [facility], [address] [place], [place] [address]  Protime-INR     Status: Abnormal   Collection Time: 07/02/22  4:18 AM  Result Value Ref Range   Prothrombin Time 15.6 (H) 11.4 - 15.2 seconds   INR 1.3 (H) 0.8 - 1.2    Comment: (NOTE) INR goal varies based on device and disease states. Performed at [facility], [address] [place], [place] [address]   APTT     Status: None   Collection Time: 07/02/22  4:18 AM  Result Value Ref Range   aPTT 26 24 - 36 seconds    Comment: Performed at [facility], [address] [place], [place] [address]  C-reactive protein     Status: Abnormal   Collection Time: 07/02/22  4:18 AM  Result Value Ref Range   CRP 11.0 (H) <1.0 mg/dL    Comment: Performed at [facility], [address] [place], [place] [address]  Vitamin B12      Status: None   Collection Time: 07/02/22  4:18 AM  Result Value Ref Range   Vitamin B-12 268 180 - 914 pg/mL    Comment: (NOTE) This assay is not validated for testing neonatal or myeloproliferative syndrome specimens for Vitamin B12 levels. Performed at [facility], [address] [place], [place] [address]   Sedimentation rate     Status: Abnormal   Collection Time: 07/02/22  4:18 AM  Result Value Ref Range   Sed Rate 23 (H) 0 - 22 mm/hr    Comment: Performed at [facility], [address] [place], [place] [address]  Blood gas, venous     Status: Abnormal   Collection Time: 07/02/22  7:06 AM  Result Value Ref Range   pH, Ven 7.49 (H) 7.25 - 7.43   pCO2, Ven 34 (L) 44 - 60 mmHg   pO2, Ven 73 (H) 32 - 45 mmHg   Bicarbonate 25.9 20.0 - 28.0 mmol/L   Acid-Base Excess 2.9 (H) 0.0 - 2.0 mmol/L   O2 Saturation 96.8 %   Patient temperature 37.0     Comment: Performed at  Hospital Lab, [address] , [place] [address]  Glucose, capillary     Status: Abnormal   Collection Time: 07/02/22  8:34 AM  Result Value Ref Range   Glucose-Capillary 143 (H) 70 - 99 mg/dL    Comment: Glucose reference range applies only to samples taken after fasting for at least 8 hours.    CT ABDOMEN PELVIS W CONTRAST  Result Date: 07/01/2022 CLINICAL DATA:  Acute abdominal pain. EXAM: CT ABDOMEN AND PELVIS WITH CONTRAST TECHNIQUE: Multidetector CT imaging of the abdomen and pelvis was performed using the standard protocol following bolus administration of intravenous contrast. RADIATION DOSE REDUCTION: This exam was performed according to the departmental dose-optimization program which includes automated exposure control, adjustment of the mA and/or kV according to patient size and/or use of iterative reconstruction technique. CONTRAST:  100mL OMNIPAQUE IOHEXOL 300 MG/ML  SOLN COMPARISON:  CT renal stone 03/24/2021.  Lumbar spine CT 05/31/2021. FINDINGS: Lower chest: No acute  abnormality. Hepatobiliary: Hepatic cysts are present. The largest measures 3.7 cm. These are similar to the prior study. Gallbladder and bile ducts are within normal limits. Pancreas: Unremarkable. No pancreatic ductal dilatation or   surrounding inflammatory changes. Spleen: Normal in size without focal abnormality. Adrenals/Urinary Tract: Adrenal glands are unremarkable. Kidneys are normal, without renal calculi, focal lesion, or hydronephrosis. Bladder is unremarkable. Stomach/Bowel: Stomach is within normal limits. No evidence of bowel wall thickening, distention, or inflammatory changes. There is sigmoid colon diverticulosis. The appendix is not visualized. Vascular/Lymphatic: Aortic atherosclerosis. No enlarged abdominal or pelvic lymph nodes. Reproductive: Status post hysterectomy. No adnexal masses. Other: There is some thinning and bulging of the anterior abdominal wall, unchanged. There is no ascites. Musculoskeletal: The bones are diffusely osteopenic. There is moderate compression fracture of L3 which has progressed compared to the prior study. There is minimal retropulsion of fracture fragments along the superior endplate without significant central canal stenosis. Mild chronic compression deformity of T12 is unchanged. Left hip arthroplasty is present. IMPRESSION: 1. Progression of L3 compression deformity. Please correlate clinically for acuity. 2. No other acute localizing process in the abdomen or pelvis. 3. Colonic diverticulosis. 4. Hepatic cysts. 5.  Aortic Atherosclerosis (ICD10-I70.0). Electronically Signed   By: Amy  Guttmann M.D.   On: 07/01/2022 21:57   CT Cervical Spine Wo Contrast  Result Date: 07/01/2022 CLINICAL DATA:  Fall. EXAM: CT CERVICAL SPINE WITHOUT CONTRAST TECHNIQUE: Multidetector CT imaging of the cervical spine was performed without intravenous contrast. Multiplanar CT image reconstructions were also generated. RADIATION DOSE REDUCTION: This exam was performed according to  the departmental dose-optimization program which includes automated exposure control, adjustment of the mA and/or kV according to patient size and/or use of iterative reconstruction technique. COMPARISON:  CT of the cervical spine 05/31/2021 FINDINGS: Alignment: No significant listhesis is present. Mild straightening of the normal cervical lordosis is stable. Skull base and vertebrae: Craniocervical junction is normal. Vertebral body heights are normal. No acute or healing fracture is present. Soft tissues and spinal canal: No prevertebral fluid or swelling. No visible canal hematoma. Disc levels:  Stable degenerative changes noted. Upper chest: Scarring is present both lung scratched at scarring at both lung apices is clear. No acute disease is present. IMPRESSION: 1. No acute fracture or traumatic subluxation. 2. Stable degenerative changes of the cervical spine. These results were called by telephone at the time of interpretation on 07/01/2022 at 6:47 pm to provider DR TEGELER, who verbally acknowledged these results. Electronically Signed   By: Christopher  Mattern M.D.   On: 07/01/2022 18:47   CT HEAD WO CONTRAST (5MM)  Result Date: 07/01/2022 CLINICAL DATA:  Fall today. Mental status changes. EXAM: CT HEAD WITHOUT CONTRAST TECHNIQUE: Contiguous axial images were obtained from the base of the skull through the vertex without intravenous contrast. RADIATION DOSE REDUCTION: This exam was performed according to the departmental dose-optimization program which includes automated exposure control, adjustment of the mA and/or kV according to patient size and/or use of iterative reconstruction technique. COMPARISON:  None Available. FINDINGS: Brain: Moderate generalized atrophy and white matter disease again seen. A small focus of intraventricular hemorrhage is layering posteriorly in the left lateral ventricle. Ventricle size is stable. No significant extra axial hemorrhage is present. The brainstem and cerebellum  are within normal limits. Vascular: No hyperdense vessel or unexpected calcification. Skull: Calvarium is intact. No focal lytic or blastic lesions are present. No significant extracranial soft tissue lesion is present. Sinuses/Orbits: The globes and orbits are within normal limits. The paranasal sinuses and mastoid air cells are clear. IMPRESSION: 1. Small focus of intraventricular hemorrhage layering posteriorly in the left lateral ventricle. 2. Stable atrophy and white matter disease. This likely reflects the sequela   of chronic microvascular ischemia. Critical Value/emergent results were called by telephone at the time of interpretation on 07/01/2022 at 6:43 pm to provider Dr. Tegeler, who verbally acknowledged these results. Electronically Signed   By: Christopher  Mattern M.D.   On: 07/01/2022 18:44   DG Hip Unilat  With Pelvis 2-3 Views Right  Result Date: 07/01/2022 CLINICAL DATA:  Acute RIGHT hip pain following fall. Initial encounter. EXAM: DG HIP (WITH OR WITHOUT PELVIS) 2-3V RIGHT COMPARISON:  03/24/2021 abdominopelvic CT FINDINGS: A subcapital RIGHT femoral neck fracture is noted with mild impaction. There is no evidence of dislocation. LEFT total hip arthroplasty changes are again noted. Degenerative changes of the LOWER lumbar spine are present. IMPRESSION: Subcapital RIGHT femoral neck fracture with mild impaction. Electronically Signed   By: Jeffrey  Hu M.D.   On: 07/01/2022 17:07   DG Chest 2 View  Result Date: 07/01/2022 CLINICAL DATA:  Fall, confusion, pain. EXAM: CHEST - 2 VIEW COMPARISON:  Chest x-ray dated 03/24/2021 FINDINGS: Stable cardiomegaly. Coarse lung markings bilaterally. Chronic bronchitic changes centrally. No confluent opacity to suggest a developing pneumonia. No pleural effusion or pneumothorax is seen. Chronic compression fracture deformities within the upper and lower thoracic spine. No acute-appearing osseous abnormality. IMPRESSION: 1. No acute findings. No evidence of  pneumonia or pulmonary edema. 2. Stable cardiomegaly. 3. Chronic bronchitic changes and chronic interstitial lung disease. Electronically Signed   By: Stan  Maynard M.D.   On: 07/01/2022 17:02    Review of Systems  All other systems reviewed and are negative.  Blood pressure (!) 183/89, pulse (!) 59, temperature 98.6 F (37 C), temperature source Oral, resp. rate 16, height 5' 5" (1.651 m), weight 58.1 kg, SpO2 98 %. Physical Exam HENT:     Head: Atraumatic.  Eyes:     Extraocular Movements: Extraocular movements intact.  Cardiovascular:     Pulses: Normal pulses.  Pulmonary:     Effort: Pulmonary effort is normal.  Musculoskeletal:     Comments: No shortening or rotational deformity of bilateral lower extremities.  She does have pain with rotation of the right lower extremity in the groin.  Distally grossly neurovascularly intact.  Skin:    General: Skin is warm and dry.  Neurological:     Mental Status: She is alert.     Assessment/Plan: Right hip nondisplaced impacted femoral neck fracture I had a long discussion with the patient and her daughter regarding diagnosis prognosis and treatment options.  She is fairly independent and ambulation is very important to her.  At this point I would recommend percutaneous screw fixation to prevent displacement of the fracture and promote early ambulation. We talked about the procedure as well as potential risks and she and her daughter would like to proceed.  All questions were welcomed and answered.  We will plan on doing this this afternoon Appreciate hospitalist management.   Jesse W Aiyannah Fayad 07/02/2022, 10:03 AM      

## 2022-07-02 NOTE — Anesthesia Postprocedure Evaluation (Signed)
Anesthesia Post Note  Patient: Mindel S Follett  Procedure(s) Performed: PERCUTANEOUS SCREW FIXATION OF HIP (Right: Hip)     Patient location during evaluation: PACU Anesthesia Type: MAC and Spinal Level of consciousness: awake and alert Pain management: pain level controlled Vital Signs Assessment: post-procedure vital signs reviewed and stable Respiratory status: spontaneous breathing, nonlabored ventilation, respiratory function stable and patient connected to nasal cannula oxygen Cardiovascular status: stable and blood pressure returned to baseline Postop Assessment: no apparent nausea or vomiting Anesthetic complications: no   No notable events documented.  Last Vitals:  Vitals:   07/02/22 1759 07/02/22 2035  BP: (!) 160/90 (!) 175/70  Pulse: (!) 54 (!) 54  Resp: 16 18  Temp:    SpO2: 95% 100%    Last Pain:  Vitals:   07/02/22 1653  TempSrc: Oral  PainSc:                  March Rummage Lyndal Alamillo

## 2022-07-02 NOTE — Assessment & Plan Note (Signed)
AMS workup performed, including urine toxicology, urinalysis/urine culture, blood cultures, RPR, ammonia, CRP, Vitamin B12, folate, TSH and CT imaging of the head, without etiology. Encephalopathy now resolved.

## 2022-07-03 ENCOUNTER — Encounter (HOSPITAL_COMMUNITY): Payer: Self-pay | Admitting: Orthopedic Surgery

## 2022-07-03 DIAGNOSIS — I10 Essential (primary) hypertension: Secondary | ICD-10-CM | POA: Diagnosis not present

## 2022-07-03 DIAGNOSIS — G9341 Metabolic encephalopathy: Secondary | ICD-10-CM | POA: Diagnosis not present

## 2022-07-03 DIAGNOSIS — S72001A Fracture of unspecified part of neck of right femur, initial encounter for closed fracture: Secondary | ICD-10-CM | POA: Diagnosis not present

## 2022-07-03 DIAGNOSIS — S32030G Wedge compression fracture of third lumbar vertebra, subsequent encounter for fracture with delayed healing: Secondary | ICD-10-CM | POA: Diagnosis not present

## 2022-07-03 LAB — CBC WITH DIFFERENTIAL/PLATELET
Abs Immature Granulocytes: 0.03 10*3/uL (ref 0.00–0.07)
Basophils Absolute: 0 10*3/uL (ref 0.0–0.1)
Basophils Relative: 1 %
Eosinophils Absolute: 0.2 10*3/uL (ref 0.0–0.5)
Eosinophils Relative: 3 %
HCT: 35.7 % — ABNORMAL LOW (ref 36.0–46.0)
Hemoglobin: 11.6 g/dL — ABNORMAL LOW (ref 12.0–15.0)
Immature Granulocytes: 1 %
Lymphocytes Relative: 11 %
Lymphs Abs: 0.6 10*3/uL — ABNORMAL LOW (ref 0.7–4.0)
MCH: 29.1 pg (ref 26.0–34.0)
MCHC: 32.5 g/dL (ref 30.0–36.0)
MCV: 89.7 fL (ref 80.0–100.0)
Monocytes Absolute: 0.3 10*3/uL (ref 0.1–1.0)
Monocytes Relative: 5 %
Neutro Abs: 4.2 10*3/uL (ref 1.7–7.7)
Neutrophils Relative %: 79 %
Platelets: 146 10*3/uL — ABNORMAL LOW (ref 150–400)
RBC: 3.98 MIL/uL (ref 3.87–5.11)
RDW: 14.6 % (ref 11.5–15.5)
WBC: 5.2 10*3/uL (ref 4.0–10.5)
nRBC: 0 % (ref 0.0–0.2)

## 2022-07-03 LAB — COMPREHENSIVE METABOLIC PANEL
ALT: 14 U/L (ref 0–44)
AST: 20 U/L (ref 15–41)
Albumin: 3.4 g/dL — ABNORMAL LOW (ref 3.5–5.0)
Alkaline Phosphatase: 36 U/L — ABNORMAL LOW (ref 38–126)
Anion gap: 7 (ref 5–15)
BUN: 19 mg/dL (ref 8–23)
CO2: 25 mmol/L (ref 22–32)
Calcium: 8.9 mg/dL (ref 8.9–10.3)
Chloride: 104 mmol/L (ref 98–111)
Creatinine, Ser: 1.14 mg/dL — ABNORMAL HIGH (ref 0.44–1.00)
GFR, Estimated: 45 mL/min — ABNORMAL LOW (ref >60)
Glucose, Bld: 164 mg/dL — ABNORMAL HIGH (ref 70–99)
Potassium: 4.3 mmol/L (ref 3.5–5.1)
Sodium: 136 mmol/L (ref 135–145)
Total Bilirubin: 0.8 mg/dL (ref 0.3–1.2)
Total Protein: 6.5 g/dL (ref 6.5–8.1)

## 2022-07-03 LAB — GLUCOSE, CAPILLARY
Glucose-Capillary: 161 mg/dL — ABNORMAL HIGH (ref 70–99)
Glucose-Capillary: 144 mg/dL — ABNORMAL HIGH (ref 70–99)
Glucose-Capillary: 119 mg/dL — ABNORMAL HIGH (ref 70–99)
Glucose-Capillary: 169 mg/dL — ABNORMAL HIGH (ref 70–99)

## 2022-07-03 LAB — PHOSPHORUS: Phosphorus: 3.5 mg/dL (ref 2.5–4.6)

## 2022-07-03 LAB — MAGNESIUM: Magnesium: 2.2 mg/dL (ref 1.7–2.4)

## 2022-07-03 MED ORDER — LIFITEGRAST 5 % OP SOLN
1.0000 [drp] | Freq: Two times a day (BID) | OPHTHALMIC | Status: DC
  Administered 2022-07-03 – 2022-07-04 (×2): 1 [drp] via OPHTHALMIC
  Filled 2022-07-03 (×4): qty 1

## 2022-07-03 NOTE — Evaluation (Signed)
Physical Therapy Evaluation Patient Details Name: Alicia Vang MRN: [id number] DOB: 03-16-1927 Today's Date: 07/03/2022  History of Present Illness  88 year old female who lives with her daughter.  Normally uses a rollator and is able to walk up and down the stairs every day.  She fell in her living room and had pain and inability to weight-bear.  She had x-rays in the ER which revealed a nondisplaced impacted right femoral neck fracture, traumatic intraventricular hemhorrage (managing conservatively), delayed healing L3 compression fx.; S/p surgical fixation of femur fracture, WBAT;  has a past medical history of Arthritis, Diabetes mellitus without complication (HCC), Hypertension, and Hypokalemia.  Clinical Impression   Pt admitted with above diagnosis. Lives at home with her daughter, in a two-level home (bedroom and bathroom upstairs), with a few steps to enter; Prior to admission, pt was able to walk household distances with RW, and go up and down her flight of steps; her daughter assists with bathing; Presents to PT with R hip pain which is limiting functional mobility and activity tolerance;  Today she needed 2 person max assist for bed mobility and transfers; REcommend post-acute rehab to maximize independence and safety with mobility and ADLs; Pt currently with functional limitations due to the deficits listed below (see PT Problem List). Pt will benefit from skilled PT to increase their independence and safety with mobility to allow discharge to the venue listed below.          Recommendations for follow up therapy are one component of a multi-disciplinary discharge planning process, led by the attending physician.  Recommendations may be updated based on patient status, additional functional criteria and insurance authorization.  Follow Up Recommendations Skilled nursing-short term rehab (<3 hours/day) Can patient physically be transported by private vehicle: No (but perhaps  soon)    Assistance Recommended at Discharge Frequent or constant Supervision/Assistance  Patient can return home with the following  Two people to help with walking and/or transfers;Two people to help with bathing/dressing/bathroom;Help with stairs or ramp for entrance (move bedroom to downstairs)    Equipment Recommendations Wheelchair (measurements PT);Wheelchair cushion (measurements PT);Hospital bed;Other (comment) (Consider moving bedroom downstairs or chair lift for stairs)  Recommendations for Other Services       Functional Status Assessment Patient has had a recent decline in their functional status and demonstrates the ability to make significant improvements in function in a reasonable and predictable amount of time.     Precautions / Restrictions Precautions Precautions: Fall Restrictions Weight Bearing Restrictions: No RLE Weight Bearing: Weight bearing as tolerated      Mobility  Bed Mobility Overal bed mobility: Needs Assistance Bed Mobility: Supine to Sit     Supine to sit: Max assist, +2 for physical assistance, +2 for safety/equipment     General bed mobility comments: Decr tolerance of RLE movement, so opted to cradle hips with bedpad, supporting RLE as pt was turned and assisted into sit    Transfers Overall transfer level: Needs assistance Equipment used: 2 person hand held assist (gait belt and L knee heavily blocked) Transfers: Sit to/from Stand, Bed to chair/wheelchair/BSC Sit to Stand: Max assist, +2 physical assistance, +2 safety/equipment Stand pivot transfers: Max assist, +2 physical assistance, +2 safety/equipment         General transfer comment: stood from bed with bilateral support at shoulder girdles and gait belt, as well as less painful, L knee heavily blocked; Basic pivot towards pt's L performed in teh same way; stood from recliner and supported in  standing to allow for cleaning by nurse    Ambulation/Gait                   Stairs            Wheelchair Mobility    Modified Rankin (Stroke Patients Only)       Balance Overall balance assessment: Needs assistance   Sitting balance-[name] Scale: Poor (approaching Fair)       Standing balance-[name] Scale: Zero                               Pertinent Vitals/Pain Pain Assessment Pain Assessment: Faces Faces Pain Scale: Hurts even more Pain Descriptors / Indicators: Grimacing (states pain is "terrible") Pain Intervention(s): Repositioned, Other (comment) (Pt's nurse was aware of pt's pain)    Home Living Family/patient expects to be discharged to:: Private residence Living Arrangements: Children Available Help at Discharge: Family;Available 24 hours/day (near 24 hours) Type of Home: House Home Access: Stairs to enter Entrance Stairs-Rails: Left Entrance Stairs-Number of Steps: 3 Alternate Level Stairs-Number of Steps: flight Home Layout: Two level;Bed/bath upstairs Home Equipment: Rollator (4 wheels);Cane - quad;Cane - single point      Prior Function Prior Level of Function : Needs assist             Mobility Comments: Typically mostly independent with rollator ADLs Comments: Daughter assists with bathing     Hand Dominance   Dominant Hand: Right    Extremity/Trunk Assessment   Upper Extremity Assessment Upper Extremity Assessment: Generalized weakness (with noted discomfort at IV site)    Lower Extremity Assessment Lower Extremity Assessment: RLE deficits/detail RLE Deficits / Details: Grossly decr AROM and strength, limited by pain postop RLE: Unable to fully assess due to pain       Communication   Communication: HOH  Cognition Arousal/Alertness: Awake/alert Behavior During Therapy: WFL for tasks assessed/performed Overall Cognitive Status: Difficult to assess                                          General Comments General comments (skin integrity, edema, etc.): Pt's  daughter, [name], was present and helpful during session    Exercises     Assessment/Plan    PT Assessment Patient needs continued PT services  PT Problem List Decreased strength;Decreased range of motion;Decreased activity tolerance;Decreased balance;Decreased mobility;Decreased coordination;Decreased cognition;Decreased knowledge of use of DME;Decreased safety awareness;Decreased knowledge of precautions;Pain       PT Treatment Interventions DME instruction;Gait training;Stair training;Functional mobility training;Therapeutic activities;Therapeutic exercise;Balance training;Neuromuscular re-education;Cognitive remediation;Patient/family education    PT Goals (Current goals can be found in the Care Plan section)  Acute Rehab PT Goals Patient Stated Goal: Did not state PT Goal Formulation: With patient/family Time For Goal Achievement: 07/17/22 Potential to Achieve Goals: Good    Frequency Min 3X/week     Co-evaluation               AM-PAC PT "6 Clicks" Mobility  Outcome Measure Help needed turning from your back to your side while in a flat bed without using bedrails?: A Lot Help needed moving from lying on your back to sitting on the side of a flat bed without using bedrails?: Total Help needed moving to and from a bed to a chair (including a wheelchair)?: Total Help needed standing up from a chair  using your arms (e.g., wheelchair or bedside chair)?: Total Help needed to walk in hospital room?: Total Help needed climbing 3-5 steps with a railing? : Total 6 Click Score: 7    End of Session Equipment Utilized During Treatment: Gait belt (bed pads) Activity Tolerance: Patient tolerated treatment well Patient left: in chair;with call bell/phone within reach;with chair alarm set Nurse Communication: Mobility status PT Visit Diagnosis: Other abnormalities of gait and mobility (R26.89);Muscle weakness (generalized) (M62.81);History of falling (Z91.81);Pain Pain -  Right/Left: Right Pain - part of body: Hip    Time: 9811-9147 PT Time Calculation (min) (ACUTE ONLY): 31 min   Charges:   PT Evaluation $PT Eval Moderate Complexity: 1 Mod PT Treatments $Therapeutic Activity: 8-22 mins        Van Clines, PT  Acute Rehabilitation Services Office [phone]   Levi Aland 07/03/2022, 5:32 PM

## 2022-07-03 NOTE — Progress Notes (Signed)
   PATIENT ID: Alicia Vang   1 Day Post-Op Procedure(s) (LRB): PERCUTANEOUS SCREW FIXATION OF HIP (Right)  Subjective: No c/o pain.  Comfortable.  Objective:  Vitals:   07/03/22 0012 07/03/22 0506  BP: (!) 142/69 (!) 170/81  Pulse: (!) 51 65  Resp: 16 18  Temp: 98.3 F (36.8 C) 98 F (36.7 C)  SpO2: 98% 95%    Resting comfortably R hip dressing c/d/i  Labs:  Recent Labs    07/01/22 1550 07/02/22 0159 07/02/22 0418  HGB 13.2 12.6 11.4*   Recent Labs    07/01/22 1550 07/02/22 0159 07/02/22 0418  WBC 7.7  --  5.7  RBC 4.56  --  3.91  HCT 40.8 37.0 34.2*  PLT 187  --  128*   Recent Labs    07/01/22 1550 07/02/22 0159 07/02/22 0418  NA 135 137 136  K 3.2* 3.1* 2.8*  CL 97*  --  102  CO2 21*  --  25  BUN 14  --  15  CREATININE 1.04*  --  0.99  GLUCOSE 273*  --  153*  CALCIUM 9.5  --  9.1    Assessment and Plan:POD 1 s/p CRPP right femoral neck fracture WBAT with PT/OT Rec minimizing narcotics  VTE proph: ASA/SCDs

## 2022-07-03 NOTE — Progress Notes (Signed)
PROGRESS NOTE    Alicia Vang  JKD:[id number] DOB: 1927-06-08 DOA: 07/01/2022 PCP: Kristie Cowman, MD   Brief Narrative:  The patient is an elderly chronically ill-appearing 88 year old female with a past medical history significant for but limited non-insulin-dependent diabetes mellitus type 2, parkinsonism, hypertension, vitamin D deficiency as well as other comorbidities who presented with a fall with resultant right hip pain.  She is a poor historian and most of the history is obtained from both discussion and the ED staff as well as from family and family had reported for the past 1 to 2 days she had been exhibiting increasing confusion.  Her confusion has been associate with increased weakness and unwitnessed fall in the evening of 06/30/2022.  She experienced pain afterwards of the right hip and the family noticed the patient's right hip was persistent with continued difficulty with ambulation.  Due to worsening persistent symptoms she is brought to the ED for the further evaluation and initial x-ray of the hips revealed a right femoral neck fracture.  EDP discussed with referral today to Dr. Lovena Le who recommended hospitalization with orthopedic surgery consult with surgical intervention in the morning.  In the ED work-up also revealed a small intraventricular bleed which images were reviewed by Dr. Kathyrn Sheriff who felt that based on the size of the small intraventricular bleed no repeat imaging was necessary.   She is postoperative day 1 status post a CR PP right femoral neck fracture and Ortho evaluated her and they are recommending weightbearing as tolerated PT OT and recommending minimizing narcotics.  They have initiated aspirin and SCDs for her VTE prophylaxis.  She is doing better today and will need to follow-up for PT OT evaluation.  Assessment and Plan:  Closed displaced fracture of right femoral neck (HCC) status post percutaneous screw fixation of the hip -Patient suffered a  mechanical fall resulting in a right femoral neck fracture -ER provider discussed case with APP Joanell Rising and images were reviewed by Dr.Chandler with orthopedic surgery who will evaluate patient in consultation in the morning. -Underwent operative intervention on 07/02/2022 -Intravenous volume resuscitation considering concurrent lactic acidosis. -C/w as needed low-dose opiate-based analgesics for substantial associated pain; minimize narcotics as available and further pain control per orthopedic surgery -Consultation for anesthesia to consider a fascia iliaca block on the patient -Obtaining vitamin D level -VTE prophylaxis per orthopedic surgery -PT and OT to further evaluate and treat   SIRS (systemic inflammatory response syndrome) ([place]) -Patient exhibiting mild SIRS criteria including multiple fevers in the emergency department and intermittent tachypnea. -Patient additionally exhibiting concurrent lactic acidosis -Per discussions with family there has been no recent complaints to suggest source of infection -Urinalysis reveals no evidence of urinary tract infection -CT imaging of the abdomen and pelvis revealed no evidence of acute infectious process -Chest x-ray reveals no evidence of pneumonia -COVID-19 PCR testing negative -Neck is supple with no complaints of headache, meningitis is felt to be unlikely. -With the negative work-up above intermittent fevers may simply be due to a transient viral illness -CRP was elevated at 11.0 and ESR is 23.0 -Blood cultures obtained, continue to monitor patient closely, hydrating with intravenous fluids   Lactic acidosis -Lactic acidosis likely secondary to volume depletion although infectious process cannot be completely ruled out. -Hydrate patient intravenous isotonic fluids while concurrently of waiting for any evidence of infection -Monitoring serial lactic acid levels to ensure downtrending and resolution. -Lactic acid level went from  2.7 and trended down to 1.4 and resolved  Acute metabolic encephalopathy -Patient is exhibiting a 2-day history of increased lethargy and confusion according to family  -Considering patient's presentation with multiple SIRS criteria and lactic acidosis and infectious process may be the culprit -Considering substantial lactic acidosis hydrating patient with intravenous isotonic fluids -Obtaining ammonia level, urinalysis, VBG, urine toxicology screen, RPR, inflammatory markers, CT imaging of the head -Patient's ammonia level was slightly elevated at 36, urinalysis was relatively unremarkable and showed a clear appearance with straw color with greater than 500 glucose, moderate hemoglobin, no bacteria seen, RPR was nonreactive, ESR was 23 and CRP was 11  -CT of the head done and showed ". Small focus of intraventricular hemorrhage layering posteriorly in the left lateral ventricle.. Stable atrophy and white matter disease. This likely reflects the sequela of chronic microvascular ischemia." -Attempting to minimize sedating agents and only providing with low doses of analgesics for right hip pain.   Closed compression fracture of L3 lumbar vertebra, with delayed healing, subsequent encounter Radiographic evidence of nonhealing and somewhat progressing L3 fracture However family denies the patient has been complaining of back pain although family does report the patient has been exhibiting significant kyphosis as of late With the fracture being 49-year-old I am uncertain as to whether or not kyphoplasty would play a role here Obtaining vitamin D level and pending Orthopedic surgery recommending weightbearing as tolerated and will need to evaluate her back pain when she ambulates TLSO brace may be the only option.   Traumatic intraventricular hemorrhage (HCC) Extremely small area of intraventricular hemorrhage noted on CT imaging of the head EDP discussed case with Dr. Kathyrn Sheriff with neurosurgery  who reviewed the images and states that no intervention is necessary and does not even recommend follow-up imaging. We will perform neurochecks every 4 hours and if there is any evidence of clinical deterioration we will repeat head imaging.   Type 2 diabetes mellitus without complication, without long-term current use of insulin (HCC) Patient been placed on Accu-Cheks before every meal and nightly with sliding scale insulin Holding home regimen of hypoglycemics Hemoglobin A1C ordered and was 6.7 Diabetic Diet will be initiated once patient is no longer n.p.o. CBGs have been ranging from 99-2 50   Essential hypertension Resume patients home regimen of oral antihypertensives Titrate antihypertensive regimen as necessary to achieve adequate BP control PRN intravenous antihypertensives for excessively elevated blood pressure Continue to monitor blood pressures per protocol last blood pressure was 153/59   Hypokalemia Replacing with potassium chloride p.o. twice daily as well as IV KCl 30 mEq Calcium went from 3.1 -> 2.8 and improved to 4.3 after repletion Evaluating for concurrent hypomagnesemia and mag level is now 2.2 after repletion Continue to monitor and replete as necessary and repeat CMP in a.m.   Normocytic Anemia -Patient is hemoglobin/hematocrit has gone from 12.6/37.2 -> 11.4/32.2 yesterday and is now 11.6/35.7 -Check anemia panel in the a.m. -Continue to monitor for signs and symptoms of bleeding; no overt bleeding noted -Repeat CBC in a.m.   Thrombocytopenia -Patient's platelet count is now 128 yesterday and today is 146 -Continue monitor for signs symptoms bleeding; no overt bleeding noted -Repeat CBC in a.m.  CKD stage IIIa -Chronic and patient's BUNs/creatinine has gone from 15/0.99 and has trended up to 19/1.14 -Continue to monitor and trend and avoid nephrotoxic medications, contrast dyes, hypotension and dehydration to ensure adequate renal perfusion and will need  to renally dose medications -Repeat CMP in the a.m.  DVT prophylaxis: SCDs Start: 07/02/22 1645 SCDs Start: 07/02/22 0028  Code Status: Full Code Family Communication: Discussed with daughter at bedside  Disposition Plan:  Level of care: Telemetry Medical Status is: Inpatient Remains inpatient appropriate because: Needs further orthopedic surgery clearance and evaluation by PT and OT   Consultants:  Orthopedic surgery  Procedures:  Percutaneous screw fixation of the hip by Dr. Malena Catholic  Antimicrobials:  Anti-infectives (From admission, onward)    Start     Dose/Rate Route Frequency Ordered Stop   07/03/22 0600  ceFAZolin (ANCEF) IVPB 2g/100 mL premix        2 g 200 mL/hr over 30 Minutes Intravenous On call to O.R. 07/02/22 1344 07/02/22 1520       Subjective: Seen and examined at bedside and she is doing okay denies any pain.  No nausea or vomiting.  No lightheadedness or dizziness.  Feels okay.  No other concerns or complaints at this time.  Objective: Vitals:   07/03/22 0012 07/03/22 0506 07/03/22 0740 07/03/22 0856  BP: (!) 142/69 (!) 170/81  (!) 153/59  Pulse: (!) 51 65  (!) 127  Resp: _0 Temp: 98.3 F (36.8 C) 98 F (36.7 C)  (!) 97.5 F (36.4 C)  TempSrc: Oral Oral  Oral  SpO2: 98% 95% 100% 100%  Weight:      Height:        Intake/Output Summary (Last 24 hours) at 07/03/2022 1601 Last data filed at 07/03/2022 0012 Gross per 24 hour  Intake 180 ml  Output 650 ml  Net -470 ml   Filed Weights   07/01/22 1539 07/02/22 1438  Weight: 58.1 kg 58.1 kg   Examination: Physical Exam:  Constitutional: Thin elderly female in no acute distress appears calm Respiratory: Diminished to auscultation bilaterally, no wheezing, rales, rhonchi or crackles. Normal respiratory effort and patient is not tachypenic. No accessory muscle use.  Unlabored breathing Cardiovascular: RRR, no murmurs / rubs / gallops. S1 and S2 auscultated. No extremity edema.   Abdomen: Soft, non-tender, non-distended. Bowel sounds positive.  GU: Deferred. Musculoskeletal: No clubbing / cyanosis of digits/nails. No joint deformity upper and lower extremities.   Skin: No rashes, lesions, ulcers on limited skin evaluation. No induration; Warm and dry.  Neurologic: CN 2-12 grossly intact with no focal deficits. Romberg sign and cerebellar reflexes not assessed.  Psychiatric: Normal judgment and insight. Alert and oriented x 3. Normal mood and appropriate affect.   Data Reviewed: I have personally reviewed following labs and imaging studies  CBC: Recent Labs  Lab 07/01/22 1550 07/02/22 0159 07/02/22 0418 07/03/22 0913  WBC 7.7  --  5.7 5.2  NEUTROABS 7.1  --  5.0 4.2  HGB 13.2 12.6 11.4* 11.6*  HCT 40.8 37.0 34.2* 35.7*  MCV 89.5  --  87.5 89.7  PLT 187  --  128* 527*   Basic Metabolic Panel: Recent Labs  Lab 07/01/22 1550 07/02/22 0159 07/02/22 0418 07/03/22 0913  NA 135 137 136 136  K 3.2* 3.1* 2.8* 4.3  CL 97*  --  102 104  CO2 21*  --  25 25  GLUCOSE 273*  --  153* 164*  BUN 14  --  15 19  CREATININE 1.04*  --  0.99 1.14*  CALCIUM 9.5  --  9.1 8.9  MG  --   --  1.8 2.2  PHOS  --   --   --  3.5   GFR: Estimated Creatinine Clearance: 28.3 mL/min (A) (by C-G formula based on SCr of 1.14 mg/dL (H)). Liver  Function Tests: Recent Labs  Lab 07/01/22 1550 07/02/22 0418 07/03/22 0913  AST 35 25 20  ALT _0 ALKPHOS 51 43 36*  BILITOT 1.4* 0.9 0.8  PROT 8.0 7.0 6.5  ALBUMIN 4.7 3.7 3.4*   No results for input(s): "LIPASE", "AMYLASE" in the last 168 hours. Recent Labs  Lab 07/02/22 0148  AMMONIA 36*   Coagulation Profile: Recent Labs  Lab 07/01/22 1550 07/02/22 0418  INR 1.1 1.3*   Cardiac Enzymes: No results for input(s): "CKTOTAL", "CKMB", "CKMBINDEX", "TROPONINI" in the last 168 hours. BNP (last 3 results) No results for input(s): "PROBNP" in the last 8760 hours. HbA1C: Recent Labs    07/02/22 0418  HGBA1C 6.7*    CBG: Recent Labs  Lab 07/02/22 1406 07/02/22 1654 07/02/22 2108 07/03/22 0853 07/03/22 1242  GLUCAP 99 115* 215* 161* 144*   Lipid Profile: No results for input(s): "CHOL", "HDL", "LDLCALC", "TRIG", "CHOLHDL", "LDLDIRECT" in the last 72 hours. Thyroid Function Tests: Recent Labs    07/02/22 0148  TSH 1.255   Anemia Panel: Recent Labs    07/02/22 0148 07/02/22 0418  VITAMINB12  --  268  FOLATE 9.8  --    Sepsis Labs: Recent Labs  Lab 07/01/22 1550 07/01/22 1916 07/02/22 0418  LATICACIDVEN 4.5* 2.7* 1.4    Recent Results (from the past 240 hour(s))  Culture, blood (Routine x 2)     Status: None (Preliminary result)   Collection Time: 07/01/22  3:43 PM   Specimen: BLOOD RIGHT FOREARM  Result Value Ref Range Status   Specimen Description BLOOD RIGHT FOREARM  Final   Special Requests   Final    BOTTLES DRAWN AEROBIC AND ANAEROBIC Blood Culture results may not be optimal due to an inadequate volume of blood received in culture bottles   Culture   Final    NO GROWTH 2 DAYS Performed at [facility], [place] [address] [place], [place] [address]    Report Status PENDING  Incomplete  Culture, blood (Routine x 2)     Status: None (Preliminary result)   Collection Time: 07/01/22  3:48 PM   Specimen: BLOOD LEFT ARM  Result Value Ref Range Status   Specimen Description BLOOD LEFT ARM  Final   Special Requests   Final    BOTTLES DRAWN AEROBIC AND ANAEROBIC Blood Culture adequate volume   Culture   Final    NO GROWTH 2 DAYS Performed at [facility], [place] [address] [place], [place] [address]    Report Status PENDING  Incomplete  Resp Panel by RT-PCR (Flu A&B, Covid) Anterior Nasal Swab     Status: None   Collection Time: 07/01/22  8:34 PM   Specimen: Anterior Nasal Swab  Result Value Ref Range Status   SARS Coronavirus 2 by RT PCR NEGATIVE NEGATIVE Final    Comment: (NOTE) SARS-CoV-2 target nucleic acids are NOT DETECTED.  The SARS-CoV-2 RNA is  generally detectable in upper respiratory specimens during the acute phase of infection. The lowest concentration of SARS-CoV-2 viral copies this assay can detect is 138 copies/mL. A negative result does not preclude SARS-Cov-2 infection and should not be used as the sole basis for treatment or other patient management decisions. A negative result may occur with  improper specimen collection/handling, submission of specimen other than nasopharyngeal swab, presence of viral mutation(s) within the areas targeted by this assay, and inadequate number of viral copies(<138 copies/mL). A negative result must be combined with clinical observations, patient  history, and epidemiological information. The expected result is Negative.  Fact Sheet for Patients:  EntrepreneurPulse.com.au  Fact Sheet for Healthcare Providers:  IncredibleEmployment.be  This test is no t yet approved or cleared by the [place] FDA and  has been authorized for detection and/or diagnosis of SARS-CoV-2 by FDA under an Emergency Use Authorization (EUA). This EUA will remain  in effect (meaning this test can be used) for the duration of the COVID-19 declaration under Section 564(b)(1) of the Act, 21 U.S.C.section 360bbb-3(b)(1), unless the authorization is terminated  or revoked sooner.       Influenza A by PCR NEGATIVE NEGATIVE Final   Influenza B by PCR NEGATIVE NEGATIVE Final    Comment: (NOTE) The Xpert Xpress SARS-CoV-2/FLU/RSV plus assay is intended as an aid in the diagnosis of influenza from Nasopharyngeal swab specimens and should not be used as a sole basis for treatment. Nasal washings and aspirates are unacceptable for Xpert Xpress SARS-CoV-2/FLU/RSV testing.  Fact Sheet for Patients: EntrepreneurPulse.com.au  Fact Sheet for Healthcare Providers: IncredibleEmployment.be  This test is not yet approved or cleared by the [place] FDA and has been authorized for detection and/or diagnosis of SARS-CoV-2 by FDA under an Emergency Use Authorization (EUA). This EUA will remain in effect (meaning this test can be used) for the duration of the COVID-19 declaration under Section 564(b)(1) of the Act, 21 U.S.C. section 360bbb-3(b)(1), unless the authorization is terminated or revoked.  Performed at [facility], [place] [address], [place], [place] [address]   Surgical pcr screen     Status: None   Collection Time: 07/02/22  1:50 PM   Specimen: Nasal Mucosa; Nasal Swab  Result Value Ref Range Status   MRSA, PCR NEGATIVE NEGATIVE Final   Staphylococcus aureus NEGATIVE NEGATIVE Final    Comment: (NOTE) The Xpert SA Assay (FDA approved for NASAL specimens in patients 75 years of age and older), is one component of a comprehensive surveillance program. It is not intended to diagnose infection nor to guide or monitor treatment. Performed at [facility], [place] [address] [place], [place] [address]      Radiology Studies: DG HIP UNILAT WITH PELVIS 2-3 VIEWS RIGHT  Result Date: 07/02/2022 CLINICAL DATA:  Fracture right femur, fluoroscopic assistance for internal fixation EXAM: DG HIP (WITH OR WITHOUT PELVIS) 2-3V RIGHT COMPARISON:  07/01/2022 FINDINGS: Fluoroscopic images show internal fixation of subcapital fracture of neck of right femur with 3 surgical screws. Fluoroscopy time 36.3 seconds. Radiation dose 5.13 mGy. IMPRESSION: Fluoroscopic assistance was provided for internal fixation of fracture of neck of right femur. Electronically Signed   By: Elmer Picker M.D.   On: 07/02/2022 16:28   DG C-Arm 1-60 Min-No Report  Result Date: 07/02/2022 Fluoroscopy was utilized by the requesting physician.  No radiographic interpretation.   CT ABDOMEN PELVIS W CONTRAST  Result Date: 07/01/2022 CLINICAL DATA:  Acute abdominal pain. EXAM: CT ABDOMEN AND PELVIS WITH CONTRAST TECHNIQUE: Multidetector CT imaging  of the abdomen and pelvis was performed using the standard protocol following bolus administration of intravenous contrast. RADIATION DOSE REDUCTION: This exam was performed according to the departmental dose-optimization program which includes automated exposure control, adjustment of the mA and/or kV according to patient size and/or use of iterative reconstruction technique. CONTRAST:  134m OMNIPAQUE IOHEXOL 300 MG/ML  SOLN COMPARISON:  CT renal stone 03/24/2021.  Lumbar spine CT 05/31/2021. FINDINGS: Lower chest: No acute abnormality. Hepatobiliary: Hepatic cysts are present. The largest measures 3.7 cm. These are  similar to the prior study. Gallbladder and bile ducts are within normal limits. Pancreas: Unremarkable. No pancreatic ductal dilatation or surrounding inflammatory changes. Spleen: Normal in size without focal abnormality. Adrenals/Urinary Tract: Adrenal glands are unremarkable. Kidneys are normal, without renal calculi, focal lesion, or hydronephrosis. Bladder is unremarkable. Stomach/Bowel: Stomach is within normal limits. No evidence of bowel wall thickening, distention, or inflammatory changes. There is sigmoid colon diverticulosis. The appendix is not visualized. Vascular/Lymphatic: Aortic atherosclerosis. No enlarged abdominal or pelvic lymph nodes. Reproductive: Status post hysterectomy. No adnexal masses. Other: There is some thinning and bulging of the anterior abdominal wall, unchanged. There is no ascites. Musculoskeletal: The bones are diffusely osteopenic. There is moderate compression fracture of L3 which has progressed compared to the prior study. There is minimal retropulsion of fracture fragments along the superior endplate without significant central canal stenosis. Mild chronic compression deformity of T12 is unchanged. Left hip arthroplasty is present. IMPRESSION: 1. Progression of L3 compression deformity. Please correlate clinically for acuity. 2. No other acute localizing  process in the abdomen or pelvis. 3. Colonic diverticulosis. 4. Hepatic cysts. 5.  Aortic Atherosclerosis (ICD10-I70.0). Electronically Signed   By: Ronney Asters M.D.   On: 07/01/2022 21:57   CT Cervical Spine Wo Contrast  Result Date: 07/01/2022 CLINICAL DATA:  Fall. EXAM: CT CERVICAL SPINE WITHOUT CONTRAST TECHNIQUE: Multidetector CT imaging of the cervical spine was performed without intravenous contrast. Multiplanar CT image reconstructions were also generated. RADIATION DOSE REDUCTION: This exam was performed according to the departmental dose-optimization program which includes automated exposure control, adjustment of the mA and/or kV according to patient size and/or use of iterative reconstruction technique. COMPARISON:  CT of the cervical spine 05/31/2021 FINDINGS: Alignment: No significant listhesis is present. Mild straightening of the normal cervical lordosis is stable. Skull base and vertebrae: Craniocervical junction is normal. Vertebral body heights are normal. No acute or healing fracture is present. Soft tissues and spinal canal: No prevertebral fluid or swelling. No visible canal hematoma. Disc levels:  Stable degenerative changes noted. Upper chest: Scarring is present both lung scratched at scarring at both lung apices is clear. No acute disease is present. IMPRESSION: 1. No acute fracture or traumatic subluxation. 2. Stable degenerative changes of the cervical spine. These results were called by telephone at the time of interpretation on 07/01/2022 at 6:47 pm to provider DR Sherry Ruffing, who verbally acknowledged these results. Electronically Signed   By: San Morelle M.D.   On: 07/01/2022 18:47   CT HEAD WO CONTRAST (5MM)  Result Date: 07/01/2022 CLINICAL DATA:  Fall today. Mental status changes. EXAM: CT HEAD WITHOUT CONTRAST TECHNIQUE: Contiguous axial images were obtained from the base of the skull through the vertex without intravenous contrast. RADIATION DOSE REDUCTION: This exam  was performed according to the departmental dose-optimization program which includes automated exposure control, adjustment of the mA and/or kV according to patient size and/or use of iterative reconstruction technique. COMPARISON:  None Available. FINDINGS: Brain: Moderate generalized atrophy and white matter disease again seen. A small focus of intraventricular hemorrhage is layering posteriorly in the left lateral ventricle. Ventricle size is stable. No significant extra axial hemorrhage is present. The brainstem and cerebellum are within normal limits. Vascular: No hyperdense vessel or unexpected calcification. Skull: Calvarium is intact. No focal lytic or blastic lesions are present. No significant extracranial soft tissue lesion is present. Sinuses/Orbits: The globes and orbits are within normal limits. The paranasal sinuses and mastoid air cells are clear. IMPRESSION: 1. Small focus of intraventricular  hemorrhage layering posteriorly in the left lateral ventricle. 2. Stable atrophy and white matter disease. This likely reflects the sequela of chronic microvascular ischemia. Critical Value/emergent results were called by telephone at the time of interpretation on 07/01/2022 at 6:43 pm to provider Dr. Sherry Ruffing, who verbally acknowledged these results. Electronically Signed   By: San Morelle M.D.   On: 07/01/2022 18:44   DG Hip Unilat  With Pelvis 2-3 Views Right  Result Date: 07/01/2022 CLINICAL DATA:  Acute RIGHT hip pain following fall. Initial encounter. EXAM: DG HIP (WITH OR WITHOUT PELVIS) 2-3V RIGHT COMPARISON:  03/24/2021 abdominopelvic CT FINDINGS: A subcapital RIGHT femoral neck fracture is noted with mild impaction. There is no evidence of dislocation. LEFT total hip arthroplasty changes are again noted. Degenerative changes of the LOWER lumbar spine are present. IMPRESSION: Subcapital RIGHT femoral neck fracture with mild impaction. Electronically Signed   By: Margarette Canada M.D.   On:  07/01/2022 17:07   DG Chest 2 View  Result Date: 07/01/2022 CLINICAL DATA:  Fall, confusion, pain. EXAM: CHEST - 2 VIEW COMPARISON:  Chest x-ray dated 03/24/2021 FINDINGS: Stable cardiomegaly. Coarse lung markings bilaterally. Chronic bronchitic changes centrally. No confluent opacity to suggest a developing pneumonia. No pleural effusion or pneumothorax is seen. Chronic compression fracture deformities within the upper and lower thoracic spine. No acute-appearing osseous abnormality. IMPRESSION: 1. No acute findings. No evidence of pneumonia or pulmonary edema. 2. Stable cardiomegaly. 3. Chronic bronchitic changes and chronic interstitial lung disease. Electronically Signed   By: Franki Cabot M.D.   On: 07/01/2022 17:02    Scheduled Meds:  acetaminophen  500 mg Oral Q6H   aspirin EC  325 mg Oral Q breakfast   brinzolamide  1 drop Both Eyes QHS   docusate sodium  100 mg Oral BID   insulin aspart  0-15 Units Subcutaneous TID AC & HS   latanoprost  1 drop Both Eyes QHS   losartan  100 mg Oral Daily   metoprolol succinate  50 mg Oral Daily   Continuous Infusions:   LOS: 2 days   Raiford Noble, DO [facility] Available via Epic secure chat 7am-7pm After these hours, please refer to coverage provider listed on [email or web address] 07/03/2022, 4:01 PM

## 2022-07-04 DIAGNOSIS — S32030G Wedge compression fracture of third lumbar vertebra, subsequent encounter for fracture with delayed healing: Secondary | ICD-10-CM | POA: Diagnosis not present

## 2022-07-04 DIAGNOSIS — I10 Essential (primary) hypertension: Secondary | ICD-10-CM | POA: Diagnosis not present

## 2022-07-04 DIAGNOSIS — S72001A Fracture of unspecified part of neck of right femur, initial encounter for closed fracture: Secondary | ICD-10-CM | POA: Diagnosis not present

## 2022-07-04 DIAGNOSIS — G9341 Metabolic encephalopathy: Secondary | ICD-10-CM | POA: Diagnosis not present

## 2022-07-04 LAB — GLUCOSE, CAPILLARY
Glucose-Capillary: 181 mg/dL — ABNORMAL HIGH (ref 70–99)
Glucose-Capillary: 148 mg/dL — ABNORMAL HIGH (ref 70–99)
Glucose-Capillary: 112 mg/dL — ABNORMAL HIGH (ref 70–99)
Glucose-Capillary: 205 mg/dL — ABNORMAL HIGH (ref 70–99)

## 2022-07-04 LAB — COMPREHENSIVE METABOLIC PANEL
ALT: 14 U/L (ref 0–44)
AST: 21 U/L (ref 15–41)
Albumin: 3.2 g/dL — ABNORMAL LOW (ref 3.5–5.0)
Alkaline Phosphatase: 35 U/L — ABNORMAL LOW (ref 38–126)
Anion gap: 9 (ref 5–15)
BUN: 20 mg/dL (ref 8–23)
CO2: 24 mmol/L (ref 22–32)
Calcium: 8.8 mg/dL — ABNORMAL LOW (ref 8.9–10.3)
Chloride: 103 mmol/L (ref 98–111)
Creatinine, Ser: 0.85 mg/dL (ref 0.44–1.00)
GFR, Estimated: >60 mL/min (ref >60)
Glucose, Bld: 191 mg/dL — ABNORMAL HIGH (ref 70–99)
Potassium: 4.1 mmol/L (ref 3.5–5.1)
Sodium: 136 mmol/L (ref 135–145)
Total Bilirubin: 0.9 mg/dL (ref 0.3–1.2)
Total Protein: 6.4 g/dL — ABNORMAL LOW (ref 6.5–8.1)

## 2022-07-04 LAB — CBC WITH DIFFERENTIAL/PLATELET
Abs Immature Granulocytes: 0.01 10*3/uL (ref 0.00–0.07)
Basophils Absolute: 0 10*3/uL (ref 0.0–0.1)
Basophils Relative: 1 %
Eosinophils Absolute: 0.2 10*3/uL (ref 0.0–0.5)
Eosinophils Relative: 4 %
HCT: 33.8 % — ABNORMAL LOW (ref 36.0–46.0)
Hemoglobin: 11.1 g/dL — ABNORMAL LOW (ref 12.0–15.0)
Immature Granulocytes: 0 %
Lymphocytes Relative: 17 %
Lymphs Abs: 0.8 10*3/uL (ref 0.7–4.0)
MCH: 29.2 pg (ref 26.0–34.0)
MCHC: 32.8 g/dL (ref 30.0–36.0)
MCV: 88.9 fL (ref 80.0–100.0)
Monocytes Absolute: 0.3 10*3/uL (ref 0.1–1.0)
Monocytes Relative: 7 %
Neutro Abs: 3.2 10*3/uL (ref 1.7–7.7)
Neutrophils Relative %: 71 %
Platelets: 145 10*3/uL — ABNORMAL LOW (ref 150–400)
RBC: 3.8 MIL/uL — ABNORMAL LOW (ref 3.87–5.11)
RDW: 14.6 % (ref 11.5–15.5)
WBC: 4.5 10*3/uL (ref 4.0–10.5)
nRBC: 0 % (ref 0.0–0.2)

## 2022-07-04 LAB — PHOSPHORUS: Phosphorus: 3.7 mg/dL (ref 2.5–4.6)

## 2022-07-04 LAB — MAGNESIUM: Magnesium: 2 mg/dL (ref 1.7–2.4)

## 2022-07-04 NOTE — Progress Notes (Signed)
PROGRESS NOTE    Alicia Vang  KCL:[id number] DOB: 10/05/1927 DOA: 07/01/2022 PCP: Kristie Cowman, MD   Brief Narrative:  The patient is an elderly chronically ill-appearing 88 year old female with a past medical history significant for but limited non-insulin-dependent diabetes mellitus type 2, parkinsonism, hypertension, vitamin D deficiency as well as other comorbidities who presented with a fall with resultant right hip pain.  She is a poor historian and most of the history is obtained from both discussion and the ED staff as well as from family and family had reported for the past 1 to 2 days she had been exhibiting increasing confusion.  Her confusion has been associate with increased weakness and unwitnessed fall in the evening of 06/30/2022.  She experienced pain afterwards of the right hip and the family noticed the patient's right hip was persistent with continued difficulty with ambulation.  Due to worsening persistent symptoms she is brought to the ED for the further evaluation and initial x-ray of the hips revealed a right femoral neck fracture.  EDP discussed with referral today to Dr. Lovena Le who recommended hospitalization with orthopedic surgery consult with surgical intervention in the morning.  In the ED work-up also revealed a small intraventricular bleed which images were reviewed by Dr. Kathyrn Sheriff who felt that based on the size of the small intraventricular bleed no repeat imaging was necessary.    She is postoperative day 2 status post a CRPP right femoral neck fracture and Ortho evaluated her and they are recommending weightbearing as tolerated PT OT and recommending minimizing narcotics.  They have initiated aspirin and SCDs for her VTE prophylaxis.  She is doing better today and PT/OT recommending SNF.  Assessment and Plan: Closed displaced fracture of right femoral neck (HCC) status post percutaneous screw fixation of the hip -Patient suffered a mechanical fall resulting in  a right femoral neck fracture -ER provider discussed case with APP Joanell Rising and images were reviewed by Dr.Chandler with orthopedic surgery who will evaluate patient in consultation in the morning. -Underwent operative intervention on 07/02/2022 -Intravenous volume resuscitation considering concurrent lactic acidosis. -C/w as needed low-dose opiate-based analgesics for substantial associated pain; minimize narcotics as available and further pain control per orthopedic surgery -Consultation for anesthesia to consider a fascia iliaca block on the patient -Obtaining vitamin D level and pending  -VTE prophylaxis per orthopedic surgery -PT and OT to further evaluate and treat and recommending SNF   SIRS (systemic inflammatory response syndrome) ([place]) -Patient exhibiting mild SIRS criteria including multiple fevers in the emergency department and intermittent tachypnea. -Patient additionally exhibiting concurrent lactic acidosis -Per discussions with family there has been no recent complaints to suggest source of infection -Urinalysis reveals no evidence of urinary tract infection and UDS Negative  -CT imaging of the abdomen and pelvis revealed no evidence of acute infectious process -Chest x-ray reveals no evidence of pneumonia -COVID-19 PCR testing negative -Neck is supple with no complaints of headache, meningitis is felt to be unlikely. -With the negative work-up above intermittent fevers may simply be due to a transient viral illness -CRP was elevated at 11.0 and ESR is 23.0 -Blood cultures obtained and showed NGTD at 3 Days, continue to monitor patient closely, hydrating with intravenous fluids   Lactic acidosis -Lactic acidosis likely secondary to volume depletion although infectious process cannot be completely ruled out. -Hydrate patient intravenous isotonic fluids while concurrently of waiting for any evidence of infection -Monitoring serial lactic acid levels to ensure downtrending  and resolution. -Lactic acid level  went from 2.7 and trended down to 1.4 and resolved    Acute metabolic encephalopathy, stable -Patient is exhibiting a 2-day history of increased lethargy and confusion according to family  -Considering patient's presentation with multiple SIRS criteria and lactic acidosis and infectious process may be the culprit -Considering substantial lactic acidosis hydrating patient with intravenous isotonic fluids -Obtaining ammonia level, urinalysis, VBG, urine toxicology screen, RPR, inflammatory markers, CT imaging of the head -Patient's ammonia level was slightly elevated at 36, urinalysis was relatively unremarkable and showed a clear appearance with straw color with greater than 500 glucose, moderate hemoglobin, no bacteria seen, RPR was nonreactive, ESR was 23 and CRP was 11  -CT of the head done and showed ". Small focus of intraventricular hemorrhage layering posteriorly in the left lateral ventricle.. Stable atrophy and white matter disease. This likely reflects the sequela of chronic microvascular ischemia." -Attempting to minimize sedating agents and only providing with low doses of analgesics for right hip pain.   Closed compression fracture of L3 lumbar vertebra, with delayed healing, subsequent encounter Radiographic evidence of nonhealing and somewhat progressing L3 fracture However family denies the patient has been complaining of back pain although family does report the patient has been exhibiting significant kyphosis as of late With the fracture being 42-year-old I am uncertain as to whether or not kyphoplasty would play a role here and doubt it would be given her other stability  Obtaining vitamin D level and pending Orthopedic surgery recommending weightbearing as tolerated and will need to evaluate her back pain when she ambulates TLSO brace may be the only option.Continue to monitor Closely and PT/OT recommending SNF   Traumatic intraventricular  hemorrhage ([place]) Extremely small area of intraventricular hemorrhage noted on CT imaging of the head EDP discussed case with Dr. Kathyrn Sheriff with neurosurgery who reviewed the images and states that no intervention is necessary and does not even recommend follow-up imaging. We will perform neurochecks every 4 hours and if there is any evidence of clinical deterioration we will repeat head imaging.   Type 2 diabetes mellitus without complication, without long-term current use of insulin (HCC) Patient been placed on Accu-Cheks before every meal and nightly with sliding scale insulin Holding home regimen of hypoglycemics Hemoglobin A1C ordered and was 6.7 Diabetic Diet will be initiated once patient is no longer n.p.o. CBGs have been ranging from 112-181   Essential hypertension Resume patients home regimen of oral antihypertensives Titrate antihypertensive regimen as necessary to achieve adequate BP control PRN intravenous antihypertensives for excessively elevated blood pressure Continue to monitor blood pressures per protocol last blood pressure was elevated at 172/76   Hypokalemia Potassium  went from 3.1 -> 2.8 and improved to 4.3 after repletion and is now 4.1 Evaluating for concurrent hypomagnesemia and mag level is now 2.0 Continue to monitor and replete as necessary and repeat CMP in a.m.   Normocytic Anemia -Patient is hemoglobin/hematocrit has gone from 12.6/37.2 -> 11.4/32.2 -> 11.6/35.7 -> 11.1/33.8 -Check anemia panel in the a.m. -Continue to monitor for signs and symptoms of bleeding; no overt bleeding noted -Repeat CBC in a.m.   Thrombocytopenia -Patient's platelet count is now 128 -> 146 -> 145 -Continue monitor for signs symptoms bleeding; no overt bleeding noted -Repeat CBC in a.m.   CKD stage IIIa -Chronic and patient's BUNs/creatinine has gone from 15/0.99 -> 19/1.14 -> 20/0.85 -Continue to monitor and trend and avoid nephrotoxic medications, contrast dyes,  hypotension and dehydration to ensure adequate renal perfusion and will need to renally  dose medications -Repeat CMP in the a.m.  Hypoalbuminemia -The patient's albumin level has dropped from 3.7 is now 3.2 -Continue monitor trend and repeat CMP in a.m.   DVT prophylaxis: SCDs Start: 07/02/22 1645 SCDs Start: 07/02/22 0028    Code Status: Full Code Family Communication: No family currently at bedside and called Daughter on number provided in the chart with no response  Disposition Plan:  Level of care: Telemetry Medical Status is: Inpatient Remains inpatient appropriate because: Needs SNF and Insurance Auth   Consultants:  Orthopedic Surgery  Procedures:  Percutaneous screw fixation of the hip by Dr. Malena Catholic  Antimicrobials:  Anti-infectives (From admission, onward)    Start     Dose/Rate Route Frequency Ordered Stop   07/03/22 0600  ceFAZolin (ANCEF) IVPB 2g/100 mL premix        2 g 200 mL/hr over 30 Minutes Intravenous On call to O.R. 07/02/22 1344 07/02/22 1520       Subjective: Seen and examined at bedside and she was doing ok. Had no complaints. Denied any CP or SOB. No other concerns or complaints at this time.   Objective: Vitals:   07/04/22 0452 07/04/22 0800 07/04/22 0905 07/04/22 1548  BP: (!) 169/73  (!) 155/61 (!) 172/76  Pulse: (!) 56  61 (!) 56  Resp: '18  18 18  ' Temp: 97.8 F (36.6 C)  98.1 F (36.7 C) 100 F (37.8 C)  TempSrc: Oral  Oral Oral  SpO2: 100% 98% 98%   Weight:      Height:        Intake/Output Summary (Last 24 hours) at 07/04/2022 1627 Last data filed at 07/04/2022 0913 Gross per 24 hour  Intake 240 ml  Output 1100 ml  Net -860 ml   Filed Weights   07/01/22 1539 07/02/22 1438  Weight: 58.1 kg 58.1 kg   Examination: Physical Exam:  Constitutional: Thin cachectic elderly female in no acute distress appears calm Respiratory: Diminished to auscultation bilaterally, no wheezing, rales, rhonchi or crackles. Normal  respiratory effort and patient is not tachypenic. No accessory muscle use.  Unlabored breathing Cardiovascular: RRR, no murmurs / rubs / gallops. S1 and S2 auscultated.  Abdomen: Soft, non-tender, nondistended.  Bowel sounds positive.  GU: Deferred. Musculoskeletal: No clubbing / cyanosis of digits/nails. No joint deformity upper and lower extremities..  Skin: No rashes, lesions, ulcers on a limited skin evaluation. No induration; Warm and dry.  Neurologic: CN 2-12 grossly intact with no focal deficits. Romberg sign and cerebellar reflexes not assessed.  Psychiatric: Normal mood   Data Reviewed: I have personally reviewed following labs and imaging studies  CBC: Recent Labs  Lab 07/01/22 1550 07/02/22 0159 07/02/22 0418 07/03/22 0913 07/04/22 0903  WBC 7.7  --  5.7 5.2 4.5  NEUTROABS 7.1  --  5.0 4.2 3.2  HGB 13.2 12.6 11.4* 11.6* 11.1*  HCT 40.8 37.0 34.2* 35.7* 33.8*  MCV 89.5  --  87.5 89.7 88.9  PLT 187  --  128* 146* 831*   Basic Metabolic Panel: Recent Labs  Lab 07/01/22 1550 07/02/22 0159 07/02/22 0418 07/03/22 0913 07/04/22 0903  NA 135 137 136 136 136  K 3.2* 3.1* 2.8* 4.3 4.1  CL 97*  --  102 104 103  CO2 21*  --  '25 25 24  ' GLUCOSE 273*  --  153* 164* 191*  BUN 14  --  '15 19 20  ' CREATININE 1.04*  --  0.99 1.14* 0.85  CALCIUM 9.5  --  9.1 8.9 8.8*  MG  --   --  1.8 2.2 2.0  PHOS  --   --   --  3.5 3.7   GFR: Estimated Creatinine Clearance: 38 mL/min (by C-G formula based on SCr of 0.85 mg/dL). Liver Function Tests: Recent Labs  Lab 07/01/22 1550 07/02/22 0418 07/03/22 0913 07/04/22 0903  AST 35 '25 20 21  ' ALT '25 19 14 14  ' ALKPHOS 51 43 36* 35*  BILITOT 1.4* 0.9 0.8 0.9  PROT 8.0 7.0 6.5 6.4*  ALBUMIN 4.7 3.7 3.4* 3.2*   No results for input(s): "LIPASE", "AMYLASE" in the last 168 hours. Recent Labs  Lab 07/02/22 0148  AMMONIA 36*   Coagulation Profile: Recent Labs  Lab 07/01/22 1550 07/02/22 0418  INR 1.1 1.3*   Cardiac Enzymes: No  results for input(s): "CKTOTAL", "CKMB", "CKMBINDEX", "TROPONINI" in the last 168 hours. BNP (last 3 results) No results for input(s): "PROBNP" in the last 8760 hours. HbA1C: Recent Labs    07/02/22 0418  HGBA1C 6.7*   CBG: Recent Labs  Lab 07/03/22 1757 07/03/22 2123 07/04/22 0909 07/04/22 1154 07/04/22 1548  GLUCAP 119* 169* 181* 148* 112*   Lipid Profile: No results for input(s): "CHOL", "HDL", "LDLCALC", "TRIG", "CHOLHDL", "LDLDIRECT" in the last 72 hours. Thyroid Function Tests: Recent Labs    07/02/22 0148  TSH 1.255   Anemia Panel: Recent Labs    07/02/22 0148 07/02/22 0418  VITAMINB12  --  268  FOLATE 9.8  --    Sepsis Labs: Recent Labs  Lab 07/01/22 1550 07/01/22 1916 07/02/22 0418  LATICACIDVEN 4.5* 2.7* 1.4    Recent Results (from the past 240 hour(s))  Culture, blood (Routine x 2)     Status: None (Preliminary result)   Collection Time: 07/01/22  3:43 PM   Specimen: BLOOD RIGHT FOREARM  Result Value Ref Range Status   Specimen Description BLOOD RIGHT FOREARM  Final   Special Requests   Final    BOTTLES DRAWN AEROBIC AND ANAEROBIC Blood Culture results may not be optimal due to an inadequate volume of blood received in culture bottles   Culture   Final    NO GROWTH 3 DAYS Performed at [facility], [place] [address] [place], [place] [address]    Report Status PENDING  Incomplete  Culture, blood (Routine x 2)     Status: None (Preliminary result)   Collection Time: 07/01/22  3:48 PM   Specimen: BLOOD LEFT ARM  Result Value Ref Range Status   Specimen Description BLOOD LEFT ARM  Final   Special Requests   Final    BOTTLES DRAWN AEROBIC AND ANAEROBIC Blood Culture adequate volume   Culture   Final    NO GROWTH 3 DAYS Performed at [facility], [place] [address] [place], [place] [address]    Report Status PENDING  Incomplete  Resp Panel by RT-PCR (Flu A&B, Covid) Anterior Nasal Swab     Status: None   Collection Time: 07/01/22  8:34  PM   Specimen: Anterior Nasal Swab  Result Value Ref Range Status   SARS Coronavirus 2 by RT PCR NEGATIVE NEGATIVE Final    Comment: (NOTE) SARS-CoV-2 target nucleic acids are NOT DETECTED.  The SARS-CoV-2 RNA is generally detectable in upper respiratory specimens during the acute phase of infection. The lowest concentration of SARS-CoV-2 viral copies this assay can detect is 138 copies/mL. A negative result does not preclude SARS-Cov-2 infection and should not be used as the sole  basis for treatment or other patient management decisions. A negative result may occur with  improper specimen collection/handling, submission of specimen other than nasopharyngeal swab, presence of viral mutation(s) within the areas targeted by this assay, and inadequate number of viral copies(<138 copies/mL). A negative result must be combined with clinical observations, patient history, and epidemiological information. The expected result is Negative.  Fact Sheet for Patients:  EntrepreneurPulse.com.au  Fact Sheet for Healthcare Providers:  IncredibleEmployment.be  This test is no t yet approved or cleared by the [place] FDA and  has been authorized for detection and/or diagnosis of SARS-CoV-2 by FDA under an Emergency Use Authorization (EUA). This EUA will remain  in effect (meaning this test can be used) for the duration of the COVID-19 declaration under Section 564(b)(1) of the Act, 21 U.S.C.section 360bbb-3(b)(1), unless the authorization is terminated  or revoked sooner.       Influenza A by PCR NEGATIVE NEGATIVE Final   Influenza B by PCR NEGATIVE NEGATIVE Final    Comment: (NOTE) The Xpert Xpress SARS-CoV-2/FLU/RSV plus assay is intended as an aid in the diagnosis of influenza from Nasopharyngeal swab specimens and should not be used as a sole basis for treatment. Nasal washings and aspirates are unacceptable for Xpert Xpress  SARS-CoV-2/FLU/RSV testing.  Fact Sheet for Patients: EntrepreneurPulse.com.au  Fact Sheet for Healthcare Providers: IncredibleEmployment.be  This test is not yet approved or cleared by the [place] FDA and has been authorized for detection and/or diagnosis of SARS-CoV-2 by FDA under an Emergency Use Authorization (EUA). This EUA will remain in effect (meaning this test can be used) for the duration of the COVID-19 declaration under Section 564(b)(1) of the Act, 21 U.S.C. section 360bbb-3(b)(1), unless the authorization is terminated or revoked.  Performed at [facility], [place] [address], [place], [place] [address]   Surgical pcr screen     Status: None   Collection Time: 07/02/22  1:50 PM   Specimen: Nasal Mucosa; Nasal Swab  Result Value Ref Range Status   MRSA, PCR NEGATIVE NEGATIVE Final   Staphylococcus aureus NEGATIVE NEGATIVE Final    Comment: (NOTE) The Xpert SA Assay (FDA approved for NASAL specimens in patients 46 years of age and older), is one component of a comprehensive surveillance program. It is not intended to diagnose infection nor to guide or monitor treatment. Performed at [facility], [place] [address], [place], [place] [address]     Radiology Studies: No results found.  Scheduled Meds:  aspirin EC  325 mg Oral Q breakfast   brinzolamide  1 drop Both Eyes QHS   docusate sodium  100 mg Oral BID   insulin aspart  0-15 Units Subcutaneous TID AC & HS   latanoprost  1 drop Both Eyes QHS   losartan  100 mg Oral Daily   metoprolol succinate  50 mg Oral Daily   Continuous Infusions:   LOS: 3 days   Raiford Noble, DO [facility] Available via Epic secure chat 7am-7pm After these hours, please refer to coverage provider listed on [email or web address] 07/04/2022, 4:27 PM

## 2022-07-04 NOTE — NC FL2 (Signed)
[place] MEDICAID FL2 LEVEL OF CARE SCREENING TOOL     IDENTIFICATION  Patient Name: Alicia Vang Birthdate: [birth year] Sex: female Admission Date (Current Location): 07/01/2022  [facility] and [place] Number:  Producer, television/film/video and Address:  [facility] [place], [address], [place], [place] [address]      Provider Number: [id number]  Attending Physician Name and Address:  Merlene Laughter, DO  Relative Name and Phone Number:       Current Level of Care: Hospital Recommended Level of Care: Skilled Nursing Facility Prior Approval Number:    Date Approved/Denied:   PASRR Number: [id number]  Discharge Plan: SNF    Current Diagnoses: Patient Active Problem List   Diagnosis Date Noted   Acute metabolic encephalopathy 07/02/2022   Closed displaced fracture of right femoral neck (HCC) 07/01/2022   SIRS (systemic inflammatory response syndrome) (HCC) 07/01/2022   Lactic acidosis 07/01/2022   Traumatic intraventricular hemorrhage (HCC) 07/01/2022   Closed compression fracture of L3 lumbar vertebra, with delayed healing, subsequent encounter 06/01/2021   Parkinson's disease (HCC) 06/01/2021   Essential hypertension 06/01/2021   COVID-19 virus infection 10/21/2020   Bradycardia 10/21/2020   Hypokalemia 10/21/2020   Type 2 diabetes mellitus without complication, without long-term current use of insulin (HCC) 10/21/2020   Primary osteoarthritis of left hip 10/26/2017   Osteoarthritis of left hip 08/17/2017    Orientation RESPIRATION BLADDER Height & Weight     Self  Normal Incontinent Weight: 128 lb (58.1 kg) Height:  5\' 5"  (165.1 cm)  BEHAVIORAL SYMPTOMS/MOOD NEUROLOGICAL BOWEL NUTRITION STATUS      Continent Diet (Regular)  AMBULATORY STATUS COMMUNICATION OF NEEDS Skin   Extensive Assist Verbally Surgical wounds (closed right hip, foam dressing: lift every shift to assess, change PRN)                       Personal Care Assistance  Level of Assistance  Bathing, Feeding, Dressing Bathing Assistance: Maximum assistance Feeding assistance: Limited assistance Dressing Assistance: Maximum assistance     Functional Limitations Info             SPECIAL CARE FACTORS FREQUENCY  PT (By licensed PT), OT (By licensed OT)     PT Frequency: 5x/wk OT Frequency: 5x/wk            Contractures Contractures Info: Not present    Additional Factors Info  Code Status, Allergies, Insulin Sliding Scale Code Status Info: Full Allergies Info: Amlodipine   Insulin Sliding Scale Info: see DC summary       Current Medications (07/04/2022):  This is the current hospital active medication list Current Facility-Administered Medications  Medication Dose Route Frequency Provider Last Rate Last Admin   acetaminophen (TYLENOL) tablet 650 mg  650 mg Oral Q6H PRN 09/03/2022, MD       Or   acetaminophen (TYLENOL) suppository 650 mg  650 mg Rectal Q6H PRN Jones Broom, MD       acetaminophen (TYLENOL) tablet 325-650 mg  325-650 mg Oral Q6H PRN Jones Broom, MD       aspirin EC tablet 325 mg  325 mg Oral Q breakfast Jones Broom, MD   325 mg at 07/04/22 0854   brinzolamide (AZOPT) 1 % ophthalmic suspension 1 drop  1 drop Both Eyes QHS 09/03/22, MD   1 drop at 07/03/22 2116   docusate sodium (COLACE) capsule 100 mg  100 mg Oral BID 2117, MD  100 mg at 07/04/22 1024   fentaNYL (SUBLIMAZE) injection 12.5 mcg  12.5 mcg Intravenous Q2H PRN Jones Broom, MD       Or   fentaNYL (SUBLIMAZE) injection 25 mcg  25 mcg Intravenous Q2H PRN Jones Broom, MD       hydrALAZINE (APRESOLINE) injection 10 mg  10 mg Intravenous Q6H PRN Jones Broom, MD       HYDROcodone-acetaminophen (NORCO) 7.5-325 MG per tablet 1-2 tablet  1-2 tablet Oral Q4H PRN Jones Broom, MD       HYDROcodone-acetaminophen (NORCO/VICODIN) 5-325 MG per tablet 1-2 tablet  1-2 tablet Oral Q4H PRN Jones Broom, MD       insulin  aspart (novoLOG) injection 0-15 Units  0-15 Units Subcutaneous TID AC & HS Jones Broom, MD   2 Units at 07/04/22 1222   latanoprost (XALATAN) 0.005 % ophthalmic solution 1 drop  1 drop Both Eyes QHS Jones Broom, MD   1 drop at 07/03/22 2116   losartan (COZAAR) tablet 100 mg  100 mg Oral Daily Jones Broom, MD   100 mg at 07/04/22 1024   menthol-cetylpyridinium (CEPACOL) lozenge 3 mg  1 lozenge Oral PRN Jones Broom, MD       Or   phenol (CHLORASEPTIC) mouth spray 1 spray  1 spray Mouth/Throat PRN Jones Broom, MD       metoCLOPramide (REGLAN) tablet 5-10 mg  5-10 mg Oral Q8H PRN Jones Broom, MD       Or   metoCLOPramide (REGLAN) injection 5-10 mg  5-10 mg Intravenous Q8H PRN Jones Broom, MD       metoprolol succinate (TOPROL-XL) 24 hr tablet 50 mg  50 mg Oral Daily Jones Broom, MD   50 mg at 07/04/22 1024   morphine (PF) 2 MG/ML injection 0.5-1 mg  0.5-1 mg Intravenous Q2H PRN Jones Broom, MD       ondansetron [facility]) tablet 4 mg  4 mg Oral Q6H PRN Jones Broom, MD       Or   ondansetron [facility]) injection 4 mg  4 mg Intravenous Q6H PRN Jones Broom, MD       polyethylene glycol (MIRALAX / GLYCOLAX) packet 17 g  17 g Oral Daily PRN Jones Broom, MD         Discharge Medications: Please see discharge summary for a list of discharge medications.  Relevant Imaging Results:  Relevant Lab Results:   Additional Information SS#: [id number]  Baldemar Lenis, LCSW

## 2022-07-04 NOTE — Plan of Care (Signed)
  Problem: Health Behavior/Discharge Planning: Goal: Ability to manage health-related needs will improve Outcome: Not Progressing   Problem: Clinical Measurements: Goal: Will remain free from infection Outcome: Progressing Goal: Diagnostic test results will improve Outcome: Progressing   Problem: Elimination: Goal: Will not experience complications related to urinary retention Outcome: Progressing   Problem: Pain Managment: Goal: General experience of comfort will improve Outcome: Progressing

## 2022-07-04 NOTE — Progress Notes (Signed)
   PATIENT ID: Colena S Mcandrew   2 Days Post-Op Procedure(s) (LRB): PERCUTANEOUS SCREW FIXATION OF HIP (Right)  Subjective: No c/o.  No pain at this time. Up in chair.  Objective:  Vitals:   07/04/22 0905 07/04/22 1548  BP: (!) 155/61 (!) 172/76  Pulse: 61 (!) 56  Resp: 18 18  Temp: 98.1 F (36.7 C) 100 F (37.8 C)  SpO2: 98%     Seated comfortably Hip dressing c/d/I NVID  Labs:  Recent Labs    07/02/22 0159 07/02/22 0418 07/03/22 0913 07/04/22 0903  HGB 12.6 11.4* 11.6* 11.1*   Recent Labs    07/03/22 0913 07/04/22 0903  WBC 5.2 4.5  RBC 3.98 3.80*  HCT 35.7* 33.8*  PLT 146* 145*   Recent Labs    07/03/22 0913 07/04/22 0903  NA 136 136  K 4.3 4.1  CL 104 103  CO2 25 24  BUN 19 20  CREATININE 1.14* 0.85  GLUCOSE 164* 191*  CALCIUM 8.9 8.8*    Assessment and Plan:s/p CRPP R fem neck fx Cont WBAT with PT Dispo SNF when ready F/u with me 10-14 days   VTE proph: ASA, SCDs

## 2022-07-04 NOTE — Plan of Care (Signed)
  Problem: Clinical Measurements: Goal: Will remain free from infection Outcome: Not Progressing   Problem: Activity: Goal: Risk for activity intolerance will decrease Outcome: Not Progressing   Problem: Pain Managment: Goal: General experience of comfort will improve Outcome: Not Progressing   

## 2022-07-04 NOTE — Progress Notes (Signed)
Physical Therapy Treatment Patient Details Name: Alicia Vang MRN: [id number] DOB: 12/03/1926 Today's Date: 07/04/2022   History of Present Illness 88 year old female who lives with her daughter.  Normally uses a rollator and is able to walk up and down the stairs every day.  She fell in her living room and had pain and inability to weight-bear.  She had x-rays in the ER which revealed a nondisplaced impacted right femoral neck fracture, traumatic intraventricular hemhorrage (managing conservatively), delayed healing L3 compression fx.; S/p surgical fixation of femur fracture, WBAT;  has a past medical history of Arthritis, Diabetes mellitus without complication (HCC), Hypertension, and Hypokalemia.    PT Comments    Continuing work on functional mobility and activity tolerance;  Pt with less pain R hip at rest, and seemed to be in better spirits; Able to stand to RW with 2 person heavy mod/Max assist and take pivot steps to the recliner; Needing up to Max assist for pivot steps bed to recliner   Recommendations for follow up therapy are one component of a multi-disciplinary discharge planning process, led by the attending physician.  Recommendations may be updated based on patient status, additional functional criteria and insurance authorization.  Follow Up Recommendations  Skilled nursing-short term rehab (<3 hours/day) Can patient physically be transported by private vehicle: No (but perhaps soon)   Assistance Recommended at Discharge Frequent or constant Supervision/Assistance  Patient can return home with the following Two people to help with walking and/or transfers;Two people to help with bathing/dressing/bathroom;Help with stairs or ramp for entrance   Equipment Recommendations  Wheelchair (measurements PT);Wheelchair cushion (measurements PT);Hospital bed;Other (comment)    Recommendations for Other Services       Precautions / Restrictions Precautions Precautions:  Fall Restrictions RLE Weight Bearing: Weight bearing as tolerated     Mobility  Bed Mobility Overal bed mobility: Needs Assistance Bed Mobility: Supine to Sit     Supine to sit: Max assist, +2 for physical assistance, +2 for safety/equipment     General bed mobility comments: Decr tolerance of RLE movement, so opted to cradle hips with bedpad, supporting RLE as pt was turned and assisted into sit    Transfers Overall transfer level: Needs assistance Equipment used: Rolling walker (2 wheels) Transfers: Sit to/from Stand, Bed to chair/wheelchair/BSC Sit to Stand: Max assist, +2 physical assistance, +2 safety/equipment   Step pivot transfers: +2 physical assistance, Mod assist, Max assist       General transfer comment: Good initiation with forward weight shift; Pianful L hip, an dpt needed Max assist o f 2 to come to standing, not quite able to get to fully upright standing; Mod assist with sporadic need for Max assist for suport as she tok very small steps to recliner in her L    Ambulation/Gait                   Stairs             Wheelchair Mobility    Modified Rankin (Stroke Patients Only)       Balance     Sitting balance-[name] Scale: Poor (approaching Fair)       Standing balance-[name] Scale: Zero                              Cognition Arousal/Alertness: Awake/alert Behavior During Therapy: WFL for tasks assessed/performed Overall Cognitive Status: Difficult to assess  Exercises      General Comments General comments (skin integrity, edema, etc.): More able to communicate today, and pt stating less pain      Pertinent Vitals/Pain Pain Assessment Pain Assessment: Faces Faces Pain Scale: Hurts little more Pain Location: R hip Pain Descriptors / Indicators: Grimacing Pain Intervention(s): Monitored during session    Home Living                           Prior Function            PT Goals (current goals can now be found in the care plan section) Acute Rehab PT Goals Patient Stated Goal: Agreeable to OOB PT Goal Formulation: With patient/family Time For Goal Achievement: 07/17/22 Potential to Achieve Goals: Good Progress towards PT goals: Progressing toward goals    Frequency    Min 3X/week      PT Plan Current plan remains appropriate    Co-evaluation              AM-PAC PT "6 Clicks" Mobility   Outcome Measure  Help needed turning from your back to your side while in a flat bed without using bedrails?: A Lot Help needed moving from lying on your back to sitting on the side of a flat bed without using bedrails?: Total Help needed moving to and from a bed to a chair (including a wheelchair)?: Total Help needed standing up from a chair using your arms (e.g., wheelchair or bedside chair)?: Total Help needed to walk in hospital room?: Total Help needed climbing 3-5 steps with a railing? : Total 6 Click Score: 7    End of Session Equipment Utilized During Treatment: Gait belt (bed pads) Activity Tolerance: Patient tolerated treatment well Patient left: in chair;with call bell/phone within reach;with chair alarm set Nurse Communication: Mobility status PT Visit Diagnosis: Other abnormalities of gait and mobility (R26.89);Muscle weakness (generalized) (M62.81);History of falling (Z91.81);Pain Pain - Right/Left: Right Pain - part of body: Hip     Time: 1207-1230 PT Time Calculation (min) (ACUTE ONLY): 23 min  Charges:  $Therapeutic Activity: 23-37 mins                     Van Clines, PT  Acute Rehabilitation Services Office [phone]    Levi Aland 07/04/2022, 2:55 PM

## 2022-07-04 NOTE — Care Management Important Message (Signed)
Important Message  Patient Details  Name: Alicia Vang MRN: [id number] Date of Birth: January 05, 1927   Medicare Important Message Given:  Yes     Sherilyn Banker 07/04/2022, 3:41 PM

## 2022-07-04 NOTE — TOC Initial Note (Signed)
Transition of Care [facility]) - Initial/Assessment Note    Patient Details  Name: Alicia Vang MRN: [id number] Date of Birth: 02/19/1927  Transition of Care [facility]) CM/SW Contact:    Baldemar Lenis, LCSW Phone Number: 07/04/2022, 3:59 PM  Clinical Narrative:            CSW contacted patient's daughter, [name], to discuss recommendation for SNF placement. Daughter in agreement, preference for [facility] or [facility], as patient has been to both before; [facility] first choice as it's closer. CSW completed referral and faxed to SNF for review, will follow.       Expected Discharge Plan: Skilled Nursing Facility Barriers to Discharge: Continued Medical Work up, English as a second language teacher   Patient Goals and CMS Choice Patient states their goals for this hospitalization and ongoing recovery are:: patient unable to participate in goal setting, not fully oriented CMS Medicare.gov Compare Post Acute Care list provided to:: Patient Represenative (must comment) Choice offered to / list presented to : Adult Children  Expected Discharge Plan and Services Expected Discharge Plan: Skilled Nursing Facility     Post Acute Care Choice: Skilled Nursing Facility Living arrangements for the past 2 months: Single Family Home                                      Prior Living Arrangements/Services Living arrangements for the past 2 months: Single Family Home Lives with:: Adult Children Patient language and need for interpreter reviewed:: No Do you feel safe going back to the place where you live?: Yes      Need for Family Participation in Patient Care: Yes (Comment) Care giver support system in place?: No (comment)   Criminal Activity/Legal Involvement Pertinent to Current Situation/Hospitalization: No - Comment as needed  Activities of Daily Living      Permission Sought/Granted Permission sought to share information with : Facility Medical sales representative, Family  Supports Permission granted to share information with : Yes, Verbal Permission Granted  Share Information with NAME: [name]  Permission granted to share info w AGENCY: SNF  Permission granted to share info w Relationship: Daughter     Emotional Assessment   Attitude/Demeanor/Rapport: Unable to Assess Affect (typically observed): Unable to Assess Orientation: : Oriented to Self Alcohol / Substance Use: Not Applicable Psych Involvement: No (comment)  Admission diagnosis:  Closed fracture of head of right femur, initial encounter (HCC) [S72.051A] Closed displaced fracture of right femoral neck (HCC) [S72.001A] Patient Active Problem List   Diagnosis Date Noted   Acute metabolic encephalopathy 07/02/2022   Closed displaced fracture of right femoral neck (HCC) 07/01/2022   SIRS (systemic inflammatory response syndrome) (HCC) 07/01/2022   Lactic acidosis 07/01/2022   Traumatic intraventricular hemorrhage (HCC) 07/01/2022   Closed compression fracture of L3 lumbar vertebra, with delayed healing, subsequent encounter 06/01/2021   Parkinson's disease (HCC) 06/01/2021    Class: Question of   Essential hypertension 06/01/2021   COVID-19 virus infection 10/21/2020   Bradycardia 10/21/2020   Hypokalemia 10/21/2020   Type 2 diabetes mellitus without complication, without long-term current use of insulin (HCC) 10/21/2020   Primary osteoarthritis of left hip 10/26/2017   Osteoarthritis of left hip 08/17/2017   PCP:  Knox Royalty, MD Pharmacy:   [id number] [place], [place] - [address] [place] [address] Phone: [phone] Fax: [phone]     Social Determinants of Health (SDOH) Interventions  Readmission Risk Interventions     No data to display

## 2022-07-05 DIAGNOSIS — N1831 Chronic kidney disease, stage 3a: Secondary | ICD-10-CM | POA: Diagnosis not present

## 2022-07-05 DIAGNOSIS — E8809 Other disorders of plasma-protein metabolism, not elsewhere classified: Secondary | ICD-10-CM

## 2022-07-05 DIAGNOSIS — D696 Thrombocytopenia, unspecified: Secondary | ICD-10-CM

## 2022-07-05 DIAGNOSIS — S32030G Wedge compression fracture of third lumbar vertebra, subsequent encounter for fracture with delayed healing: Secondary | ICD-10-CM | POA: Diagnosis not present

## 2022-07-05 DIAGNOSIS — S72001A Fracture of unspecified part of neck of right femur, initial encounter for closed fracture: Secondary | ICD-10-CM | POA: Diagnosis not present

## 2022-07-05 DIAGNOSIS — G9341 Metabolic encephalopathy: Secondary | ICD-10-CM | POA: Diagnosis not present

## 2022-07-05 LAB — COMPREHENSIVE METABOLIC PANEL
ALT: 12 U/L (ref 0–44)
AST: 23 U/L (ref 15–41)
Albumin: 3.4 g/dL — ABNORMAL LOW (ref 3.5–5.0)
Alkaline Phosphatase: 38 U/L (ref 38–126)
Anion gap: 13 (ref 5–15)
BUN: 23 mg/dL (ref 8–23)
CO2: 23 mmol/L (ref 22–32)
Calcium: 8.9 mg/dL (ref 8.9–10.3)
Chloride: 101 mmol/L (ref 98–111)
Creatinine, Ser: 0.88 mg/dL (ref 0.44–1.00)
GFR, Estimated: >60 mL/min (ref >60)
Glucose, Bld: 148 mg/dL — ABNORMAL HIGH (ref 70–99)
Potassium: 3.7 mmol/L (ref 3.5–5.1)
Sodium: 137 mmol/L (ref 135–145)
Total Bilirubin: 1 mg/dL (ref 0.3–1.2)
Total Protein: 6.9 g/dL (ref 6.5–8.1)

## 2022-07-05 LAB — CBC WITH DIFFERENTIAL/PLATELET
Abs Immature Granulocytes: 0.02 10*3/uL (ref 0.00–0.07)
Basophils Absolute: 0 10*3/uL (ref 0.0–0.1)
Basophils Relative: 0 %
Eosinophils Absolute: 0.2 10*3/uL (ref 0.0–0.5)
Eosinophils Relative: 3 %
HCT: 34 % — ABNORMAL LOW (ref 36.0–46.0)
Hemoglobin: 11.2 g/dL — ABNORMAL LOW (ref 12.0–15.0)
Immature Granulocytes: 0 %
Lymphocytes Relative: 14 %
Lymphs Abs: 0.6 10*3/uL — ABNORMAL LOW (ref 0.7–4.0)
MCH: 28.9 pg (ref 26.0–34.0)
MCHC: 32.9 g/dL (ref 30.0–36.0)
MCV: 87.6 fL (ref 80.0–100.0)
Monocytes Absolute: 0.4 10*3/uL (ref 0.1–1.0)
Monocytes Relative: 8 %
Neutro Abs: 3.4 10*3/uL (ref 1.7–7.7)
Neutrophils Relative %: 75 %
Platelets: 167 10*3/uL (ref 150–400)
RBC: 3.88 MIL/uL (ref 3.87–5.11)
RDW: 14.4 % (ref 11.5–15.5)
WBC: 4.5 10*3/uL (ref 4.0–10.5)
nRBC: 0 % (ref 0.0–0.2)

## 2022-07-05 LAB — PHOSPHORUS: Phosphorus: 4 mg/dL (ref 2.5–4.6)

## 2022-07-05 LAB — GLUCOSE, CAPILLARY
Glucose-Capillary: 226 mg/dL — ABNORMAL HIGH (ref 70–99)
Glucose-Capillary: 233 mg/dL — ABNORMAL HIGH (ref 70–99)
Glucose-Capillary: 308 mg/dL — ABNORMAL HIGH (ref 70–99)
Glucose-Capillary: 257 mg/dL — ABNORMAL HIGH (ref 70–99)
Glucose-Capillary: 204 mg/dL — ABNORMAL HIGH (ref 70–99)

## 2022-07-05 LAB — IRON AND TIBC
Iron: 36 ug/dL (ref 28–170)
Saturation Ratios: 14 % (ref 10.4–31.8)
TIBC: 253 ug/dL (ref 250–450)
UIBC: 217 ug/dL

## 2022-07-05 LAB — FERRITIN: Ferritin: 150 ng/mL (ref 11–307)

## 2022-07-05 LAB — VITAMIN D 25 HYDROXY (VIT D DEFICIENCY, FRACTURES): Vit D, 25-Hydroxy: 60.7 ng/mL (ref 30–100)

## 2022-07-05 LAB — FOLATE: Folate: 12 ng/mL (ref >5.9)

## 2022-07-05 LAB — VITAMIN B12: Vitamin B-12: 289 pg/mL (ref 180–914)

## 2022-07-05 LAB — RETICULOCYTES
Immature Retic Fract: 13.7 % (ref 2.3–15.9)
RBC.: 3.86 MIL/uL — ABNORMAL LOW (ref 3.87–5.11)
Retic Count, Absolute: 62.9 10*3/uL (ref 19.0–186.0)
Retic Ct Pct: 1.6 % (ref 0.4–3.1)

## 2022-07-05 LAB — MAGNESIUM: Magnesium: 2 mg/dL (ref 1.7–2.4)

## 2022-07-05 NOTE — Assessment & Plan Note (Addendum)
Likely related to acute illness. Platelets down to a nadir of 128 and have now recovered.

## 2022-07-05 NOTE — Progress Notes (Signed)
Mobility Specialist Criteria Algorithm Info.   07/05/22 1230  Mobility  Activity Transferred from bed to chair  Range of Motion/Exercises Active;Passive  Level of Assistance Maximum assist, patient does 25-49%  Assistive Device Other (Comment) (HHA)  RLE Weight Bearing WBAT  Activity Response Tolerated well   Patient received in supine asleep needing stimuli to arouse. Verbalized minimally by answering all questions with "yes". Agreeable to participate in mobility. Required max A + max cues to EOB from supine>sit. Stood with max/total A but was resistive to movement by holding on to bed railing, refusing to let go. Daughter at bedside assisted with helping orienting to situation and processing cues and commands. Completed forward-facing squat pivot to recliner. Tolerated without complaint or incident. Was left in recliner with all needs met, call bell in reach.   Alicia Vang, [place], [place]  YVGCY:[phone] Office: [phone]

## 2022-07-05 NOTE — Hospital Course (Signed)
Alicia Vang is a 88 y.o. female with a history of diabetes mellitus type 2, parkinson disease, hypertension, vitamin D deficiency. Patient presented secondary to fall and resultant right hip fracture. Orthopedic surgery consulted and performed successful percutaneous screw fixation of the right hip on 9/10. WBAT with PT/OT. PT/OT recommending SNF for discharge.

## 2022-07-05 NOTE — TOC Progression Note (Signed)
Transition of Care [facility]) - Progression Note    Patient Details  Name: Alicia Vang MRN: [id number] Date of Birth: Jun 30, 1927  Transition of Care [facility]) CM/SW Contact  Baldemar Lenis, [place] Phone Number: 07/05/2022, 12:49 PM  Clinical Narrative:   CSW confirmed with [facility] that they can offer a bed for patient, bed will be available tomorrow. CSW contacted daughter, [name], to provide update, and she is in agreement. CSW initiated and obtained insurance authorization to admit to SNF tomorrow pending medical stability. CSW to follow.    Expected Discharge Plan: Skilled Nursing Facility Barriers to Discharge: Continued Medical Work up, English as a second language teacher  Expected Discharge Plan and Services Expected Discharge Plan: Skilled Nursing Facility     Post Acute Care Choice: Skilled Nursing Facility Living arrangements for the past 2 months: Single Family Home                                       Social Determinants of Health (SDOH) Interventions    Readmission Risk Interventions     No data to display

## 2022-07-05 NOTE — Assessment & Plan Note (Signed)
Stable

## 2022-07-05 NOTE — Progress Notes (Signed)
PROGRESS NOTE    Alicia Vang  KNL:[id number] DOB: July 15, 1927 DOA: 07/01/2022 PCP: Knox Royalty, MD   Brief Narrative: Alicia Vang is a 88 y.o. female with a history of diabetes mellitus type 2, parkinson disease, hypertension, vitamin D deficiency. Patient presented secondary to fall and resultant right hip fracture. Orthopedic surgery consulted and performed successful percutaneous screw fixation of the right hip on 9/10. WBAT with PT/OT. PT/OT recommending SNF for discharge.   Assessment and Plan: * Closed displaced fracture of right femoral neck (HCC) Secondary to fall. Orthopedic surgery consulted and performed percutaneous screw fixation of the right hip on 9/10. Orthopedic surgery recommendations for WBAT with PT/OT. And Aspirin 325 mg/SCDs for VTE prophylaxis. PT/OT recommending SNF on discharge. -Continue Aspirin 325 mg per orthopedic surgery  SIRS (systemic inflammatory response syndrome) (HCC)-resolved as of 07/05/2022 Present on admission. No infectious source identified on admission. Workup negative for identification of infectious source.  Lactic acidosis-resolved as of 07/05/2022 Likely secondary to volume depletion. Lactic acid of 4.5 on admission which improved to 1.4 with IV fluids.  Acute metabolic encephalopathy-resolved as of 07/05/2022 AMS workup performed, including urine toxicology, urinalysis/urine culture, blood cultures, RPR, ammonia, CRP, Vitamin B12, folate, TSH and CT imaging of the head, without etiology. Encephalopathy now resolved.  Closed compression fracture of L3 lumbar vertebra, with delayed healing, subsequent encounter Previously diagnosed. Asymptomatic. X-ray imaging suggests nonhealing and somewhat progressing L3 fracture.   Traumatic intraventricular hemorrhage (HCC) Noted on CT imaging. Neurosurgery, Dr. Conchita Paris consulted and recommended nonoperative management and recommendation for no repeat imaging.  Type 2 diabetes  mellitus without complication, without long-term current use of insulin (HCC) -Continue SSI  Essential hypertension -Continue losartan and Toprol XL  Hypokalemia-resolved as of 07/05/2022 Treated with supplemental potassium. Resolved.  Hypoalbuminemia Mild.  Chronic kidney disease, stage 3a (HCC) Stable.  Thrombocytopenia (HCC)-resolved as of 07/05/2022 Likely related to acute illness. Platelets down to a trough of 128 and have now recovered.    DVT prophylaxis: Aspirin/SCDs Code Status:   Code Status: Full Code Family Communication: Daughter at bedside Disposition Plan: Discharge to SNF when bed is available. Medically stable for discharge   Consultants:  Orthopedic surgery  Procedures:  Percutaneous screw fixation (9/10)  Antimicrobials: None    Subjective: Patient reports no concerns today. No pain.  Objective: BP (!) 176/68 (BP Location: Left Arm)   Pulse (!) 52   Temp 99 F (37.2 C) (Oral)   Resp 17   Ht 5\' 5"  (1.651 m)   Wt 58.1 kg   SpO2 100%   BMI 21.30 kg/m   Examination:  General exam: Appears calm and comfortable Respiratory system: Clear to auscultation. Respiratory effort normal. Cardiovascular system: S1 & S2 heard, RRR. Gastrointestinal system: Abdomen is nondistended, soft and nontender. Normal bowel sounds heard. Central nervous system: Alert and oriented. No focal neurological deficits. Musculoskeletal: No edema. No calf tenderness Skin: No cyanosis. No rashes. No ecchymosis over incision site. Psychiatry: Judgement and insight appear normal. Mood & affect appropriate.    Data Reviewed: I have personally reviewed following labs and imaging studies  CBC Lab Results  Component Value Date   WBC 4.5 07/05/2022   RBC 3.86 (L) 07/05/2022   RBC 3.88 07/05/2022   HGB 11.2 (L) 07/05/2022   HCT 34.0 (L) 07/05/2022   MCV 87.6 07/05/2022   MCH 28.9 07/05/2022   PLT 167 07/05/2022   MCHC 32.9 07/05/2022   RDW 14.4 07/05/2022   LYMPHSABS  0.6 (L) 07/05/2022  MONOABS 0.4 07/05/2022   EOSABS 0.2 07/05/2022   BASOSABS 0.0 07/05/2022     Last metabolic panel Lab Results  Component Value Date   NA 137 07/05/2022   K 3.7 07/05/2022   CL 101 07/05/2022   CO2 23 07/05/2022   BUN 23 07/05/2022   CREATININE 0.88 07/05/2022   GLUCOSE 148 (H) 07/05/2022   GFRNONAA >60 07/05/2022   GFRAA 59 (L) 10/28/2017   CALCIUM 8.9 07/05/2022   PHOS 4.0 07/05/2022   PROT 6.9 07/05/2022   ALBUMIN 3.4 (L) 07/05/2022   BILITOT 1.0 07/05/2022   ALKPHOS 38 07/05/2022   AST 23 07/05/2022   ALT 12 07/05/2022   ANIONGAP 13 07/05/2022    GFR: Estimated Creatinine Clearance: 36.7 mL/min (by C-G formula based on SCr of 0.88 mg/dL).  Recent Results (from the past 240 hour(s))  Culture, blood (Routine x 2)     Status: None (Preliminary result)   Collection Time: 07/01/22  3:43 PM   Specimen: BLOOD RIGHT FOREARM  Result Value Ref Range Status   Specimen Description BLOOD RIGHT FOREARM  Final   Special Requests   Final    BOTTLES DRAWN AEROBIC AND ANAEROBIC Blood Culture results may not be optimal due to an inadequate volume of blood received in culture bottles   Culture   Final    NO GROWTH 4 DAYS Performed at [facility], [address] [place], [place] [address]    Report Status PENDING  Incomplete  Culture, blood (Routine x 2)     Status: None (Preliminary result)   Collection Time: 07/01/22  3:48 PM   Specimen: BLOOD LEFT ARM  Result Value Ref Range Status   Specimen Description BLOOD LEFT ARM  Final   Special Requests   Final    BOTTLES DRAWN AEROBIC AND ANAEROBIC Blood Culture adequate volume   Culture   Final    NO GROWTH 4 DAYS Performed at [facility], [address] [place], [place] [address]    Report Status PENDING  Incomplete  Resp Panel by RT-PCR (Flu A&B, Covid) Anterior Nasal Swab     Status: None   Collection Time: 07/01/22  8:34 PM   Specimen: Anterior Nasal Swab  Result Value Ref Range Status    SARS Coronavirus 2 by RT PCR NEGATIVE NEGATIVE Final    Comment: (NOTE) SARS-CoV-2 target nucleic acids are NOT DETECTED.  The SARS-CoV-2 RNA is generally detectable in upper respiratory specimens during the acute phase of infection. The lowest concentration of SARS-CoV-2 viral copies this assay can detect is 138 copies/mL. A negative result does not preclude SARS-Cov-2 infection and should not be used as the sole basis for treatment or other patient management decisions. A negative result may occur with  improper specimen collection/handling, submission of specimen other than nasopharyngeal swab, presence of viral mutation(s) within the areas targeted by this assay, and inadequate number of viral copies(<138 copies/mL). A negative result must be combined with clinical observations, patient history, and epidemiological information. The expected result is Negative.  Fact Sheet for Patients:  BloggerCourse.com  Fact Sheet for Healthcare Providers:  SeriousBroker.it  This test is no t yet approved or cleared by the [place] FDA and  has been authorized for detection and/or diagnosis of SARS-CoV-2 by FDA under an Emergency Use Authorization (EUA). This EUA will remain  in effect (meaning this test can be used) for the duration of the COVID-19 declaration under Section 564(b)(1) of the Act, 21 U.S.C.section 360bbb-3(b)(1), unless  the authorization is terminated  or revoked sooner.       Influenza A by PCR NEGATIVE NEGATIVE Final   Influenza B by PCR NEGATIVE NEGATIVE Final    Comment: (NOTE) The Xpert Xpress SARS-CoV-2/FLU/RSV plus assay is intended as an aid in the diagnosis of influenza from Nasopharyngeal swab specimens and should not be used as a sole basis for treatment. Nasal washings and aspirates are unacceptable for Xpert Xpress SARS-CoV-2/FLU/RSV testing.  Fact Sheet for  Patients: BloggerCourse.com  Fact Sheet for Healthcare Providers: SeriousBroker.it  This test is not yet approved or cleared by the [place] FDA and has been authorized for detection and/or diagnosis of SARS-CoV-2 by FDA under an Emergency Use Authorization (EUA). This EUA will remain in effect (meaning this test can be used) for the duration of the COVID-19 declaration under Section 564(b)(1) of the Act, 21 U.S.C. section 360bbb-3(b)(1), unless the authorization is terminated or revoked.  Performed at [facility], [address], [place], [place] [address]   Surgical pcr screen     Status: None   Collection Time: 07/02/22  1:50 PM   Specimen: Nasal Mucosa; Nasal Swab  Result Value Ref Range Status   MRSA, PCR NEGATIVE NEGATIVE Final   Staphylococcus aureus NEGATIVE NEGATIVE Final    Comment: (NOTE) The Xpert SA Assay (FDA approved for NASAL specimens in patients 67 years of age and older), is one component of a comprehensive surveillance program. It is not intended to diagnose infection nor to guide or monitor treatment. Performed at [facility], [address] [place], [place] [address]       Radiology Studies: No results found.    LOS: 4 days    Jacquelin Hawking, MD [facility] 07/05/2022, 10:56 AM   If 7PM-7AM, please contact night-coverage [email or web address]

## 2022-07-05 NOTE — Assessment & Plan Note (Signed)
Mild.

## 2022-07-06 DIAGNOSIS — S72001A Fracture of unspecified part of neck of right femur, initial encounter for closed fracture: Secondary | ICD-10-CM | POA: Diagnosis not present

## 2022-07-06 LAB — GLUCOSE, CAPILLARY
Glucose-Capillary: 118 mg/dL — ABNORMAL HIGH (ref 70–99)
Glucose-Capillary: 191 mg/dL — ABNORMAL HIGH (ref 70–99)

## 2022-07-06 LAB — CULTURE, BLOOD (ROUTINE X 2)
Culture: NO GROWTH
Culture: NO GROWTH
Special Requests: ADEQUATE

## 2022-07-06 MED ORDER — ASPIRIN 325 MG PO TBEC: 325.0000 mg | DELAYED_RELEASE_TABLET | Freq: Every day | ORAL | Status: AC

## 2022-07-06 MED ORDER — POLYETHYLENE GLYCOL 3350 17 G PO PACK: 17.0000 g | PACK | Freq: Every day | ORAL | Status: DC | PRN

## 2022-07-06 MED ORDER — HYDROCODONE-ACETAMINOPHEN 5-325 MG PO TABS: 1.0000 | ORAL_TABLET | Freq: Four times a day (QID) | ORAL | 0 refills | Status: DC | PRN

## 2022-07-06 MED ORDER — DOCUSATE SODIUM 100 MG PO CAPS: 100.0000 mg | ORAL_CAPSULE | Freq: Two times a day (BID) | ORAL | Status: DC

## 2022-07-06 NOTE — Discharge Summary (Signed)
Physician Discharge Summary   Patient: Alicia Vang MRN: [id number] DOB: [birth year]  Admit date:     07/01/2022  Discharge date: 07/06/22  Discharge Physician: Jacquelin Hawking, MD   PCP: Knox Royalty, MD   Recommendations at discharge:  PCP and orthopedic surgery follow-up  Discharge Diagnoses: Principal Problem:   Closed displaced fracture of right femoral neck (HCC) Active Problems:   Closed compression fracture of L3 lumbar vertebra, with delayed healing, subsequent encounter   Traumatic intraventricular hemorrhage (HCC)   Type 2 diabetes mellitus without complication, without long-term current use of insulin (HCC)   Essential hypertension   Chronic kidney disease, stage 3a (HCC)   Hypoalbuminemia  Resolved Problems:   SIRS (systemic inflammatory response syndrome) (HCC)   Lactic acidosis   Acute metabolic encephalopathy   Hypokalemia   Thrombocytopenia [facility])  Hospital Course: Alicia Vang is a 88 y.o. female with a history of diabetes mellitus type 2, parkinson disease, hypertension, vitamin D deficiency. Patient presented secondary to fall and resultant right hip fracture. Orthopedic surgery consulted and performed successful percutaneous screw fixation of the right hip on 9/10. WBAT with PT/OT. PT/OT recommending SNF for discharge.  Assessment and Plan: * Closed displaced fracture of right femoral neck (HCC) Secondary to fall. Orthopedic surgery consulted and performed percutaneous screw fixation of the right hip on 9/10. Orthopedic surgery recommendations for WBAT with PT/OT. And Aspirin 325 mg/SCDs for VTE prophylaxis. PT/OT recommending SNF on discharge.  SIRS (systemic inflammatory response syndrome) (HCC)-resolved as of 07/05/2022 Present on admission. No infectious source identified on admission. Workup negative for identification of infectious source.  Lactic acidosis-resolved as of 07/05/2022 Likely secondary to volume depletion. Lactic acid of 4.5 on  admission which improved to 1.4 with IV fluids.  Acute metabolic encephalopathy-resolved as of 07/05/2022 AMS workup performed, including urine toxicology, urinalysis/urine culture, blood cultures, RPR, ammonia, CRP, Vitamin B12, folate, TSH and CT imaging of the head, without etiology. Encephalopathy now resolved.  Closed compression fracture of L3 lumbar vertebra, with delayed healing, subsequent encounter Previously diagnosed. Asymptomatic. X-ray imaging suggests nonhealing and somewhat progressing L3 fracture. Conservative management.  Traumatic intraventricular hemorrhage (HCC) Noted on CT imaging. Neurosurgery, Dr. Conchita Paris consulted and recommended nonoperative management and recommendation for no repeat imaging.  Type 2 diabetes mellitus without complication, without long-term current use of insulin (HCC) Patient is on glimepiride as an outpatient. Recommendation to discontinue on discharge.  Essential hypertension Continue losartan and Toprol XL. Discontinue chlorthalidone on discharge.  Hypokalemia-resolved as of 07/05/2022 Treated with supplemental potassium. Resolved.  Hypoalbuminemia Mild.  Chronic kidney disease, stage 3a (HCC) Stable.  Thrombocytopenia (HCC)-resolved as of 07/05/2022 Likely related to acute illness. Platelets down to a nadir of 128 and have now recovered.   Consultants: Orthopedic surgery Procedures performed: Percutaneous screw fixation (9/10)  Disposition: Skilled nursing facility Diet recommendation: Regular diet  DISCHARGE MEDICATION: Allergies as of 07/06/2022       Reactions   Amlodipine Swelling   Swelling in feet        Medication List     STOP taking these medications    chlorthalidone 25 MG tablet Commonly known as: HYGROTON   glimepiride 2 MG tablet Commonly known as: AMARYL       TAKE these medications    acetaminophen 325 MG tablet Commonly known as: TYLENOL Take 2 tablets (650 mg total) by mouth every 6 (six)  hours as needed for mild pain or headache (fever >/= 101).   aspirin EC 325 MG tablet Take 1 tablet (  325 mg total) by mouth daily with breakfast for 21 days. Start taking on: July 07, 2022   brinzolamide 1 % ophthalmic suspension Commonly known as: AZOPT Place 1 drop into both eyes at bedtime.   diclofenac Sodium 1 % Gel Commonly known as: VOLTAREN Apply 2 g topically 4 (four) times daily as needed (lower back pain as needed).   docusate sodium 100 MG capsule Commonly known as: COLACE Take 1 capsule (100 mg total) by mouth 2 (two) times daily.   HYDROcodone-acetaminophen 5-325 MG tablet Commonly known as: NORCO/VICODIN Take 1 tablet by mouth every 6 (six) hours as needed for severe pain.   latanoprost 0.005 % ophthalmic solution Commonly known as: XALATAN Place 1 drop into both eyes at bedtime.   losartan 100 MG tablet Commonly known as: COZAAR Take 100 mg by mouth daily.   Metoprolol Succinate 50 MG Cs24 Take 50 mg by mouth daily.   polyethylene glycol 17 g packet Commonly known as: MIRALAX / GLYCOLAX Take 17 g by mouth daily as needed for mild constipation. What changed:  when to take this reasons to take this   Potassium Chloride ER 20 MEQ Tbcr Take 20 mEq by mouth daily.   Vitamin D (Ergocalciferol) 1.25 MG (50000 UNIT) Caps capsule Commonly known as: DRISDOL Take 50,000 Units by mouth once a week.   Xiidra 5 % Soln Generic drug: Lifitegrast Place 1 drop into both eyes at bedtime.               Discharge Care Instructions  (From admission, onward)           Start     Ordered   07/06/22 0000  Discharge wound care:       Comments: Reinforce dressing   07/06/22 1148            Discharge Exam: BP (!) 150/53 (BP Location: Left Arm)   Pulse (!) 54   Temp 98.4 F (36.9 C) (Oral)   Resp 17   Ht 5\' 5"  (1.651 m)   Wt 58.1 kg   SpO2 100%   BMI 21.30 kg/m   General exam: Appears calm and comfortable Respiratory system: Clear to  auscultation. Respiratory effort normal. Cardiovascular system: S1 & S2 heard, RRR. Gastrointestinal system: Abdomen is nondistended, soft and nontender. No organomegaly or masses felt. Normal bowel sounds heard. Central nervous system: Alert. Follows commands but does not respond verbally. No focal neurological deficits noted. Musculoskeletal: No edema. No calf tenderness  Condition at discharge: stable  The results of significant diagnostics from this hospitalization (including imaging, microbiology, ancillary and laboratory) are listed below for reference.   Imaging Studies: DG HIP UNILAT WITH PELVIS 2-3 VIEWS RIGHT  Result Date: 07/02/2022 CLINICAL DATA:  Fracture right femur, fluoroscopic assistance for internal fixation EXAM: DG HIP (WITH OR WITHOUT PELVIS) 2-3V RIGHT COMPARISON:  07/01/2022 FINDINGS: Fluoroscopic images show internal fixation of subcapital fracture of neck of right femur with 3 surgical screws. Fluoroscopy time 36.3 seconds. Radiation dose 5.13 mGy. IMPRESSION: Fluoroscopic assistance was provided for internal fixation of fracture of neck of right femur. Electronically Signed   By: 08/31/2022 M.D.   On: 07/02/2022 16:28   DG C-Arm 1-60 Min-No Report  Result Date: 07/02/2022 Fluoroscopy was utilized by the requesting physician.  No radiographic interpretation.   CT ABDOMEN PELVIS W CONTRAST  Result Date: 07/01/2022 CLINICAL DATA:  Acute abdominal pain. EXAM: CT ABDOMEN AND PELVIS WITH CONTRAST TECHNIQUE: Multidetector CT imaging of the abdomen and pelvis was  performed using the standard protocol following bolus administration of intravenous contrast. RADIATION DOSE REDUCTION: This exam was performed according to the departmental dose-optimization program which includes automated exposure control, adjustment of the mA and/or kV according to patient size and/or use of iterative reconstruction technique. CONTRAST:  OMNIPAQUE IOHEXOL 300 MG/ML  SOLN COMPARISON:   CT renal stone 03/24/2021.  Lumbar spine CT 05/31/2021. FINDINGS: Lower chest: No acute abnormality. Hepatobiliary: Hepatic cysts are present. The largest measures 3.7 cm. These are similar to the prior study. Gallbladder and bile ducts are within normal limits. Pancreas: Unremarkable. No pancreatic ductal dilatation or surrounding inflammatory changes. Spleen: Normal in size without focal abnormality. Adrenals/Urinary Tract: Adrenal glands are unremarkable. Kidneys are normal, without renal calculi, focal lesion, or hydronephrosis. Bladder is unremarkable. Stomach/Bowel: Stomach is within normal limits. No evidence of bowel wall thickening, distention, or inflammatory changes. There is sigmoid colon diverticulosis. The appendix is not visualized. Vascular/Lymphatic: Aortic atherosclerosis. No enlarged abdominal or pelvic lymph nodes. Reproductive: Status post hysterectomy. No adnexal masses. Other: There is some thinning and bulging of the anterior abdominal wall, unchanged. There is no ascites. Musculoskeletal: The bones are diffusely osteopenic. There is moderate compression fracture of L3 which has progressed compared to the prior study. There is minimal retropulsion of fracture fragments along the superior endplate without significant central canal stenosis. Mild chronic compression deformity of T12 is unchanged. Left hip arthroplasty is present. IMPRESSION: 1. Progression of L3 compression deformity. Please correlate clinically for acuity. 2. No other acute localizing process in the abdomen or pelvis. 3. Colonic diverticulosis. 4. Hepatic cysts. 5.  Aortic Atherosclerosis (ICD10-I70.0). Electronically Signed   By: Darliss Cheney M.D.   On: 07/01/2022 21:57   CT Cervical Spine Wo Contrast  Result Date: 07/01/2022 CLINICAL DATA:  Fall. EXAM: CT CERVICAL SPINE WITHOUT CONTRAST TECHNIQUE: Multidetector CT imaging of the cervical spine was performed without intravenous contrast. Multiplanar CT image  reconstructions were also generated. RADIATION DOSE REDUCTION: This exam was performed according to the departmental dose-optimization program which includes automated exposure control, adjustment of the mA and/or kV according to patient size and/or use of iterative reconstruction technique. COMPARISON:  CT of the cervical spine 05/31/2021 FINDINGS: Alignment: No significant listhesis is present. Mild straightening of the normal cervical lordosis is stable. Skull base and vertebrae: Craniocervical junction is normal. Vertebral body heights are normal. No acute or healing fracture is present. Soft tissues and spinal canal: No prevertebral fluid or swelling. No visible canal hematoma. Disc levels:  Stable degenerative changes noted. Upper chest: Scarring is present both lung scratched at scarring at both lung apices is clear. No acute disease is present. IMPRESSION: 1. No acute fracture or traumatic subluxation. 2. Stable degenerative changes of the cervical spine. These results were called by telephone at the time of interpretation on 07/01/2022 at 6:47 pm to provider DR Rush Landmark, who verbally acknowledged these results. Electronically Signed   By: Marin Roberts M.D.   On: 07/01/2022 18:47   CT HEAD WO CONTRAST ( )  Result Date: 07/01/2022 CLINICAL DATA:  Fall today. Mental status changes. EXAM: CT HEAD WITHOUT CONTRAST TECHNIQUE: Contiguous axial images were obtained from the base of the skull through the vertex without intravenous contrast. RADIATION DOSE REDUCTION: This exam was performed according to the departmental dose-optimization program which includes automated exposure control, adjustment of the mA and/or kV according to patient size and/or use of iterative reconstruction technique. COMPARISON:  None Available. FINDINGS: Brain: Moderate generalized atrophy and white matter disease again seen. A  small focus of intraventricular hemorrhage is layering posteriorly in the left lateral ventricle.  Ventricle size is stable. No significant extra axial hemorrhage is present. The brainstem and cerebellum are within normal limits. Vascular: No hyperdense vessel or unexpected calcification. Skull: Calvarium is intact. No focal lytic or blastic lesions are present. No significant extracranial soft tissue lesion is present. Sinuses/Orbits: The globes and orbits are within normal limits. The paranasal sinuses and mastoid air cells are clear. IMPRESSION: 1. Small focus of intraventricular hemorrhage layering posteriorly in the left lateral ventricle. 2. Stable atrophy and white matter disease. This likely reflects the sequela of chronic microvascular ischemia. Critical Value/emergent results were called by telephone at the time of interpretation on 07/01/2022 at 6:43 pm to provider Dr. Rush Landmark, who verbally acknowledged these results. Electronically Signed   By: Marin Roberts M.D.   On: 07/01/2022 18:44   DG Hip Unilat  With Pelvis 2-3 Views Right  Result Date: 07/01/2022 CLINICAL DATA:  Acute RIGHT hip pain following fall. Initial encounter. EXAM: DG HIP (WITH OR WITHOUT PELVIS) 2-3V RIGHT COMPARISON:  03/24/2021 abdominopelvic CT FINDINGS: A subcapital RIGHT femoral neck fracture is noted with mild impaction. There is no evidence of dislocation. LEFT total hip arthroplasty changes are again noted. Degenerative changes of the LOWER lumbar spine are present. IMPRESSION: Subcapital RIGHT femoral neck fracture with mild impaction. Electronically Signed   By: Harmon Pier M.D.   On: 07/01/2022 17:07   DG Chest 2 View  Result Date: 07/01/2022 CLINICAL DATA:  Fall, confusion, pain. EXAM: CHEST - 2 VIEW COMPARISON:  Chest x-ray dated 03/24/2021 FINDINGS: Stable cardiomegaly. Coarse lung markings bilaterally. Chronic bronchitic changes centrally. No confluent opacity to suggest a developing pneumonia. No pleural effusion or pneumothorax is seen. Chronic compression fracture deformities within the upper and lower  thoracic spine. No acute-appearing osseous abnormality. IMPRESSION: 1. No acute findings. No evidence of pneumonia or pulmonary edema. 2. Stable cardiomegaly. 3. Chronic bronchitic changes and chronic interstitial lung disease. Electronically Signed   By: Bary Richard M.D.   On: 07/01/2022 17:02    Microbiology: Results for orders placed or performed during the hospital encounter of 07/01/22  Culture, blood (Routine x 2)     Status: None   Collection Time: 07/01/22  3:43 PM   Specimen: BLOOD RIGHT FOREARM  Result Value Ref Range Status   Specimen Description BLOOD RIGHT FOREARM  Final   Special Requests   Final    BOTTLES DRAWN AEROBIC AND ANAEROBIC Blood Culture results may not be optimal due to an inadequate volume of blood received in culture bottles   Culture   Final    NO GROWTH 5 DAYS Performed at [facility], [address] [place], [place] [address]    Report Status 07/06/2022 FINAL  Final  Culture, blood (Routine x 2)     Status: None   Collection Time: 07/01/22  3:48 PM   Specimen: BLOOD LEFT ARM  Result Value Ref Range Status   Specimen Description BLOOD LEFT ARM  Final   Special Requests   Final    BOTTLES DRAWN AEROBIC AND ANAEROBIC Blood Culture adequate volume   Culture   Final    NO GROWTH 5 DAYS Performed at [facility], [address] [place], [place] [address]    Report Status 07/06/2022 FINAL  Final  Resp Panel by RT-PCR (Flu A&B, Covid) Anterior Nasal Swab     Status: None   Collection Time: 07/01/22  8:34 PM  Specimen: Anterior Nasal Swab  Result Value Ref Range Status   SARS Coronavirus 2 by RT PCR NEGATIVE NEGATIVE Final    Comment: (NOTE) SARS-CoV-2 target nucleic acids are NOT DETECTED.  The SARS-CoV-2 RNA is generally detectable in upper respiratory specimens during the acute phase of infection. The lowest concentration of SARS-CoV-2 viral copies this assay can detect is 138 copies/mL. A negative result does not preclude  SARS-Cov-2 infection and should not be used as the sole basis for treatment or other patient management decisions. A negative result may occur with  improper specimen collection/handling, submission of specimen other than nasopharyngeal swab, presence of viral mutation(s) within the areas targeted by this assay, and inadequate number of viral copies(<138 copies/mL). A negative result must be combined with clinical observations, patient history, and epidemiological information. The expected result is Negative.  Fact Sheet for Patients:  BloggerCourse.comhttps://www.fda.gov/media/152166/download  Fact Sheet for Healthcare Providers:  SeriousBroker.ithttps://www.fda.gov/media/152162/download  This test is no t yet approved or cleared by the [place] FDA and  has been authorized for detection and/or diagnosis of SARS-CoV-2 by FDA under an Emergency Use Authorization (EUA). This EUA will remain  in effect (meaning this test can be used) for the duration of the COVID-19 declaration under Section 564(b)(1) of the Act, 21 U.S.C.section 360bbb-3(b)(1), unless the authorization is terminated  or revoked sooner.       Influenza A by PCR NEGATIVE NEGATIVE Final   Influenza B by PCR NEGATIVE NEGATIVE Final    Comment: (NOTE) The Xpert Xpress SARS-CoV-2/FLU/RSV plus assay is intended as an aid in the diagnosis of influenza from Nasopharyngeal swab specimens and should not be used as a sole basis for treatment. Nasal washings and aspirates are unacceptable for Xpert Xpress SARS-CoV-2/FLU/RSV testing.  Fact Sheet for Patients: BloggerCourse.comhttps://www.fda.gov/media/152166/download  Fact Sheet for Healthcare Providers: SeriousBroker.ithttps://www.fda.gov/media/152162/download  This test is not yet approved or cleared by the [place] FDA and has been authorized for detection and/or diagnosis of SARS-CoV-2 by FDA under an Emergency Use Authorization (EUA). This EUA will remain in effect (meaning this test can be used) for the duration of  the COVID-19 declaration under Section 564(b)(1) of the Act, 21 U.S.C. section 360bbb-3(b)(1), unless the authorization is terminated or revoked.  Performed at [facility], [address] [place], [place] [address]   Surgical pcr screen     Status: None   Collection Time: 07/02/22  1:50 PM   Specimen: Nasal Mucosa; Nasal Swab  Result Value Ref Range Status   MRSA, PCR NEGATIVE NEGATIVE Final   Staphylococcus aureus NEGATIVE NEGATIVE Final    Comment: (NOTE) The Xpert SA Assay (FDA approved for NASAL specimens in patients 86 years of age and older), is one component of a comprehensive surveillance program. It is not intended to diagnose infection nor to guide or monitor treatment. Performed at [facility], [address] [place], [place] [address]     Labs: CBC: Recent Labs  Lab 07/01/22 1550 07/02/22 0159 07/02/22 0418 07/03/22 0913 07/04/22 0903 07/05/22 0112  WBC 7.7  --  5.7 5.2 4.5 4.5  NEUTROABS 7.1  --  5.0 4.2 3.2 3.4  HGB 13.2 12.6 11.4* 11.6* 11.1* 11.2*  HCT 40.8 37.0 34.2* 35.7* 33.8* 34.0*  MCV 89.5  --  87.5 89.7 88.9 87.6  PLT 187  --  128* 146* 145* 167   Basic Metabolic Panel: Recent Labs  Lab 07/01/22 1550 07/02/22 0159 07/02/22 0418 07/03/22 0913 07/04/22 0903 07/05/22 0112  NA 135 137  136 136 136 137  K 3.2* 3.1* 2.8* 4.3 4.1 3.7  CL 97*  --  102 104 103 101  CO2 21*  --  25 25 24 23   GLUCOSE 273*  --  153* 164* 191* 148*  BUN 14  --  15 19 20 23   CREATININE 1.04*  --  0.99 1.14* 0.85 0.88  CALCIUM 9.5  --  9.1 8.9 8.8* 8.9  MG  --   --  1.8 2.2 2.0 2.0  PHOS  --   --   --  3.5 3.7 4.0   Liver Function Tests: Recent Labs  Lab 07/01/22 1550 07/02/22 0418 07/03/22 0913 07/04/22 0903 07/05/22 0112  AST 35 25 20 21 23   ALT 25 19 14 14 12   ALKPHOS 51 43 36* 35* 38  BILITOT 1.4* 0.9 0.8 0.9 1.0  PROT 8.0 7.0 6.5 6.4* 6.9  ALBUMIN 4.7 3.7 3.4* 3.2* 3.4*   CBG: Recent Labs  Lab 07/05/22 0938 07/05/22 1318  07/05/22 1548 07/05/22 2025 07/06/22 0813  GLUCAP 233* 308* 257* 204* 118*    Discharge time spent: 35 minutes.  Signed: 07/07/22, MD [facility] 07/06/2022

## 2022-07-06 NOTE — Discharge Instructions (Signed)
Alicia Vang,   You were in the hospital with a right femur fracture. This has been repaired. Recommendation is for you to go to a skilled nursing facility for rehabilitation. Please follow-up with your PCP and the orthopedic surgeon as recommended.

## 2022-07-06 NOTE — Progress Notes (Signed)
[employer] auth received for SNF. Spoke to [place] in [facility] admissions who confirmed she received auth details and they are prepared to admit pt today if medically stable. MD updated.   Dellie Burns, MSW, LCSW [phone] (coverage)

## 2022-07-06 NOTE — Progress Notes (Signed)
PTAR at bedside for transport to SNF. Attempted to call report to [facility], no answer. Left call back name and number with secretary at [facility].  Pt remains awake and alert at baseline. Pericare done and gown changed. Pt's daughter present at bedside. Reviewed AVS and copy given to daughter. Pt transported off unit via gurney with daughter and all belongings present at side.

## 2022-07-06 NOTE — TOC Transition Note (Signed)
Transition of Care [facility]) - CM/SW Discharge Note   Patient Details  Name: Alicia Vang MRN: [id number] Date of Birth: 07-14-1927  Transition of Care [facility]) CM/SW Contact:  Deatra Robinson, [place] Phone Number: 07/06/2022, 1:16 PM   Clinical Narrative:  Pt for dc to [facility] today. Spoke to [place] in admissions who confirmed they are prepared to admit pt to room 1006B. Pt's dtr [name] aware of dc and reports agreeable. RN provided with number for report and PTAR called for transport at 1300. SW signing off at dc.   Dellie Burns, MSW, LCSW [phone] (coverage)      Final next level of care: Skilled Nursing Facility Barriers to Discharge: Continued Medical Work up, English as a second language teacher   Patient Goals and CMS Choice Patient states their goals for this hospitalization and ongoing recovery are:: patient unable to participate in goal setting, not fully oriented CMS Medicare.gov Compare Post Acute Care list provided to:: Patient Represenative (must comment) Choice offered to / list presented to : Adult Children  Discharge Placement              Patient chooses bed at: [facility] Patient to be transferred to facility by: PTAR Name of family member notified: [name]/dtr Patient and family notified of of transfer: 07/06/22  Discharge Plan and Services     Post Acute Care Choice: Skilled Nursing Facility                               Social Determinants of Health (SDOH) Interventions     Readmission Risk Interventions     No data to display

## 2022-07-06 NOTE — Progress Notes (Addendum)
Physical Therapy Treatment Patient Details Name: Alicia Vang MRN: [id number] DOB: Nov 26, 1926 Today's Date: 07/06/2022   History of Present Illness 88 year old female who lives with her daughter.  Normally uses a rollator and is able to walk up and down the stairs every day.  She fell in her living room and had pain and inability to weight-bear.  She had x-rays in the ER which revealed a nondisplaced impacted right femoral neck fracture, traumatic intraventricular hemhorrage (managing conservatively), delayed healing L3 compression fx.; S/p surgical fixation of femur fracture, WBAT;  has a past medical history of Arthritis, Diabetes mellitus without complication (HCC), Hypertension, and Hypokalemia.    PT Comments    Pt required moderate assistance x 2 to stand with RW but unable to safely attempt stand pivot transfer. Pt performed squat pivot transfer with total assistance x 2. Pt limited by confusion, impaired balance, motor planning deficits, and weakness. Progress as tolerated.   Recommendations for follow up therapy are one component of a multi-disciplinary discharge planning process, led by the attending physician.  Recommendations may be updated based on patient status, additional functional criteria and insurance authorization.  Follow Up Recommendations  Skilled nursing-short term rehab (<3 hours/day) Can patient physically be transported by private vehicle: No   Assistance Recommended at Discharge Frequent or constant Supervision/Assistance  Patient can return home with the following Two people to help with walking and/or transfers;Two people to help with bathing/dressing/bathroom;Help with stairs or ramp for entrance   Equipment Recommendations  Wheelchair (measurements PT);Wheelchair cushion (measurements PT);Hospital bed;Other (comment)    Recommendations for Other Services       Precautions / Restrictions Precautions Precautions: Fall Restrictions RLE Weight  Bearing: Weight bearing as tolerated     Mobility  Bed Mobility Overal bed mobility: Needs Assistance Bed Mobility: Supine to Sit     Supine to sit: Max assist, +2 for physical assistance, +2 for safety/equipment     General bed mobility comments: Pt required use of sling pad for supine to sit. No pain reported.    Transfers Overall transfer level: Needs assistance Equipment used: Rolling walker (2 wheels), 2 person hand held assist Transfers: Sit to/from Stand, Bed to chair/wheelchair/BSC Sit to Stand: +2 physical assistance, +2 safety/equipment, Mod assist     Squat pivot transfers: Total assist, +2 physical assistance, +2 safety/equipment     General transfer comment: Pt struggled to obtain feet in proper position prior to sit to stand and needed L foot blocked. Pt required moderate assistance for sit to stand transfer using RW but with poor balance (needed moderate to maximal assistance to stay upright) and B knee buckling. Pt with flexed posture and unable to improve upon despite max cues. Fatigue present. Pt sat back down at EOB and then completed squat pivot transfer- pt held therapist's B elbows in front and tech guided hips to chair.    Ambulation/Gait               General Gait Details: unable   Stairs             Wheelchair Mobility    Modified Rankin (Stroke Patients Only)       Balance     Sitting balance-[name] Scale: Poor (initially poor but then fair)       Standing balance-[name] Scale: Zero                              Cognition Arousal/Alertness: Awake/alert Behavior  During Therapy: Flat affect Overall Cognitive Status: Difficult to assess                                 General Comments: Pt A and O x 1        Exercises      General Comments General comments (skin integrity, edema, etc.): HR 60; RA      Pertinent Vitals/Pain Pain Assessment Pain Assessment: No/denies pain Faces Pain Scale:  Hurts a little bit Pain Location: R hip Pain Descriptors / Indicators: Grimacing Pain Intervention(s): Monitored during session    Home Living                          Prior Function            PT Goals (current goals can now be found in the care plan section) Acute Rehab PT Goals Patient Stated Goal: Agreeable to OOB PT Goal Formulation: With patient/family Time For Goal Achievement: 07/17/22 Potential to Achieve Goals: Good Progress towards PT goals: Progressing toward goals (slow and limited)    Frequency    Min 3X/week      PT Plan Current plan remains appropriate    Co-evaluation              AM-PAC PT "6 Clicks" Mobility   Outcome Measure  Help needed turning from your back to your side while in a flat bed without using bedrails?: A Lot Help needed moving from lying on your back to sitting on the side of a flat bed without using bedrails?: Total Help needed moving to and from a bed to a chair (including a wheelchair)?: Total Help needed standing up from a chair using your arms (e.g., wheelchair or bedside chair)?: Total Help needed to walk in hospital room?: Total Help needed climbing 3-5 steps with a railing? : Total 6 Click Score: 7    End of Session Equipment Utilized During Treatment: Gait belt Activity Tolerance: Patient limited by fatigue;Other (comment) (weakness, motor planning deficits, and decreased cognition) Patient left: in chair;with call bell/phone within reach;with chair alarm set Nurse Communication: Mobility status;Need for lift equipment PT Visit Diagnosis: Other abnormalities of gait and mobility (R26.89);Muscle weakness (generalized) (M62.81);History of falling (Z91.81);Pain Pain - Right/Left: Right Pain - part of body: Hip     Time: 0160-1093 PT Time Calculation (min) (ACUTE ONLY): 19 min  Charges:  $Therapeutic Activity: 8-22 mins                     Tana Coast, PT    [employer] 07/06/2022, 9:04 AM

## 2023-07-25 ENCOUNTER — Emergency Department (HOSPITAL_COMMUNITY): Payer: Medicare PPO

## 2023-07-25 ENCOUNTER — Other Ambulatory Visit: Payer: Self-pay

## 2023-07-25 ENCOUNTER — Inpatient Hospital Stay (HOSPITAL_COMMUNITY)
Admission: EM | Admit: 2023-07-25 | Discharge: 2023-08-09 | DRG: 291 | Disposition: A | Discharge status: Skilled Nursing Facility | Payer: Medicare PPO | Attending: Internal Medicine | Admitting: Internal Medicine

## 2023-07-25 ENCOUNTER — Encounter (HOSPITAL_COMMUNITY): Payer: Self-pay

## 2023-07-25 DIAGNOSIS — I454 Nonspecific intraventricular block: Secondary | ICD-10-CM | POA: Diagnosis not present

## 2023-07-25 DIAGNOSIS — I420 Dilated cardiomyopathy: Secondary | ICD-10-CM | POA: Diagnosis present

## 2023-07-25 DIAGNOSIS — J90 Pleural effusion, not elsewhere classified: Secondary | ICD-10-CM

## 2023-07-25 DIAGNOSIS — R9431 Abnormal electrocardiogram [ECG] [EKG]: Secondary | ICD-10-CM | POA: Diagnosis not present

## 2023-07-25 DIAGNOSIS — Z7401 Bed confinement status: Secondary | ICD-10-CM

## 2023-07-25 DIAGNOSIS — I441 Atrioventricular block, second degree: Secondary | ICD-10-CM | POA: Diagnosis present

## 2023-07-25 DIAGNOSIS — M7989 Other specified soft tissue disorders: Secondary | ICD-10-CM | POA: Diagnosis not present

## 2023-07-25 DIAGNOSIS — L8990 Pressure ulcer of unspecified site, unspecified stage: Secondary | ICD-10-CM

## 2023-07-25 DIAGNOSIS — N1831 Chronic kidney disease, stage 3a: Secondary | ICD-10-CM | POA: Diagnosis present

## 2023-07-25 DIAGNOSIS — Z96642 Presence of left artificial hip joint: Secondary | ICD-10-CM | POA: Diagnosis present

## 2023-07-25 DIAGNOSIS — I447 Left bundle-branch block, unspecified: Secondary | ICD-10-CM | POA: Diagnosis present

## 2023-07-25 DIAGNOSIS — I509 Heart failure, unspecified: Principal | ICD-10-CM

## 2023-07-25 DIAGNOSIS — Z681 Body mass index (BMI) 19 or less, adult: Secondary | ICD-10-CM

## 2023-07-25 DIAGNOSIS — E11649 Type 2 diabetes mellitus with hypoglycemia without coma: Secondary | ICD-10-CM | POA: Diagnosis not present

## 2023-07-25 DIAGNOSIS — L89152 Pressure ulcer of sacral region, stage 2: Secondary | ICD-10-CM | POA: Diagnosis present

## 2023-07-25 DIAGNOSIS — I130 Hypertensive heart and chronic kidney disease with heart failure and stage 1 through stage 4 chronic kidney disease, or unspecified chronic kidney disease: Secondary | ICD-10-CM | POA: Diagnosis present

## 2023-07-25 DIAGNOSIS — Z7984 Long term (current) use of oral hypoglycemic drugs: Secondary | ICD-10-CM

## 2023-07-25 DIAGNOSIS — E876 Hypokalemia: Secondary | ICD-10-CM | POA: Diagnosis present

## 2023-07-25 DIAGNOSIS — I482 Chronic atrial fibrillation, unspecified: Secondary | ICD-10-CM | POA: Diagnosis not present

## 2023-07-25 DIAGNOSIS — E1165 Type 2 diabetes mellitus with hyperglycemia: Secondary | ICD-10-CM | POA: Diagnosis present

## 2023-07-25 DIAGNOSIS — I160 Hypertensive urgency: Secondary | ICD-10-CM | POA: Diagnosis present

## 2023-07-25 DIAGNOSIS — I10 Essential (primary) hypertension: Secondary | ICD-10-CM | POA: Diagnosis not present

## 2023-07-25 DIAGNOSIS — E119 Type 2 diabetes mellitus without complications: Secondary | ICD-10-CM

## 2023-07-25 DIAGNOSIS — G20A1 Parkinson's disease without dyskinesia, without mention of fluctuations: Secondary | ICD-10-CM | POA: Diagnosis present

## 2023-07-25 DIAGNOSIS — Z8616 Personal history of COVID-19: Secondary | ICD-10-CM | POA: Diagnosis not present

## 2023-07-25 DIAGNOSIS — Z66 Do not resuscitate: Secondary | ICD-10-CM | POA: Diagnosis present

## 2023-07-25 DIAGNOSIS — Z90710 Acquired absence of both cervix and uterus: Secondary | ICD-10-CM

## 2023-07-25 DIAGNOSIS — F0280 Dementia in other diseases classified elsewhere without behavioral disturbance: Secondary | ICD-10-CM | POA: Diagnosis present

## 2023-07-25 DIAGNOSIS — Z888 Allergy status to other drugs, medicaments and biological substances status: Secondary | ICD-10-CM | POA: Diagnosis not present

## 2023-07-25 DIAGNOSIS — J9601 Acute respiratory failure with hypoxia: Secondary | ICD-10-CM | POA: Diagnosis not present

## 2023-07-25 DIAGNOSIS — Z9189 Other specified personal risk factors, not elsewhere classified: Secondary | ICD-10-CM

## 2023-07-25 DIAGNOSIS — I5021 Acute systolic (congestive) heart failure: Secondary | ICD-10-CM | POA: Diagnosis not present

## 2023-07-25 DIAGNOSIS — Z79899 Other long term (current) drug therapy: Secondary | ICD-10-CM | POA: Diagnosis not present

## 2023-07-25 DIAGNOSIS — E43 Unspecified severe protein-calorie malnutrition: Secondary | ICD-10-CM | POA: Diagnosis present

## 2023-07-25 DIAGNOSIS — Z8249 Family history of ischemic heart disease and other diseases of the circulatory system: Secondary | ICD-10-CM

## 2023-07-25 DIAGNOSIS — J918 Pleural effusion in other conditions classified elsewhere: Secondary | ICD-10-CM | POA: Diagnosis present

## 2023-07-25 DIAGNOSIS — I5042 Chronic combined systolic (congestive) and diastolic (congestive) heart failure: Secondary | ICD-10-CM | POA: Insufficient documentation

## 2023-07-25 DIAGNOSIS — Z823 Family history of stroke: Secondary | ICD-10-CM

## 2023-07-25 DIAGNOSIS — R1310 Dysphagia, unspecified: Secondary | ICD-10-CM | POA: Diagnosis present

## 2023-07-25 DIAGNOSIS — Z7189 Other specified counseling: Secondary | ICD-10-CM | POA: Diagnosis not present

## 2023-07-25 DIAGNOSIS — Z23 Encounter for immunization: Secondary | ICD-10-CM

## 2023-07-25 DIAGNOSIS — Z515 Encounter for palliative care: Secondary | ICD-10-CM | POA: Diagnosis not present

## 2023-07-25 DIAGNOSIS — Z1152 Encounter for screening for COVID-19: Secondary | ICD-10-CM

## 2023-07-25 DIAGNOSIS — I5023 Acute on chronic systolic (congestive) heart failure: Secondary | ICD-10-CM | POA: Diagnosis present

## 2023-07-25 DIAGNOSIS — M1990 Unspecified osteoarthritis, unspecified site: Secondary | ICD-10-CM | POA: Diagnosis present

## 2023-07-25 DIAGNOSIS — E1122 Type 2 diabetes mellitus with diabetic chronic kidney disease: Secondary | ICD-10-CM | POA: Diagnosis present

## 2023-07-25 DIAGNOSIS — R001 Bradycardia, unspecified: Secondary | ICD-10-CM | POA: Diagnosis not present

## 2023-07-25 DIAGNOSIS — R636 Underweight: Secondary | ICD-10-CM | POA: Diagnosis present

## 2023-07-25 LAB — COMPREHENSIVE METABOLIC PANEL
ALT: 45 U/L — ABNORMAL HIGH (ref 0–44)
AST: 31 U/L (ref 15–41)
Albumin: 3.2 g/dL — ABNORMAL LOW (ref 3.5–5.0)
Alkaline Phosphatase: 76 U/L (ref 38–126)
Anion gap: 8 (ref 5–15)
BUN: 19 mg/dL (ref 8–23)
CO2: 25 mmol/L (ref 22–32)
Calcium: 8.3 mg/dL — ABNORMAL LOW (ref 8.9–10.3)
Chloride: 103 mmol/L (ref 98–111)
Creatinine, Ser: 1.06 mg/dL — ABNORMAL HIGH (ref 0.44–1.00)
GFR, Estimated: 49 mL/min — ABNORMAL LOW (ref >60)
Glucose, Bld: 255 mg/dL — ABNORMAL HIGH (ref 70–99)
Potassium: 3.1 mmol/L — ABNORMAL LOW (ref 3.5–5.1)
Sodium: 136 mmol/L (ref 135–145)
Total Bilirubin: 0.9 mg/dL (ref 0.3–1.2)
Total Protein: 6.1 g/dL — ABNORMAL LOW (ref 6.5–8.1)

## 2023-07-25 LAB — CBC
HCT: 35.8 % — ABNORMAL LOW (ref 36.0–46.0)
Hemoglobin: 11.2 g/dL — ABNORMAL LOW (ref 12.0–15.0)
MCH: 29.8 pg (ref 26.0–34.0)
MCHC: 31.3 g/dL (ref 30.0–36.0)
MCV: 95.2 fL (ref 80.0–100.0)
Platelets: 240 10*3/uL (ref 150–400)
RBC: 3.76 MIL/uL — ABNORMAL LOW (ref 3.87–5.11)
RDW: 16 % — ABNORMAL HIGH (ref 11.5–15.5)
WBC: 5.2 10*3/uL (ref 4.0–10.5)
nRBC: 0 % (ref 0.0–0.2)

## 2023-07-25 LAB — MAGNESIUM: Magnesium: 2 mg/dL (ref 1.7–2.4)

## 2023-07-25 LAB — RESP PANEL BY RT-PCR (RSV, FLU A&B, COVID)  RVPGX2
Influenza A by PCR: NEGATIVE
Influenza B by PCR: NEGATIVE
Resp Syncytial Virus by PCR: NEGATIVE
SARS Coronavirus 2 by RT PCR: NEGATIVE

## 2023-07-25 LAB — BRAIN NATRIURETIC PEPTIDE: B Natriuretic Peptide: >4500 pg/mL — ABNORMAL HIGH (ref 0.0–100.0)

## 2023-07-25 LAB — CBG MONITORING, ED: Glucose-Capillary: 262 mg/dL — ABNORMAL HIGH (ref 70–99)

## 2023-07-25 MED ORDER — POTASSIUM CHLORIDE 10 MEQ/100ML IV SOLN: 10.0000 meq | INTRAVENOUS | Status: DC

## 2023-07-25 MED ORDER — ONDANSETRON HCL 4 MG/2ML IJ SOLN: 4.0000 mg | Freq: Four times a day (QID) | INTRAMUSCULAR | Status: DC | PRN

## 2023-07-25 MED ORDER — POTASSIUM CHLORIDE CRYS ER 10 MEQ PO TBCR: 20.0000 meq | EXTENDED_RELEASE_TABLET | Freq: Every day | ORAL | Status: DC

## 2023-07-25 MED ORDER — ACETAMINOPHEN 325 MG PO TABS: 650.0000 mg | ORAL_TABLET | ORAL | Status: DC | PRN

## 2023-07-25 MED ORDER — LISINOPRIL 2.5 MG PO TABS: 2.5000 mg | ORAL_TABLET | Freq: Every day | ORAL | Status: DC

## 2023-07-25 MED ORDER — ACETAMINOPHEN 325 MG PO TABS
650.0000 mg | ORAL_TABLET | Freq: Four times a day (QID) | ORAL | Status: DC | PRN
  Filled 2023-07-25: qty 2

## 2023-07-25 MED ORDER — GLIMEPIRIDE 4 MG PO TABS: 4.0000 mg | ORAL_TABLET | Freq: Every day | ORAL | Status: DC

## 2023-07-25 MED ORDER — DOCUSATE SODIUM 100 MG PO CAPS
100.0000 mg | ORAL_CAPSULE | Freq: Two times a day (BID) | ORAL | Status: DC
  Filled 2023-07-25: qty 1

## 2023-07-25 MED ORDER — POTASSIUM CHLORIDE CRYS ER 20 MEQ PO TBCR
40.0000 meq | EXTENDED_RELEASE_TABLET | Freq: Once | ORAL | Status: DC
  Filled 2023-07-25: qty 2

## 2023-07-25 MED ORDER — SODIUM CHLORIDE 0.9 % IV SOLN: 250.0000 mL | INTRAVENOUS | Status: DC | PRN

## 2023-07-25 MED ORDER — BRINZOLAMIDE 1 % OP SUSP
1.0000 [drp] | Freq: Every day | OPHTHALMIC | Status: DC
  Administered 2023-07-26 – 2023-08-08 (×15): 1 [drp] via OPHTHALMIC
  Filled 2023-07-25 (×2): qty 10

## 2023-07-25 MED ORDER — VITAMIN D (ERGOCALCIFEROL) 1.25 MG (50000 UNIT) PO CAPS
50000.0000 [IU] | ORAL_CAPSULE | ORAL | Status: DC
  Administered 2023-08-05: 50000 [IU] via ORAL
  Filled 2023-07-25 (×2): qty 1

## 2023-07-25 MED ORDER — SODIUM CHLORIDE 0.9% FLUSH: 3.0000 mL | INTRAVENOUS | Status: DC | PRN

## 2023-07-25 MED ORDER — SODIUM CHLORIDE 0.9% FLUSH
3.0000 mL | Freq: Two times a day (BID) | INTRAVENOUS | Status: DC
  Administered 2023-07-26 – 2023-07-31 (×13): 3 mL via INTRAVENOUS

## 2023-07-25 MED ORDER — POTASSIUM CHLORIDE CRYS ER 20 MEQ PO TBCR: 40.0000 meq | EXTENDED_RELEASE_TABLET | Freq: Once | ORAL | Status: DC

## 2023-07-25 MED ORDER — ENOXAPARIN SODIUM 30 MG/0.3ML IJ SOSY: 30.0000 mg | PREFILLED_SYRINGE | INTRAMUSCULAR | Status: DC

## 2023-07-25 MED ORDER — INSULIN ASPART 100 UNIT/ML IJ SOLN
0.0000 [IU] | INTRAMUSCULAR | Status: DC
  Administered 2023-07-26: 2 [IU] via SUBCUTANEOUS
  Administered 2023-07-26 – 2023-07-27 (×2): 3 [IU] via SUBCUTANEOUS
  Administered 2023-07-27: 2 [IU] via SUBCUTANEOUS
  Administered 2023-07-28 (×2): 3 [IU] via SUBCUTANEOUS
  Administered 2023-07-30: 2 [IU] via SUBCUTANEOUS

## 2023-07-25 MED ORDER — FUROSEMIDE 10 MG/ML IJ SOLN
40.0000 mg | Freq: Two times a day (BID) | INTRAMUSCULAR | Status: DC
  Administered 2023-07-26 – 2023-07-27 (×3): 40 mg via INTRAVENOUS
  Filled 2023-07-25 (×3): qty 4

## 2023-07-25 MED ORDER — LATANOPROST 0.005 % OP SOLN
1.0000 [drp] | Freq: Every day | OPHTHALMIC | Status: DC
  Administered 2023-07-26 – 2023-08-08 (×15): 1 [drp] via OPHTHALMIC
  Filled 2023-07-25: qty 2.5

## 2023-07-25 MED ORDER — METOPROLOL SUCCINATE ER 25 MG PO TB24
50.0000 mg | ORAL_TABLET | Freq: Every day | ORAL | Status: DC
  Administered 2023-07-27 – 2023-08-09 (×12): 50 mg via ORAL
  Filled 2023-07-25 (×15): qty 2

## 2023-07-25 MED ORDER — POLYETHYLENE GLYCOL 3350 17 G PO PACK
17.0000 g | PACK | Freq: Every day | ORAL | Status: DC | PRN
  Filled 2023-07-25: qty 1

## 2023-07-25 MED ORDER — LIFITEGRAST 5 % OP SOLN: 1.0000 [drp] | Freq: Every day | OPHTHALMIC | Status: DC

## 2023-07-25 MED ORDER — FUROSEMIDE 10 MG/ML IJ SOLN
40.0000 mg | Freq: Once | INTRAMUSCULAR | Status: AC
  Administered 2023-07-26: 40 mg via INTRAVENOUS
  Filled 2023-07-25: qty 4

## 2023-07-25 MED ORDER — INSULIN ASPART 100 UNIT/ML IJ SOLN: 0.0000 [IU] | Freq: Three times a day (TID) | INTRAMUSCULAR | Status: DC

## 2023-07-25 MED ORDER — LOSARTAN POTASSIUM 50 MG PO TABS
50.0000 mg | ORAL_TABLET | Freq: Every day | ORAL | Status: DC
  Administered 2023-07-26 – 2023-07-29 (×4): 50 mg via ORAL
  Filled 2023-07-25 (×5): qty 1

## 2023-07-25 MED ORDER — POTASSIUM CHLORIDE 10 MEQ/100ML IV SOLN
10.0000 meq | INTRAVENOUS | Status: AC
  Administered 2023-07-26 (×3): 10 meq via INTRAVENOUS
  Filled 2023-07-25 (×3): qty 100

## 2023-07-25 MED ORDER — INSULIN ASPART 100 UNIT/ML IJ SOLN: 0.0000 [IU] | Freq: Every day | INTRAMUSCULAR | Status: DC

## 2023-07-25 NOTE — Progress Notes (Signed)
Right lower extremity venous duplex has been completed. Preliminary results can be found in CV Proc through chart review.  Results were given to Lorin Roemhildt PA.  07/25/23 4:44 PM Olen Cordial RVT

## 2023-07-25 NOTE — ED Provider Triage Note (Addendum)
Emergency Medicine Provider Triage Evaluation Note  Alicia Vang , a 89 y.o. female  was evaluated in triage.  Pt's daughter brings her in today for leg swelling and SOB. Daughter states patient has had leg swelling for a few days, R > L. She has been checking her BP at home and seeing systolics sometimes over 200. Her blood sugar has also been elevated. She has noticed patient gasping for breath. Does not wear oxygen chronically.  Patient not talking, staring when we try to ask questions. Daughter says this is not abnormal for her. She has not given her her second dose of losartan today yet. Daughter does not believe patient is on diuretics.   Per chart review, pt has percutaneous screw fixation of Right hip by orthopedics on 9/10 after femur fracture on 9/9.   Review of Systems  Positive: SOB, leg swelling Negative:   Physical Exam  BP (!) 174/99 (BP Location: Right Arm)   Pulse (!) 114   Temp 98.5 F (36.9 C) (Oral)   Resp 15   Ht 5\' 6"  (1.676 m)   Wt 39.5 kg   SpO2 91%   BMI 14.04 kg/m  Gen:   Awake, no distress   Resp:  Normal effort  MSK:   Moves extremities without difficulty  Other:  2+ pitting edema to R leg to mid shin, 1+ pitting edema to L leg to ankle , able to follow commands   Medical Decision Making  Medically screening exam initiated at 4:14 PM.  Appropriate orders placed.  Alicia Vang was informed that the remainder of the evaluation will be completed by another provider, this initial triage assessment does not replace that evaluation, and the importance of remaining in the ED until their evaluation is complete.  Workup initiated including blood work and DVT study. Differential including volume overload, DVT, PE, infectious etiology   Alicia Hollinger T, PA-C 07/25/23 1614   1640 -- Vascular US performed in triage, negative for DVT in R leg   Alicia Crunkleton T, PA-C 07/25/23 1641

## 2023-07-25 NOTE — H&P (Incomplete)
PCP:   Knox Royalty, MD   Chief Complaint:  SOB  HPI: This is a 89 year old female who lives at home with her daughter.  Patient has been bedbound since hip surgery approximately a year ago.  Her past medical history significant for HTN, Parkinson's disease.  Approximately a week ago her daughter noted patient began breathing heavier.  The PCP was contacted, noted directed to monitor patient's oxygen.  Patient's oxygen has been good approximate 95% and average on room air.  Yesterday it was noted patient appeared to be gasping for breath while talking.  SBP check was over 200.  Last send patient had a rough time with breathing, eventually stating she did not feel right.  Appetite has been low, there is no reports of fevers or chills.  Patient does not complain of chest pains.  Daughter repeats swelling on the right lower extremity.  She additionally states the patient occasionally chokes when she is not on food or drinks water, this has been slightly worse.  The patient was states to the ER.  In the ER patient hemodynamically stable.  Potassium 3.1, serum glucose 262, BNP >4,500. COVID-negative. CXR moderate B/L fluid effusion.  Patient mildly hypoxic satting 90-91%.  She has been placed on 2 L oxygen, satting 100%.  Review of Systems:  Per HPI  Past Medical History: Past Medical History:  Diagnosis Date   Arthritis    Diabetes mellitus without complication (HCC)    Type II   Hypertension    Hypokalemia    Past Surgical History:  Procedure Laterality Date   ABDOMINAL HYSTERECTOMY     CHALAZION EXCISION  11/06/2011   Procedure: MINOR EXCISION OF CHALAZION;  Surgeon: Vita Erm.;  Location: [facility];  Service: Ophthalmology;  Laterality: Left;   CHALAZION EXCISION  02/02/2012   Procedure: MINOR EXCISION OF CHALAZION;  Surgeon: Vita Erm., MD;  Location: [facility];  Service: Ophthalmology;  Laterality: Left;  left eye upper lid    CHALAZION EXCISION Right 03/28/2013   Procedure: MINOR EXCISION OF CHALAZION upper and lower right eye ;  Surgeon: Vita Erm., MD;  Location: [facility];  Service: Ophthalmology;  Laterality: Right;   CHALAZION EXCISION Right 03/28/2013   Procedure: MINOR EXCISION OF CHALAZION UPPER AND LOWER RIGHT EYE  ;  Surgeon: Vita Erm., MD;  Location: [facility] OR;  Service: Ophthalmology;  Laterality: Right;   COLONOSCOPY     HIP PINNING,CANNULATED Right 07/02/2022   Procedure: PERCUTANEOUS SCREW FIXATION OF HIP;  Surgeon: Jones Broom, MD;  Location: [facility] OR;  Service: Orthopedics;  Laterality: Right;   TOTAL HIP ARTHROPLASTY Left 10/26/2017   Procedure: TOTAL HIP ARTHROPLASTY ANTERIOR APPROACH;  Surgeon: Gean Birchwood, MD;  Location: [facility] OR;  Service: Orthopedics;  Laterality: Left;    Medications: Prior to Admission medications   Medication Sig Start Date End Date Taking? Authorizing Provider  acetaminophen (TYLENOL) 325 MG tablet Take 2 tablets (650 mg total) by mouth every 6 (six) hours as needed for mild pain or headache (fever >/= 101). 10/24/20  Yes Lonia Blood, MD  brinzolamide (AZOPT) 1 % ophthalmic suspension Place 1 drop into both eyes at bedtime. 03/10/21  Yes [provider]  docusate sodium (COLACE) 100 MG capsule Take 1 capsule (100 mg total) by mouth 2 (two) times daily. 07/06/22  Yes Narda Bonds, MD  glimepiride (AMARYL) 4 MG tablet Take 4 mg by mouth daily. 07/05/23  Yes [provider]  HYDROcodone-acetaminophen (NORCO/VICODIN) 5-325 MG tablet Take 1 tablet by mouth every 6 (six) hours as needed for severe pain. 07/06/22  Yes Narda Bonds, MD  latanoprost (XALATAN) 0.005 % ophthalmic solution Place 1 drop into both eyes at bedtime.   Yes [provider]  losartan (COZAAR) 100 MG tablet Take 100 mg by mouth daily.   Yes [provider]  Metoprolol Succinate 50 MG CS24 Take 50 mg by mouth daily.    Yes [provider]  polyethylene glycol (MIRALAX / GLYCOLAX) 17 g packet Take 17 g by mouth daily as needed for mild constipation. 07/06/22  Yes Narda Bonds, MD  Potassium Chloride ER 20 MEQ TBCR Take 20 mEq by mouth daily. 06/06/21  Yes Zigmund Daniel., MD  Vitamin D, Ergocalciferol, (DRISDOL) 1.25 MG (50000 UNIT) CAPS capsule Take 50,000 Units by mouth once a week. 04/29/22  Yes [provider]  XIIDRA 5 % SOLN Place 1 drop into both eyes at bedtime. 07/26/20  Yes [provider]  diclofenac Sodium (VOLTAREN) 1 % GEL Apply 2 g topically 4 (four) times daily as needed (lower back pain as needed). Patient not taking: Reported on 07/02/2022 06/06/21   Zigmund Daniel., MD  XARELTO 15 MG TABS tablet Take 15 mg by mouth daily. Patient not taking: Reported on 06/01/2021 03/17/21 06/01/21  [provider]    Allergies:   Allergies  Allergen Reactions   Amlodipine Swelling    Swelling in feet    Social History:  reports that she has never smoked. She has never used smokeless tobacco. She reports that she does not drink alcohol and does not use drugs.  Family History: Family History  Problem Relation Age of Onset   Stroke Mother    Hypertension Mother    Cataracts Mother    Cataracts Father    Heart attack Neg Hx     Physical Exam: Vitals:   07/25/23 1728 07/25/23 1845 07/25/23 1935 07/25/23 2110  BP: (!) 162/94 (!) 190/110 (!) 181/106 (!) 164/98  Pulse: 71 (!) 57 67 68  Resp: (!) 33 (!) 27 (!) 24 18  Temp:      TempSrc:      SpO2: 99% 100% 100% 100%  Weight:      Height:        General: A&O x 3, frail elderly female, poor communicator Eyes: Pink conjunctiva, no scleral icterus ENT: Moist oral mucosa, neck supple Lungs: CTA B/L, no wheeze,decreased breath sounds LLL, no use of accessory muscles Cardiovascular: RRR, positive JVD Abdomen: soft, positive BS, NTND, not an acute abdomen GU: not examined Neuro: CN II - XII grossly intact, sensation  intact Musculoskeletal: strength 5/5 all extremities, no edema Skin: no rash, no subcutaneous crepitation, no decubitus Psych: appropriate patient   Labs on Admission:  Recent Labs    07/25/23 1621  NA 136  K 3.1*  CL 103  CO2 25  GLUCOSE 255*  BUN 19  CREATININE 1.06*  CALCIUM 8.3*   Recent Labs    07/25/23 1621  AST 31  ALT 45*  ALKPHOS 76  BILITOT 0.9  PROT 6.1*  ALBUMIN 3.2*    Recent Labs    07/25/23 1621  WBC 5.2  HGB 11.2*  HCT 35.8*  MCV 95.2  PLT 240    Micro Results: Recent Results (from the past 240 hour(s))  Resp panel by RT-PCR (RSV, Flu A&B, Covid) Anterior Nasal Swab     Status:  None   Collection Time: 07/25/23  4:21 PM   Specimen: Anterior Nasal Swab  Result Value Ref Range Status   SARS Coronavirus 2 by RT PCR NEGATIVE NEGATIVE Final   Influenza A by PCR NEGATIVE NEGATIVE Final   Influenza B by PCR NEGATIVE NEGATIVE Final    Comment: (NOTE) The Xpert Xpress SARS-CoV-2/FLU/RSV plus assay is intended as an aid in the diagnosis of influenza from Nasopharyngeal swab specimens and should not be used as a sole basis for treatment. Nasal washings and aspirates are unacceptable for Xpert Xpress SARS-CoV-2/FLU/RSV testing.  Fact Sheet for Patients: BloggerCourse.com  Fact Sheet for Healthcare Providers: SeriousBroker.it  This test is not yet approved or cleared by the [place] FDA and has been authorized for detection and/or diagnosis of SARS-CoV-2 by FDA under an Emergency Use Authorization (EUA). This EUA will remain in effect (meaning this test can be used) for the duration of the COVID-19 declaration under Section 564(b)(1) of the Act, 21 U.S.C. section 360bbb-3(b)(1), unless the authorization is terminated or revoked.     Resp Syncytial Virus by PCR NEGATIVE NEGATIVE Final    Comment: (NOTE) Fact Sheet for Patients: BloggerCourse.com  Fact Sheet for  Healthcare Providers: SeriousBroker.it  This test is not yet approved or cleared by the [place] FDA and has been authorized for detection and/or diagnosis of SARS-CoV-2 by FDA under an Emergency Use Authorization (EUA). This EUA will remain in effect (meaning this test can be used) for the duration of the COVID-19 declaration under Section 564(b)(1) of the Act, 21 U.S.C. section 360bbb-3(b)(1), unless the authorization is terminated or revoked.  Performed at [facility], [address] [place], [place] [address]      Radiological Exams on Admission: DG Chest Portable 1 View  Result Date: 07/25/2023 CLINICAL DATA:  Shortness of breath, hypoxia EXAM: PORTABLE CHEST 1 VIEW COMPARISON:  07/01/2022 FINDINGS: Moderate bilateral pleural effusions. Bibasilar atelectasis. Heart mediastinal contours within normal limits. Aortic atherosclerosis. IMPRESSION: Moderate bilateral pleural effusions with bibasilar atelectasis. Electronically Signed   By: Charlett Nose M.D.   On: 07/25/2023 19:12   VAS [place] LOWER EXTREMITY VENOUS (DVT) (7a-7p)  Result Date: 07/25/2023  Lower Venous DVT Study Patient Name:  Alicia Vang  Date of Exam:   07/25/2023 Medical Rec #: [id number]            Accession #:    [id number] Date of Birth: 01-24-1927           Patient Gender: F Patient Age:   61 years Exam Location:  [facility] Procedure:      VAS [place] LOWER EXTREMITY VENOUS (DVT) Referring Phys: Griffin Basil ROEMHILDT --------------------------------------------------------------------------------  Indications: Swelling.  Risk Factors: None identified. Limitations: Poor ultrasound/tissue interface and patient positioning. Comparison Study: No prior studies. Performing Technologist: Chanda Busing RVT  Examination Guidelines: A complete evaluation includes B-mode imaging, spectral Doppler, color Doppler, and power Doppler as needed of all accessible portions of each vessel. Bilateral  testing is considered an integral part of a complete examination. Limited examinations for reoccurring indications may be performed as noted. The reflux portion of the exam is performed with the patient in reverse Trendelenburg.  +---------+---------------+---------+-----------+----------+--------------+ RIGHT    CompressibilityPhasicitySpontaneityPropertiesThrombus Aging +---------+---------------+---------+-----------+----------+--------------+ CFV      Full           Yes      No                                  +---------+---------------+---------+-----------+----------+--------------+  SFJ      Full                                                        +---------+---------------+---------+-----------+----------+--------------+ FV Prox  Full                                                        +---------+---------------+---------+-----------+----------+--------------+ FV Mid   Full                                                        +---------+---------------+---------+-----------+----------+--------------+ FV DistalFull                                                        +---------+---------------+---------+-----------+----------+--------------+ PFV      Full                                                        +---------+---------------+---------+-----------+----------+--------------+ POP      Full           Yes      No                                  +---------+---------------+---------+-----------+----------+--------------+ PTV      Full                                                        +---------+---------------+---------+-----------+----------+--------------+ PERO     Full                                                        +---------+---------------+---------+-----------+----------+--------------+   +----+---------------+---------+-----------+----------+--------------+  LEFTCompressibilityPhasicitySpontaneityPropertiesThrombus Aging +----+---------------+---------+-----------+----------+--------------+ CFV Full           Yes      No                                  +----+---------------+---------+-----------+----------+--------------+    Summary: RIGHT: - There is no evidence of deep vein thrombosis in the lower extremity.  - No cystic structure found in the popliteal fossa.  LEFT: - No evidence of common femoral vein obstruction.   *See table(s) above for measurements and observations.    Preliminary  Assessment/Plan Present on Admission:  Acute congestive heart filure, new diagnosis //  Moderate bilateral pleural effusions  -CHF orderset initiated -Strict I's and O's, daily weights -Continue oxygen to keep sats greater than 88% -IV Lasix every 12hrs -Echo in the a.m. -Metoprolol and Cozaar resumed -Thoracentesis ordered left-sided -Cardiology consulted via epic   Hypokalemia -Mag level ordered -PO and IV potassium ordered   Prolonged QT -QTc 526 -Replete IV potassium and magnesium.  Repeat EKG in a.m.   Dysphagia -N.p.o., speech consult   Essential hypertension -Metoprolol, Cozaar resumed   T2DM -Sliding scale insulin resume  Suhail Peloquin 07/25/2023, 11:13 PM

## 2023-07-25 NOTE — ED Triage Notes (Signed)
Pt coming in via POV with daughter. Pt unable to answer any questions. Family states pt has been gasping for air and has a swollen right foot. Pt family states she took pt's vitals at home."Last night she could hardly breathe and had a lot of congestion." Pt family states the right foot just started swelling the past couple of days and her blood sugar has been elevated. Daughter states pt is sometimes able to answer questions. Pt oriented to self.

## 2023-07-25 NOTE — ED Notes (Signed)
Pt was put a on a bed pan and did not use the bathroom. Unable to obtain urine sample.

## 2023-07-25 NOTE — ED Provider Notes (Signed)
[place] EMERGENCY DEPARTMENT AT [facility] Provider Note   CSN: [id number] Arrival date & time: 07/25/23  1554     History  Chief Complaint  Patient presents with   Shortness of Breath   Leg Swelling    Alicia Vang is a 89 y.o. female.  Patient's daughter reports patient has leg swelling and increasing shortness of breath.  She reports patient was breathing really hard last night when she was sleeping and is breathing hard today.  She reports patient does not ambulate since having hip surgery for a fractured hip.  Patient's daughter reports the patient has not had her evening medications.  Patient's daughter reports right leg is more swollen than left.  Patient has not had any fever or chills she has not had any cough or congestion.  Patient's daughter reports patient's blood pressure has been elevated and her glucose has been elevated.  Patient's oxygen level has been lower at home.  Patient is not on oxygen at home.  The history is provided by the patient. No language interpreter was used.  Shortness of Breath Severity:  Moderate Onset quality:  Sudden Duration:  2 days Timing:  Constant Progression:  Worsening Chronicity:  New Context: not URI   Relieved by:  Nothing Worsened by:  Nothing Ineffective treatments:  None tried Associated symptoms: no fever and no headaches   Risk factors: no recent alcohol use and no hx of PE/DVT        Home Medications Prior to Admission medications   Medication Sig Start Date End Date Taking? Authorizing Provider  acetaminophen (TYLENOL) 325 MG tablet Take 2 tablets (650 mg total) by mouth every 6 (six) hours as needed for mild pain or headache (fever >/= 101). 10/24/20  Yes Lonia Blood, MD  brinzolamide (AZOPT) 1 % ophthalmic suspension Place 1 drop into both eyes at bedtime. 03/10/21  Yes [provider]  docusate sodium (COLACE) 100 MG capsule Take 1 capsule (100 mg total) by mouth 2 (two) times  daily. 07/06/22  Yes Narda Bonds, MD  glimepiride (AMARYL) 4 MG tablet Take 4 mg by mouth daily. 07/05/23  Yes [provider]  HYDROcodone-acetaminophen (NORCO/VICODIN) 5-325 MG tablet Take 1 tablet by mouth every 6 (six) hours as needed for severe pain. 07/06/22  Yes Narda Bonds, MD  latanoprost (XALATAN) 0.005 % ophthalmic solution Place 1 drop into both eyes at bedtime.   Yes [provider]  losartan (COZAAR) 100 MG tablet Take 100 mg by mouth daily.   Yes [provider]  Metoprolol Succinate 50 MG CS24 Take 50 mg by mouth daily.    Yes [provider]  polyethylene glycol (MIRALAX / GLYCOLAX) 17 g packet Take 17 g by mouth daily as needed for mild constipation. 07/06/22  Yes Narda Bonds, MD  Potassium Chloride ER 20 MEQ TBCR Take 20 mEq by mouth daily. 06/06/21  Yes Zigmund Daniel., MD  Vitamin D, Ergocalciferol, (DRISDOL) 1.25 MG (50000 UNIT) CAPS capsule Take 50,000 Units by mouth once a week. 04/29/22  Yes [provider]  XIIDRA 5 % SOLN Place 1 drop into both eyes at bedtime. 07/26/20  Yes [provider]  diclofenac Sodium (VOLTAREN) 1 % GEL Apply 2 g topically 4 (four) times daily as needed (lower back pain as needed). Patient not taking: Reported on 07/02/2022 06/06/21   Zigmund Daniel., MD  XARELTO 15 MG TABS tablet Take 15 mg by mouth daily. Patient not  taking: Reported on 06/01/2021 03/17/21 06/01/21  [provider]      Allergies    Amlodipine    Review of Systems   Review of Systems  Constitutional:  Negative for fever.  Respiratory:  Positive for shortness of breath.   Neurological:  Negative for headaches.  All other systems reviewed and are negative.   Physical Exam Updated Vital Signs BP (!) 164/98 (BP Location: Left Arm)   Pulse 68   Temp 98.5 F (36.9 C) (Oral)   Resp 18   Ht 5\' 6"  (1.676 m)   Wt 39.5 kg   SpO2 100%   BMI 14.04 kg/m  Physical Exam Vitals and nursing note  reviewed.  Constitutional:      Appearance: She is well-developed.  HENT:     Head: Normocephalic.  Cardiovascular:     Rate and Rhythm: Normal rate.  Pulmonary:     Effort: Pulmonary effort is normal. Tachypnea present.  Abdominal:     General: There is no distension.     Palpations: Abdomen is soft.  Musculoskeletal:     Cervical back: Normal range of motion.     Right lower leg: Tenderness present. Edema present.     Comments: Swollen right leg,  nontender, negative homan's.    Skin:    General: Skin is warm.  Neurological:     General: No focal deficit present.     Mental Status: She is alert and oriented to person, place, and time.     ED Results / Procedures / Treatments   Labs (all labs ordered are listed, but only abnormal results are displayed) Labs Reviewed  CBC - Abnormal; Notable for the following components:      Result Value   RBC 3.76 (*)    Hemoglobin 11.2 (*)    HCT 35.8 (*)    RDW 16.0 (*)    All other components within normal limits  COMPREHENSIVE METABOLIC PANEL - Abnormal; Notable for the following components:   Potassium 3.1 (*)    Glucose, Bld 255 (*)    Creatinine, Ser 1.06 (*)    Calcium 8.3 (*)    Total Protein 6.1 (*)    Albumin 3.2 (*)    ALT 45 (*)    GFR, Estimated 49 (*)    All other components within normal limits  BRAIN NATRIURETIC PEPTIDE - Abnormal; Notable for the following components:   B Natriuretic Peptide >4,500.0 (*)    All other components within normal limits  CBG MONITORING, ED - Abnormal; Notable for the following components:   Glucose-Capillary 262 (*)    All other components within normal limits  RESP PANEL BY RT-PCR (RSV, FLU A&B, COVID)  [id number]    EKG EKG Interpretation Date/Time:  Wednesday July 25 2023 16:52:48 EDT Ventricular Rate:  74 PR Interval:  176 QRS Duration:  140 QT Interval:  474 QTC Calculation: 526 R Axis:   269  Text Interpretation: Sinus rhythm with Premature atrial complexes  Non-specific intra-ventricular conduction block When compared with ECG of 25-Jul-2023 16:27, PREVIOUS ECG IS PRESENT Confirmed by Alvester Chou [phone]) on 07/25/2023 7:27:58 PM  Radiology DG Chest Portable 1 View  Result Date: 07/25/2023 CLINICAL DATA:  Shortness of breath, hypoxia EXAM: PORTABLE CHEST 1 VIEW COMPARISON:  07/01/2022 FINDINGS: Moderate bilateral pleural effusions. Bibasilar atelectasis. Heart mediastinal contours within normal limits. Aortic atherosclerosis. IMPRESSION: Moderate bilateral pleural effusions with bibasilar atelectasis. Electronically Signed   By: Charlett Nose M.D.   On: 07/25/2023 19:12  VAS [place] LOWER EXTREMITY VENOUS (DVT) (7a-7p)  Result Date: 07/25/2023  Lower Venous DVT Study Patient Name:  AMMARAH MYSZKA  Date of Exam:   07/25/2023 Medical Rec #: [id number]            Accession #:    [id number] Date of Birth: [birth year]           Patient Gender: F Patient Age:   53 years Exam Location:  [facility] Procedure:      VAS [place] LOWER EXTREMITY VENOUS (DVT) Referring Phys: Griffin Basil ROEMHILDT --------------------------------------------------------------------------------  Indications: Swelling.  Risk Factors: None identified. Limitations: Poor ultrasound/tissue interface and patient positioning. Comparison Study: No prior studies. Performing Technologist: Chanda Busing RVT  Examination Guidelines: A complete evaluation includes B-mode imaging, spectral Doppler, color Doppler, and power Doppler as needed of all accessible portions of each vessel. Bilateral testing is considered an integral part of a complete examination. Limited examinations for reoccurring indications may be performed as noted. The reflux portion of the exam is performed with the patient in reverse Trendelenburg.  +---------+---------------+---------+-----------+----------+--------------+ RIGHT    CompressibilityPhasicitySpontaneityPropertiesThrombus Aging  +---------+---------------+---------+-----------+----------+--------------+ CFV      Full           Yes      No                                  +---------+---------------+---------+-----------+----------+--------------+ SFJ      Full                                                        +---------+---------------+---------+-----------+----------+--------------+ FV Prox  Full                                                        +---------+---------------+---------+-----------+----------+--------------+ FV Mid   Full                                                        +---------+---------------+---------+-----------+----------+--------------+ FV DistalFull                                                        +---------+---------------+---------+-----------+----------+--------------+ PFV      Full                                                        +---------+---------------+---------+-----------+----------+--------------+ POP      Full           Yes      No                                  +---------+---------------+---------+-----------+----------+--------------+  PTV      Full                                                        +---------+---------------+---------+-----------+----------+--------------+ PERO     Full                                                        +---------+---------------+---------+-----------+----------+--------------+   +----+---------------+---------+-----------+----------+--------------+ LEFTCompressibilityPhasicitySpontaneityPropertiesThrombus Aging +----+---------------+---------+-----------+----------+--------------+ CFV Full           Yes      No                                  +----+---------------+---------+-----------+----------+--------------+    Summary: RIGHT: - There is no evidence of deep vein thrombosis in the lower extremity.  - No cystic structure found in the popliteal fossa.   LEFT: - No evidence of common femoral vein obstruction.   *See table(s) above for measurements and observations.    Preliminary     Procedures Procedures    Medications Ordered in ED Medications  furosemide (LASIX) injection 40 mg (has no administration in time range)    ED Course/ Medical Decision Making/ A&P                                 Medical Decision Making Patient's daughter reports patient has had increasing shortness of breath since yesterday.  Amount and/or Complexity of Data Reviewed Independent Historian: caregiver    Details: Pt is here with daughter who is supportive  Labs: ordered. Decision-making details documented in ED Course.    Details: Labs ordered reviewed and interpreted.  BNP is elevated to greater than 4500 Radiology: ordered and independent interpretation performed. Decision-making details documented in ED Course.    Details: Chest xray shows pleural effusions  ECG/medicine tests: ordered and independent interpretation performed. Decision-making details documented in ED Course. Discussion of management or test interpretation with external provider(s): I discussed pt with Hospitalist who will admit  Risk Prescription drug management.           Final Clinical Impression(s) / ED Diagnoses Final diagnoses:  Congestive heart failure, unspecified HF chronicity, unspecified heart failure type [facility])    Rx / DC Orders ED Discharge Orders     None         Osie Cheeks 07/25/23 2311    Terald Sleeper, MD 07/25/23 2318

## 2023-07-26 ENCOUNTER — Inpatient Hospital Stay (HOSPITAL_COMMUNITY): Payer: Medicare PPO

## 2023-07-26 DIAGNOSIS — E876 Hypokalemia: Secondary | ICD-10-CM | POA: Diagnosis not present

## 2023-07-26 DIAGNOSIS — J90 Pleural effusion, not elsewhere classified: Secondary | ICD-10-CM | POA: Diagnosis not present

## 2023-07-26 DIAGNOSIS — Z515 Encounter for palliative care: Secondary | ICD-10-CM | POA: Diagnosis not present

## 2023-07-26 DIAGNOSIS — I420 Dilated cardiomyopathy: Secondary | ICD-10-CM | POA: Diagnosis not present

## 2023-07-26 DIAGNOSIS — Z7189 Other specified counseling: Secondary | ICD-10-CM

## 2023-07-26 DIAGNOSIS — I509 Heart failure, unspecified: Secondary | ICD-10-CM

## 2023-07-26 DIAGNOSIS — I454 Nonspecific intraventricular block: Secondary | ICD-10-CM

## 2023-07-26 DIAGNOSIS — I5021 Acute systolic (congestive) heart failure: Secondary | ICD-10-CM | POA: Diagnosis not present

## 2023-07-26 HISTORY — PX: IR THORACENTESIS ASP PLEURAL SPACE W/IMG GUIDE: IMG5380

## 2023-07-26 LAB — CBC WITH DIFFERENTIAL/PLATELET
Abs Immature Granulocytes: 0.01 10*3/uL (ref 0.00–0.07)
Basophils Absolute: 0 10*3/uL (ref 0.0–0.1)
Basophils Relative: 1 %
Eosinophils Absolute: 0 10*3/uL (ref 0.0–0.5)
Eosinophils Relative: 1 %
HCT: 37.2 % (ref 36.0–46.0)
Hemoglobin: 12 g/dL (ref 12.0–15.0)
Immature Granulocytes: 0 %
Lymphocytes Relative: 18 %
Lymphs Abs: 1 10*3/uL (ref 0.7–4.0)
MCH: 29.2 pg (ref 26.0–34.0)
MCHC: 32.3 g/dL (ref 30.0–36.0)
MCV: 90.5 fL (ref 80.0–100.0)
Monocytes Absolute: 0.3 10*3/uL (ref 0.1–1.0)
Monocytes Relative: 6 %
Neutro Abs: 3.9 10*3/uL (ref 1.7–7.7)
Neutrophils Relative %: 74 %
Platelets: 245 10*3/uL (ref 150–400)
RBC: 4.11 MIL/uL (ref 3.87–5.11)
RDW: 15.9 % — ABNORMAL HIGH (ref 11.5–15.5)
WBC: 5.3 10*3/uL (ref 4.0–10.5)
nRBC: 0 % (ref 0.0–0.2)

## 2023-07-26 LAB — LACTATE DEHYDROGENASE, PLEURAL OR PERITONEAL FLUID: LD, Fluid: 79 U/L — ABNORMAL HIGH (ref 3–23)

## 2023-07-26 LAB — BODY FLUID CELL COUNT WITH DIFFERENTIAL
Eos, Fluid: 0 %
Lymphs, Fluid: 86 %
Monocyte-Macrophage-Serous Fluid: 13 % — ABNORMAL LOW (ref 50–90)
Neutrophil Count, Fluid: 1 % (ref 0–25)
Total Nucleated Cell Count, Fluid: 181 mm3 (ref 0–1000)

## 2023-07-26 LAB — ECHOCARDIOGRAM COMPLETE
Area-P 1/2: 1.76 cm2
Height: 66 in
MV M vel: 4.19 m/s
MV Peak grad: 70.2 mm[Hg]
S' Lateral: 3.05 cm
Weight: 1432.11 [oz_av]

## 2023-07-26 LAB — HEMOGLOBIN A1C
Hgb A1c MFr Bld: 6.9 % — ABNORMAL HIGH (ref 4.8–5.6)
Mean Plasma Glucose: 151.33 mg/dL

## 2023-07-26 LAB — GLUCOSE, CAPILLARY
Glucose-Capillary: 122 mg/dL — ABNORMAL HIGH (ref 70–99)
Glucose-Capillary: 130 mg/dL — ABNORMAL HIGH (ref 70–99)
Glucose-Capillary: 161 mg/dL — ABNORMAL HIGH (ref 70–99)
Glucose-Capillary: 84 mg/dL (ref 70–99)
Glucose-Capillary: 93 mg/dL (ref 70–99)
Glucose-Capillary: 94 mg/dL (ref 70–99)
Glucose-Capillary: 95 mg/dL (ref 70–99)

## 2023-07-26 LAB — MAGNESIUM: Magnesium: 2 mg/dL (ref 1.7–2.4)

## 2023-07-26 LAB — BASIC METABOLIC PANEL
Anion gap: 12 (ref 5–15)
BUN: 18 mg/dL (ref 8–23)
CO2: 24 mmol/L (ref 22–32)
Calcium: 8.8 mg/dL — ABNORMAL LOW (ref 8.9–10.3)
Chloride: 102 mmol/L (ref 98–111)
Creatinine, Ser: 0.88 mg/dL (ref 0.44–1.00)
GFR, Estimated: >60 mL/min (ref >60)
Glucose, Bld: 92 mg/dL (ref 70–99)
Potassium: 3.2 mmol/L — ABNORMAL LOW (ref 3.5–5.1)
Sodium: 138 mmol/L (ref 135–145)

## 2023-07-26 LAB — PROTEIN, PLEURAL OR PERITONEAL FLUID: Total protein, fluid: <3 g/dL

## 2023-07-26 LAB — LACTATE DEHYDROGENASE: LDH: 242 U/L — ABNORMAL HIGH (ref 98–192)

## 2023-07-26 LAB — GLUCOSE, PLEURAL OR PERITONEAL FLUID: Glucose, Fluid: 100 mg/dL

## 2023-07-26 MED ORDER — HYDRALAZINE HCL 20 MG/ML IJ SOLN
5.0000 mg | INTRAMUSCULAR | Status: DC | PRN
  Administered 2023-07-26 (×2): 5 mg via INTRAVENOUS
  Filled 2023-07-26 (×2): qty 1

## 2023-07-26 MED ORDER — POTASSIUM CHLORIDE CRYS ER 20 MEQ PO TBCR
40.0000 meq | EXTENDED_RELEASE_TABLET | ORAL | Status: AC
  Administered 2023-07-26 (×2): 40 meq via ORAL
  Filled 2023-07-26 (×2): qty 2

## 2023-07-26 MED ORDER — LIDOCAINE HCL 1 % IJ SOLN
INTRAMUSCULAR | Status: AC
  Filled 2023-07-26: qty 20

## 2023-07-26 MED ORDER — ENSURE ENLIVE PO LIQD
237.0000 mL | Freq: Two times a day (BID) | ORAL | Status: DC
  Administered 2023-07-26 – 2023-07-27 (×2): 237 mL via ORAL

## 2023-07-26 NOTE — Procedures (Signed)
PROCEDURE SUMMARY:  Successful US guided left thoracentesis. Yielded 400 mL of clear yellow fluid. Patient tolerated procedure well. No immediate complications. EBL = trace  Specimen was sent for labs.  Post procedure chest X-ray pending.  Jerry Caras Hymie Gorr PA-C 07/26/2023 3:51 PM

## 2023-07-26 NOTE — Plan of Care (Signed)
Patient alert to self only non verbal total care needed on room air feeder. Plan to have thora today. Problem: Education: Goal: Ability to describe self-care measures that may prevent or decrease complications (Diabetes Survival Skills Education) will improve Outcome: Not Progressing Goal: Individualized Educational Video(s) Outcome: Not Progressing   Problem: Coping: Goal: Ability to adjust to condition or change in health will improve Outcome: Not Progressing   Problem: Fluid Volume: Goal: Ability to maintain a balanced intake and output will improve Outcome: Not Progressing   Problem: Health Behavior/Discharge Planning: Goal: Ability to identify and utilize available resources and services will improve Outcome: Not Progressing Goal: Ability to manage health-related needs will improve Outcome: Not Progressing   Problem: Metabolic: Goal: Ability to maintain appropriate glucose levels will improve Outcome: Not Progressing   Problem: Nutritional: Goal: Maintenance of adequate nutrition will improve Outcome: Not Progressing Goal: Progress toward achieving an optimal weight will improve Outcome: Not Progressing   Problem: Skin Integrity: Goal: Risk for impaired skin integrity will decrease Outcome: Not Progressing   Problem: Tissue Perfusion: Goal: Adequacy of tissue perfusion will improve Outcome: Not Progressing   Problem: Education: Goal: Knowledge of General Education information will improve Description: Including pain rating scale, medication(s)/side effects and non-pharmacologic comfort measures Outcome: Not Progressing   Problem: Health Behavior/Discharge Planning: Goal: Ability to manage health-related needs will improve Outcome: Not Progressing   Problem: Clinical Measurements: Goal: Ability to maintain clinical measurements within normal limits will improve Outcome: Not Progressing Goal: Will remain free from infection Outcome: Not Progressing Goal:  Diagnostic test results will improve Outcome: Not Progressing Goal: Respiratory complications will improve Outcome: Not Progressing Goal: Cardiovascular complication will be avoided Outcome: Not Progressing   Problem: Activity: Goal: Risk for activity intolerance will decrease Outcome: Not Progressing   Problem: Nutrition: Goal: Adequate nutrition will be maintained Outcome: Not Progressing   Problem: Coping: Goal: Level of anxiety will decrease Outcome: Not Progressing   Problem: Pain Managment: Goal: General experience of comfort will improve Outcome: Not Progressing   Problem: Safety: Goal: Ability to remain free from injury will improve Outcome: Not Progressing   Problem: Skin Integrity: Goal: Risk for impaired skin integrity will decrease Outcome: Not Progressing   Problem: Education: Goal: Ability to demonstrate management of disease process will improve Outcome: Not Progressing Goal: Ability to verbalize understanding of medication therapies will improve Outcome: Not Progressing Goal: Individualized Educational Video(s) Outcome: Not Progressing   Problem: Activity: Goal: Capacity to carry out activities will improve Outcome: Not Progressing   Problem: Cardiac: Goal: Ability to achieve and maintain adequate cardiopulmonary perfusion will improve Outcome: Not Progressing

## 2023-07-26 NOTE — Consult Note (Signed)
Palliative Care Consult Note                                  Date: 07/26/2023   Patient Name: Alicia Vang  DOB: Apr 30, 1927  MRN: [id number]  Age / Sex: 89 y.o., female  PCP: Knox Royalty, MD Referring Physician: Glade Lloyd, MD  Reason for Consultation: Establishing goals of care  HPI/Patient Profile: 89 y.o. female  with past medical history of hypertension, Parkinson's disease, and fall with hip surgery (2023) admitted on 07/25/2023 with worsening shortness of breath and lower extremity swelling.   On presentation, BNP was more than 4500. Chest x-ray showed bilateral moderate pleural effusions.  Past Medical History:  Diagnosis Date   Arthritis    Diabetes mellitus without complication (HCC)    Type II   Hypertension    Hypokalemia     Subjective:   I have reviewed medical records including EPIC notes, labs and imaging, assessed the patient and then met with her daughter Alicia Vang at the bedside to discuss diagnosis prognosis, GOC, EOL wishes, disposition and options.  I introduced Palliative Medicine as specialized medical care for people living with serious illness. It focuses on providing relief from symptoms and stress of a serious illness. The goal is to improve quality of life for both the patient and the family.  Today's Discussion: Patient's daughter Alicia Vang has a good understanding of the patient's chronic conditions and acute hospitalization. Alicia Vang is her mother's caregiver and one of the decision makers. The patient lives with Alicia Vang but has three other children. She is from [place], [place].   Up until a year ago the patient was completing most ADLs independently. She had a fall which fractured her hip. Since then she has been more dependent on her daughter. The patient's mental status is variable but she is often still able to communicate with her children. The patient continues to have a good appetite but does  have trouble with aspiration at times. Her daughter has her house set up to accommodate the patient's needs including lifts, sara steady, and stair lift. She works with a group through her church that helps her with caregiver needs.  A discussion was had today regarding advanced directives. Concepts specific to code status, artifical feeding and hydration, and rehospitalization was had.  Recommended consideration of DNR status, understanding evidenced-based poor outcomes in similar hospitalized patients, as the cause of the arrest is likely associated with chronic/terminal disease rather than a reversible acute cardio-pulmonary event. Patient's daughter confirms family wants patient to be a FULL code and FULL scope. I encouraged the patient's daughter to look at the patient's living will to confirm this would be her wish.   We discussed the patient's swallow evaluation and I encouraged the family to begin thinking of what interventions the patient may or may not want in the future.   Patient's family face treatment option decisions, advanced directive decisions, and anticipatory care needs. Discussed the importance of continued conversation with family and the medical providers regarding overall plan of care and treatment options, ensuring decisions are within the context of the patient's values and GOCs.  Questions and  concerns were addressed. Hard Choices booklet left for review. The family was encouraged to call with questions or concerns. PMT will continue to support holistically.  Review of Systems  Unable to perform ROS   Objective:   Primary Diagnoses: Present on Admission:  Essential hypertension  Chronic kidney disease, stage 3a (HCC)  Parkinson's disease (HCC)   Physical Exam Vitals reviewed.  Constitutional:      General: She is sleeping.  HENT:     Head: Normocephalic and atraumatic.  Cardiovascular:     Rate and Rhythm: Bradycardia present.  Pulmonary:     Effort:  Pulmonary effort is normal.     Vital Signs:  BP (!) 147/96 (BP Location: Right Arm)   Pulse (!) 53   Temp 98 F (36.7 C) (Oral)   Resp 20   Ht 5\' 6"  (1.676 m)   Wt 40.6 kg   SpO2 99%   BMI 14.45 kg/m   Palliative Assessment/Data: 40%    Advanced Care Planning:   Existing Vynca/ACP Documentation: None  Primary Decision Maker: NEXT OF KIN/ HCPOA  Code Status/Advance Care Planning: Full code  Assessment & Plan:   SUMMARY OF RECOMMENDATIONS   Full code Full scope Encouraged family to discuss goals of care PMT will follow up   Discussed with: Dr. Hanley Ben  Time spent: 75 minutes  Thank you for allowing [place] to participate in the care of Alicia Vang PMT will continue to support holistically.   Signed by: Alicia Ser, NP Palliative Medicine Team  Team Phone # [phone] (Nights/Weekends)  07/26/2023, 3:31 PM

## 2023-07-26 NOTE — Progress Notes (Signed)
Patient hold food in mouth and starts to choke at times she will take a bite and swallow but 90% time she will not.

## 2023-07-26 NOTE — Progress Notes (Signed)
PROGRESS NOTE    JAMINE WINGATE  NWG:[id number] DOB: 05-08-1927 DOA: 07/25/2023 PCP: Knox Royalty, MD   Brief Narrative:  89 year old female with past medical history of hypertension, Parkinson's disease, mostly bedbound since hip surgery approximately a year ago presented with worsening shortness of breath and lower extremity swelling.  On presentation, BNP was more than 4500.  COVID 19/influenza/RSV PCR negative.  Chest x-ray showed bilateral moderate pleural effusions.  She was started on IV Lasix.  Assessment & Plan:   Possible acute congestive heart failure with moderate bilateral pleural effusions  Essential hypertension -Unclear if this is systolic or diastolic.  Awaiting echo. -Continue IV Lasix.  Strict input and output.  Daily weights.  Fluid restriction.  Cardiology evaluation.-Continue metoprolol and losartan. -Left-sided thoracentesis has been ordered   Hypokalemia -Replace.  Repeat a.m. labs  Dysphagia -Currently NPO.  SLP evaluation  Diabetes mellitus type 2 with hyperglycemia - A1c 6.9.  Continue CBGs with SSI.  Prolonged Qtc -QTc 526 on presentation.  Repeat a.m. EKG.  Replace electrolytes.  Physical deconditioning Goals of care -Patient apparently has been bedbound for almost a year after hip surgery.  PT eval.  Fall precautions. -Discussed with daughter at bedside and recommended her to consider changing the patient to DNR.  Daughter is not ready for it yet.  Palliative care consultation for goals of care discussion.   DVT prophylaxis: Start Lovenox Code Status: Full Family Communication: Daughter at bedside Disposition Plan: Status is: Inpatient Remains inpatient appropriate because: Of severity of illness  Consultants: Cardiology/palliative care  Procedures: None  Antimicrobials: None   Subjective: Patient seen and examined at bedside.  Sleepy, wakes up slightly.  No chest pain, fever or vomiting reported.  Daughter present at  bedside.  Objective: Vitals:   07/26/23 0047 07/26/23 0249 07/26/23 0400 07/26/23 0752  BP: (!) 181/90 (!) 145/76 118/64 115/83  Pulse:   (!) 51 (!) 51  Resp:  18 14 14   Temp: 97.8 F (36.6 C)  97.7 F (36.5 C) 98 F (36.7 C)  TempSrc: Oral  Oral Oral  SpO2:   100% 92%  Weight: 40.9 kg  40.6 kg   Height: 5\' 6"  (1.676 m)       Intake/Output Summary (Last 24 hours) at 07/26/2023 0920 Last data filed at 07/26/2023 0700 Gross per 24 hour  Intake --  Output 3250 ml  Net -3250 ml   Filed Weights   07/25/23 1608 07/26/23 0047 07/26/23 0400  Weight: 39.5 kg 40.9 kg 40.6 kg    Examination:  General exam: Appears calm and comfortable.  Elderly female lying in bed.  On room air.  Looks chronically ill and deconditioned. Respiratory system: Bilateral decreased breath sounds at bases with scattered crackles Cardiovascular system: S1 & S2 heard, mildly bradycardic intermittently  gastrointestinal system: Abdomen is nondistended, soft and nontender. Normal bowel sounds heard. Extremities: No cyanosis, clubbing; bilateral feet edema present Central nervous system: Sleepy, wakes up slightly, extremely slow to respond.  Poor historian.  No obvious focal deficits noted. Skin: No rashes, lesions or ulcers Psychiatry: Flat affect.  Not agitated.   Data Reviewed: I have personally reviewed following labs and imaging studies  CBC: Recent Labs  Lab 07/25/23 1621 07/26/23 0320  WBC 5.2 5.3  NEUTROABS  --  3.9  HGB 11.2* 12.0  HCT 35.8* 37.2  MCV 95.2 90.5  PLT 240 245   Basic Metabolic Panel: Recent Labs  Lab 07/25/23 1621 07/26/23 0320  NA 136 138  K 3.1* 3.2*  PROGRESS NOTE    JAMINE WINGATE  NWG:[id number] DOB: 05-08-1927 DOA: 07/25/2023 PCP: Knox Royalty, MD   Brief Narrative:  89 year old female with past medical history of hypertension, Parkinson's disease, mostly bedbound since hip surgery approximately a year ago presented with worsening shortness of breath and lower extremity swelling.  On presentation, BNP was more than 4500.  COVID 19/influenza/RSV PCR negative.  Chest x-ray showed bilateral moderate pleural effusions.  She was started on IV Lasix.  Assessment & Plan:   Possible acute congestive heart failure with moderate bilateral pleural effusions  Essential hypertension -Unclear if this is systolic or diastolic.  Awaiting echo. -Continue IV Lasix.  Strict input and output.  Daily weights.  Fluid restriction.  Cardiology evaluation.-Continue metoprolol and losartan. -Left-sided thoracentesis has been ordered   Hypokalemia -Replace.  Repeat a.m. labs  Dysphagia -Currently NPO.  SLP evaluation  Diabetes mellitus type 2 with hyperglycemia - A1c 6.9.  Continue CBGs with SSI.  Prolonged Qtc -QTc 526 on presentation.  Repeat a.m. EKG.  Replace electrolytes.  Physical deconditioning Goals of care -Patient apparently has been bedbound for almost a year after hip surgery.  PT eval.  Fall precautions. -Discussed with daughter at bedside and recommended her to consider changing the patient to DNR.  Daughter is not ready for it yet.  Palliative care consultation for goals of care discussion.   DVT prophylaxis: Start Lovenox Code Status: Full Family Communication: Daughter at bedside Disposition Plan: Status is: Inpatient Remains inpatient appropriate because: Of severity of illness  Consultants: Cardiology/palliative care  Procedures: None  Antimicrobials: None   Subjective: Patient seen and examined at bedside.  Sleepy, wakes up slightly.  No chest pain, fever or vomiting reported.  Daughter present at  bedside.  Objective: Vitals:   07/26/23 0047 07/26/23 0249 07/26/23 0400 07/26/23 0752  BP: (!) 181/90 (!) 145/76 118/64 115/83  Pulse:   (!) 51 (!) 51  Resp:  18 14 14   Temp: 97.8 F (36.6 C)  97.7 F (36.5 C) 98 F (36.7 C)  TempSrc: Oral  Oral Oral  SpO2:   100% 92%  Weight: 40.9 kg  40.6 kg   Height: 5\' 6"  (1.676 m)       Intake/Output Summary (Last 24 hours) at 07/26/2023 0920 Last data filed at 07/26/2023 0700 Gross per 24 hour  Intake --  Output 3250 ml  Net -3250 ml   Filed Weights   07/25/23 1608 07/26/23 0047 07/26/23 0400  Weight: 39.5 kg 40.9 kg 40.6 kg    Examination:  General exam: Appears calm and comfortable.  Elderly female lying in bed.  On room air.  Looks chronically ill and deconditioned. Respiratory system: Bilateral decreased breath sounds at bases with scattered crackles Cardiovascular system: S1 & S2 heard, mildly bradycardic intermittently  gastrointestinal system: Abdomen is nondistended, soft and nontender. Normal bowel sounds heard. Extremities: No cyanosis, clubbing; bilateral feet edema present Central nervous system: Sleepy, wakes up slightly, extremely slow to respond.  Poor historian.  No obvious focal deficits noted. Skin: No rashes, lesions or ulcers Psychiatry: Flat affect.  Not agitated.   Data Reviewed: I have personally reviewed following labs and imaging studies  CBC: Recent Labs  Lab 07/25/23 1621 07/26/23 0320  WBC 5.2 5.3  NEUTROABS  --  3.9  HGB 11.2* 12.0  HCT 35.8* 37.2  MCV 95.2 90.5  PLT 240 245   Basic Metabolic Panel: Recent Labs  Lab 07/25/23 1621 07/26/23 0320  NA 136 138  K 3.1* 3.2*  PROGRESS NOTE    JAMINE WINGATE  NWG:[id number] DOB: 05-08-1927 DOA: 07/25/2023 PCP: Knox Royalty, MD   Brief Narrative:  89 year old female with past medical history of hypertension, Parkinson's disease, mostly bedbound since hip surgery approximately a year ago presented with worsening shortness of breath and lower extremity swelling.  On presentation, BNP was more than 4500.  COVID 19/influenza/RSV PCR negative.  Chest x-ray showed bilateral moderate pleural effusions.  She was started on IV Lasix.  Assessment & Plan:   Possible acute congestive heart failure with moderate bilateral pleural effusions  Essential hypertension -Unclear if this is systolic or diastolic.  Awaiting echo. -Continue IV Lasix.  Strict input and output.  Daily weights.  Fluid restriction.  Cardiology evaluation.-Continue metoprolol and losartan. -Left-sided thoracentesis has been ordered   Hypokalemia -Replace.  Repeat a.m. labs  Dysphagia -Currently NPO.  SLP evaluation  Diabetes mellitus type 2 with hyperglycemia - A1c 6.9.  Continue CBGs with SSI.  Prolonged Qtc -QTc 526 on presentation.  Repeat a.m. EKG.  Replace electrolytes.  Physical deconditioning Goals of care -Patient apparently has been bedbound for almost a year after hip surgery.  PT eval.  Fall precautions. -Discussed with daughter at bedside and recommended her to consider changing the patient to DNR.  Daughter is not ready for it yet.  Palliative care consultation for goals of care discussion.   DVT prophylaxis: Start Lovenox Code Status: Full Family Communication: Daughter at bedside Disposition Plan: Status is: Inpatient Remains inpatient appropriate because: Of severity of illness  Consultants: Cardiology/palliative care  Procedures: None  Antimicrobials: None   Subjective: Patient seen and examined at bedside.  Sleepy, wakes up slightly.  No chest pain, fever or vomiting reported.  Daughter present at  bedside.  Objective: Vitals:   07/26/23 0047 07/26/23 0249 07/26/23 0400 07/26/23 0752  BP: (!) 181/90 (!) 145/76 118/64 115/83  Pulse:   (!) 51 (!) 51  Resp:  18 14 14   Temp: 97.8 F (36.6 C)  97.7 F (36.5 C) 98 F (36.7 C)  TempSrc: Oral  Oral Oral  SpO2:   100% 92%  Weight: 40.9 kg  40.6 kg   Height: 5\' 6"  (1.676 m)       Intake/Output Summary (Last 24 hours) at 07/26/2023 0920 Last data filed at 07/26/2023 0700 Gross per 24 hour  Intake --  Output 3250 ml  Net -3250 ml   Filed Weights   07/25/23 1608 07/26/23 0047 07/26/23 0400  Weight: 39.5 kg 40.9 kg 40.6 kg    Examination:  General exam: Appears calm and comfortable.  Elderly female lying in bed.  On room air.  Looks chronically ill and deconditioned. Respiratory system: Bilateral decreased breath sounds at bases with scattered crackles Cardiovascular system: S1 & S2 heard, mildly bradycardic intermittently  gastrointestinal system: Abdomen is nondistended, soft and nontender. Normal bowel sounds heard. Extremities: No cyanosis, clubbing; bilateral feet edema present Central nervous system: Sleepy, wakes up slightly, extremely slow to respond.  Poor historian.  No obvious focal deficits noted. Skin: No rashes, lesions or ulcers Psychiatry: Flat affect.  Not agitated.   Data Reviewed: I have personally reviewed following labs and imaging studies  CBC: Recent Labs  Lab 07/25/23 1621 07/26/23 0320  WBC 5.2 5.3  NEUTROABS  --  3.9  HGB 11.2* 12.0  HCT 35.8* 37.2  MCV 95.2 90.5  PLT 240 245   Basic Metabolic Panel: Recent Labs  Lab 07/25/23 1621 07/26/23 0320  NA 136 138  K 3.1* 3.2*  limits. Aortic atherosclerosis. IMPRESSION: Moderate bilateral pleural effusions with bibasilar atelectasis. Electronically Signed   By: Charlett Nose M.D.   On: 07/25/2023 19:12   VAS [place] LOWER EXTREMITY VENOUS (DVT) (7a-7p)  Result Date: 07/25/2023  Lower Venous DVT Study Patient Name:  ATONYA TEMPLER  Date of Exam:   07/25/2023 Medical Rec #: [id number]            Accession #:    [id number] Date of Birth: [birth year]           Patient Gender: F Patient Age:   8 years Exam Location:  [facility]  Procedure:      VAS [place] LOWER EXTREMITY VENOUS (DVT) Referring Phys: Griffin Basil ROEMHILDT --------------------------------------------------------------------------------  Indications: Swelling.  Risk Factors: None identified. Limitations: Poor ultrasound/tissue interface and patient positioning. Comparison Study: No prior studies. Performing Technologist: Chanda Busing RVT  Examination Guidelines: A complete evaluation includes B-mode imaging, spectral Doppler, color Doppler, and power Doppler as needed of all accessible portions of each vessel. Bilateral testing is considered an integral part of a complete examination. Limited examinations for reoccurring indications may be performed as noted. The reflux portion of the exam is performed with the patient in reverse Trendelenburg.  +---------+---------------+---------+-----------+----------+--------------+ RIGHT    CompressibilityPhasicitySpontaneityPropertiesThrombus Aging +---------+---------------+---------+-----------+----------+--------------+ CFV      Full           Yes      No                                  +---------+---------------+---------+-----------+----------+--------------+ SFJ      Full                                                        +---------+---------------+---------+-----------+----------+--------------+ FV Prox  Full                                                        +---------+---------------+---------+-----------+----------+--------------+ FV Mid   Full                                                        +---------+---------------+---------+-----------+----------+--------------+ FV DistalFull                                                        +---------+---------------+---------+-----------+----------+--------------+ PFV      Full                                                        +---------+---------------+---------+-----------+----------+--------------+ POP      Full            Yes

## 2023-07-26 NOTE — ED Notes (Signed)
Potassium tablets and eye drops were not given, patient went upstairs prior to administration. MAR was edited to reflect the change as not given and hold. Please give when settled upstairs.

## 2023-07-26 NOTE — Progress Notes (Signed)
  Echocardiogram 2D Echocardiogram has been performed.  Milda Smart 07/26/2023, 2:21 PM

## 2023-07-26 NOTE — Consult Note (Signed)
Cardiology Consultation   Patient ID: Alicia Vang MRN: [id number]; DOB: [birth year]  Admit date: 07/25/2023 Date of Consult: 07/26/2023  PCP:  Knox Royalty, MD   [facility] Providers Cardiologist:  None        Patient Profile:   Alicia Vang is a 89 y.o. female with a hx of hypertension, Parkinson disease who is being seen 07/26/2023 for the evaluation of CHF at the request of Dr. Hanley Ben.  History of Present Illness:   Ms. Haseley presented to the hospital for evaluation of acute dyspnea and hypertension. Patient lives with her daughter and was noted to have increased work of breathing approximately 2 weeks ago. She reached out to patient's PCP and was advised to monitor O2. O2 remained stable until 10/1 when it was noted that patient appeared to have acute exacerbation of dyspnea/acute respiratory distress. BP at this time reportedly elevated over 200 systolic. In the ED, labs notable for BNP >4500, HGB 11.2. Albumin 3.2, total protein 6.1. No leukocytosis. COVID and influenza negative. CXR revealed moderate bilateral effusions. Patient admitted and treated for acute CHF with IV lasix q12 hours. A left thoracentesis has been ordered, not yet completed.   Upon discussion with patient and daughter today, patient has been mostly bed bound for nearly a year after hip surgery. Prior to this, she was completing ADLs mostly independent of help. Her daughter takes amazing care of patient, helps move her from bed to wheelchair and also downstairs and upstairs via lift device. Patient on exam is non-verbal but makes appropriate eye contact. Daughter states that her mom has not talked much outside of short phrases/single word responses over the last year. Daughter confirms above history that patient began to develop increasing BP, hyperglycemia, and increased work of breathing over the last few weeks. On the day of presentation to the ED, patient was supposed to be seen by  PCP but because patient was so acutely short of breath, ED evaluation recommended.    Past Medical History:  Diagnosis Date   Arthritis    Diabetes mellitus without complication (HCC)    Type II   Hypertension    Hypokalemia     Past Surgical History:  Procedure Laterality Date   ABDOMINAL HYSTERECTOMY     CHALAZION EXCISION  11/06/2011   Procedure: MINOR EXCISION OF CHALAZION;  Surgeon: Vita Erm.;  Location: [facility];  Service: Ophthalmology;  Laterality: Left;   CHALAZION EXCISION  02/02/2012   Procedure: MINOR EXCISION OF CHALAZION;  Surgeon: Vita Erm., MD;  Location: [facility];  Service: Ophthalmology;  Laterality: Left;  left eye upper lid   CHALAZION EXCISION Right 03/28/2013   Procedure: MINOR EXCISION OF CHALAZION upper and lower right eye ;  Surgeon: Vita Erm., MD;  Location: [facility];  Service: Ophthalmology;  Laterality: Right;   CHALAZION EXCISION Right 03/28/2013   Procedure: MINOR EXCISION OF CHALAZION UPPER AND LOWER RIGHT EYE  ;  Surgeon: Vita Erm., MD;  Location: [facility] OR;  Service: Ophthalmology;  Laterality: Right;   COLONOSCOPY     HIP PINNING,CANNULATED Right 07/02/2022   Procedure: PERCUTANEOUS SCREW FIXATION OF HIP;  Surgeon: Jones Broom, MD;  Location: [facility] OR;  Service: Orthopedics;  Laterality: Right;   TOTAL HIP ARTHROPLASTY Left 10/26/2017   Procedure: TOTAL HIP ARTHROPLASTY ANTERIOR APPROACH;  Surgeon: Gean Birchwood, MD;  Location: [facility] OR;  Service: Orthopedics;  Laterality: Left;  High Sensitivity Troponin:  No results for input(s): "TROPONINIHS" in the last 720 hours.   Chemistry Recent Labs  Lab 07/25/23 1621 07/26/23 0320  NA 136 138  K 3.1* 3.2*  CL 103 102  CO2 25 24  GLUCOSE 255* 92  BUN 19 18  CREATININE 1.06* 0.88  CALCIUM 8.3* 8.8*  MG 2.0 2.0  GFRNONAA 49* >60  ANIONGAP 8 12    Recent Labs  Lab 07/25/23 1621  PROT 6.1*  ALBUMIN 3.2*  AST 31  ALT 45*  ALKPHOS 76  BILITOT 0.9   Lipids No results for input(s): "CHOL", "TRIG", "HDL", "LABVLDL", "LDLCALC", "CHOLHDL" in the last 168 hours.  Hematology Recent Labs  Lab 07/25/23 1621 07/26/23 0320  WBC 5.2 5.3  RBC 3.76* 4.11  HGB 11.2* 12.0  HCT 35.8* 37.2  MCV 95.2 90.5  MCH 29.8 29.2  MCHC 31.3 32.3  RDW 16.0* 15.9*  PLT 240 245   Thyroid No results for input(s): "TSH", "FREET4" in the last 168 hours.  BNP Recent Labs  Lab 07/25/23 2108  BNP >4,500.0*    DDimer No results for input(s): "DDIMER" in the last 168 hours.   Radiology/Studies:  DG Chest Portable 1 View  Result Date: 07/25/2023 CLINICAL DATA:  Shortness of breath, hypoxia EXAM: PORTABLE CHEST 1 VIEW COMPARISON:  07/01/2022 FINDINGS: Moderate bilateral pleural effusions. Bibasilar atelectasis. Heart mediastinal contours within normal limits. Aortic atherosclerosis. IMPRESSION: Moderate bilateral pleural effusions with bibasilar atelectasis. Electronically Signed   By: Charlett Nose M.D.   On: 07/25/2023 19:12   VAS [place] LOWER EXTREMITY VENOUS (DVT) (7a-7p)  Result Date: 07/25/2023  Lower Venous DVT Study Patient Name:  Alicia Vang  Date of Exam:   07/25/2023 Medical Rec #: [id number]            Accession #:    [id number] Date of Birth: [birth year]           Patient Gender: F Patient Age:   63 years Exam Location:  [facility] Procedure:      VAS [place] LOWER EXTREMITY VENOUS (DVT) Referring Phys: Griffin Basil ROEMHILDT  --------------------------------------------------------------------------------  Indications: Swelling.  Risk Factors: None identified. Limitations: Poor ultrasound/tissue interface and patient positioning. Comparison Study: No prior studies. Performing Technologist: Chanda Busing RVT  Examination Guidelines: A complete evaluation includes B-mode imaging, spectral Doppler, color Doppler, and power Doppler as needed of all accessible portions of each vessel. Bilateral testing is considered an integral part of a complete examination. Limited examinations for reoccurring indications may be performed as noted. The reflux portion of the exam is performed with the patient in reverse Trendelenburg.  +---------+---------------+---------+-----------+----------+--------------+ RIGHT    CompressibilityPhasicitySpontaneityPropertiesThrombus Aging +---------+---------------+---------+-----------+----------+--------------+ CFV      Full           Yes      No                                  +---------+---------------+---------+-----------+----------+--------------+ SFJ      Full                                                        +---------+---------------+---------+-----------+----------+--------------+ FV Prox  Full                                                        +---------+---------------+---------+-----------+----------+--------------+  High Sensitivity Troponin:  No results for input(s): "TROPONINIHS" in the last 720 hours.   Chemistry Recent Labs  Lab 07/25/23 1621 07/26/23 0320  NA 136 138  K 3.1* 3.2*  CL 103 102  CO2 25 24  GLUCOSE 255* 92  BUN 19 18  CREATININE 1.06* 0.88  CALCIUM 8.3* 8.8*  MG 2.0 2.0  GFRNONAA 49* >60  ANIONGAP 8 12    Recent Labs  Lab 07/25/23 1621  PROT 6.1*  ALBUMIN 3.2*  AST 31  ALT 45*  ALKPHOS 76  BILITOT 0.9   Lipids No results for input(s): "CHOL", "TRIG", "HDL", "LABVLDL", "LDLCALC", "CHOLHDL" in the last 168 hours.  Hematology Recent Labs  Lab 07/25/23 1621 07/26/23 0320  WBC 5.2 5.3  RBC 3.76* 4.11  HGB 11.2* 12.0  HCT 35.8* 37.2  MCV 95.2 90.5  MCH 29.8 29.2  MCHC 31.3 32.3  RDW 16.0* 15.9*  PLT 240 245   Thyroid No results for input(s): "TSH", "FREET4" in the last 168 hours.  BNP Recent Labs  Lab 07/25/23 2108  BNP >4,500.0*    DDimer No results for input(s): "DDIMER" in the last 168 hours.   Radiology/Studies:  DG Chest Portable 1 View  Result Date: 07/25/2023 CLINICAL DATA:  Shortness of breath, hypoxia EXAM: PORTABLE CHEST 1 VIEW COMPARISON:  07/01/2022 FINDINGS: Moderate bilateral pleural effusions. Bibasilar atelectasis. Heart mediastinal contours within normal limits. Aortic atherosclerosis. IMPRESSION: Moderate bilateral pleural effusions with bibasilar atelectasis. Electronically Signed   By: Charlett Nose M.D.   On: 07/25/2023 19:12   VAS [place] LOWER EXTREMITY VENOUS (DVT) (7a-7p)  Result Date: 07/25/2023  Lower Venous DVT Study Patient Name:  Alicia Vang  Date of Exam:   07/25/2023 Medical Rec #: [id number]            Accession #:    [id number] Date of Birth: [birth year]           Patient Gender: F Patient Age:   63 years Exam Location:  [facility] Procedure:      VAS [place] LOWER EXTREMITY VENOUS (DVT) Referring Phys: Griffin Basil ROEMHILDT  --------------------------------------------------------------------------------  Indications: Swelling.  Risk Factors: None identified. Limitations: Poor ultrasound/tissue interface and patient positioning. Comparison Study: No prior studies. Performing Technologist: Chanda Busing RVT  Examination Guidelines: A complete evaluation includes B-mode imaging, spectral Doppler, color Doppler, and power Doppler as needed of all accessible portions of each vessel. Bilateral testing is considered an integral part of a complete examination. Limited examinations for reoccurring indications may be performed as noted. The reflux portion of the exam is performed with the patient in reverse Trendelenburg.  +---------+---------------+---------+-----------+----------+--------------+ RIGHT    CompressibilityPhasicitySpontaneityPropertiesThrombus Aging +---------+---------------+---------+-----------+----------+--------------+ CFV      Full           Yes      No                                  +---------+---------------+---------+-----------+----------+--------------+ SFJ      Full                                                        +---------+---------------+---------+-----------+----------+--------------+ FV Prox  Full                                                        +---------+---------------+---------+-----------+----------+--------------+  High Sensitivity Troponin:  No results for input(s): "TROPONINIHS" in the last 720 hours.   Chemistry Recent Labs  Lab 07/25/23 1621 07/26/23 0320  NA 136 138  K 3.1* 3.2*  CL 103 102  CO2 25 24  GLUCOSE 255* 92  BUN 19 18  CREATININE 1.06* 0.88  CALCIUM 8.3* 8.8*  MG 2.0 2.0  GFRNONAA 49* >60  ANIONGAP 8 12    Recent Labs  Lab 07/25/23 1621  PROT 6.1*  ALBUMIN 3.2*  AST 31  ALT 45*  ALKPHOS 76  BILITOT 0.9   Lipids No results for input(s): "CHOL", "TRIG", "HDL", "LABVLDL", "LDLCALC", "CHOLHDL" in the last 168 hours.  Hematology Recent Labs  Lab 07/25/23 1621 07/26/23 0320  WBC 5.2 5.3  RBC 3.76* 4.11  HGB 11.2* 12.0  HCT 35.8* 37.2  MCV 95.2 90.5  MCH 29.8 29.2  MCHC 31.3 32.3  RDW 16.0* 15.9*  PLT 240 245   Thyroid No results for input(s): "TSH", "FREET4" in the last 168 hours.  BNP Recent Labs  Lab 07/25/23 2108  BNP >4,500.0*    DDimer No results for input(s): "DDIMER" in the last 168 hours.   Radiology/Studies:  DG Chest Portable 1 View  Result Date: 07/25/2023 CLINICAL DATA:  Shortness of breath, hypoxia EXAM: PORTABLE CHEST 1 VIEW COMPARISON:  07/01/2022 FINDINGS: Moderate bilateral pleural effusions. Bibasilar atelectasis. Heart mediastinal contours within normal limits. Aortic atherosclerosis. IMPRESSION: Moderate bilateral pleural effusions with bibasilar atelectasis. Electronically Signed   By: Charlett Nose M.D.   On: 07/25/2023 19:12   VAS [place] LOWER EXTREMITY VENOUS (DVT) (7a-7p)  Result Date: 07/25/2023  Lower Venous DVT Study Patient Name:  Alicia Vang  Date of Exam:   07/25/2023 Medical Rec #: [id number]            Accession #:    [id number] Date of Birth: [birth year]           Patient Gender: F Patient Age:   63 years Exam Location:  [facility] Procedure:      VAS [place] LOWER EXTREMITY VENOUS (DVT) Referring Phys: Griffin Basil ROEMHILDT  --------------------------------------------------------------------------------  Indications: Swelling.  Risk Factors: None identified. Limitations: Poor ultrasound/tissue interface and patient positioning. Comparison Study: No prior studies. Performing Technologist: Chanda Busing RVT  Examination Guidelines: A complete evaluation includes B-mode imaging, spectral Doppler, color Doppler, and power Doppler as needed of all accessible portions of each vessel. Bilateral testing is considered an integral part of a complete examination. Limited examinations for reoccurring indications may be performed as noted. The reflux portion of the exam is performed with the patient in reverse Trendelenburg.  +---------+---------------+---------+-----------+----------+--------------+ RIGHT    CompressibilityPhasicitySpontaneityPropertiesThrombus Aging +---------+---------------+---------+-----------+----------+--------------+ CFV      Full           Yes      No                                  +---------+---------------+---------+-----------+----------+--------------+ SFJ      Full                                                        +---------+---------------+---------+-----------+----------+--------------+ FV Prox  Full                                                        +---------+---------------+---------+-----------+----------+--------------+  Cardiology Consultation   Patient ID: Alicia Vang MRN: [id number]; DOB: [birth year]  Admit date: 07/25/2023 Date of Consult: 07/26/2023  PCP:  Knox Royalty, MD   [facility] Providers Cardiologist:  None        Patient Profile:   Alicia Vang is a 89 y.o. female with a hx of hypertension, Parkinson disease who is being seen 07/26/2023 for the evaluation of CHF at the request of Dr. Hanley Ben.  History of Present Illness:   Ms. Haseley presented to the hospital for evaluation of acute dyspnea and hypertension. Patient lives with her daughter and was noted to have increased work of breathing approximately 2 weeks ago. She reached out to patient's PCP and was advised to monitor O2. O2 remained stable until 10/1 when it was noted that patient appeared to have acute exacerbation of dyspnea/acute respiratory distress. BP at this time reportedly elevated over 200 systolic. In the ED, labs notable for BNP >4500, HGB 11.2. Albumin 3.2, total protein 6.1. No leukocytosis. COVID and influenza negative. CXR revealed moderate bilateral effusions. Patient admitted and treated for acute CHF with IV lasix q12 hours. A left thoracentesis has been ordered, not yet completed.   Upon discussion with patient and daughter today, patient has been mostly bed bound for nearly a year after hip surgery. Prior to this, she was completing ADLs mostly independent of help. Her daughter takes amazing care of patient, helps move her from bed to wheelchair and also downstairs and upstairs via lift device. Patient on exam is non-verbal but makes appropriate eye contact. Daughter states that her mom has not talked much outside of short phrases/single word responses over the last year. Daughter confirms above history that patient began to develop increasing BP, hyperglycemia, and increased work of breathing over the last few weeks. On the day of presentation to the ED, patient was supposed to be seen by  PCP but because patient was so acutely short of breath, ED evaluation recommended.    Past Medical History:  Diagnosis Date   Arthritis    Diabetes mellitus without complication (HCC)    Type II   Hypertension    Hypokalemia     Past Surgical History:  Procedure Laterality Date   ABDOMINAL HYSTERECTOMY     CHALAZION EXCISION  11/06/2011   Procedure: MINOR EXCISION OF CHALAZION;  Surgeon: Vita Erm.;  Location: [facility];  Service: Ophthalmology;  Laterality: Left;   CHALAZION EXCISION  02/02/2012   Procedure: MINOR EXCISION OF CHALAZION;  Surgeon: Vita Erm., MD;  Location: [facility];  Service: Ophthalmology;  Laterality: Left;  left eye upper lid   CHALAZION EXCISION Right 03/28/2013   Procedure: MINOR EXCISION OF CHALAZION upper and lower right eye ;  Surgeon: Vita Erm., MD;  Location: [facility];  Service: Ophthalmology;  Laterality: Right;   CHALAZION EXCISION Right 03/28/2013   Procedure: MINOR EXCISION OF CHALAZION UPPER AND LOWER RIGHT EYE  ;  Surgeon: Vita Erm., MD;  Location: [facility] OR;  Service: Ophthalmology;  Laterality: Right;   COLONOSCOPY     HIP PINNING,CANNULATED Right 07/02/2022   Procedure: PERCUTANEOUS SCREW FIXATION OF HIP;  Surgeon: Jones Broom, MD;  Location: [facility];  Service: Orthopedics;  Laterality: Right;   TOTAL HIP ARTHROPLASTY Left 10/26/2017   Procedure: TOTAL HIP ARTHROPLASTY ANTERIOR APPROACH;  Surgeon: Gean Birchwood, MD;  Location: [facility];  Service: Orthopedics;  Laterality: Left;  High Sensitivity Troponin:  No results for input(s): "TROPONINIHS" in the last 720 hours.   Chemistry Recent Labs  Lab 07/25/23 1621 07/26/23 0320  NA 136 138  K 3.1* 3.2*  CL 103 102  CO2 25 24  GLUCOSE 255* 92  BUN 19 18  CREATININE 1.06* 0.88  CALCIUM 8.3* 8.8*  MG 2.0 2.0  GFRNONAA 49* >60  ANIONGAP 8 12    Recent Labs  Lab 07/25/23 1621  PROT 6.1*  ALBUMIN 3.2*  AST 31  ALT 45*  ALKPHOS 76  BILITOT 0.9   Lipids No results for input(s): "CHOL", "TRIG", "HDL", "LABVLDL", "LDLCALC", "CHOLHDL" in the last 168 hours.  Hematology Recent Labs  Lab 07/25/23 1621 07/26/23 0320  WBC 5.2 5.3  RBC 3.76* 4.11  HGB 11.2* 12.0  HCT 35.8* 37.2  MCV 95.2 90.5  MCH 29.8 29.2  MCHC 31.3 32.3  RDW 16.0* 15.9*  PLT 240 245   Thyroid No results for input(s): "TSH", "FREET4" in the last 168 hours.  BNP Recent Labs  Lab 07/25/23 2108  BNP >4,500.0*    DDimer No results for input(s): "DDIMER" in the last 168 hours.   Radiology/Studies:  DG Chest Portable 1 View  Result Date: 07/25/2023 CLINICAL DATA:  Shortness of breath, hypoxia EXAM: PORTABLE CHEST 1 VIEW COMPARISON:  07/01/2022 FINDINGS: Moderate bilateral pleural effusions. Bibasilar atelectasis. Heart mediastinal contours within normal limits. Aortic atherosclerosis. IMPRESSION: Moderate bilateral pleural effusions with bibasilar atelectasis. Electronically Signed   By: Charlett Nose M.D.   On: 07/25/2023 19:12   VAS [place] LOWER EXTREMITY VENOUS (DVT) (7a-7p)  Result Date: 07/25/2023  Lower Venous DVT Study Patient Name:  Alicia Vang  Date of Exam:   07/25/2023 Medical Rec #: [id number]            Accession #:    [id number] Date of Birth: [birth year]           Patient Gender: F Patient Age:   63 years Exam Location:  [facility] Procedure:      VAS [place] LOWER EXTREMITY VENOUS (DVT) Referring Phys: Griffin Basil ROEMHILDT  --------------------------------------------------------------------------------  Indications: Swelling.  Risk Factors: None identified. Limitations: Poor ultrasound/tissue interface and patient positioning. Comparison Study: No prior studies. Performing Technologist: Chanda Busing RVT  Examination Guidelines: A complete evaluation includes B-mode imaging, spectral Doppler, color Doppler, and power Doppler as needed of all accessible portions of each vessel. Bilateral testing is considered an integral part of a complete examination. Limited examinations for reoccurring indications may be performed as noted. The reflux portion of the exam is performed with the patient in reverse Trendelenburg.  +---------+---------------+---------+-----------+----------+--------------+ RIGHT    CompressibilityPhasicitySpontaneityPropertiesThrombus Aging +---------+---------------+---------+-----------+----------+--------------+ CFV      Full           Yes      No                                  +---------+---------------+---------+-----------+----------+--------------+ SFJ      Full                                                        +---------+---------------+---------+-----------+----------+--------------+ FV Prox  Full                                                        +---------+---------------+---------+-----------+----------+--------------+

## 2023-07-26 NOTE — Evaluation (Signed)
Clinical/Bedside Swallow Evaluation Patient Details  Name: CONSWELLO PEVEY MRN: [id number] Date of Birth: 07-13-1927  Today's Date: 07/26/2023 Time: SLP Start Time (ACUTE ONLY): 4098 SLP Stop Time (ACUTE ONLY): 0940 SLP Time Calculation (min) (ACUTE ONLY): 15 min  Past Medical History:  Past Medical History:  Diagnosis Date   Arthritis    Diabetes mellitus without complication (HCC)    Type II   Hypertension    Hypokalemia    Past Surgical History:  Past Surgical History:  Procedure Laterality Date   ABDOMINAL HYSTERECTOMY     CHALAZION EXCISION  11/06/2011   Procedure: MINOR EXCISION OF CHALAZION;  Surgeon: Vita Erm.;  Location: [facility];  Service: Ophthalmology;  Laterality: Left;   CHALAZION EXCISION  02/02/2012   Procedure: MINOR EXCISION OF CHALAZION;  Surgeon: Vita Erm., MD;  Location: [facility];  Service: Ophthalmology;  Laterality: Left;  left eye upper lid   CHALAZION EXCISION Right 03/28/2013   Procedure: MINOR EXCISION OF CHALAZION upper and lower right eye ;  Surgeon: Vita Erm., MD;  Location: [facility];  Service: Ophthalmology;  Laterality: Right;   CHALAZION EXCISION Right 03/28/2013   Procedure: MINOR EXCISION OF CHALAZION UPPER AND LOWER RIGHT EYE  ;  Surgeon: Vita Erm., MD;  Location: [facility] OR;  Service: Ophthalmology;  Laterality: Right;   COLONOSCOPY     HIP PINNING,CANNULATED Right 07/02/2022   Procedure: PERCUTANEOUS SCREW FIXATION OF HIP;  Surgeon: Jones Broom, MD;  Location: [facility] OR;  Service: Orthopedics;  Laterality: Right;   TOTAL HIP ARTHROPLASTY Left 10/26/2017   Procedure: TOTAL HIP ARTHROPLASTY ANTERIOR APPROACH;  Surgeon: Gean Birchwood, MD;  Location: [facility] OR;  Service: Orthopedics;  Laterality: Left;   HPI:  This is a 89 year old female who lives at home with her daughter.  Patient has been bedbound since hip surgery approximately a year ago.  Her past  medical history significant for HTN, Parkinson's disease.  Pt presented to the ED with SOB and RLE swelling. CXR remarkable for moderate bilateral pleural effusions with bibasilar atelectasis.    Assessment / Plan / Recommendation  Clinical Impression  Pt was seen for a clinical swallow evaluation and she presents with suspected oropharyngeal dysphagia.  Pt was unable to complete oral mechanism examination, but oral cavity was noted to be dry with white/yellow coating on posterior lingual surface, concerning for thrush.  Pt is edentulous.  She was seen with trials of ice chips, thin liquid, and puree.  Pt exhibited an immediate cough with thin liquid via tsp x1, but otherwise tolerated trials without overt s/sx of aspiration.  Suspect delayed swallow initiation and audible swallows were noted with thin liquid.  Pt was additionally observed to be mildly impulsive with thin liquid via straw, taking large serial sips despite cues for single sips.  Recommend initiation of Dysphagia 1 (puree) solids and thin liquids with medications administered crushed in puree and strict adherence to aspiration precautions.  SLP Visit Diagnosis: Dysphagia, oropharyngeal phase (R13.12)    Aspiration Risk  Mild aspiration risk    Diet Recommendation Dysphagia 1 (Puree);Thin liquid    Liquid Administration via: Cup;Straw Medication Administration: Crushed with puree Supervision: Staff to assist with self feeding;Full supervision/cueing for compensatory strategies Compensations: Minimize environmental distractions;Small sips/bites;Slow rate Postural Changes: Seated upright at 90 degrees    Other  Recommendations Oral Care Recommendations: Oral care BID;Staff/trained caregiver to provide oral care    Recommendations for follow up therapy  are one component of a multi-disciplinary discharge planning process, led by the attending physician.  Recommendations may be updated based on patient status, additional functional  criteria and insurance authorization.  Follow up Recommendations Long-term institutional care without follow-up therapy      Assistance Recommended at Discharge    Functional Status Assessment Patient has had a recent decline in their functional status and demonstrates the ability to make significant improvements in function in a reasonable and predictable amount of time.  Frequency and Duration min 2x/week  2 weeks       Prognosis Prognosis for improved oropharyngeal function: Guarded      Swallow Study   General Date of Onset: 07/26/23 HPI: This is a 89 year old female who lives at home with her daughter.  Patient has been bedbound since hip surgery approximately a year ago.  Her past medical history significant for HTN, Parkinson's disease.  Pt presented to the ED with SOB and RLE swelling. CXR remarkable for moderate bilateral pleural effusions with bibasilar atelectasis. Type of Study: Bedside Swallow Evaluation Previous Swallow Assessment: N/A Diet Prior to this Study: NPO Temperature Spikes Noted: No Respiratory Status: Room air History of Recent Intubation: No Behavior/Cognition: Alert;Requires cueing Oral Cavity Assessment: Dry (suspect thrush) Oral Care Completed by SLP: No Oral Cavity - Dentition: Edentulous Self-Feeding Abilities: Needs assist Patient Positioning: Upright in bed Baseline Vocal Quality: Low vocal intensity Volitional Cough: Weak Volitional Swallow: Unable to elicit    Oral/Motor/Sensory Function Overall Oral Motor/Sensory Function: Other (comment) (unable to follow commands)   Ice Chips Ice chips: Within functional limits Presentation: Spoon   Thin Liquid Thin Liquid: Impaired Presentation: Spoon;Straw Pharyngeal  Phase Impairments: Suspected delayed Swallow;Cough - Immediate    Nectar Thick Nectar Thick Liquid: Not tested   Honey Thick Honey Thick Liquid: Not tested   Puree Puree: Within functional limits Presentation: Spoon   Solid      Solid: Not tested     Eino Farber, M.S., CCC-SLP Acute Rehabilitation Services Office: [phone]  Shanon Rosser Shamari Trostel 07/26/2023,9:49 AM

## 2023-07-26 NOTE — ED Notes (Signed)
ED TO INPATIENT HANDOFF REPORT  ED Nurse Name and Phone #: Sherre Scarlet Name/Age/Gender Irene Shipper Rozell 89 y.o. female Room/Bed: H013C/H013C  Code Status   Code Status: Full Code  Home/SNF/Other Home Patient oriented to: self Is this baseline? Yes   Triage Complete: Triage complete  Chief Complaint Acute congestive heart failure (HCC) [I50.9]  Triage Note Pt coming in via POV with daughter. Pt unable to answer any questions. Family states pt has been gasping for air and has a swollen right foot. Pt family states she took pt's vitals at home."Last night she could hardly breathe and had a lot of congestion." Pt family states the right foot just started swelling the past couple of days and her blood sugar has been elevated. Daughter states pt is sometimes able to answer questions. Pt oriented to self.    Allergies Allergies  Allergen Reactions   Amlodipine Swelling    Swelling in feet    Level of Care/Admitting Diagnosis ED Disposition     ED Disposition  Admit   Condition  --   Comment  Hospital Area: [facility] [[id number]]  Level of Care: Telemetry Cardiac [[id number]]  May admit patient to [facility] or [facility] if equivalent level of care is available:: No  Covid Evaluation: Asymptomatic - no recent exposure (last 10 days) testing not required  Diagnosis: Acute congestive heart failure [facility]) [[id number]]  Admitting Physician: Gery Pray [[id number]]  Attending Physician: Gery Pray [[id number]]  Certification:: I certify this patient will need inpatient services for at least 2 midnights  Expected Medical Readiness: 07/27/2023          B Medical/Surgery History Past Medical History:  Diagnosis Date   Arthritis    Diabetes mellitus without complication (HCC)    Type II   Hypertension    Hypokalemia    Past Surgical History:  Procedure Laterality Date   ABDOMINAL HYSTERECTOMY     CHALAZION EXCISION  11/06/2011   Procedure: MINOR  EXCISION OF CHALAZION;  Surgeon: Vita Erm.;  Location: [facility];  Service: Ophthalmology;  Laterality: Left;   CHALAZION EXCISION  02/02/2012   Procedure: MINOR EXCISION OF CHALAZION;  Surgeon: Vita Erm., MD;  Location: [facility];  Service: Ophthalmology;  Laterality: Left;  left eye upper lid   CHALAZION EXCISION Right 03/28/2013   Procedure: MINOR EXCISION OF CHALAZION upper and lower right eye ;  Surgeon: Vita Erm., MD;  Location: [facility];  Service: Ophthalmology;  Laterality: Right;   CHALAZION EXCISION Right 03/28/2013   Procedure: MINOR EXCISION OF CHALAZION UPPER AND LOWER RIGHT EYE  ;  Surgeon: Vita Erm., MD;  Location: [facility] OR;  Service: Ophthalmology;  Laterality: Right;   COLONOSCOPY     HIP PINNING,CANNULATED Right 07/02/2022   Procedure: PERCUTANEOUS SCREW FIXATION OF HIP;  Surgeon: Jones Broom, MD;  Location: [facility];  Service: Orthopedics;  Laterality: Right;   TOTAL HIP ARTHROPLASTY Left 10/26/2017   Procedure: TOTAL HIP ARTHROPLASTY ANTERIOR APPROACH;  Surgeon: Gean Birchwood, MD;  Location: [facility];  Service: Orthopedics;  Laterality: Left;     A IV Location/Drains/Wounds Patient Lines/Drains/Airways Status     Active Line/Drains/Airways     Name Placement date Placement time Site Days   Peripheral IV 07/25/23 20 G Anterior;Distal;Left;Upper Arm 07/25/23  1836  Arm  1   Incision (Closed) 10/26/17 Hip Left 10/26/17  1024  -- 2099   Incision (Closed)  07/02/22 Hip Right 07/02/22  1543  -- 389            Intake/Output Last 24 hours No intake or output data in the 24 hours ending 07/26/23 0009  Labs/Imaging Results for orders placed or performed during the hospital encounter of 07/25/23 (from the past 48 hour(s))  CBC     Status: Abnormal   Collection Time: 07/25/23  4:21 PM  Result Value Ref Range   WBC 5.2 4.0 - 10.5 K/uL   RBC 3.76 (L) 3.87 - 5.11 MIL/uL    Hemoglobin 11.2 (L) 12.0 - 15.0 g/dL   HCT 96.2 (L) 95.2 - 84.1 %   MCV 95.2 80.0 - 100.0 fL   MCH 29.8 26.0 - 34.0 pg   MCHC 31.3 30.0 - 36.0 g/dL   RDW 32.4 (H) 40.1 - 02.7 %   Platelets 240 150 - 400 K/uL   nRBC 0.0 0.0 - 0.2 %    Comment: Performed at [facility], [address] [place], [place] [address]  Comprehensive metabolic panel     Status: Abnormal   Collection Time: 07/25/23  4:21 PM  Result Value Ref Range   Sodium 136 135 - 145 mmol/L   Potassium 3.1 (L) 3.5 - 5.1 mmol/L   Chloride 103 98 - 111 mmol/L   CO2 25 22 - 32 mmol/L   Glucose, Bld 255 (H) 70 - 99 mg/dL    Comment: Glucose reference range applies only to samples taken after fasting for at least 8 hours.   BUN 19 8 - 23 mg/dL   Creatinine, Ser 4.40 (H) 0.44 - 1.00 mg/dL   Calcium 8.3 (L) 8.9 - 10.3 mg/dL   Total Protein 6.1 (L) 6.5 - 8.1 g/dL   Albumin 3.2 (L) 3.5 - 5.0 g/dL   AST 31 15 - 41 U/L   ALT 45 (H) 0 - 44 U/L   Alkaline Phosphatase 76 38 - 126 U/L   Total Bilirubin 0.9 0.3 - 1.2 mg/dL   GFR, Estimated 49 (L) >60 mL/min    Comment: (NOTE) Calculated using the CKD-EPI Creatinine Equation (2021)    Anion gap 8 5 - 15    Comment: Performed at [facility], [address], [place], [place] [address]  Resp panel by RT-PCR (RSV, Flu A&B, Covid) Anterior Nasal Swab     Status: None   Collection Time: 07/25/23  4:21 PM   Specimen: Anterior Nasal Swab  Result Value Ref Range   SARS Coronavirus 2 by RT PCR NEGATIVE NEGATIVE   Influenza A by PCR NEGATIVE NEGATIVE   Influenza B by PCR NEGATIVE NEGATIVE    Comment: (NOTE) The Xpert Xpress SARS-CoV-2/FLU/RSV plus assay is intended as an aid in the diagnosis of influenza from Nasopharyngeal swab specimens and should not be used as a sole basis for treatment. Nasal washings and aspirates are unacceptable for Xpert Xpress SARS-CoV-2/FLU/RSV testing.  Fact Sheet for Patients: BloggerCourse.com  Fact Sheet for  Healthcare Providers: SeriousBroker.it  This test is not yet approved or cleared by the [place] FDA and has been authorized for detection and/or diagnosis of SARS-CoV-2 by FDA under an Emergency Use Authorization (EUA). This EUA will remain in effect (meaning this test can be used) for the duration of the COVID-19 declaration under Section 564(b)(1) of the Act, 21 U.S.C. section 360bbb-3(b)(1), unless the authorization is terminated or revoked.     Resp Syncytial Virus by PCR NEGATIVE NEGATIVE    Comment: (NOTE) Fact Sheet  for Patients: BloggerCourse.com  Fact Sheet for Healthcare Providers: SeriousBroker.it  This test is not yet approved or cleared by the [place] FDA and has been authorized for detection and/or diagnosis of SARS-CoV-2 by FDA under an Emergency Use Authorization (EUA). This EUA will remain in effect (meaning this test can be used) for the duration of the COVID-19 declaration under Section 564(b)(1) of the Act, 21 U.S.C. section 360bbb-3(b)(1), unless the authorization is terminated or revoked.  Performed at [facility], [address], [place], [place] [address]   Magnesium     Status: None   Collection Time: 07/25/23  4:21 PM  Result Value Ref Range   Magnesium 2.0 1.7 - 2.4 mg/dL    Comment: Performed at [facility], [address], [place], [place] [address]  CBG monitoring, ED     Status: Abnormal   Collection Time: 07/25/23  4:45 PM  Result Value Ref Range   Glucose-Capillary 262 (H) 70 - 99 mg/dL    Comment: Glucose reference range applies only to samples taken after fasting for at least 8 hours.  Brain natriuretic peptide     Status: Abnormal   Collection Time: 07/25/23  9:08 PM  Result Value Ref Range   B Natriuretic Peptide >4,500.0 (H) 0.0 - 100.0 pg/mL    Comment: Performed at [facility], [address], [place], [place] [address]   DG  Chest Portable 1 View  Result Date: 07/25/2023 CLINICAL DATA:  Shortness of breath, hypoxia EXAM: PORTABLE CHEST 1 VIEW COMPARISON:  07/01/2022 FINDINGS: Moderate bilateral pleural effusions. Bibasilar atelectasis. Heart mediastinal contours within normal limits. Aortic atherosclerosis. IMPRESSION: Moderate bilateral pleural effusions with bibasilar atelectasis. Electronically Signed   By: Charlett Nose M.D.   On: 07/25/2023 19:12   VAS [place] LOWER EXTREMITY VENOUS (DVT) (7a-7p)  Result Date: 07/25/2023  Lower Venous DVT Study Patient Name:  RYNLEIGH STAAL  Date of Exam:   07/25/2023 Medical Rec #: [id number]            Accession #:    [id number] Date of Birth: 1926-10-31           Patient Gender: F Patient Age:   58 years Exam Location:  [facility] Procedure:      VAS [place] LOWER EXTREMITY VENOUS (DVT) Referring Phys: Griffin Basil ROEMHILDT --------------------------------------------------------------------------------  Indications: Swelling.  Risk Factors: None identified. Limitations: Poor ultrasound/tissue interface and patient positioning. Comparison Study: No prior studies. Performing Technologist: Chanda Busing RVT  Examination Guidelines: A complete evaluation includes B-mode imaging, spectral Doppler, color Doppler, and power Doppler as needed of all accessible portions of each vessel. Bilateral testing is considered an integral part of a complete examination. Limited examinations for reoccurring indications may be performed as noted. The reflux portion of the exam is performed with the patient in reverse Trendelenburg.  +---------+---------------+---------+-----------+----------+--------------+ RIGHT    CompressibilityPhasicitySpontaneityPropertiesThrombus Aging +---------+---------------+---------+-----------+----------+--------------+ CFV      Full           Yes      No                                  +---------+---------------+---------+-----------+----------+--------------+ SFJ       Full                                                        +---------+---------------+---------+-----------+----------+--------------+  FV Prox  Full                                                        +---------+---------------+---------+-----------+----------+--------------+ FV Mid   Full                                                        +---------+---------------+---------+-----------+----------+--------------+ FV DistalFull                                                        +---------+---------------+---------+-----------+----------+--------------+ PFV      Full                                                        +---------+---------------+---------+-----------+----------+--------------+ POP      Full           Yes      No                                  +---------+---------------+---------+-----------+----------+--------------+ PTV      Full                                                        +---------+---------------+---------+-----------+----------+--------------+ PERO     Full                                                        +---------+---------------+---------+-----------+----------+--------------+   +----+---------------+---------+-----------+----------+--------------+ LEFTCompressibilityPhasicitySpontaneityPropertiesThrombus Aging +----+---------------+---------+-----------+----------+--------------+ CFV Full           Yes      No                                  +----+---------------+---------+-----------+----------+--------------+    Summary: RIGHT: - There is no evidence of deep vein thrombosis in the lower extremity.  - No cystic structure found in the popliteal fossa.  LEFT: - No evidence of common femoral vein obstruction.   *See table(s) above for measurements and observations.    Preliminary     Pending Labs Unresulted Labs (From admission, onward)     Start     Ordered   07/26/23 0500  Basic  metabolic panel  Daily,   R     Comments: As Scheduled for 5 days    07/25/23 2311   07/26/23 0500  CBC with Differential/Platelet  Tomorrow morning,   R  07/25/23 2311   07/26/23 0500  Magnesium  Tomorrow morning,   R        07/25/23 2311   07/25/23 2316  Hemoglobin A1c  Once,   R       Comments: To assess prior glycemic control    07/25/23 2316            Vitals/Pain Today's Vitals   07/25/23 1728 07/25/23 1845 07/25/23 1935 07/25/23 2110  BP: (!) 162/94 (!) 190/110 (!) 181/106 (!) 164/98  Pulse: 71 (!) 57 67 68  Resp: (!) 33 (!) 27 (!) 24 18  Temp:      TempSrc:      SpO2: 99% 100% 100% 100%  Weight:      Height:        Isolation Precautions No active isolations  Medications Medications  acetaminophen (TYLENOL) tablet 650 mg (has no administration in time range)  latanoprost (XALATAN) 0.005 % ophthalmic solution 1 drop (1 drop Both Eyes Given 07/26/23 0005)  metoprolol succinate (TOPROL-XL) 24 hr tablet 50 mg (has no administration in time range)  Lifitegrast 5 % SOLN 1 drop (has no administration in time range)  Vitamin D (Ergocalciferol) (DRISDOL) 1.25 MG (50000 UNIT) capsule 50,000 Units (has no administration in time range)  polyethylene glycol (MIRALAX / GLYCOLAX) packet 17 g (has no administration in time range)  losartan (COZAAR) tablet 50 mg (has no administration in time range)  brinzolamide (AZOPT) 1 % ophthalmic suspension 1 drop (has no administration in time range)  sodium chloride flush (NS) 0.9 % injection 3 mL (has no administration in time range)  sodium chloride flush (NS) 0.9 % injection 3 mL (has no administration in time range)  0.9 %  sodium chloride infusion (has no administration in time range)  acetaminophen (TYLENOL) tablet 650 mg (has no administration in time range)  ondansetron (ZOFRAN) injection 4 mg (has no administration in time range)  furosemide (LASIX) injection 40 mg (has no administration in time range)  insulin aspart  (novoLOG) injection 0-15 Units (has no administration in time range)  potassium chloride 10 mEq in 100 mL IVPB (has no administration in time range)  furosemide (LASIX) injection 40 mg (40 mg Intravenous Given 07/26/23 0002)  potassium chloride SA (KLOR-CON M) CR tablet 40 mEq (40 mEq Oral Given 07/26/23 0008)    Mobility non-ambulatory     Focused Assessments    R Recommendations: See Admitting Provider Note  Report given to:   Additional Notes: daughter/caregiver is at bedside

## 2023-07-26 NOTE — Plan of Care (Signed)
  Problem: Education: Goal: Ability to describe self-care measures that may prevent or decrease complications (Diabetes Survival Skills Education) will improve Outcome: Not Progressing   Problem: Coping: Goal: Ability to adjust to condition or change in health will improve Outcome: Not Progressing   Problem: Fluid Volume: Goal: Ability to maintain a balanced intake and output will improve Outcome: Not Progressing   Problem: Health Behavior/Discharge Planning: Goal: Ability to identify and utilize available resources and services will improve Outcome: Not Progressing Goal: Ability to manage health-related needs will improve Outcome: Not Progressing   Problem: Nutritional: Goal: Maintenance of adequate nutrition will improve Outcome: Not Progressing Goal: Progress toward achieving an optimal weight will improve Outcome: Not Progressing   Problem: Skin Integrity: Goal: Risk for impaired skin integrity will decrease Outcome: Not Progressing   Problem: Education: Goal: Knowledge of General Education information will improve Description: Including pain rating scale, medication(s)/side effects and non-pharmacologic comfort measures Outcome: Not Progressing   Problem: Health Behavior/Discharge Planning: Goal: Ability to manage health-related needs will improve Outcome: Not Progressing

## 2023-07-26 NOTE — TOC Initial Note (Addendum)
Transition of Care [facility]) - Initial/Assessment Note    Patient Details  Name: Alicia Vang MRN: [id number] Date of Birth: 03-15-1927  Transition of Care [facility]) CM/SW Contact:    Leone Haven, RN Phone Number: 07/26/2023, 4:59 PM  Clinical Narrative:                 From home with daughter,  has PCP and insurance on file, states has no HH services in place at this time, she has hospital bed, lift, w/chair, walker, cane at home. States may need ambulance l transport at [facility] and daughter is support system, states [facility] [address]  She has been to [facility] before.  Daughter is interested in [facility] or [facility].  Await PT/OT eval. Will make CSW aware.   Expected Discharge Plan: Skilled Nursing Facility Barriers to Discharge: Continued Medical Work up   Patient Goals and CMS Choice Patient states their goals for this hospitalization and ongoing recovery are:: SNF CMS Medicare.gov Compare Post Acute Care list provided to:: Patient Represenative (must comment) Choice offered to / list presented to : Adult Children      Expected Discharge Plan and Services In-house Referral: Clinical Social Work Discharge Planning Services: CM Consult Post Acute Care Choice: NA Living arrangements for the past 2 months: Single Family Home                 DME Arranged: N/A DME Agency: NA       HH Arranged: NA          Prior Living Arrangements/Services Living arrangements for the past 2 months: Single Family Home Lives with:: Adult Children (daughter) Patient language and need for interpreter reviewed:: Yes Do you feel safe going back to the place where you live?: Yes      Need for Family Participation in Patient Care: Yes (Comment) Care giver support system in place?: Yes (comment) Current home services: DME (hospital bed, w/chair, lift, walkers, cane) Criminal Activity/Legal Involvement Pertinent to Current  Situation/Hospitalization: No - Comment as needed  Activities of Daily Living   ADL Screening (condition at time of admission) Independently performs ADLs?: No Does the patient have a NEW difficulty with bathing/dressing/toileting/self-feeding that is expected to last >3 days?: No Does the patient have a NEW difficulty with getting in/out of bed, walking, or climbing stairs that is expected to last >3 days?: No Does the patient have a NEW difficulty with communication that is expected to last >3 days?: Yes (Initiates electronic notice to provider for possible SLP consult) Is the patient deaf or have difficulty hearing?: No Does the patient have difficulty seeing, even when wearing glasses/contacts?: Yes Does the patient have difficulty concentrating, remembering, or making decisions?: Yes  Permission Sought/Granted Permission sought to share information with : Case Manager Permission granted to share information with : Yes, Verbal Permission Granted              Emotional Assessment Appearance:: Appears stated age       Alcohol / Substance Use: Not Applicable Psych Involvement: No (comment)  Admission diagnosis:  Acute congestive heart failure (HCC) [I50.9] Congestive heart failure, unspecified HF chronicity, unspecified heart failure type (HCC) [I50.9] Patient Active Problem List   Diagnosis Date Noted   Acute congestive heart failure (HCC) 07/25/2023   Chronic kidney disease, stage 3a (HCC) 07/05/2022   Hypoalbuminemia 07/05/2022   Closed displaced fracture of right femoral neck (HCC) 07/01/2022   Traumatic intraventricular hemorrhage (HCC)  07/01/2022   Closed compression fracture of L3 lumbar vertebra, with delayed healing, subsequent encounter 06/01/2021   Parkinson's disease (HCC) 06/01/2021    Class: Question of   Essential hypertension 06/01/2021   COVID-19 virus infection 10/21/2020   Bradycardia 10/21/2020   Type 2 diabetes mellitus without complication, without  long-term current use of insulin (HCC) 10/21/2020   Primary osteoarthritis of left hip 10/26/2017   Osteoarthritis of left hip 08/17/2017   PCP:  Knox Royalty, MD [phone] [place] Otto, [place] - [address] [place] [address] Phone: [phone] Fax: [phone]     Social Determinants of Health (SDOH) Social History: SDOH Screenings   Food Insecurity: No Food Insecurity (07/26/2023)  Housing: Low Risk  (07/26/2023)  Transportation Needs: No Transportation Needs (07/26/2023)  Utilities: Not At Risk (07/26/2023)  Tobacco Use: Low Risk  (07/25/2023)   SDOH Interventions:     Readmission Risk Interventions     No data to display

## 2023-07-27 DIAGNOSIS — I10 Essential (primary) hypertension: Secondary | ICD-10-CM | POA: Diagnosis not present

## 2023-07-27 DIAGNOSIS — I420 Dilated cardiomyopathy: Secondary | ICD-10-CM | POA: Diagnosis not present

## 2023-07-27 DIAGNOSIS — I5021 Acute systolic (congestive) heart failure: Secondary | ICD-10-CM

## 2023-07-27 DIAGNOSIS — J90 Pleural effusion, not elsewhere classified: Secondary | ICD-10-CM | POA: Diagnosis not present

## 2023-07-27 DIAGNOSIS — Z7189 Other specified counseling: Secondary | ICD-10-CM | POA: Diagnosis not present

## 2023-07-27 DIAGNOSIS — I5023 Acute on chronic systolic (congestive) heart failure: Secondary | ICD-10-CM | POA: Insufficient documentation

## 2023-07-27 DIAGNOSIS — Z515 Encounter for palliative care: Secondary | ICD-10-CM | POA: Diagnosis not present

## 2023-07-27 DIAGNOSIS — I5042 Chronic combined systolic (congestive) and diastolic (congestive) heart failure: Secondary | ICD-10-CM | POA: Insufficient documentation

## 2023-07-27 DIAGNOSIS — I509 Heart failure, unspecified: Secondary | ICD-10-CM | POA: Diagnosis not present

## 2023-07-27 LAB — BASIC METABOLIC PANEL
Anion gap: 13 (ref 5–15)
BUN: 16 mg/dL (ref 8–23)
CO2: 30 mmol/L (ref 22–32)
Calcium: 8.8 mg/dL — ABNORMAL LOW (ref 8.9–10.3)
Chloride: 101 mmol/L (ref 98–111)
Creatinine, Ser: 0.98 mg/dL (ref 0.44–1.00)
GFR, Estimated: 54 mL/min — ABNORMAL LOW (ref >60)
Glucose, Bld: 74 mg/dL (ref 70–99)
Potassium: 3.6 mmol/L (ref 3.5–5.1)
Sodium: 144 mmol/L (ref 135–145)

## 2023-07-27 LAB — GLUCOSE, CAPILLARY
Glucose-Capillary: 80 mg/dL (ref 70–99)
Glucose-Capillary: 94 mg/dL (ref 70–99)
Glucose-Capillary: 133 mg/dL — ABNORMAL HIGH (ref 70–99)
Glucose-Capillary: 75 mg/dL (ref 70–99)
Glucose-Capillary: 183 mg/dL — ABNORMAL HIGH (ref 70–99)

## 2023-07-27 LAB — PATHOLOGIST SMEAR REVIEW

## 2023-07-27 LAB — MAGNESIUM: Magnesium: 1.9 mg/dL (ref 1.7–2.4)

## 2023-07-27 MED ORDER — BOOST / RESOURCE BREEZE PO LIQD CUSTOM
1.0000 | Freq: Three times a day (TID) | ORAL | Status: DC
  Administered 2023-07-27 – 2023-08-08 (×25): 1 via ORAL
  Filled 2023-07-27: qty 1

## 2023-07-27 MED ORDER — FUROSEMIDE 10 MG/ML IJ SOLN
40.0000 mg | Freq: Every day | INTRAMUSCULAR | Status: DC
  Administered 2023-07-28 – 2023-07-29 (×2): 40 mg via INTRAVENOUS
  Filled 2023-07-27 (×2): qty 4

## 2023-07-27 MED ORDER — ENOXAPARIN SODIUM 40 MG/0.4ML IJ SOSY
40.0000 mg | PREFILLED_SYRINGE | INTRAMUSCULAR | Status: DC
  Administered 2023-07-27 – 2023-07-29 (×3): 40 mg via SUBCUTANEOUS
  Filled 2023-07-27 (×3): qty 0.4

## 2023-07-27 MED ORDER — ADULT MULTIVITAMIN W/MINERALS CH
1.0000 | ORAL_TABLET | Freq: Every day | ORAL | Status: DC
  Administered 2023-07-27 – 2023-08-08 (×11): 1 via ORAL
  Filled 2023-07-27 (×13): qty 1

## 2023-07-27 MED ORDER — INFLUENZA VAC A&B SURF ANT ADJ 0.5 ML IM SUSY
0.5000 mL | PREFILLED_SYRINGE | INTRAMUSCULAR | Status: DC
  Filled 2023-07-27: qty 0.5

## 2023-07-27 MED ORDER — SPIRONOLACTONE 12.5 MG HALF TABLET
12.5000 mg | ORAL_TABLET | Freq: Every day | ORAL | Status: DC
  Administered 2023-07-27 – 2023-08-03 (×7): 12.5 mg via ORAL
  Filled 2023-07-27 (×7): qty 1

## 2023-07-27 NOTE — Progress Notes (Signed)
Speech Language Pathology Treatment: Dysphagia  Patient Details Name: Alicia Vang MRN: [id number] DOB: 05-26-1927 Today's Date: 07/27/2023 Time: 6433-2951 SLP Time Calculation (min) (ACUTE ONLY): 12 min  Assessment / Plan / Recommendation Clinical Impression  Pt's daughter at bedside this morning and pt able to feed herself with packages open and tray set up. Noted RN note that pt holding food last night however this morning she is manipulating and transitioning puree items timely without residue and has good appetite. She did have one episode of coughing when she took a larger sip with straw of thin but when cued to take smaller sips there were no s/s aspiration. Daughter reports at home pt eats food that the she cuts up. We discussed here in the hospital that puree seems most appropriate especially if she has periods of holding food intermittently and daughter in agreement. Will plan to follow up once more.    HPI HPI: This is a 89 year old female who lives at home with her daughter.  Patient has been bedbound since hip surgery approximately a year ago.  Her past medical history significant for HTN, Parkinson's disease.  Pt presented to the ED with SOB and RLE swelling. CXR remarkable for moderate bilateral pleural effusions with bibasilar atelectasis.      SLP Plan  Continue with current plan of care      Recommendations for follow up therapy are one component of a multi-disciplinary discharge planning process, led by the attending physician.  Recommendations may be updated based on patient status, additional functional criteria and insurance authorization.    Recommendations  Diet recommendations: Dysphagia 1 (puree);Thin liquid Liquids provided via: Straw;Cup Medication Administration: Crushed with puree Supervision: Patient able to self feed;Full supervision/cueing for compensatory strategies Compensations: Minimize environmental distractions;Small sips/bites;Slow  rate Postural Changes and/or Swallow Maneuvers: Seated upright 90 degrees                  Oral care BID   Frequent or constant Supervision/Assistance Dysphagia, oropharyngeal phase (R13.12)     Continue with current plan of care     Royce Macadamia  07/27/2023, 9:00 AM

## 2023-07-27 NOTE — NC FL2 (Signed)
[place] MEDICAID FL2 LEVEL OF CARE FORM     IDENTIFICATION  Patient Name: Alicia Vang Birthdate: 08/25/1927 Sex: female Admission Date (Current Location): 07/25/2023  [facility] and [place] Number:  Producer, television/film/video and Address:  [facility] [place] [facility] [place], [address], [place], [place] [address]      Provider Number: [id number]  Attending Physician Name and Address:  Glade Lloyd, MD  Relative Name and Phone Number:       Current Level of Care: Hospital Recommended Level of Care: Skilled Nursing Facility Prior Approval Number:    Date Approved/Denied:   PASRR Number: [id number]  Discharge Plan: SNF    Current Diagnoses: Patient Active Problem List   Diagnosis Date Noted   Acute congestive heart failure (HCC) 07/25/2023   Chronic kidney disease, stage 3a (HCC) 07/05/2022   Hypoalbuminemia 07/05/2022   Closed displaced fracture of right femoral neck (HCC) 07/01/2022   Traumatic intraventricular hemorrhage (HCC) 07/01/2022   Closed compression fracture of L3 lumbar vertebra, with delayed healing, subsequent encounter 06/01/2021   Parkinson's disease (HCC) 06/01/2021   Essential hypertension 06/01/2021   COVID-19 virus infection 10/21/2020   Bradycardia 10/21/2020   Type 2 diabetes mellitus without complication, without long-term current use of insulin (HCC) 10/21/2020   Primary osteoarthritis of left hip 10/26/2017   Osteoarthritis of left hip 08/17/2017    Orientation RESPIRATION BLADDER Height & Weight     Self  Normal Incontinent Weight: 92 lb 13 oz (42.1 kg) Height:  5\' 6"  (167.6 cm)  BEHAVIORAL SYMPTOMS/MOOD NEUROLOGICAL BOWEL NUTRITION STATUS      Incontinent    AMBULATORY STATUS COMMUNICATION OF NEEDS Skin   Extensive Assist Verbally Normal                       Personal Care Assistance Level of Assistance  Bathing, Feeding, Dressing Bathing Assistance: Maximum assistance Feeding assistance: Limited  assistance Dressing Assistance: Maximum assistance     Functional Limitations Info  Sight, Hearing, Speech Sight Info: Adequate Hearing Info: Adequate Speech Info: Adequate    SPECIAL CARE FACTORS FREQUENCY  PT (By licensed PT), OT (By licensed OT)                    Contractures Contractures Info: Not present    Additional Factors Info  Code Status Code Status Info: FULL CODE             Current Medications (07/27/2023):  This is the current hospital active medication list Current Facility-Administered Medications  Medication Dose Route Frequency Provider Last Rate Last Admin   0.9 %  sodium chloride infusion  250 mL Intravenous PRN Gery Pray, MD       acetaminophen (TYLENOL) tablet 650 mg  650 mg Oral Q6H PRN Sofia, Leslie K, PA-C       acetaminophen (TYLENOL) tablet 650 mg  650 mg Oral Q4H PRN Crosley, Debby, MD       brinzolamide (AZOPT) 1 % ophthalmic suspension 1 drop  1 drop Both Eyes QHS Sofia, Leslie K, PA-C   1 drop at 07/26/23 2222   enoxaparin (LOVENOX) injection 40 mg  40 mg Subcutaneous Q24H Alekh, Kshitiz, MD   40 mg at 07/27/23 1156   feeding supplement (ENSURE ENLIVE / ENSURE PLUS) liquid 237 mL  237 mL Oral BID BM Alekh, Kshitiz, MD   237 mL at 07/27/23 1001   [START ON 07/28/2023] furosemide (LASIX) injection 40 mg  40 mg Intravenous Daily  Marcelino Duster, PA       hydrALAZINE (APRESOLINE) injection 5 mg  5 mg Intravenous Q4H PRN Gery Pray, MD   5 mg at 07/26/23 1640   [START ON 07/28/2023] influenza vaccine adjuvanted (FLUAD) injection 0.5 mL  0.5 mL Intramuscular Tomorrow-1000 Alekh, Kshitiz, MD       insulin aspart (novoLOG) injection 0-15 Units  0-15 Units Subcutaneous Q4H Crosley, Debby, MD   2 Units at 07/27/23 1155   latanoprost (XALATAN) 0.005 % ophthalmic solution 1 drop  1 drop Both Eyes QHS Sofia, Leslie K, PA-C   1 drop at 07/26/23 2222   losartan (COZAAR) tablet 50 mg  50 mg Oral Daily Sofia, Leslie K, PA-C   50 mg at 07/27/23  1000   metoprolol succinate (TOPROL-XL) 24 hr tablet 50 mg  50 mg Oral Daily Sofia, Leslie K, PA-C   50 mg at 07/27/23 1000   ondansetron (ZOFRAN) injection 4 mg  4 mg Intravenous Q6H PRN Crosley, Debby, MD       polyethylene glycol (MIRALAX / GLYCOLAX) packet 17 g  17 g Oral Daily PRN Sofia, Leslie K, PA-C       potassium chloride SA (KLOR-CON M) CR tablet 40 mEq  40 mEq Oral Once Crosley, Debby, MD       sodium chloride flush (NS) 0.9 % injection 3 mL  3 mL Intravenous Q12H Crosley, Debby, MD   3 mL at 07/27/23 1000   sodium chloride flush (NS) 0.9 % injection 3 mL  3 mL Intravenous PRN Crosley, Debby, MD       spironolactone (ALDACTONE) tablet 12.5 mg  12.5 mg Oral Daily Marcelino Duster, PA   12.5 mg at 07/27/23 1150   [START ON 07/29/2023] Vitamin D (Ergocalciferol) (DRISDOL) 1.25 MG (50000 UNIT) capsule 50,000 Units  50,000 Units Oral Weekly Elson Areas, PA-C         Discharge Medications: Please see discharge summary for a list of discharge medications.  Relevant Imaging Results:  Relevant Lab Results:   Additional Information SS#: [id number]  Deatra Robinson, [place]

## 2023-07-27 NOTE — Progress Notes (Signed)
Heart Failure Navigator Progress Note  Assessed for Heart & Vascular TOC clinic readiness.  Patient bed bound, cardiology recommending quality of life and palliative consult. .   Navigator will sign off at this time.   Rhae Hammock, BSN, Scientist, clinical (histocompatibility and immunogenetics) Only

## 2023-07-27 NOTE — Progress Notes (Signed)
Initial Nutrition Assessment  DOCUMENTATION CODES:   Severe malnutrition in context of chronic illness, Underweight  INTERVENTION:  Continue diet per SLP Change Ensure to Boost Breeze po TID between meals, each supplement provides 250 kcal and 9 grams of protein Trial Carnation Breakfast Essentials BID with meals, each packet mixed with 8 ounces of 2% milk provides 13 grams of protein and 260 calories. MVI with minerals daily "High Calorie, High Protein Nutrition Therapy" handout added to AVS  NUTRITION DIAGNOSIS:   Severe Malnutrition related to chronic illness (CHF, Parkinson's) as evidenced by severe fat depletion, severe muscle depletion  GOAL:   Patient will meet greater than or equal to 90% of their needs  MONITOR:   PO intake, Supplement acceptance, Labs, Weight trends  REASON FOR ASSESSMENT:   Consult Assessment of nutrition requirement/status  ASSESSMENT:   Pt admitted with SOB r/t acute CHF. PMH significant for bedbound since hip surgery approximately one year ago, HTN, Parkinson's disease.  Pt unable to provide meaningful nutrition history, oriented x1. Pt's daughter, whom pt lives with at bedside able to provide history. She reports that since her hip fracture last year, she has been eating slower mentioning that she will eat 1 kernel of corn at a time or one small chopped up bit of food but feels that her portion sizes have remained the same. She goes to bed around 12am and gets up around 11a. She typically eats 2 meals per day which includes liquid eggs, grits or oatmeal, a biscuit and [place] sausage for breakfast around 1-2p. Dinner she will often eat something soft textured such as a meal from [employer] including 1 chicken thigh or 2 chicken legs, string beans, rice, mashed potatoes, and a biscuit or some chicken from [employer].   She has been concerned about her gradually progressing swallowing difficulties which is in part why she came to the hospital. SLP  following.   Pt's daughter reports that she has not eaten in the last couple days as she has been getting used to the food here. She ate some grits and pureed peaches this morning. Ensure at bedside but pt had not consumed. Reports that Ensure is too thick for her. Discussed alternative supplements that may be better tolerated.   Meal completions: 10/3: 10% lunch, 10% dinner  Pt's daughter reports that she was weighing about 137 lbs last fall. Over the last year her weight has declined to 87 lbs.   Unfortunately, there is limited documentation of weight history on file to review over the last year to assess for weight changes. Current weight noted to be 42.1 kg. Also with non-pitting RUE, RLE.   Medications: lasix 40mg  daily, SSI 0-15 units q4h, vitamin D 50,000 units weekly  Labs: GFR 54, CBG's 80-133 x24 hours  NUTRITION - FOCUSED PHYSICAL EXAM:  Flowsheet Row Most Recent Value  Orbital Region Severe depletion  Upper Arm Region Severe depletion  Thoracic and Lumbar Region Severe depletion  Buccal Region Severe depletion  Temple Region Severe depletion  Clavicle Bone Region Severe depletion  Clavicle and Acromion Bone Region Severe depletion  Scapular Bone Region Severe depletion  Dorsal Hand Severe depletion  Patellar Region Severe depletion  Anterior Thigh Region Severe depletion  Posterior Calf Region Severe depletion  Edema (RD Assessment) None  Hair Reviewed  Eyes Reviewed  Mouth Reviewed  Skin Reviewed  Nails Reviewed       Diet Order:   Diet Order  DIET - DYS 1 Room service appropriate? No; Fluid consistency: Thin  Diet effective now                   EDUCATION NEEDS:   Education needs have been addressed  Skin:  Skin Assessment: Reviewed RN Assessment Per WOC: 1. Deep Tissue Pressure Injury Coccyx  2. Stage 2 sacrum  Pressure Injury POA: Yes Measurement: 1.  DTPI 3 cm x 1 cm linear to coccyx, purple maroon discoloration skin intact   2.  Stage 2 PI sacrum 1 cm x 0.5 cm (per daughter this is old and healing) pink and dry   Last BM:  PTA  Height:   Ht Readings from Last 1 Encounters:  07/26/23 5\' 6"  (1.676 m)    Weight:   Wt Readings from Last 1 Encounters:  07/27/23 42.1 kg   BMI:  Body mass index is 14.98 kg/m.  Estimated Nutritional Needs:   Kcal:  1200-1400  Protein:  60-70g  Fluid:  1.2-1.4L  Alicia Vang, RDN, LDN Clinical Nutrition

## 2023-07-27 NOTE — TOC Progression Note (Signed)
Transition of Care [facility]) - Progression Note    Patient Details  Name: Alicia Vang MRN: [id number] Date of Birth: 12-14-1926  Transition of [facility]) CM/SW Contact  Dellie Burns [place], [place] Phone Number: 07/27/2023, 12:53 PM  Clinical Narrative: spoke with pt's dtr re PT/OT recommendation for SNF. Dtr reports pt has a history at [facility] and prefers them again or [facility]. Reviewed SNF placement process and answered questions. Will f/u with offers as available. Pt will need auth started for SNF closer to dc.   Dellie Burns, MSW, LCSW [phone] (coverage)        Expected Discharge Plan: Skilled Nursing Facility Barriers to Discharge: Continued Medical Work up  Expected Discharge Plan and Services In-house Referral: Clinical Social Work Discharge Planning Services: CM Consult Post Acute Care Choice: NA Living arrangements for the past 2 months: Single Family Home                 DME Arranged: N/A DME Agency: NA       HH Arranged: NA           Social Determinants of Health (SDOH) Interventions SDOH Screenings   Food Insecurity: No Food Insecurity (07/26/2023)  Housing: Low Risk  (07/26/2023)  Transportation Needs: No Transportation Needs (07/26/2023)  Utilities: Not At Risk (07/26/2023)  Tobacco Use: Low Risk  (07/25/2023)    Readmission Risk Interventions     No data to display

## 2023-07-27 NOTE — Progress Notes (Signed)
Daily Progress Note   Patient Name: Alicia Vang       Date: 07/27/2023 DOB: 1927-02-13  Age: 89 y.o. MRN#: [id number] Attending Physician: Glade Lloyd, MD Primary Care Physician: Knox Royalty, MD Admit Date: 07/25/2023  Reason for Consultation/Follow-up: Establishing goals of care  Length of Stay: 2  Current Medications: Scheduled Meds:   brinzolamide  1 drop Both Eyes QHS   enoxaparin (LOVENOX) injection  40 mg Subcutaneous Q24H   feeding supplement  237 mL Oral BID BM   [START ON 07/28/2023] furosemide  40 mg Intravenous Daily   [START ON 07/28/2023] influenza vaccine adjuvanted  0.5 mL Intramuscular Tomorrow-1000   insulin aspart  0-15 Units Subcutaneous Q4H   latanoprost  1 drop Both Eyes QHS   losartan  50 mg Oral Daily   metoprolol succinate  50 mg Oral Daily   potassium chloride  40 mEq Oral Once   sodium chloride flush  3 mL Intravenous Q12H   spironolactone  12.5 mg Oral Daily   [START ON 07/29/2023] Vitamin D (Ergocalciferol)  50,000 Units Oral Weekly    Continuous Infusions:  sodium chloride      PRN Meds: sodium chloride, acetaminophen, acetaminophen, hydrALAZINE, ondansetron (ZOFRAN) IV, polyethylene glycol, sodium chloride flush  Physical Exam Vitals reviewed.  Constitutional:      General: She is sleeping.     Appearance: She is underweight.  Cardiovascular:     Rate and Rhythm: Bradycardia present.  Pulmonary:     Effort: Pulmonary effort is normal.  Neurological:     Mental Status: She is easily aroused.             Vital Signs: BP (!) 148/62 (BP Location: Right Arm)   Pulse (!) 56   Temp (!) 97.4 F (36.3 C) (Oral)   Resp (!) 22   Ht 5\' 6"  (1.676 m)   Wt 42.1 kg   SpO2 99%   BMI 14.98 kg/m  SpO2: SpO2: 99 % O2 Device: O2 Device:  Room Air O2 Flow Rate: O2 Flow Rate (L/min): 2 L/min       Palliative Assessment/Data: 40%      Patient Active Problem List   Diagnosis Date Noted   Acute congestive heart failure (HCC) 07/25/2023   Chronic kidney disease, stage 3a (HCC) 07/05/2022   Hypoalbuminemia 07/05/2022  Closed displaced fracture of right femoral neck (HCC) 07/01/2022   Traumatic intraventricular hemorrhage (HCC) 07/01/2022   Closed compression fracture of L3 lumbar vertebra, with delayed healing, subsequent encounter 06/01/2021   Parkinson's disease (HCC) 06/01/2021   Essential hypertension 06/01/2021   COVID-19 virus infection 10/21/2020   Bradycardia 10/21/2020   Type 2 diabetes mellitus without complication, without long-term current use of insulin (HCC) 10/21/2020   Primary osteoarthritis of left hip 10/26/2017   Osteoarthritis of left hip 08/17/2017    Palliative Care Assessment & Plan   Patient Profile: 89 y.o. female  with past medical history of hypertension, Parkinson's disease, and fall with hip surgery (2023) admitted on 07/25/2023 with worsening shortness of breath and lower extremity swelling.    On presentation, BNP was more than 4500. Chest x-ray showed bilateral moderate pleural effusions.  Assessment: Patient is sleeping in bed but awakens, smiles, and waves. Her daughter is at bedside.  Patient had thoracentesis yesterday with 400 ml removed. Patient's daughter reports the patient appears more comfortable since this fluid removal. We discussed the echo results from yesterday. I reviewed my understanding of her Parkinson's, heart failure, and dysphagia including limitations of ongoing interventions and  risk for further decline despite treatment efforts.  The patient's daughter understands the seriousness of her mother's conditions. She also reviewed her mother's advanced care planning documents last night. She is planning to meet with her siblings to discuss the patient's goals of  care. They face treatment option decisions, advanced directive decisions, and anticipatory care needs. She will reach out to PMT to facilitate this meeting.   PMT will continue to follow and will call daughter Sunday 10/6 if she has not scheduled a meeting.  Recommendations/Plan: Full code Full scope Encouraged family to discuss goals of care & verify her wishes in ACP documentation PMT will follow up  Code Status:    Code Status Orders  (From admission, onward)           Start     Ordered   07/25/23 2344  Full code  (Code Status)  Continuous       Question:  By:  Answer:  Consent: discussion documented in EHR   07/25/23 2344        Extensive chart review has been completed prior to seeing the patient and speaking with her daughter including labs, vital signs, imagine, progress/consult notes, orders, medications, and available advance directive documents.  Care plan was discussed with Dr. Hanley Ben  Time spent: 55 minutes   Thank you for allowing the Palliative Medicine Team to assist in the care of this patient.   Sherryll Burger, NP  Please contact Palliative Medicine Team phone at [phone] for questions and concerns.

## 2023-07-27 NOTE — Evaluation (Signed)
Physical Therapy Evaluation Patient Details Name: Alicia Vang MRN: [id number] DOB: 1926-12-16 Today's Date: 07/27/2023  History of Present Illness  89 yo female presents to [facility] on 10/2 with dyspnea, RLE swelling with workup for HF. CXR shows bilat moderate pleural effusions. S/p L thoracentesis on 10/3. PMH includes OA, L THA 2019, DMII, HTN, hypokalemia, parkinsons.  Clinical Impression   Pt presents with generalized weakness, impaired bed mobility and transfer ability, impaired balance, and decreased activity tolerance vs baseline. Pt to benefit from acute PT to address deficits. Pt requiring max +2 assist for bed mobility and transfer into semi-standing position, at baseline pt's daughter who is her caregiver states she is transfer-level multiple times a day prior to admission (from bed to transport chair, transport chair to chair lift, chair lift to stedy, stedy to lift chair, to/from toilet). Pt currently not at that level, Patient will benefit from continued inpatient follow up therapy, <3 hours/day. PT to progress mobility as tolerated, and will continue to follow acutely.          If plan is discharge home, recommend the following: Two people to help with walking and/or transfers;Two people to help with bathing/dressing/bathroom;Direct supervision/assist for medications management;Assistance with cooking/housework;Assistance with feeding;Assist for transportation;Help with stairs or ramp for entrance   Can travel by private vehicle   No    Equipment Recommendations None recommended by PT  Recommendations for Other Services       Functional Status Assessment Patient has had a recent decline in their functional status and demonstrates the ability to make significant improvements in function in a reasonable and predictable amount of time.     Precautions / Restrictions Precautions Precautions: Fall Precaution Comments: skin breakdown L scapula with redness to spinous  processes Restrictions Weight Bearing Restrictions: No      Mobility  Bed Mobility Overal bed mobility: Needs Assistance Bed Mobility: Supine to Sit, Sit to Supine     Supine to sit: Max assist, +2 for physical assistance Sit to supine: Max assist, +2 for physical assistance   General bed mobility comments: all aspects, step-by-step sequencing cues    Transfers Overall transfer level: Needs assistance Equipment used: 2 person hand held assist Transfers: Sit to/from Stand Sit to Stand: Max assist, +2 physical assistance           General transfer comment: max +2 for all aspects, steadying in standing. Pt keeps knees in flexed position and standing tolerance x20 seconds    Ambulation/Gait               General Gait Details: nt  Acupuncturist Bed    Modified Rankin (Stroke Patients Only)       Balance Overall balance assessment: Needs assistance Sitting-balance support: Bilateral upper extremity supported, Feet supported Sitting balance-[name] Scale: Fair Sitting balance - Comments: sits EOB supervision x10 minutes, anterior bias with fatigue   Standing balance support: Bilateral upper extremity supported, During functional activity Standing balance-[name] Scale: Zero Standing balance comment: max +2                             Pertinent Vitals/Pain Pain Assessment Pain Assessment: Faces Faces Pain Scale: Hurts little more Pain Location: generalized Pain Descriptors / Indicators: Discomfort, Guarding Pain Intervention(s): Limited activity within patient's tolerance, Monitored during session, Repositioned    Home Living Family/patient expects to  be discharged to:: Private residence Living Arrangements: Children Available Help at Discharge: Family;Available 24 hours/day Type of Home: House Home Access: Stairs to enter Entrance Stairs-Rails: Left Entrance Stairs-Number of Steps: 3 Alternate Level  Stairs-Number of Steps: flight Home Layout: Two level;Bed/bath upstairs Home Equipment: Rollator (4 wheels);Wheelchair - Careers adviser (comment);Lift chair Additional Comments: stair lift, Steady    Prior Function               Mobility Comments: Steady utilized downstairs, Daughter assists with squat pivot transfers upstairs. Stair lift.       Extremity/Trunk Assessment   Upper Extremity Assessment Upper Extremity Assessment: Defer to OT evaluation    Lower Extremity Assessment Lower Extremity Assessment: Generalized weakness    Cervical / Trunk Assessment Cervical / Trunk Assessment: Kyphotic  Communication   Communication Communication: Hearing impairment Cueing Techniques: Verbal cues;Gestural cues;Tactile cues  Cognition Arousal: Alert Behavior During Therapy: Flat affect Overall Cognitive Status: Difficult to assess                                 General Comments: HOH, limited verbalization. Most successful at yes/no today. Poor initiation and sequencing        General Comments General comments (skin integrity, edema, etc.): vss    Exercises     Assessment/Plan    PT Assessment Patient needs continued PT services  PT Problem List Decreased strength;Decreased mobility;Decreased activity tolerance;Decreased balance;Decreased knowledge of use of DME;Pain;Decreased safety awareness;Decreased knowledge of precautions       PT Treatment Interventions DME instruction;Therapeutic activities;Therapeutic exercise;Patient/family education;Balance training;Functional mobility training;Neuromuscular re-education    PT Goals (Current goals can be found in the Care Plan section)  Acute Rehab PT Goals Patient Stated Goal: return to PLOF, which is transfer-level +1 PT Goal Formulation: With patient/family Time For Goal Achievement: 08/10/23 Potential to Achieve Goals: Good    Frequency Min 1X/week     Co-evaluation PT/OT/SLP  Co-Evaluation/Treatment: Yes Reason for Co-Treatment: For patient/therapist safety;Complexity of the patient's impairments (multi-system involvement);To address functional/ADL transfers PT goals addressed during session: Mobility/safety with mobility;Balance         AM-PAC PT "6 Clicks" Mobility  Outcome Measure Help needed turning from your back to your side while in a flat bed without using bedrails?: A Lot Help needed moving from lying on your back to sitting on the side of a flat bed without using bedrails?: A Lot Help needed moving to and from a bed to a chair (including a wheelchair)?: Total Help needed standing up from a chair using your arms (e.g., wheelchair or bedside chair)?: Total Help needed to walk in hospital room?: Total Help needed climbing 3-5 steps with a railing? : Total 6 Click Score: 8    End of Session   Activity Tolerance: Patient tolerated treatment well;Patient limited by fatigue Patient left: in bed;with call bell/phone within reach;with family/visitor present;with bed alarm set Nurse Communication: Mobility status;Other (comment) (skin breakdown L shoulder blade, encouraged frequent pressure relief while acute) PT Visit Diagnosis: Other abnormalities of gait and mobility (R26.89);Muscle weakness (generalized) (M62.81)    Time: 2951-8841 PT Time Calculation (min) (ACUTE ONLY): 27 min   Charges:   PT Evaluation $PT Eval Low Complexity: 1 Low   PT General Charges $$ ACUTE PT VISIT: 1 Visit         Marye Round, PT DPT Acute Rehabilitation Services Secure Chat Preferred  Office [phone]   Quindell Shere E Christain [place] 07/27/2023, 11:42 AM

## 2023-07-27 NOTE — Progress Notes (Signed)
and complications were also discussed. The patient understands and wishes to proceed with the procedure. Written consent was obtained. Ultrasound was performed to localize and mark an adequate pocket of fluid in the left chest. The area was then prepped and draped in the normal sterile fashion. 1% Lidocaine was used for local anesthesia. Under ultrasound guidance a 6 Fr Safe-T-Centesis catheter was introduced. Thoracentesis was performed. The catheter was removed and a dressing applied. FINDINGS: A total of approximately 400 mL of clear yellow fluid was removed. Samples were sent to the laboratory as requested by the clinical team. IMPRESSION: Successful ultrasound guided LEFT thoracentesis yielding 400 mL of pleural fluid. Procedure performed by: Corrin Parker, PA-C Electronically Signed   By: Roanna Banning M.D.   On: 07/26/2023 15:58   DG Chest Portable 1 View  Result Date: 07/25/2023 CLINICAL DATA:  Shortness of breath, hypoxia EXAM: PORTABLE CHEST 1 VIEW COMPARISON:  07/01/2022 FINDINGS: Moderate bilateral pleural effusions. Bibasilar atelectasis. Heart mediastinal contours within  normal limits. Aortic atherosclerosis. IMPRESSION: Moderate bilateral pleural effusions with bibasilar atelectasis. Electronically Signed   By: Charlett Nose M.D.   On: 07/25/2023 19:12   VAS [place] LOWER EXTREMITY VENOUS (DVT) (7a-7p)  Result Date: 07/25/2023  Lower Venous DVT Study Patient Name:  Alicia Vang  Date of Exam:   07/25/2023 Medical Rec #: [id number]            Accession #:    [id number] Date of Birth: 01-29-1927           Patient Gender: F Patient Age:   57 years Exam Location:  [facility] Procedure:      VAS [place] LOWER EXTREMITY VENOUS (DVT) Referring Phys: Griffin Basil ROEMHILDT --------------------------------------------------------------------------------  Indications: Swelling.  Risk Factors: None identified. Limitations: Poor ultrasound/tissue interface and patient positioning. Comparison Study: No prior studies. Performing Technologist: Chanda Busing RVT  Examination Guidelines: A complete evaluation includes B-mode imaging, spectral Doppler, color Doppler, and power Doppler as needed of all accessible portions of each vessel. Bilateral testing is considered an integral part of a complete examination. Limited examinations for reoccurring indications may be performed as noted. The reflux portion of the exam is performed with the patient in reverse Trendelenburg.  +---------+---------------+---------+-----------+----------+--------------+ RIGHT    CompressibilityPhasicitySpontaneityPropertiesThrombus Aging +---------+---------------+---------+-----------+----------+--------------+ CFV      Full           Yes      No                                  +---------+---------------+---------+-----------+----------+--------------+ SFJ      Full                                                        +---------+---------------+---------+-----------+----------+--------------+ FV Prox  Full                                                         +---------+---------------+---------+-----------+----------+--------------+ FV Mid   Full                                                        +---------+---------------+---------+-----------+----------+--------------+  HGB 11.2* 12.0  HCT 35.8* 37.2  MCV 95.2 90.5  MCH 29.8 29.2  MCHC 31.3 32.3  RDW 16.0* 15.9*  PLT 240 245   Thyroid No results for input(s): "TSH", "FREET4" in the last 168 hours.  BNP Recent Labs  Lab 07/25/23 2108  BNP >4,500.0*    DDimer No results for input(s): "DDIMER" in the last 168 hours.   Radiology    ECHOCARDIOGRAM COMPLETE  Result Date: 07/26/2023    ECHOCARDIOGRAM REPORT   Patient Name:   Alicia Vang Date of Exam: 07/26/2023 Medical Rec #:  [id number]           Height:       66.0 in Accession #:    [id number]          Weight:       89.5 lb Date of Birth:  Mar 01, 1927          BSA:          1.421 m Patient Age:    89 years            BP:           163/83 mmHg Patient Gender: F                   HR:           54 bpm. Exam Location:  Inpatient Procedure: 2D Echo, Cardiac Doppler and Color Doppler Indications:    CHF  History:        Patient has no prior history of Echocardiogram examinations.                 Arrythmias:Atrial Fibrillation, Signs/Symptoms:Edema and                 Shortness of Breath; Risk Factors:Hypertension and Diabetes.                  Parkinson's.  Sonographer:    Milda Smart Referring Phys: [phone] DEBBY CROSLEY  Sonographer Comments: Image acquisition challenging due to patient body habitus and Image acquisition challenging due to respiratory motion. IMPRESSIONS  1. Left ventricular ejection fraction, by estimation, is 25 to 30%. The left ventricle has severely decreased function. The left ventricle demonstrates global hypokinesis. There is severe concentric left ventricular hypertrophy. Elevated left atrial pressure. 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  2. Right ventricular systolic function is mildly reduced. The right ventricular size is normal. Mildly increased right ventricular wall thickness.  3. Left atrial size was mildly dilated.  4. Right atrial size was mildly dilated.  5. Large pleural effusion.  6. The mitral valve is normal in structure. Mild to moderate mitral valve regurgitation.  7. The aortic valve is normal in structure. Aortic valve regurgitation is mild. FINDINGS  Left Ventricle: Left ventricular ejection fraction, by estimation, is 25 to 30%. The left ventricle has severely decreased function. The left ventricle demonstrates global hypokinesis. The left ventricular internal cavity size was normal in size. There is severe concentric left ventricular hypertrophy. Left ventricular diastolic function could not be evaluated due to atrial fibrillation. Elevated left atrial pressure. Right Ventricle: The right ventricular size is normal. Mildly increased right ventricular wall thickness. Right ventricular systolic function is mildly reduced. Left Atrium: Left atrial size was mildly dilated. Right Atrium: Right atrial size was mildly dilated. Pericardium: There is no evidence of pericardial effusion. Mitral Valve: The mitral valve is normal in structure. Mild to moderate mitral valve regurgitation. MV peak gradient,  and complications were also discussed. The patient understands and wishes to proceed with the procedure. Written consent was obtained. Ultrasound was performed to localize and mark an adequate pocket of fluid in the left chest. The area was then prepped and draped in the normal sterile fashion. 1% Lidocaine was used for local anesthesia. Under ultrasound guidance a 6 Fr Safe-T-Centesis catheter was introduced. Thoracentesis was performed. The catheter was removed and a dressing applied. FINDINGS: A total of approximately 400 mL of clear yellow fluid was removed. Samples were sent to the laboratory as requested by the clinical team. IMPRESSION: Successful ultrasound guided LEFT thoracentesis yielding 400 mL of pleural fluid. Procedure performed by: Corrin Parker, PA-C Electronically Signed   By: Roanna Banning M.D.   On: 07/26/2023 15:58   DG Chest Portable 1 View  Result Date: 07/25/2023 CLINICAL DATA:  Shortness of breath, hypoxia EXAM: PORTABLE CHEST 1 VIEW COMPARISON:  07/01/2022 FINDINGS: Moderate bilateral pleural effusions. Bibasilar atelectasis. Heart mediastinal contours within  normal limits. Aortic atherosclerosis. IMPRESSION: Moderate bilateral pleural effusions with bibasilar atelectasis. Electronically Signed   By: Charlett Nose M.D.   On: 07/25/2023 19:12   VAS [place] LOWER EXTREMITY VENOUS (DVT) (7a-7p)  Result Date: 07/25/2023  Lower Venous DVT Study Patient Name:  Alicia Vang  Date of Exam:   07/25/2023 Medical Rec #: [id number]            Accession #:    [id number] Date of Birth: 01-29-1927           Patient Gender: F Patient Age:   57 years Exam Location:  [facility] Procedure:      VAS [place] LOWER EXTREMITY VENOUS (DVT) Referring Phys: Griffin Basil ROEMHILDT --------------------------------------------------------------------------------  Indications: Swelling.  Risk Factors: None identified. Limitations: Poor ultrasound/tissue interface and patient positioning. Comparison Study: No prior studies. Performing Technologist: Chanda Busing RVT  Examination Guidelines: A complete evaluation includes B-mode imaging, spectral Doppler, color Doppler, and power Doppler as needed of all accessible portions of each vessel. Bilateral testing is considered an integral part of a complete examination. Limited examinations for reoccurring indications may be performed as noted. The reflux portion of the exam is performed with the patient in reverse Trendelenburg.  +---------+---------------+---------+-----------+----------+--------------+ RIGHT    CompressibilityPhasicitySpontaneityPropertiesThrombus Aging +---------+---------------+---------+-----------+----------+--------------+ CFV      Full           Yes      No                                  +---------+---------------+---------+-----------+----------+--------------+ SFJ      Full                                                        +---------+---------------+---------+-----------+----------+--------------+ FV Prox  Full                                                         +---------+---------------+---------+-----------+----------+--------------+ FV Mid   Full                                                        +---------+---------------+---------+-----------+----------+--------------+  and complications were also discussed. The patient understands and wishes to proceed with the procedure. Written consent was obtained. Ultrasound was performed to localize and mark an adequate pocket of fluid in the left chest. The area was then prepped and draped in the normal sterile fashion. 1% Lidocaine was used for local anesthesia. Under ultrasound guidance a 6 Fr Safe-T-Centesis catheter was introduced. Thoracentesis was performed. The catheter was removed and a dressing applied. FINDINGS: A total of approximately 400 mL of clear yellow fluid was removed. Samples were sent to the laboratory as requested by the clinical team. IMPRESSION: Successful ultrasound guided LEFT thoracentesis yielding 400 mL of pleural fluid. Procedure performed by: Corrin Parker, PA-C Electronically Signed   By: Roanna Banning M.D.   On: 07/26/2023 15:58   DG Chest Portable 1 View  Result Date: 07/25/2023 CLINICAL DATA:  Shortness of breath, hypoxia EXAM: PORTABLE CHEST 1 VIEW COMPARISON:  07/01/2022 FINDINGS: Moderate bilateral pleural effusions. Bibasilar atelectasis. Heart mediastinal contours within  normal limits. Aortic atherosclerosis. IMPRESSION: Moderate bilateral pleural effusions with bibasilar atelectasis. Electronically Signed   By: Charlett Nose M.D.   On: 07/25/2023 19:12   VAS [place] LOWER EXTREMITY VENOUS (DVT) (7a-7p)  Result Date: 07/25/2023  Lower Venous DVT Study Patient Name:  Alicia Vang  Date of Exam:   07/25/2023 Medical Rec #: [id number]            Accession #:    [id number] Date of Birth: 01-29-1927           Patient Gender: F Patient Age:   57 years Exam Location:  [facility] Procedure:      VAS [place] LOWER EXTREMITY VENOUS (DVT) Referring Phys: Griffin Basil ROEMHILDT --------------------------------------------------------------------------------  Indications: Swelling.  Risk Factors: None identified. Limitations: Poor ultrasound/tissue interface and patient positioning. Comparison Study: No prior studies. Performing Technologist: Chanda Busing RVT  Examination Guidelines: A complete evaluation includes B-mode imaging, spectral Doppler, color Doppler, and power Doppler as needed of all accessible portions of each vessel. Bilateral testing is considered an integral part of a complete examination. Limited examinations for reoccurring indications may be performed as noted. The reflux portion of the exam is performed with the patient in reverse Trendelenburg.  +---------+---------------+---------+-----------+----------+--------------+ RIGHT    CompressibilityPhasicitySpontaneityPropertiesThrombus Aging +---------+---------------+---------+-----------+----------+--------------+ CFV      Full           Yes      No                                  +---------+---------------+---------+-----------+----------+--------------+ SFJ      Full                                                        +---------+---------------+---------+-----------+----------+--------------+ FV Prox  Full                                                         +---------+---------------+---------+-----------+----------+--------------+ FV Mid   Full                                                        +---------+---------------+---------+-----------+----------+--------------+  HGB 11.2* 12.0  HCT 35.8* 37.2  MCV 95.2 90.5  MCH 29.8 29.2  MCHC 31.3 32.3  RDW 16.0* 15.9*  PLT 240 245   Thyroid No results for input(s): "TSH", "FREET4" in the last 168 hours.  BNP Recent Labs  Lab 07/25/23 2108  BNP >4,500.0*    DDimer No results for input(s): "DDIMER" in the last 168 hours.   Radiology    ECHOCARDIOGRAM COMPLETE  Result Date: 07/26/2023    ECHOCARDIOGRAM REPORT   Patient Name:   Alicia Vang Date of Exam: 07/26/2023 Medical Rec #:  [id number]           Height:       66.0 in Accession #:    [id number]          Weight:       89.5 lb Date of Birth:  Mar 01, 1927          BSA:          1.421 m Patient Age:    89 years            BP:           163/83 mmHg Patient Gender: F                   HR:           54 bpm. Exam Location:  Inpatient Procedure: 2D Echo, Cardiac Doppler and Color Doppler Indications:    CHF  History:        Patient has no prior history of Echocardiogram examinations.                 Arrythmias:Atrial Fibrillation, Signs/Symptoms:Edema and                 Shortness of Breath; Risk Factors:Hypertension and Diabetes.                  Parkinson's.  Sonographer:    Milda Smart Referring Phys: [phone] DEBBY CROSLEY  Sonographer Comments: Image acquisition challenging due to patient body habitus and Image acquisition challenging due to respiratory motion. IMPRESSIONS  1. Left ventricular ejection fraction, by estimation, is 25 to 30%. The left ventricle has severely decreased function. The left ventricle demonstrates global hypokinesis. There is severe concentric left ventricular hypertrophy. Elevated left atrial pressure. 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  2. Right ventricular systolic function is mildly reduced. The right ventricular size is normal. Mildly increased right ventricular wall thickness.  3. Left atrial size was mildly dilated.  4. Right atrial size was mildly dilated.  5. Large pleural effusion.  6. The mitral valve is normal in structure. Mild to moderate mitral valve regurgitation.  7. The aortic valve is normal in structure. Aortic valve regurgitation is mild. FINDINGS  Left Ventricle: Left ventricular ejection fraction, by estimation, is 25 to 30%. The left ventricle has severely decreased function. The left ventricle demonstrates global hypokinesis. The left ventricular internal cavity size was normal in size. There is severe concentric left ventricular hypertrophy. Left ventricular diastolic function could not be evaluated due to atrial fibrillation. Elevated left atrial pressure. Right Ventricle: The right ventricular size is normal. Mildly increased right ventricular wall thickness. Right ventricular systolic function is mildly reduced. Left Atrium: Left atrial size was mildly dilated. Right Atrium: Right atrial size was mildly dilated. Pericardium: There is no evidence of pericardial effusion. Mitral Valve: The mitral valve is normal in structure. Mild to moderate mitral valve regurgitation. MV peak gradient,  and complications were also discussed. The patient understands and wishes to proceed with the procedure. Written consent was obtained. Ultrasound was performed to localize and mark an adequate pocket of fluid in the left chest. The area was then prepped and draped in the normal sterile fashion. 1% Lidocaine was used for local anesthesia. Under ultrasound guidance a 6 Fr Safe-T-Centesis catheter was introduced. Thoracentesis was performed. The catheter was removed and a dressing applied. FINDINGS: A total of approximately 400 mL of clear yellow fluid was removed. Samples were sent to the laboratory as requested by the clinical team. IMPRESSION: Successful ultrasound guided LEFT thoracentesis yielding 400 mL of pleural fluid. Procedure performed by: Corrin Parker, PA-C Electronically Signed   By: Roanna Banning M.D.   On: 07/26/2023 15:58   DG Chest Portable 1 View  Result Date: 07/25/2023 CLINICAL DATA:  Shortness of breath, hypoxia EXAM: PORTABLE CHEST 1 VIEW COMPARISON:  07/01/2022 FINDINGS: Moderate bilateral pleural effusions. Bibasilar atelectasis. Heart mediastinal contours within  normal limits. Aortic atherosclerosis. IMPRESSION: Moderate bilateral pleural effusions with bibasilar atelectasis. Electronically Signed   By: Charlett Nose M.D.   On: 07/25/2023 19:12   VAS [place] LOWER EXTREMITY VENOUS (DVT) (7a-7p)  Result Date: 07/25/2023  Lower Venous DVT Study Patient Name:  Alicia Vang  Date of Exam:   07/25/2023 Medical Rec #: [id number]            Accession #:    [id number] Date of Birth: 01-29-1927           Patient Gender: F Patient Age:   57 years Exam Location:  [facility] Procedure:      VAS [place] LOWER EXTREMITY VENOUS (DVT) Referring Phys: Griffin Basil ROEMHILDT --------------------------------------------------------------------------------  Indications: Swelling.  Risk Factors: None identified. Limitations: Poor ultrasound/tissue interface and patient positioning. Comparison Study: No prior studies. Performing Technologist: Chanda Busing RVT  Examination Guidelines: A complete evaluation includes B-mode imaging, spectral Doppler, color Doppler, and power Doppler as needed of all accessible portions of each vessel. Bilateral testing is considered an integral part of a complete examination. Limited examinations for reoccurring indications may be performed as noted. The reflux portion of the exam is performed with the patient in reverse Trendelenburg.  +---------+---------------+---------+-----------+----------+--------------+ RIGHT    CompressibilityPhasicitySpontaneityPropertiesThrombus Aging +---------+---------------+---------+-----------+----------+--------------+ CFV      Full           Yes      No                                  +---------+---------------+---------+-----------+----------+--------------+ SFJ      Full                                                        +---------+---------------+---------+-----------+----------+--------------+ FV Prox  Full                                                         +---------+---------------+---------+-----------+----------+--------------+ FV Mid   Full                                                        +---------+---------------+---------+-----------+----------+--------------+

## 2023-07-27 NOTE — Discharge Instructions (Signed)

## 2023-07-27 NOTE — Evaluation (Signed)
Occupational Therapy Evaluation Patient Details Name: Alicia Vang MRN: [id number] DOB: 19-Apr-1927 Today's Date: 07/27/2023   History of Present Illness 89 yo female presents to [facility] on 10/2 with dyspnea, RLE swelling with workup for HF. CXR shows bilat moderate pleural effusions. S/p L thoracentesis on 10/3. PMH includes OA, L THA 2019, DMII, HTN, hypokalemia, parkinsons.   Clinical Impression   Pt admitted with the above diagnoses and presents with below problem list. Pt will benefit from continued acute OT to address the below listed deficits and maximize independence with basic ADLs prior to d/c. At baseline, pt lives with her daughter who is her primary caregiver. Daughter reports pt OOB throughout the day utilizing Steady vs squat pivot transfer at +1 assist level. Daughter has equipped house with DME needed for pt transfers and ADLs. Pt is able to sit EOB with BUE support for several minutes at supervision/min guard level but does show signs of fatigue. Pt currently needs +2 assist for bed mobility and functional transfers. Daughter present throughout session.         If plan is discharge home, recommend the following:      Functional Status Assessment  Patient has had a recent decline in their functional status and demonstrates the ability to make significant improvements in function in a reasonable and predictable amount of time.  Equipment Recommendations  Other (comment) (defer to next venue)    Recommendations for Other Services       Precautions / Restrictions Precautions Precautions: Fall Precaution Comments: skin breakdown L scapula with redness to spinous processes Restrictions Weight Bearing Restrictions: No      Mobility Bed Mobility Overal bed mobility: Needs Assistance Bed Mobility: Supine to Sit, Sit to Supine     Supine to sit: Max assist, +2 for physical assistance Sit to supine: Max assist, +2 for physical assistance   General bed mobility  comments: all aspects, step-by-step sequencing cues    Transfers Overall transfer level: Needs assistance Equipment used: 2 person hand held assist Transfers: Sit to/from Stand Sit to Stand: Max assist, +2 physical assistance           General transfer comment: max +2 for all aspects, steadying in standing. Pt keeps knees in flexed position and standing tolerance x20 seconds      Balance Overall balance assessment: Needs assistance Sitting-balance support: Bilateral upper extremity supported, Feet supported Sitting balance-[name] Scale: Fair Sitting balance - Comments: sits EOB supervision x10 minutes, anterior bias with fatigue   Standing balance support: Bilateral upper extremity supported, During functional activity Standing balance-[name] Scale: Zero Standing balance comment: max +2                           ADL either performed or assessed with clinical judgement   ADL Overall ADL's : Needs assistance/impaired Eating/Feeding: Set up;Supervision/ safety   Grooming: Maximal assistance;Total assistance;Sitting Grooming Details (indicate cue type and reason): able to sit EOB with BUE support, up to total A for ADLs in part d/t cognition. Upper Body Bathing: Total assistance;Sitting   Lower Body Bathing: Maximal assistance;Total assistance;+2 for physical assistance;Sit to/from stand   Upper Body Dressing : Maximal assistance;Total assistance;Sitting   Lower Body Dressing: Maximal assistance;Total assistance;+2 for physical assistance;Sit to/from stand                 General ADL Comments: able to sit EOB with BUE support, supervision to CGA assist level for several minutes. up to total  A for ADLs in part d/t cognition.     Vision         Perception         Praxis         Pertinent Vitals/Pain Pain Assessment Pain Assessment: Faces Faces Pain Scale: Hurts little more Pain Location: generalized Pain Descriptors / Indicators: Discomfort,  Guarding Pain Intervention(s): Limited activity within patient's tolerance, Monitored during session, Repositioned     Extremity/Trunk Assessment Upper Extremity Assessment Upper Extremity Assessment: Generalized weakness;Difficult to assess due to impaired cognition   Lower Extremity Assessment Lower Extremity Assessment: Defer to PT evaluation   Cervical / Trunk Assessment Cervical / Trunk Assessment: Kyphotic   Communication Communication Communication: Hearing impairment Cueing Techniques: Verbal cues;Gestural cues;Tactile cues   Cognition Arousal: Alert Behavior During Therapy: Flat affect Overall Cognitive Status: Difficult to assess                                 General Comments: HOH, limited verbalization. Most successful at yes/no today. Poor initiation and sequencing     General Comments  vss    Exercises     Shoulder Instructions      Home Living Family/patient expects to be discharged to:: Private residence Living Arrangements: Children Available Help at Discharge: Family;Available 24 hours/day Type of Home: House Home Access: Stairs to enter [employer] of Steps: 3 Entrance Stairs-Rails: Left Home Layout: Two level;Bed/bath upstairs Alternate Level Stairs-Number of Steps: flight   Bathroom Shower/Tub: Tub/shower unit         Home Equipment: Rollator (4 wheels);Wheelchair - Careers adviser (comment);Lift chair   Additional Comments: stair lift, Steady      Prior Functioning/Environment Prior Level of Function : Needs assist  Cognitive Assist : ADLs (cognitive);Mobility (cognitive)           Mobility Comments: Steady utilized downstairs, Daughter assists with squat pivot transfers upstairs. Stair lift. ADLs Comments: daughter assists with all ADLs. Pt able to feed self with extra time. Pt able to sit EOB at supervision/CGA level.        OT Problem List: Decreased strength;Impaired balance (sitting and/or  standing);Decreased activity tolerance;Decreased cognition;Decreased safety awareness;Decreased knowledge of use of DME or AE;Decreased knowledge of precautions;Pain      OT Treatment/Interventions: Self-care/ADL training;Therapeutic exercise;Neuromuscular education;Energy conservation;DME and/or AE instruction;Therapeutic activities;Patient/family education;Balance training    OT Goals(Current goals can be found in the care plan section) Acute Rehab OT Goals Patient Stated Goal: pt unable to state. Daughter (primary caregiver) would like pt to go for ST SN[facility] to work on getting back to baseline with transfers while daughter also works on getting home setup fo pt's potential needs in the future. OT Goal Formulation: With family Time For Goal Achievement: 08/10/23 Potential to Achieve Goals: Fair ADL Goals Pt Will Perform Upper Body Bathing: with max assist;sitting Pt Will Perform Lower Body Bathing: with max assist;sitting/lateral leans;sit to/from stand Additional ADL Goal #1: Pt will complete bed mobility at max A +1 level to prepare for EOB/OOB ADLs.  OT Frequency: Min 2X/week    Co-evaluation PT/OT/SLP Co-Evaluation/Treatment: Yes Reason for Co-Treatment: For patient/therapist safety;Complexity of the patient's impairments (multi-system involvement);To address functional/ADL transfers PT goals addressed during session: Mobility/safety with mobility;Balance OT goals addressed during session: ADL's and self-care      AM-PAC OT "6 Clicks" Daily Activity     Outcome Measure Help from another person eating meals?: A Little Help from another person taking care  of personal grooming?: A Lot Help from another person toileting, which includes using toliet, bedpan, or urinal?: Total Help from another person bathing (including washing, rinsing, drying)?: Total Help from another person to put on and taking off regular upper body clothing?: A Lot Help from another person to put on and taking off  regular lower body clothing?: Total 6 Click Score: 10   End of Session    Activity Tolerance: Patient tolerated treatment well Patient left: in bed;with call bell/phone within reach;with bed alarm set;with family/visitor present  OT Visit Diagnosis: Unsteadiness on feet (R26.81);History of falling (Z91.81);Muscle weakness (generalized) (M62.81);Other symptoms and signs involving cognitive function;Other symptoms and signs involving the nervous system (R29.898);Cognitive communication deficit (R41.841);Pain                Time: 1610-9604 OT Time Calculation (min): 29 min Charges:  OT General Charges $OT Visit: 1 Visit OT Evaluation $OT Eval Moderate Complexity: 1 Mod  Raynald Kemp, OT Acute Rehabilitation Services Office: [phone]   Pilar Grammes 07/27/2023, 11:59 AM

## 2023-07-27 NOTE — Progress Notes (Addendum)
unless the authorization is terminated or revoked.  Performed at [facility], [address], [place], [place] [address]          Radiology Studies: ECHOCARDIOGRAM COMPLETE  Result Date: 07/26/2023    ECHOCARDIOGRAM REPORT   Patient Name:   Alicia Vang Date of Exam: 07/26/2023 Medical Rec #:  [id number]           Height:       66.0 in Accession #:    [id number]          Weight:       89.5 lb Date of Birth:  Feb 15, 1927          BSA:          1.421 m Patient Age:     89 years            BP:           163/83 mmHg Patient Gender: F                   HR:           54 bpm. Exam Location:  Inpatient Procedure: 2D Echo, Cardiac Doppler and Color Doppler Indications:    CHF  History:        Patient has no prior history of Echocardiogram examinations.                 Arrythmias:Atrial Fibrillation, Signs/Symptoms:Edema and                 Shortness of Breath; Risk Factors:Hypertension and Diabetes.                 Parkinson's.  Sonographer:    Milda Smart Referring Phys: [phone] DEBBY CROSLEY  Sonographer Comments: Image acquisition challenging due to patient body habitus and Image acquisition challenging due to respiratory motion. IMPRESSIONS  1. Left ventricular ejection fraction, by estimation, is 25 to 30%. The left ventricle has severely decreased function. The left ventricle demonstrates global hypokinesis. There is severe concentric left ventricular hypertrophy. Elevated left atrial pressure. 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  2. Right ventricular systolic function is mildly reduced. The right ventricular size is normal. Mildly increased right ventricular wall thickness.  3. Left atrial size was mildly dilated.  4. Right atrial size was mildly dilated.  5. Large pleural effusion.  6. The mitral valve is normal in structure. Mild to moderate mitral valve regurgitation.  7. The aortic valve is normal in structure. Aortic valve regurgitation is mild. FINDINGS  Left Ventricle: Left ventricular ejection fraction, by estimation, is 25 to 30%. The left ventricle has severely decreased function. The left ventricle demonstrates global hypokinesis. The left ventricular internal cavity size was normal in size. There is severe concentric left ventricular hypertrophy. Left ventricular diastolic function could not be evaluated due to atrial fibrillation. Elevated left atrial pressure. Right Ventricle: The right ventricular size is normal. Mildly  increased right ventricular wall thickness. Right ventricular systolic function is mildly reduced. Left Atrium: Left atrial size was mildly dilated. Right Atrium: Right atrial size was mildly dilated. Pericardium: There is no evidence of pericardial effusion. Mitral Valve: The mitral valve is normal in structure. Mild to moderate mitral valve regurgitation. MV peak gradient, 5.7 mmHg. The mean mitral valve gradient is 2.0 mmHg. Tricuspid Valve: The tricuspid valve is normal in structure. Tricuspid valve regurgitation is trivial. Aortic Valve: The aortic valve is normal in structure. Aortic valve regurgitation is mild. Pulmonic  and draped in the normal sterile fashion. 1% Lidocaine was used for local anesthesia. Under ultrasound guidance a 6 Fr Safe-T-Centesis catheter was introduced. Thoracentesis was performed. The catheter was removed and a dressing applied. FINDINGS: A total of approximately 400 mL of clear yellow fluid was removed. Samples were sent to the laboratory as requested by the clinical team.  IMPRESSION: Successful ultrasound guided LEFT thoracentesis yielding 400 mL of pleural fluid. Procedure performed by: Corrin Parker, PA-C Electronically Signed   By: Roanna Banning M.D.   On: 07/26/2023 15:58   DG Chest Portable 1 View  Result Date: 07/25/2023 CLINICAL DATA:  Shortness of breath, hypoxia EXAM: PORTABLE CHEST 1 VIEW COMPARISON:  07/01/2022 FINDINGS: Moderate bilateral pleural effusions. Bibasilar atelectasis. Heart mediastinal contours within normal limits. Aortic atherosclerosis. IMPRESSION: Moderate bilateral pleural effusions with bibasilar atelectasis. Electronically Signed   By: Charlett Nose M.D.   On: 07/25/2023 19:12   VAS [place] LOWER EXTREMITY VENOUS (DVT) (7a-7p)  Result Date: 07/25/2023  Lower Venous DVT Study Patient Name:  Alicia Vang  Date of Exam:   07/25/2023 Medical Rec #: [id number]            Accession #:    [id number] Date of Birth: 1926-11-12           Patient Gender: F Patient Age:   33 years Exam Location:  [facility] Procedure:      VAS [place] LOWER EXTREMITY VENOUS (DVT) Referring Phys: Griffin Basil ROEMHILDT --------------------------------------------------------------------------------  Indications: Swelling.  Risk Factors: None identified. Limitations: Poor ultrasound/tissue interface and patient positioning. Comparison Study: No prior studies. Performing Technologist: Chanda Busing RVT  Examination Guidelines: A complete evaluation includes B-mode imaging, spectral Doppler, color Doppler, and power Doppler as needed of all accessible portions of each vessel. Bilateral testing is considered an integral part of a complete examination. Limited examinations for reoccurring indications may be performed as noted. The reflux portion of the exam is performed with the patient in reverse Trendelenburg.  +---------+---------------+---------+-----------+----------+--------------+ RIGHT    CompressibilityPhasicitySpontaneityPropertiesThrombus Aging  +---------+---------------+---------+-----------+----------+--------------+ CFV      Full           Yes      No                                  +---------+---------------+---------+-----------+----------+--------------+ SFJ      Full                                                        +---------+---------------+---------+-----------+----------+--------------+ FV Prox  Full                                                        +---------+---------------+---------+-----------+----------+--------------+ FV Mid   Full                                                        +---------+---------------+---------+-----------+----------+--------------+ FV DistalFull                                                        +---------+---------------+---------+-----------+----------+--------------+  unless the authorization is terminated or revoked.  Performed at [facility], [address], [place], [place] [address]          Radiology Studies: ECHOCARDIOGRAM COMPLETE  Result Date: 07/26/2023    ECHOCARDIOGRAM REPORT   Patient Name:   Alicia Vang Date of Exam: 07/26/2023 Medical Rec #:  [id number]           Height:       66.0 in Accession #:    [id number]          Weight:       89.5 lb Date of Birth:  Feb 15, 1927          BSA:          1.421 m Patient Age:     89 years            BP:           163/83 mmHg Patient Gender: F                   HR:           54 bpm. Exam Location:  Inpatient Procedure: 2D Echo, Cardiac Doppler and Color Doppler Indications:    CHF  History:        Patient has no prior history of Echocardiogram examinations.                 Arrythmias:Atrial Fibrillation, Signs/Symptoms:Edema and                 Shortness of Breath; Risk Factors:Hypertension and Diabetes.                 Parkinson's.  Sonographer:    Milda Smart Referring Phys: [phone] DEBBY CROSLEY  Sonographer Comments: Image acquisition challenging due to patient body habitus and Image acquisition challenging due to respiratory motion. IMPRESSIONS  1. Left ventricular ejection fraction, by estimation, is 25 to 30%. The left ventricle has severely decreased function. The left ventricle demonstrates global hypokinesis. There is severe concentric left ventricular hypertrophy. Elevated left atrial pressure. 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  2. Right ventricular systolic function is mildly reduced. The right ventricular size is normal. Mildly increased right ventricular wall thickness.  3. Left atrial size was mildly dilated.  4. Right atrial size was mildly dilated.  5. Large pleural effusion.  6. The mitral valve is normal in structure. Mild to moderate mitral valve regurgitation.  7. The aortic valve is normal in structure. Aortic valve regurgitation is mild. FINDINGS  Left Ventricle: Left ventricular ejection fraction, by estimation, is 25 to 30%. The left ventricle has severely decreased function. The left ventricle demonstrates global hypokinesis. The left ventricular internal cavity size was normal in size. There is severe concentric left ventricular hypertrophy. Left ventricular diastolic function could not be evaluated due to atrial fibrillation. Elevated left atrial pressure. Right Ventricle: The right ventricular size is normal. Mildly  increased right ventricular wall thickness. Right ventricular systolic function is mildly reduced. Left Atrium: Left atrial size was mildly dilated. Right Atrium: Right atrial size was mildly dilated. Pericardium: There is no evidence of pericardial effusion. Mitral Valve: The mitral valve is normal in structure. Mild to moderate mitral valve regurgitation. MV peak gradient, 5.7 mmHg. The mean mitral valve gradient is 2.0 mmHg. Tricuspid Valve: The tricuspid valve is normal in structure. Tricuspid valve regurgitation is trivial. Aortic Valve: The aortic valve is normal in structure. Aortic valve regurgitation is mild. Pulmonic  unless the authorization is terminated or revoked.  Performed at [facility], [address], [place], [place] [address]          Radiology Studies: ECHOCARDIOGRAM COMPLETE  Result Date: 07/26/2023    ECHOCARDIOGRAM REPORT   Patient Name:   Alicia Vang Date of Exam: 07/26/2023 Medical Rec #:  [id number]           Height:       66.0 in Accession #:    [id number]          Weight:       89.5 lb Date of Birth:  Feb 15, 1927          BSA:          1.421 m Patient Age:     89 years            BP:           163/83 mmHg Patient Gender: F                   HR:           54 bpm. Exam Location:  Inpatient Procedure: 2D Echo, Cardiac Doppler and Color Doppler Indications:    CHF  History:        Patient has no prior history of Echocardiogram examinations.                 Arrythmias:Atrial Fibrillation, Signs/Symptoms:Edema and                 Shortness of Breath; Risk Factors:Hypertension and Diabetes.                 Parkinson's.  Sonographer:    Milda Smart Referring Phys: [phone] DEBBY CROSLEY  Sonographer Comments: Image acquisition challenging due to patient body habitus and Image acquisition challenging due to respiratory motion. IMPRESSIONS  1. Left ventricular ejection fraction, by estimation, is 25 to 30%. The left ventricle has severely decreased function. The left ventricle demonstrates global hypokinesis. There is severe concentric left ventricular hypertrophy. Elevated left atrial pressure. 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  2. Right ventricular systolic function is mildly reduced. The right ventricular size is normal. Mildly increased right ventricular wall thickness.  3. Left atrial size was mildly dilated.  4. Right atrial size was mildly dilated.  5. Large pleural effusion.  6. The mitral valve is normal in structure. Mild to moderate mitral valve regurgitation.  7. The aortic valve is normal in structure. Aortic valve regurgitation is mild. FINDINGS  Left Ventricle: Left ventricular ejection fraction, by estimation, is 25 to 30%. The left ventricle has severely decreased function. The left ventricle demonstrates global hypokinesis. The left ventricular internal cavity size was normal in size. There is severe concentric left ventricular hypertrophy. Left ventricular diastolic function could not be evaluated due to atrial fibrillation. Elevated left atrial pressure. Right Ventricle: The right ventricular size is normal. Mildly  increased right ventricular wall thickness. Right ventricular systolic function is mildly reduced. Left Atrium: Left atrial size was mildly dilated. Right Atrium: Right atrial size was mildly dilated. Pericardium: There is no evidence of pericardial effusion. Mitral Valve: The mitral valve is normal in structure. Mild to moderate mitral valve regurgitation. MV peak gradient, 5.7 mmHg. The mean mitral valve gradient is 2.0 mmHg. Tricuspid Valve: The tricuspid valve is normal in structure. Tricuspid valve regurgitation is trivial. Aortic Valve: The aortic valve is normal in structure. Aortic valve regurgitation is mild. Pulmonic  and draped in the normal sterile fashion. 1% Lidocaine was used for local anesthesia. Under ultrasound guidance a 6 Fr Safe-T-Centesis catheter was introduced. Thoracentesis was performed. The catheter was removed and a dressing applied. FINDINGS: A total of approximately 400 mL of clear yellow fluid was removed. Samples were sent to the laboratory as requested by the clinical team.  IMPRESSION: Successful ultrasound guided LEFT thoracentesis yielding 400 mL of pleural fluid. Procedure performed by: Corrin Parker, PA-C Electronically Signed   By: Roanna Banning M.D.   On: 07/26/2023 15:58   DG Chest Portable 1 View  Result Date: 07/25/2023 CLINICAL DATA:  Shortness of breath, hypoxia EXAM: PORTABLE CHEST 1 VIEW COMPARISON:  07/01/2022 FINDINGS: Moderate bilateral pleural effusions. Bibasilar atelectasis. Heart mediastinal contours within normal limits. Aortic atherosclerosis. IMPRESSION: Moderate bilateral pleural effusions with bibasilar atelectasis. Electronically Signed   By: Charlett Nose M.D.   On: 07/25/2023 19:12   VAS [place] LOWER EXTREMITY VENOUS (DVT) (7a-7p)  Result Date: 07/25/2023  Lower Venous DVT Study Patient Name:  Alicia Vang  Date of Exam:   07/25/2023 Medical Rec #: [id number]            Accession #:    [id number] Date of Birth: 1926-11-12           Patient Gender: F Patient Age:   33 years Exam Location:  [facility] Procedure:      VAS [place] LOWER EXTREMITY VENOUS (DVT) Referring Phys: Griffin Basil ROEMHILDT --------------------------------------------------------------------------------  Indications: Swelling.  Risk Factors: None identified. Limitations: Poor ultrasound/tissue interface and patient positioning. Comparison Study: No prior studies. Performing Technologist: Chanda Busing RVT  Examination Guidelines: A complete evaluation includes B-mode imaging, spectral Doppler, color Doppler, and power Doppler as needed of all accessible portions of each vessel. Bilateral testing is considered an integral part of a complete examination. Limited examinations for reoccurring indications may be performed as noted. The reflux portion of the exam is performed with the patient in reverse Trendelenburg.  +---------+---------------+---------+-----------+----------+--------------+ RIGHT    CompressibilityPhasicitySpontaneityPropertiesThrombus Aging  +---------+---------------+---------+-----------+----------+--------------+ CFV      Full           Yes      No                                  +---------+---------------+---------+-----------+----------+--------------+ SFJ      Full                                                        +---------+---------------+---------+-----------+----------+--------------+ FV Prox  Full                                                        +---------+---------------+---------+-----------+----------+--------------+ FV Mid   Full                                                        +---------+---------------+---------+-----------+----------+--------------+ FV DistalFull                                                        +---------+---------------+---------+-----------+----------+--------------+  and draped in the normal sterile fashion. 1% Lidocaine was used for local anesthesia. Under ultrasound guidance a 6 Fr Safe-T-Centesis catheter was introduced. Thoracentesis was performed. The catheter was removed and a dressing applied. FINDINGS: A total of approximately 400 mL of clear yellow fluid was removed. Samples were sent to the laboratory as requested by the clinical team.  IMPRESSION: Successful ultrasound guided LEFT thoracentesis yielding 400 mL of pleural fluid. Procedure performed by: Corrin Parker, PA-C Electronically Signed   By: Roanna Banning M.D.   On: 07/26/2023 15:58   DG Chest Portable 1 View  Result Date: 07/25/2023 CLINICAL DATA:  Shortness of breath, hypoxia EXAM: PORTABLE CHEST 1 VIEW COMPARISON:  07/01/2022 FINDINGS: Moderate bilateral pleural effusions. Bibasilar atelectasis. Heart mediastinal contours within normal limits. Aortic atherosclerosis. IMPRESSION: Moderate bilateral pleural effusions with bibasilar atelectasis. Electronically Signed   By: Charlett Nose M.D.   On: 07/25/2023 19:12   VAS [place] LOWER EXTREMITY VENOUS (DVT) (7a-7p)  Result Date: 07/25/2023  Lower Venous DVT Study Patient Name:  Alicia Vang  Date of Exam:   07/25/2023 Medical Rec #: [id number]            Accession #:    [id number] Date of Birth: 1926-11-12           Patient Gender: F Patient Age:   33 years Exam Location:  [facility] Procedure:      VAS [place] LOWER EXTREMITY VENOUS (DVT) Referring Phys: Griffin Basil ROEMHILDT --------------------------------------------------------------------------------  Indications: Swelling.  Risk Factors: None identified. Limitations: Poor ultrasound/tissue interface and patient positioning. Comparison Study: No prior studies. Performing Technologist: Chanda Busing RVT  Examination Guidelines: A complete evaluation includes B-mode imaging, spectral Doppler, color Doppler, and power Doppler as needed of all accessible portions of each vessel. Bilateral testing is considered an integral part of a complete examination. Limited examinations for reoccurring indications may be performed as noted. The reflux portion of the exam is performed with the patient in reverse Trendelenburg.  +---------+---------------+---------+-----------+----------+--------------+ RIGHT    CompressibilityPhasicitySpontaneityPropertiesThrombus Aging  +---------+---------------+---------+-----------+----------+--------------+ CFV      Full           Yes      No                                  +---------+---------------+---------+-----------+----------+--------------+ SFJ      Full                                                        +---------+---------------+---------+-----------+----------+--------------+ FV Prox  Full                                                        +---------+---------------+---------+-----------+----------+--------------+ FV Mid   Full                                                        +---------+---------------+---------+-----------+----------+--------------+ FV DistalFull                                                        +---------+---------------+---------+-----------+----------+--------------+

## 2023-07-27 NOTE — Consult Note (Signed)
WOC Nurse Consult Note: Reason for Consult: sacral wound  Wound type: 1. Deep Tissue Pressure Injury Coccyx  2. Stage 2 sacrum  Pressure Injury POA: Yes Measurement: 1.  DTPI 3 cm x 1 cm linear to coccyx, purple maroon discoloration skin intact  2.  Stage 2 PI sacrum 1 cm x 0.5 cm (per daughter this is old and healing) pink and dry  Wound bed:  as above  Drainage (amount, consistency, odor) none  Periwound: intact  Dressing procedure/placement/frequency: Cleanse sacrum/coccyx with soap and water, apply Xeroform gauze [place] [phone]) to wound beds daily.  Cover with silicone foam. May lift foam daily to replace Xeroform.  Change foam q3 days and prn soiling.   POC discussed with daughter and bedside nurse.  WOC team will not follow at this time. Re-consult if further needs arise   Thank you,    Priscella Mann MSN, RN-BC, [employer] [phone]

## 2023-07-28 DIAGNOSIS — Z7189 Other specified counseling: Secondary | ICD-10-CM | POA: Diagnosis not present

## 2023-07-28 DIAGNOSIS — I420 Dilated cardiomyopathy: Secondary | ICD-10-CM | POA: Diagnosis not present

## 2023-07-28 DIAGNOSIS — I509 Heart failure, unspecified: Secondary | ICD-10-CM | POA: Diagnosis not present

## 2023-07-28 DIAGNOSIS — E43 Unspecified severe protein-calorie malnutrition: Secondary | ICD-10-CM

## 2023-07-28 LAB — MAGNESIUM: Magnesium: 2.1 mg/dL (ref 1.7–2.4)

## 2023-07-28 LAB — BASIC METABOLIC PANEL
Anion gap: 9 (ref 5–15)
BUN: 13 mg/dL (ref 8–23)
CO2: 32 mmol/L (ref 22–32)
Calcium: 8.8 mg/dL — ABNORMAL LOW (ref 8.9–10.3)
Chloride: 100 mmol/L (ref 98–111)
Creatinine, Ser: 0.87 mg/dL (ref 0.44–1.00)
GFR, Estimated: >60 mL/min (ref >60)
Glucose, Bld: 66 mg/dL — ABNORMAL LOW (ref 70–99)
Potassium: 3.3 mmol/L — ABNORMAL LOW (ref 3.5–5.1)
Sodium: 141 mmol/L (ref 135–145)

## 2023-07-28 LAB — GLUCOSE, CAPILLARY
Glucose-Capillary: 56 mg/dL — ABNORMAL LOW (ref 70–99)
Glucose-Capillary: 66 mg/dL — ABNORMAL LOW (ref 70–99)
Glucose-Capillary: 82 mg/dL (ref 70–99)
Glucose-Capillary: 112 mg/dL — ABNORMAL HIGH (ref 70–99)
Glucose-Capillary: 116 mg/dL — ABNORMAL HIGH (ref 70–99)
Glucose-Capillary: 157 mg/dL — ABNORMAL HIGH (ref 70–99)
Glucose-Capillary: 188 mg/dL — ABNORMAL HIGH (ref 70–99)
Glucose-Capillary: 186 mg/dL — ABNORMAL HIGH (ref 70–99)

## 2023-07-28 MED ORDER — POTASSIUM CHLORIDE CRYS ER 20 MEQ PO TBCR: 40.0000 meq | EXTENDED_RELEASE_TABLET | Freq: Once | ORAL | Status: DC

## 2023-07-28 MED ORDER — POTASSIUM CHLORIDE 20 MEQ PO PACK
40.0000 meq | PACK | Freq: Once | ORAL | Status: AC
  Administered 2023-07-28: 40 meq via ORAL
  Filled 2023-07-28: qty 2

## 2023-07-28 MED ORDER — DEXTROSE 50 % IV SOLN
12.5000 g | INTRAVENOUS | Status: AC
  Administered 2023-07-28: 12.5 g via INTRAVENOUS
  Filled 2023-07-28: qty 50

## 2023-07-28 NOTE — Progress Notes (Signed)
for Healthcare Providers: [email or web address]  This test is not yet approved or cleared by the [place] and has been authorized for detection and/or diagnosis of SARS-CoV-2 by FDA under an Emergency Use Authorization (EUA). This EUA will remain in effect (meaning this test can be used) for the duration of the COVID-19 declaration under Section 564(b)(1) of the Act, 21 U.S.C. section 360bbb-3(b)(1), unless the authorization is terminated or revoked.  Performed at [facility], [address], [place], [place] [address]           Radiology Studies: ECHOCARDIOGRAM COMPLETE  Result Date: 07/26/2023    ECHOCARDIOGRAM REPORT   Patient Name:   Alicia Vang Date of Exam: 07/26/2023 Medical Rec #:  [id number]           Height:       66.0 in Accession #:    [id number]          Weight:       89.5 lb Date of Birth:  1926-12-23          BSA:          1.421 m Patient Age:    89 years            BP:           163/83 mmHg Patient Gender: F                   HR:           54 bpm. Exam Location:  Inpatient Procedure: 2D Echo, Cardiac Doppler and Color Doppler Indications:    CHF  History:        Patient has no prior history of Echocardiogram examinations.                 Arrythmias:Atrial Fibrillation, Signs/Symptoms:Edema and                 Shortness of Breath; Risk Factors:Hypertension and Diabetes.                 Parkinson's.  Sonographer:    Milda Smart Referring Phys: [phone] DEBBY CROSLEY  Sonographer Comments: Image acquisition challenging due to patient body habitus and Image acquisition challenging due to respiratory motion. IMPRESSIONS  1. Left ventricular ejection fraction, by estimation, is 25 to 30%. The left ventricle has severely decreased function. The left ventricle demonstrates global hypokinesis. There is severe concentric left ventricular hypertrophy. Elevated left atrial pressure. 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  2. Right ventricular systolic function is mildly reduced. The right ventricular size is normal. Mildly increased right ventricular wall thickness.  3. Left atrial size was mildly dilated.  4. Right atrial size was mildly dilated.  5. Large pleural effusion.  6. The mitral valve is normal in structure. Mild to moderate mitral valve regurgitation.  7. The aortic valve is normal in structure. Aortic valve regurgitation is mild. FINDINGS  Left Ventricle: Left ventricular ejection fraction, by estimation, is 25 to 30%. The left ventricle has severely decreased  function. The left ventricle demonstrates global hypokinesis. The left ventricular internal cavity size was normal in size. There is severe concentric left ventricular hypertrophy. Left ventricular diastolic function could not be evaluated due to atrial fibrillation. Elevated left atrial pressure. Right Ventricle: The right ventricular size is normal. Mildly increased right ventricular wall thickness. Right ventricular systolic function is mildly reduced. Left Atrium: Left atrial size was mildly dilated. Right Atrium: Right atrial size was mildly dilated.  for Healthcare Providers: [email or web address]  This test is not yet approved or cleared by the [place] and has been authorized for detection and/or diagnosis of SARS-CoV-2 by FDA under an Emergency Use Authorization (EUA). This EUA will remain in effect (meaning this test can be used) for the duration of the COVID-19 declaration under Section 564(b)(1) of the Act, 21 U.S.C. section 360bbb-3(b)(1), unless the authorization is terminated or revoked.  Performed at [facility], [address], [place], [place] [address]           Radiology Studies: ECHOCARDIOGRAM COMPLETE  Result Date: 07/26/2023    ECHOCARDIOGRAM REPORT   Patient Name:   Alicia Vang Date of Exam: 07/26/2023 Medical Rec #:  [id number]           Height:       66.0 in Accession #:    [id number]          Weight:       89.5 lb Date of Birth:  1926-12-23          BSA:          1.421 m Patient Age:    89 years            BP:           163/83 mmHg Patient Gender: F                   HR:           54 bpm. Exam Location:  Inpatient Procedure: 2D Echo, Cardiac Doppler and Color Doppler Indications:    CHF  History:        Patient has no prior history of Echocardiogram examinations.                 Arrythmias:Atrial Fibrillation, Signs/Symptoms:Edema and                 Shortness of Breath; Risk Factors:Hypertension and Diabetes.                 Parkinson's.  Sonographer:    Milda Smart Referring Phys: [phone] DEBBY CROSLEY  Sonographer Comments: Image acquisition challenging due to patient body habitus and Image acquisition challenging due to respiratory motion. IMPRESSIONS  1. Left ventricular ejection fraction, by estimation, is 25 to 30%. The left ventricle has severely decreased function. The left ventricle demonstrates global hypokinesis. There is severe concentric left ventricular hypertrophy. Elevated left atrial pressure. 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  2. Right ventricular systolic function is mildly reduced. The right ventricular size is normal. Mildly increased right ventricular wall thickness.  3. Left atrial size was mildly dilated.  4. Right atrial size was mildly dilated.  5. Large pleural effusion.  6. The mitral valve is normal in structure. Mild to moderate mitral valve regurgitation.  7. The aortic valve is normal in structure. Aortic valve regurgitation is mild. FINDINGS  Left Ventricle: Left ventricular ejection fraction, by estimation, is 25 to 30%. The left ventricle has severely decreased  function. The left ventricle demonstrates global hypokinesis. The left ventricular internal cavity size was normal in size. There is severe concentric left ventricular hypertrophy. Left ventricular diastolic function could not be evaluated due to atrial fibrillation. Elevated left atrial pressure. Right Ventricle: The right ventricular size is normal. Mildly increased right ventricular wall thickness. Right ventricular systolic function is mildly reduced. Left Atrium: Left atrial size was mildly dilated. Right Atrium: Right atrial size was mildly dilated.  PROGRESS NOTE    CHINYERE GALIANO  YQM:[id number] DOB: [birth year] DOA: 07/25/2023 PCP: Knox Royalty, MD   Brief Narrative:  89 year old female with past medical history of hypertension, Parkinson's disease, mostly bedbound since hip surgery approximately a year ago presented with worsening shortness of breath and lower extremity swelling.  On presentation, BNP was more than 4500.  COVID 19/influenza/RSV PCR negative.  Chest x-ray showed bilateral moderate pleural effusions.  She was started on IV Lasix.  Cardiology was consulted.  Echo showed EF of 25 to 30%.  Palliative care consulted for goals of care discussion.  Assessment & Plan:   Possible acute systolic congestive heart failure with moderate bilateral pleural effusions  Essential hypertension -Echo showed EF of 25 to 30% with global hypokinesis and findings concerning for possible infiltrative cardiomyopathy such as amyloidosis -Cardiology following.  Diuretics as per cardiology: Currently on IV Lasix daily.  Continue losartan and metoprolol.  Strict input and output.  Daily weights.  Fluid restriction.  Negative fluid balance of 6234 cc since admission.   -Status post ultrasound-guided left-sided thoracentesis and removal of 400 cc clear yellow fluid.  Hypokalemia -Replace.  Repeat a.m. labs.  Dysphagia -Diet as per SLP recommendations: Currently on dysphagia 1 diet.  Diabetes mellitus type 2 with hypoglycemia - A1c 6.9.  Blood sugars on the lower side.  Encourage oral intake.  Prolonged Qtc -QTc 526 on presentation.  QTc improving to 475.  Monitor on telemetry.Marland Kitchen  Physical deconditioning Goals of care -Patient apparently has been bedbound for almost a year after hip surgery.  PT/OT eval.  Fall precautions. -Discussed with daughter at bedside on 07/26/2023 and recommended her to consider changing the patient to DNR.  Daughter is not ready for it yet.  Palliative care following.  Cardiology also recommending conservative  management and palliative care discussions.  Severe malnutrition -Follow nutrition recommendations  Stage II pressure injury of sacrum: POA -follow wound care RN's recommendations  DVT prophylaxis: Start Lovenox Code Status: Full Family Communication: Daughter at bedside on 07/27/2023 Disposition Plan: Status is: Inpatient Remains inpatient appropriate because: Of severity of illness  Consultants: Cardiology/palliative care  Procedures: None  Antimicrobials: None   Subjective: Patient seen and examined at bedside.  Poor historian.  No fever, vomiting, agitation reported.  Oral intake remains poor.   Objective: Vitals:   07/27/23 1800 07/27/23 2007 07/27/23 2345 07/28/23 0500  BP:  134/70 136/83 139/83  Pulse:  60 73 69  Resp: (!) 22 (!) 22 13 18   Temp:  97.7 F (36.5 C) 97.9 F (36.6 C) 97.6 F (36.4 C)  TempSrc:  Oral Axillary Axillary  SpO2:  (!) 73% 96% 95%  Weight:    46.8 kg  Height:        Intake/Output Summary (Last 24 hours) at 07/28/2023 0814 Last data filed at 07/28/2023 0342 Gross per 24 hour  Intake 303 ml  Output 100 ml  Net 203 ml   Filed Weights   07/26/23 0400 07/27/23 0112 07/28/23 0500  Weight: 40.6 kg 42.1 kg 46.8 kg    Examination:  General: No acute distress.  Remains on room air.  Chronically ill and deconditioned.  Extremely thinly built ENT/neck: No obvious JVD elevation or palpable neck masses noted  respiratory: Bilateral decreased breath sounds at bases with basilar crackles  CVS: Rate mostly controlled; S1 and S2 are heard abdominal: Soft, nontender, distended mildly; no organomegaly, bowel sounds normally heard  extremities: No clubbing; mild lower extremity edema present  CNS: Very poor historian;  for Healthcare Providers: [email or web address]  This test is not yet approved or cleared by the [place] and has been authorized for detection and/or diagnosis of SARS-CoV-2 by FDA under an Emergency Use Authorization (EUA). This EUA will remain in effect (meaning this test can be used) for the duration of the COVID-19 declaration under Section 564(b)(1) of the Act, 21 U.S.C. section 360bbb-3(b)(1), unless the authorization is terminated or revoked.  Performed at [facility], [address], [place], [place] [address]           Radiology Studies: ECHOCARDIOGRAM COMPLETE  Result Date: 07/26/2023    ECHOCARDIOGRAM REPORT   Patient Name:   Alicia Vang Date of Exam: 07/26/2023 Medical Rec #:  [id number]           Height:       66.0 in Accession #:    [id number]          Weight:       89.5 lb Date of Birth:  1926-12-23          BSA:          1.421 m Patient Age:    89 years            BP:           163/83 mmHg Patient Gender: F                   HR:           54 bpm. Exam Location:  Inpatient Procedure: 2D Echo, Cardiac Doppler and Color Doppler Indications:    CHF  History:        Patient has no prior history of Echocardiogram examinations.                 Arrythmias:Atrial Fibrillation, Signs/Symptoms:Edema and                 Shortness of Breath; Risk Factors:Hypertension and Diabetes.                 Parkinson's.  Sonographer:    Milda Smart Referring Phys: [phone] DEBBY CROSLEY  Sonographer Comments: Image acquisition challenging due to patient body habitus and Image acquisition challenging due to respiratory motion. IMPRESSIONS  1. Left ventricular ejection fraction, by estimation, is 25 to 30%. The left ventricle has severely decreased function. The left ventricle demonstrates global hypokinesis. There is severe concentric left ventricular hypertrophy. Elevated left atrial pressure. 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  2. Right ventricular systolic function is mildly reduced. The right ventricular size is normal. Mildly increased right ventricular wall thickness.  3. Left atrial size was mildly dilated.  4. Right atrial size was mildly dilated.  5. Large pleural effusion.  6. The mitral valve is normal in structure. Mild to moderate mitral valve regurgitation.  7. The aortic valve is normal in structure. Aortic valve regurgitation is mild. FINDINGS  Left Ventricle: Left ventricular ejection fraction, by estimation, is 25 to 30%. The left ventricle has severely decreased  function. The left ventricle demonstrates global hypokinesis. The left ventricular internal cavity size was normal in size. There is severe concentric left ventricular hypertrophy. Left ventricular diastolic function could not be evaluated due to atrial fibrillation. Elevated left atrial pressure. Right Ventricle: The right ventricular size is normal. Mildly increased right ventricular wall thickness. Right ventricular systolic function is mildly reduced. Left Atrium: Left atrial size was mildly dilated. Right Atrium: Right atrial size was mildly dilated.  PROGRESS NOTE    CHINYERE GALIANO  YQM:[id number] DOB: [birth year] DOA: 07/25/2023 PCP: Knox Royalty, MD   Brief Narrative:  89 year old female with past medical history of hypertension, Parkinson's disease, mostly bedbound since hip surgery approximately a year ago presented with worsening shortness of breath and lower extremity swelling.  On presentation, BNP was more than 4500.  COVID 19/influenza/RSV PCR negative.  Chest x-ray showed bilateral moderate pleural effusions.  She was started on IV Lasix.  Cardiology was consulted.  Echo showed EF of 25 to 30%.  Palliative care consulted for goals of care discussion.  Assessment & Plan:   Possible acute systolic congestive heart failure with moderate bilateral pleural effusions  Essential hypertension -Echo showed EF of 25 to 30% with global hypokinesis and findings concerning for possible infiltrative cardiomyopathy such as amyloidosis -Cardiology following.  Diuretics as per cardiology: Currently on IV Lasix daily.  Continue losartan and metoprolol.  Strict input and output.  Daily weights.  Fluid restriction.  Negative fluid balance of 6234 cc since admission.   -Status post ultrasound-guided left-sided thoracentesis and removal of 400 cc clear yellow fluid.  Hypokalemia -Replace.  Repeat a.m. labs.  Dysphagia -Diet as per SLP recommendations: Currently on dysphagia 1 diet.  Diabetes mellitus type 2 with hypoglycemia - A1c 6.9.  Blood sugars on the lower side.  Encourage oral intake.  Prolonged Qtc -QTc 526 on presentation.  QTc improving to 475.  Monitor on telemetry.Marland Kitchen  Physical deconditioning Goals of care -Patient apparently has been bedbound for almost a year after hip surgery.  PT/OT eval.  Fall precautions. -Discussed with daughter at bedside on 07/26/2023 and recommended her to consider changing the patient to DNR.  Daughter is not ready for it yet.  Palliative care following.  Cardiology also recommending conservative  management and palliative care discussions.  Severe malnutrition -Follow nutrition recommendations  Stage II pressure injury of sacrum: POA -follow wound care RN's recommendations  DVT prophylaxis: Start Lovenox Code Status: Full Family Communication: Daughter at bedside on 07/27/2023 Disposition Plan: Status is: Inpatient Remains inpatient appropriate because: Of severity of illness  Consultants: Cardiology/palliative care  Procedures: None  Antimicrobials: None   Subjective: Patient seen and examined at bedside.  Poor historian.  No fever, vomiting, agitation reported.  Oral intake remains poor.   Objective: Vitals:   07/27/23 1800 07/27/23 2007 07/27/23 2345 07/28/23 0500  BP:  134/70 136/83 139/83  Pulse:  60 73 69  Resp: (!) 22 (!) 22 13 18   Temp:  97.7 F (36.5 C) 97.9 F (36.6 C) 97.6 F (36.4 C)  TempSrc:  Oral Axillary Axillary  SpO2:  (!) 73% 96% 95%  Weight:    46.8 kg  Height:        Intake/Output Summary (Last 24 hours) at 07/28/2023 0814 Last data filed at 07/28/2023 0342 Gross per 24 hour  Intake 303 ml  Output 100 ml  Net 203 ml   Filed Weights   07/26/23 0400 07/27/23 0112 07/28/23 0500  Weight: 40.6 kg 42.1 kg 46.8 kg    Examination:  General: No acute distress.  Remains on room air.  Chronically ill and deconditioned.  Extremely thinly built ENT/neck: No obvious JVD elevation or palpable neck masses noted  respiratory: Bilateral decreased breath sounds at bases with basilar crackles  CVS: Rate mostly controlled; S1 and S2 are heard abdominal: Soft, nontender, distended mildly; no organomegaly, bowel sounds normally heard  extremities: No clubbing; mild lower extremity edema present  CNS: Very poor historian;

## 2023-07-28 NOTE — Progress Notes (Signed)
Daily Progress Note   Patient Name: Alicia Vang       Date: 07/28/2023 DOB: 09/21/1927  Age: 89 y.o. MRN#: [id number] Attending Physician: Glade Lloyd, MD Primary Care Physician: Knox Royalty, MD Admit Date: 07/25/2023  Reason for Consultation/Follow-up: Establishing goals of care  Length of Stay: 3  Current Medications: Scheduled Meds:   brinzolamide  1 drop Both Eyes QHS   enoxaparin (LOVENOX) injection  40 mg Subcutaneous Q24H   feeding supplement  1 Container Oral TID BM   furosemide  40 mg Intravenous Daily   influenza vaccine adjuvanted  0.5 mL Intramuscular Tomorrow-1000   insulin aspart  0-15 Units Subcutaneous Q4H   latanoprost  1 drop Both Eyes QHS   losartan  50 mg Oral Daily   metoprolol succinate  50 mg Oral Daily   multivitamin with minerals  1 tablet Oral Daily   sodium chloride flush  3 mL Intravenous Q12H   spironolactone  12.5 mg Oral Daily   [START ON 07/29/2023] Vitamin D (Ergocalciferol)  50,000 Units Oral Weekly    Continuous Infusions:  sodium chloride      PRN Meds: sodium chloride, acetaminophen, acetaminophen, hydrALAZINE, ondansetron (ZOFRAN) IV, polyethylene glycol, sodium chloride flush  Physical Exam Vitals reviewed.  Constitutional:      General: She is awake.     Appearance: She is underweight.  Cardiovascular:     Rate and Rhythm: Normal rate.  Pulmonary:     Effort: Pulmonary effort is normal.  Neurological:     Mental Status: She is alert. She is disoriented.             Vital Signs: BP 133/75 (BP Location: Right Arm)   Pulse 74   Temp 98.7 F (37.1 C) (Oral)   Resp 14   Ht 5\' 6"  (1.676 m)   Wt 46.8 kg   SpO2 94%   BMI 16.65 kg/m  SpO2: SpO2: 94 % O2 Device: O2 Device: Room Air O2 Flow Rate: O2 Flow Rate  (L/min): 2 L/min       Palliative Assessment/Data: 40%      Patient Active Problem List   Diagnosis Date Noted   Protein-calorie malnutrition, severe 07/28/2023   Acute systolic CHF (congestive heart failure) (HCC) 07/27/2023   Dilated cardiomyopathy (HCC) 07/27/2023   Pleural effusion 07/27/2023  Acute congestive heart failure (HCC) 07/25/2023   Chronic kidney disease, stage 3a (HCC) 07/05/2022   Hypoalbuminemia 07/05/2022   Closed displaced fracture of right femoral neck (HCC) 07/01/2022   Traumatic intraventricular hemorrhage (HCC) 07/01/2022   Closed compression fracture of L3 lumbar vertebra, with delayed healing, subsequent encounter 06/01/2021   Parkinson's disease (HCC) 06/01/2021   Primary hypertension 06/01/2021   COVID-19 virus infection 10/21/2020   Bradycardia 10/21/2020   Type 2 diabetes mellitus without complication, without long-term current use of insulin (HCC) 10/21/2020   Primary osteoarthritis of left hip 10/26/2017   Osteoarthritis of left hip 08/17/2017    Palliative Care Assessment & Plan   Patient Profile: 89 y.o. female  with past medical history of hypertension, Parkinson's disease, and fall with hip surgery (2023) admitted on 07/25/2023 with worsening shortness of breath and lower extremity swelling.    On presentation, BNP was more than 4500. Chest x-ray showed bilateral moderate pleural effusions.  Assessment: Patient is awake with nursing staff at bedside. Her daughter and one son are at bedside.  I reviewed my understanding of her Parkinson's, heart failure, and dysphagia including limitations of ongoing interventions and risk for further decline despite treatment efforts. We discussed the natural disease trajectory of her conditions.  I reviewed what this would look like and emphasized the importance of considering her quality of life, overall suffering, what would be acceptable to her in the future.  We discussed code status. Recommended  consideration of DNR status, understanding evidenced-based poor outcomes in similar hospitalized patients, as the cause of the arrest is likely associated with chronic/terminal disease rather than a reversible acute cardio-pulmonary event. We also reviewed scopes of care.The children plan to discuss this more tomorrow at our meeting.  They face treatment option decisions, advanced directive decisions, and anticipatory care needs.   Encouraged family to call PMT with questions or concerns. We will meet tomorrow at 5 pm.  Recommendations/Plan: Full code Full scope Encouraged family to discuss goals of care & verify her wishes in ACP documentation PMT will follow up for family meeting 07/29/23 at 1700  Code Status:    Code Status Orders  (From admission, onward)           Start     Ordered   07/25/23 2344  Full code  (Code Status)  Continuous       Question:  By:  Answer:  Consent: discussion documented in EHR   07/25/23 2344        Extensive chart review has been completed prior to seeing the patient and speaking with her children including labs, vital signs, imagine, progress/consult notes, orders, medications, and available advance directive documents.   Time spent: 35 minutes   Thank you for allowing the Palliative Medicine Team to assist in the care of this patient.   Sherryll Burger, NP  Please contact Palliative Medicine Team phone at [phone] for questions and concerns.

## 2023-07-28 NOTE — Progress Notes (Signed)
Progress Note  Patient Name: Alicia Vang Date of Encounter: 07/28/2023  Primary Cardiologist: Armanda Magic, MD   Subjective   Seen and examined at her bedside. Awake but did not interact much with me.  Inpatient Medications    Scheduled Meds:  brinzolamide  1 drop Both Eyes QHS   enoxaparin (LOVENOX) injection  40 mg Subcutaneous Q24H   feeding supplement  1 Container Oral TID BM   furosemide  40 mg Intravenous Daily   influenza vaccine adjuvanted  0.5 mL Intramuscular Tomorrow-1000   insulin aspart  0-15 Units Subcutaneous Q4H   latanoprost  1 drop Both Eyes QHS   losartan  50 mg Oral Daily   metoprolol succinate  50 mg Oral Daily   multivitamin with minerals  1 tablet Oral Daily   sodium chloride flush  3 mL Intravenous Q12H   spironolactone  12.5 mg Oral Daily   [START ON 07/29/2023] Vitamin D (Ergocalciferol)  50,000 Units Oral Weekly   Continuous Infusions:  sodium chloride     PRN Meds: sodium chloride, acetaminophen, acetaminophen, hydrALAZINE, ondansetron (ZOFRAN) IV, polyethylene glycol, sodium chloride flush   Vital Signs    Vitals:   07/28/23 0806 07/28/23 0900 07/28/23 1000 07/28/23 1100  BP: 107/62     Pulse: 65     Resp: 17 (!) 24 19 14   Temp: 97.8 F (36.6 C)     TempSrc: Oral     SpO2: 100%     Weight:      Height:        Intake/Output Summary (Last 24 hours) at 07/28/2023 1201 Last data filed at 07/28/2023 0342 Gross per 24 hour  Intake 303 ml  Output 100 ml  Net 203 ml   Filed Weights   07/26/23 0400 07/27/23 0112 07/28/23 0500  Weight: 40.6 kg 42.1 kg 46.8 kg    Telemetry    Sinus rhythm - Personally Reviewed  ECG    None  - Personally Reviewed  Physical Exam     General: Comfortable Head: Atraumatic, normal size  Eyes: PEERLA, EOMI  Neck: Supple, normal JVD Cardiac: Normal S1, S2; RRR; no murmurs, rubs, or gallops Lungs: Clear to auscultation bilaterally Abd: Soft, nontender, no hepatomegaly  Ext: warm, no  edema Musculoskeletal: No deformities, BUE and BLE strength normal and equal Skin: Warm and dry, no rashes   Neuro: Alert and oriented to person, place, time, and situation, CNII-XII grossly intact, no focal deficits  Psych: Normal mood and affect   Labs    Chemistry Recent Labs  Lab 07/25/23 1621 07/26/23 0320 07/27/23 0409 07/28/23 0323  NA 136 138 144 141  K 3.1* 3.2* 3.6 3.3*  CL 103 102 101 100  CO2 25 24 30  32  GLUCOSE 255* 92 74 66*  BUN 19 18 16 13   CREATININE 1.06* 0.88 0.98 0.87  CALCIUM 8.3* 8.8* 8.8* 8.8*  PROT 6.1*  --   --   --   ALBUMIN 3.2*  --   --   --   AST 31  --   --   --   ALT 45*  --   --   --   ALKPHOS 76  --   --   --   BILITOT 0.9  --   --   --   GFRNONAA 49* >60 54* >60  ANIONGAP 8 12 13 9      Hematology Recent Labs  Lab 07/25/23 1621 07/26/23 0320  WBC 5.2 5.3  RBC 3.76* 4.11  HGB 11.2* 12.0  HCT 35.8* 37.2  MCV 95.2 90.5  MCH 29.8 29.2  MCHC 31.3 32.3  RDW 16.0* 15.9*  PLT 240 245    Cardiac EnzymesNo results for input(s): "TROPONINI" in the last 168 hours. No results for input(s): "TROPIPOC" in the last 168 hours.   BNP Recent Labs  Lab 07/25/23 2108  BNP >4,500.0*     DDimer No results for input(s): "DDIMER" in the last 168 hours.   Radiology    ECHOCARDIOGRAM COMPLETE  Result Date: 07/26/2023    ECHOCARDIOGRAM REPORT   Patient Name:   Alicia Vang Date of Exam: 07/26/2023 Medical Rec #:  [id number]           Height:       66.0 in Accession #:    [id number]          Weight:       89.5 lb Date of Birth:  01/20/1927          BSA:          1.421 m Patient Age:    89 years            BP:           163/83 mmHg Patient Gender: F                   HR:           54 bpm. Exam Location:  Inpatient Procedure: 2D Echo, Cardiac Doppler and Color Doppler Indications:    CHF  History:        Patient has no prior history of Echocardiogram examinations.                 Arrythmias:Atrial Fibrillation, Signs/Symptoms:Edema and                  Shortness of Breath; Risk Factors:Hypertension and Diabetes.                 Parkinson's.  Sonographer:    Milda Smart Referring Phys: [phone] DEBBY CROSLEY  Sonographer Comments: Image acquisition challenging due to patient body habitus and Image acquisition challenging due to respiratory motion. IMPRESSIONS  1. Left ventricular ejection fraction, by estimation, is 25 to 30%. The left ventricle has severely decreased function. The left ventricle demonstrates global hypokinesis. There is severe concentric left ventricular hypertrophy. Elevated left atrial pressure. 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  2. Right ventricular systolic function is mildly reduced. The right ventricular size is normal. Mildly increased right ventricular wall thickness.  3. Left atrial size was mildly dilated.  4. Right atrial size was mildly dilated.  5. Large pleural effusion.  6. The mitral valve is normal in structure. Mild to moderate mitral valve regurgitation.  7. The aortic valve is normal in structure. Aortic valve regurgitation is mild. FINDINGS  Left Ventricle: Left ventricular ejection fraction, by estimation, is 25 to 30%. The left ventricle has severely decreased function. The left ventricle demonstrates global hypokinesis. The left ventricular internal cavity size was normal in size. There is severe concentric left ventricular hypertrophy. Left ventricular diastolic function could not be evaluated due to atrial fibrillation. Elevated left atrial pressure. Right Ventricle: The right ventricular size is normal. Mildly increased right ventricular wall thickness. Right ventricular systolic function is mildly reduced. Left Atrium: Left atrial size was mildly dilated. Right Atrium: Right atrial size was mildly dilated. Pericardium: There is no evidence of pericardial effusion. Mitral Valve: The mitral valve is  normal in structure. Mild to moderate mitral valve regurgitation. MV peak  gradient, 5.7 mmHg. The mean mitral valve gradient is 2.0 mmHg. Tricuspid Valve: The tricuspid valve is normal in structure. Tricuspid valve regurgitation is trivial. Aortic Valve: The aortic valve is normal in structure. Aortic valve regurgitation is mild. Pulmonic Valve: The pulmonic valve was normal in structure. Pulmonic valve regurgitation is trivial. Aorta: The aortic root is normal in size and structure. IAS/Shunts: No atrial level shunt detected by color flow Doppler. Additional Comments: There is a large pleural effusion.  LEFT VENTRICLE PLAX 2D LVIDd:         3.85 cm   Diastology LVIDs:         3.05 cm   LV e' medial:    2.61 cm/s LV PW:         1.40 cm   LV E/e' medial:  35.3 LV IVS:        1.25 cm   LV e' lateral:   3.09 cm/s LVOT diam:     1.80 cm   LV E/e' lateral: 29.8 LVOT Area:     2.54 cm  IVC IVC diam: 1.50 cm LEFT ATRIUM             Index        RIGHT ATRIUM           Index LA diam:        3.25 cm 2.29 cm/m   RA Area:     11.40 cm LA Vol (A2C):   47.2 ml 33.22 ml/m  RA Volume:   23.10 ml  16.26 ml/m LA Vol (A4C):   48.2 ml 33.92 ml/m LA Biplane Vol: 48.6 ml 34.20 ml/m   AORTA Ao Root diam: 2.90 cm Ao Asc diam:  3.40 cm MITRAL VALVE               TRICUSPID VALVE MV Area (PHT): 1.76 cm    TR Peak grad:   9.9 mmHg MV Peak grad:  5.7 mmHg    TR Vmax:        157.00 cm/s MV Mean grad:  2.0 mmHg MV Vmax:       1.19 m/s    SHUNTS MV Vmean:      64.2 cm/s   Systemic Diam: 1.80 cm MV Decel Time: 432 msec MR Peak grad: 70.2 mmHg MR Vmax:      419.00 cm/s MV E velocity: 92.20 cm/s MV A velocity: 79.70 cm/s MV E/A ratio:  1.16 Aditya Sabharwal Electronically signed by Dorthula Nettles Signature Date/Time: 07/26/2023/4:47:54 PM    Final    DG CHEST PORT 1 VIEW  Result Date: 07/26/2023 CLINICAL DATA:  10026 Shortness of breath 10026 EXAM: PORTABLE CHEST 1 VIEW COMPARISON:  IR ultrasound, earlier same day.  Chest XR, 07/24/2024. FINDINGS: Borderline cardiac enlargement. Aortic arch atherosclerosis.  Relative pulmonary hyperinflation with flattening of the diaphragm. Improved aeration of the LEFT chest post thoracentesis without residual pleural effusion. No pneumothorax. Similar degree and appearance of blunting of the RIGHT costophrenic angle. Chronic LEFT mid clavicular deformity. No interval osseous abnormality. IMPRESSION: No pneumothorax or residual pleural effusion post LEFT thoracentesis. Electronically Signed   By: Roanna Banning M.D.   On: 07/26/2023 16:14   IR THORACENTESIS ASP PLEURAL SPACE W/IMG GUIDE  Result Date: 07/26/2023 INDICATION: Congestive heart failure with bilateral pleural effusions. Request for diagnostic and therapeutic thoracentesis. EXAM: ULTRASOUND GUIDED LEFT THORACENTESIS MEDICATIONS: 1% lidocaine 8 mL COMPLICATIONS: None immediate. PROCEDURE: An ultrasound guided thoracentesis was  thoroughly discussed with the patient and questions answered. The benefits, risks, alternatives and complications were also discussed. The patient understands and wishes to proceed with the procedure. Written consent was obtained. Ultrasound was performed to localize and mark an adequate pocket of fluid in the left chest. The area was then prepped and draped in the normal sterile fashion. 1% Lidocaine was used for local anesthesia. Under ultrasound guidance a 6 Fr Safe-T-Centesis catheter was introduced. Thoracentesis was performed. The catheter was removed and a dressing applied. FINDINGS: A total of approximately 400 mL of clear yellow fluid was removed. Samples were sent to the laboratory as requested by the clinical team. IMPRESSION: Successful ultrasound guided LEFT thoracentesis yielding 400 mL of pleural fluid. Procedure performed by: Corrin Parker, PA-C Electronically Signed   By: Roanna Banning M.D.   On: 07/26/2023 15:58    Cardiac Studies   Reviewed echo  Patient Profile     89 y.o. female hypertension, Parkinson's with newly diagnosed severe left acute dysfunction suspected to be  infiltrative cardiomyopathy  Assessment & Plan    Acute on chronic systolic heart failure Hypertension Electrolyte abnormalities/hypokalemia Diabetes mellitus Dysphagia Acute hypoxic respiratory failure which has improved suspected due to multiple etiologies: Ongoing aspiration in newly diagnosed severe left ventricular dysfunction.  Newly diagnosed left ventricular dysfunction with concern for possible amyloidosis giving echo findings.  Previous discussion for goals of care patient and family has agreed due to Friday and severe limitation for functionality it would not be in her best interest to proceed with further ischemic evaluation for this cardiomyopathy.  Palliative consult has been placed in discussions has been in progress with the palliative care team and the family.  In terms of her current treatment plan I agree with IV Lasix once daily for the next 24 hours we can transition to p.o. Lasix.  She has tolerated the addition of Aldactone, continue losartan as well as Toprol-XL. Will avoid use of SGLT2 inhibitors as the patient is at increased risk for UTI.  Continue daily weights as well as strict input and output.  Time Spent Directly with Patient:   I have spent a total of 35 with the patient reviewing notes, imaging, EKGs, labs and examining the patient as well as establishing an assessment and plan that was discussed personally with the patient.  > 50% of time was spent in direct patient care and reviewing imaging .   For questions or updates, please contact [facility] Please consult [email or web address] for contact info under Cardiology/STEMI.      Signed, Thomasene Ripple, DO  07/28/2023, 12:01 PM

## 2023-07-29 DIAGNOSIS — I5021 Acute systolic (congestive) heart failure: Secondary | ICD-10-CM

## 2023-07-29 DIAGNOSIS — Z515 Encounter for palliative care: Secondary | ICD-10-CM | POA: Diagnosis not present

## 2023-07-29 DIAGNOSIS — I509 Heart failure, unspecified: Secondary | ICD-10-CM | POA: Diagnosis not present

## 2023-07-29 DIAGNOSIS — Z7189 Other specified counseling: Secondary | ICD-10-CM | POA: Diagnosis not present

## 2023-07-29 LAB — MAGNESIUM: Magnesium: 2.1 mg/dL (ref 1.7–2.4)

## 2023-07-29 LAB — GLUCOSE, CAPILLARY
Glucose-Capillary: 111 mg/dL — ABNORMAL HIGH (ref 70–99)
Glucose-Capillary: 156 mg/dL — ABNORMAL HIGH (ref 70–99)
Glucose-Capillary: 176 mg/dL — ABNORMAL HIGH (ref 70–99)
Glucose-Capillary: 203 mg/dL — ABNORMAL HIGH (ref 70–99)
Glucose-Capillary: 98 mg/dL (ref 70–99)
Glucose-Capillary: 98 mg/dL (ref 70–99)

## 2023-07-29 LAB — BASIC METABOLIC PANEL
Anion gap: 12 (ref 5–15)
BUN: 13 mg/dL (ref 8–23)
CO2: 29 mmol/L (ref 22–32)
Calcium: 8.8 mg/dL — ABNORMAL LOW (ref 8.9–10.3)
Chloride: 98 mmol/L (ref 98–111)
Creatinine, Ser: 1.01 mg/dL — ABNORMAL HIGH (ref 0.44–1.00)
GFR, Estimated: 52 mL/min — ABNORMAL LOW (ref >60)
Glucose, Bld: 106 mg/dL — ABNORMAL HIGH (ref 70–99)
Potassium: 4.3 mmol/L (ref 3.5–5.1)
Sodium: 139 mmol/L (ref 135–145)

## 2023-07-29 MED ORDER — LOSARTAN POTASSIUM 50 MG PO TABS
100.0000 mg | ORAL_TABLET | Freq: Every day | ORAL | Status: DC
  Administered 2023-08-01 – 2023-08-03 (×3): 100 mg via ORAL
  Filled 2023-07-29 (×4): qty 2

## 2023-07-29 MED ORDER — ENOXAPARIN SODIUM 30 MG/0.3ML IJ SOSY
30.0000 mg | PREFILLED_SYRINGE | INTRAMUSCULAR | Status: DC
  Administered 2023-07-30 – 2023-08-09 (×11): 30 mg via SUBCUTANEOUS
  Filled 2023-07-29 (×11): qty 0.3

## 2023-07-29 MED ORDER — FUROSEMIDE 40 MG PO TABS
40.0000 mg | ORAL_TABLET | Freq: Every day | ORAL | Status: DC
  Administered 2023-08-01 – 2023-08-04 (×4): 40 mg via ORAL
  Filled 2023-07-29 (×6): qty 1

## 2023-07-29 MED ORDER — LOSARTAN POTASSIUM 50 MG PO TABS
50.0000 mg | ORAL_TABLET | Freq: Once | ORAL | Status: DC
  Filled 2023-07-29: qty 1

## 2023-07-29 NOTE — TOC Progression Note (Signed)
Transition of Care [facility]) - Progression Note    Patient Details  Name: Alicia Vang MRN: [id number] Date of Birth: [birth year]  [facility]) CM/SW Contact  Patrice Paradise, LCSW Phone Number: 07/29/2023, 9:22 AM  Clinical Narrative:     CSW spoke with pt's daughter and reviewed bed offers. Daughter chose [facility]. CSW reached out to [facility] to confirm bed and awaiting a response.  TOC team will continue to assist with discharge planning needs.   Expected Discharge Plan: Skilled Nursing Facility Barriers to Discharge: Continued Medical Work up  Expected Discharge Plan and Services In-house Referral: Clinical Social Work Discharge Planning Services: CM Consult Post Acute Care Choice: NA Living arrangements for the past 2 months: Single Family Home                 DME Arranged: N/A DME Agency: NA       HH Arranged: NA           Social Determinants of Health (SDOH) Interventions SDOH Screenings   Food Insecurity: No Food Insecurity (07/26/2023)  Housing: Low Risk  (07/26/2023)  Transportation Needs: No Transportation Needs (07/26/2023)  Utilities: Not At Risk (07/26/2023)  Tobacco Use: Low Risk  (07/25/2023)    Readmission Risk Interventions     No data to display

## 2023-07-29 NOTE — Progress Notes (Signed)
Daily Progress Note   Patient Name: Alicia Vang       Date: 07/29/2023 DOB: 15-Jul-1927  Age: 89 y.o. MRN#: [id number] Attending Physician: Glade Lloyd, MD Primary Care Physician: Knox Royalty, MD Admit Date: 07/25/2023  Reason for Consultation/Follow-up: Establishing goals of care  Length of Stay: 4  Current Medications: Scheduled Meds:   brinzolamide  1 drop Both Eyes QHS   enoxaparin (LOVENOX) injection  40 mg Subcutaneous Q24H   feeding supplement  1 Container Oral TID BM   furosemide  40 mg Oral Daily   influenza vaccine adjuvanted  0.5 mL Intramuscular Tomorrow-1000   insulin aspart  0-15 Units Subcutaneous Q4H   latanoprost  1 drop Both Eyes QHS   [START ON 07/30/2023] losartan  100 mg Oral Daily   losartan  50 mg Oral Once   metoprolol succinate  50 mg Oral Daily   multivitamin with minerals  1 tablet Oral Daily   sodium chloride flush  3 mL Intravenous Q12H   spironolactone  12.5 mg Oral Daily   Vitamin D (Ergocalciferol)  50,000 Units Oral Weekly    Continuous Infusions:  sodium chloride      PRN Meds: sodium chloride, acetaminophen, acetaminophen, hydrALAZINE, ondansetron (ZOFRAN) IV, polyethylene glycol, sodium chloride flush  Physical Exam Vitals reviewed.  Constitutional:      General: She is sleeping.     Appearance: She is underweight.  Cardiovascular:     Rate and Rhythm: Normal rate.  Pulmonary:     Effort: Pulmonary effort is normal.             Vital Signs: BP (!) 170/86 (BP Location: Right Arm)   Pulse (!) 104   Temp (!) 97 F (36.1 C) (Oral)   Resp 19   Ht 5\' 6"  (1.676 m)   Wt 46.8 kg   SpO2 100%   BMI 16.65 kg/m  SpO2: SpO2: 100 % O2 Device: O2 Device: Room Air O2 Flow Rate: O2 Flow Rate (L/min): 2 L/min       Palliative  Assessment/Data: 40%      Patient Active Problem List   Diagnosis Date Noted   Protein-calorie malnutrition, severe 07/28/2023   Acute systolic CHF (congestive heart failure) (HCC) 07/27/2023   Dilated cardiomyopathy (HCC) 07/27/2023   Pleural effusion 07/27/2023   Acute congestive heart  failure (HCC) 07/25/2023   Chronic kidney disease, stage 3a (HCC) 07/05/2022   Hypoalbuminemia 07/05/2022   Closed displaced fracture of right femoral neck (HCC) 07/01/2022   Traumatic intraventricular hemorrhage (HCC) 07/01/2022   Closed compression fracture of L3 lumbar vertebra, with delayed healing, subsequent encounter 06/01/2021   Parkinson's disease (HCC) 06/01/2021   Primary hypertension 06/01/2021   COVID-19 virus infection 10/21/2020   Bradycardia 10/21/2020   Type 2 diabetes mellitus without complication, without long-term current use of insulin (HCC) 10/21/2020   Primary osteoarthritis of left hip 10/26/2017   Osteoarthritis of left hip 08/17/2017    Palliative Care Assessment & Plan   Patient Profile: 89 y.o. female  with past medical history of hypertension, Parkinson's disease, and fall with hip surgery (2023) admitted on 07/25/2023 with worsening shortness of breath and lower extremity swelling.    On presentation, BNP was more than 4500. Chest x-ray showed bilateral moderate pleural effusions.  Assessment: Patient is asleep in NAD. No family at bedside.  1500: Zoom meeting with patient's four children. I reviewed my understanding of the patient's illness including limitations of ongoing interventions and high risk for further decline despite aggressive treatment efforts. I shared that the patient will likely have exacerbations and rehospitalizations.  A discussion was had today regarding advanced directives. Concepts specific to code status, artifical feeding, and rehospitalization was had.  The difference between a aggressive medical intervention path and a palliative comfort  care path for this patient at this time was had. Recommended consideration of DNR status, understanding evidenced-based poor outcomes in similar hospitalized patients, as the cause of the arrest is likely associated with chronic/terminal disease rather than a reversible acute cardio-pulmonary event. They would like to keep the patient FULL CODE.  We discussed scopes of care and what they would look like for this patient. Patient remains full scope. Offered to complete a MOST form prior to discharge. They will notify PMT if they want to do this.  Patient's family face treatment option decisions, advanced directive decisions, and anticipatory care needs. Discussed the importance of continued conversation with family and the medical providers regarding overall plan of care and treatment options, ensuring decisions are within the context of the patient's values and GOCs.   Recommendations/Plan: Full code Full scope Encouraged family to discuss goals of care & verify her wishes in ACP documentation Plan to discharge to SNF  Code Status:    Code Status Orders  (From admission, onward)           Start     Ordered   07/25/23 2344  Full code  (Code Status)  Continuous       Question:  By:  Answer:  Consent: discussion documented in EHR   07/25/23 2344        Extensive chart review has been completed prior to seeing the patient and speaking with her children including labs, vital signs, imagine, progress/consult notes, orders, medications, and available advance directive documents.   Time spent: 80 minutes   Thank you for allowing the Palliative Medicine Team to assist in the care of this patient.   Sherryll Burger, NP  Please contact Palliative Medicine Team phone at [phone] for questions and concerns.

## 2023-07-29 NOTE — TOC Progression Note (Signed)
Transition of Care [facility]) - Progression Note    Patient Details  Name: Alicia Vang MRN: [id number] Date of Birth: [birth year]  [facility]) CM/SW Contact  Patrice Paradise, LCSW Phone Number: 07/29/2023, 12:23 PM  Clinical Narrative:     CSW attempted to contact admission staff to confirm bed again and had to leave a message. CSW started [place] for tomorrow's date and [facility] as facility. Reference # [id number].  (TOC) Please confirm bed with [facility].  TOC team will continue to assist with discharge planning needs. n  Expected Discharge Plan: Skilled Nursing Facility Barriers to Discharge: Continued Medical Work up  Expected Discharge Plan and Services In-house Referral: Clinical Social Work Discharge Planning Services: CM Consult Post Acute Care Choice: NA Living arrangements for the past 2 months: Single Family Home                 DME Arranged: N/A DME Agency: NA       HH Arranged: NA           Social Determinants of Health (SDOH) Interventions SDOH Screenings   Food Insecurity: No Food Insecurity (07/26/2023)  Housing: Low Risk  (07/26/2023)  Transportation Needs: No Transportation Needs (07/26/2023)  Utilities: Not At Risk (07/26/2023)  Tobacco Use: Low Risk  (07/25/2023)    Readmission Risk Interventions     No data to display

## 2023-07-29 NOTE — Progress Notes (Signed)
PROGRESS NOTE    Alicia Vang  ZOX:[id number] DOB: 12-14-1926 DOA: 07/25/2023 PCP: Knox Royalty, MD   Brief Narrative:  89 year old female with past medical history of hypertension, Parkinson's disease, mostly bedbound since hip surgery approximately a year ago presented with worsening shortness of breath and lower extremity swelling.  On presentation, BNP was more than 4500.  COVID 19/influenza/RSV PCR negative.  Chest x-ray showed bilateral moderate pleural effusions.  She was started on IV Lasix.  Cardiology was consulted.  Echo showed EF of 25 to 30%.  Palliative care consulted for goals of care discussion.  Assessment & Plan:   Possible acute systolic congestive heart failure with moderate bilateral pleural effusions  Essential hypertension -Echo showed EF of 25 to 30% with global hypokinesis and findings concerning for possible infiltrative cardiomyopathy such as amyloidosis -Cardiology following.  Diuretics as per cardiology: Currently on IV Lasix daily.  Continue losartan and metoprolol.  Strict input and output.  Daily weights.  Fluid restriction.  Negative fluid balance of 7364 cc since admission.   -Status post ultrasound-guided left-sided thoracentesis and removal of 400 cc clear yellow fluid.  Hypokalemia -Improved  Dysphagia -Diet as per SLP recommendations: Currently on dysphagia 1 diet.  Diabetes mellitus type 2 with hypoglycemia - A1c 6.9.  Blood sugars intermittently still on the lower side.  Encourage oral intake.  Prolonged Qtc -QTc 526 on presentation.  QTc improving to 475.  Monitor on telemetry.Marland Kitchen  Physical deconditioning Goals of care -Patient apparently has been bedbound for almost a year after hip surgery.  PT/OT eval.  Fall precautions. -Overall prognosis is guarded to poor.  Palliative care following.  Patient remains full code.  Cardiology also recommending conservative management and palliative care discussions.  Severe malnutrition -Follow  nutrition recommendations  Stage II pressure injury of sacrum: POA -follow wound care RN's recommendations  DVT prophylaxis: Lovenox Code Status: Full Family Communication: Daughter at bedside on 07/27/2023 Disposition Plan: Status is: Inpatient Remains inpatient appropriate because: Of severity of illness  Consultants: Cardiology/palliative care  Procedures: None  Antimicrobials: None   Subjective: Patient seen and examined at bedside.  No chest pain, vomiting, agitation reported.  Objective: Vitals:   07/28/23 1755 07/28/23 1800 07/28/23 2023 07/29/23 0050  BP: 133/75  129/71 (!) 152/70  Pulse: 77 74  64  Resp: (!) 24 14 20 17   Temp: 98.7 F (37.1 C)  (!) 97.3 F (36.3 C) 97.8 F (36.6 C)  TempSrc: Oral  Oral Oral  SpO2: 99% 94% 96% 92%  Weight:      Height:        Intake/Output Summary (Last 24 hours) at 07/29/2023 0810 Last data filed at 07/28/2023 1800 Gross per 24 hour  Intake 170 ml  Output 1300 ml  Net -1130 ml   Filed Weights   07/26/23 0400 07/27/23 0112 07/28/23 0500  Weight: 40.6 kg 42.1 kg 46.8 kg    Examination:  General: On room air.  No distress.  Chronically ill and deconditioned.  Extremely thinly built ENT/neck: No palpable thyromegaly or JVD elevation noted  respiratory: Decreased breath sounds at bases bilaterally with some crackles  CVS: S1-S2 heard; rate currently controlled  abdominal: Soft, nontender, slightly distended; no organomegaly, normal bowel sounds heard  extremities: Trace lower extremity edema present; no cyanosis CNS: Extremely poor historian; slow to respond; wakes up slightly.  Able to move extremities.   Lymph: No obvious lymphadenopathy noted skin: No obvious ecchymosis/lesions psych: Mostly flat affect.  Not agitated currently. Musculoskeletal: No obvious  joint deformity/swelling   Data Reviewed: I have personally reviewed following labs and imaging studies  CBC: Recent Labs  Lab 07/25/23 1621 07/26/23 0320   WBC 5.2 5.3  NEUTROABS  --  3.9  HGB 11.2* 12.0  HCT 35.8* 37.2  MCV 95.2 90.5  PLT 240 245   Basic Metabolic Panel: Recent Labs  Lab 07/25/23 1621 07/26/23 0320 07/27/23 0409 07/28/23 0323 07/29/23 0319  NA 136 138 144 141 139  K 3.1* 3.2* 3.6 3.3* 4.3  CL 103 102 101 100 98  CO2 25 24 30  32 29  GLUCOSE 255* 92 74 66* 106*  BUN 19 18 16 13 13   CREATININE 1.06* 0.88 0.98 0.87 1.01*  CALCIUM 8.3* 8.8* 8.8* 8.8* 8.8*  MG 2.0 2.0 1.9 2.1 2.1   GFR: Estimated Creatinine Clearance: 25.7 mL/min (A) (by C-G formula based on SCr of 1.01 mg/dL (H)). Liver Function Tests: Recent Labs  Lab 07/25/23 1621  AST 31  ALT 45*  ALKPHOS 76  BILITOT 0.9  PROT 6.1*  ALBUMIN 3.2*   No results for input(s): "LIPASE", "AMYLASE" in the last 168 hours. No results for input(s): "AMMONIA" in the last 168 hours. Coagulation Profile: No results for input(s): "INR", "PROTIME" in the last 168 hours. Cardiac Enzymes: No results for input(s): "CKTOTAL", "CKMB", "CKMBINDEX", "TROPONINI" in the last 168 hours. BNP (last 3 results) No results for input(s): "PROBNP" in the last 8760 hours. HbA1C: No results for input(s): "HGBA1C" in the last 72 hours.  CBG: Recent Labs  Lab 07/28/23 1759 07/28/23 1946 07/29/23 0052 07/29/23 0415 07/29/23 0752  GLUCAP 188* 186* 111* 98 98   Lipid Profile: No results for input(s): "CHOL", "HDL", "LDLCALC", "TRIG", "CHOLHDL", "LDLDIRECT" in the last 72 hours. Thyroid Function Tests: No results for input(s): "TSH", "T4TOTAL", "FREET4", "T3FREE", "THYROIDAB" in the last 72 hours. Anemia Panel: No results for input(s): "VITAMINB12", "FOLATE", "FERRITIN", "TIBC", "IRON", "RETICCTPCT" in the last 72 hours. Sepsis Labs: No results for input(s): "PROCALCITON", "LATICACIDVEN" in the last 168 hours.  Recent Results (from the past 240 hour(s))  Resp panel by RT-PCR (RSV, Flu A&B, Covid) Anterior Nasal Swab     Status: None   Collection Time: 07/25/23  4:21 PM    Specimen: Anterior Nasal Swab  Result Value Ref Range Status   SARS Coronavirus 2 by RT PCR NEGATIVE NEGATIVE Final   Influenza A by PCR NEGATIVE NEGATIVE Final   Influenza B by PCR NEGATIVE NEGATIVE Final    Comment: (NOTE) The Xpert Xpress SARS-CoV-2/FLU/RSV plus assay is intended as an aid in the diagnosis of influenza from Nasopharyngeal swab specimens and should not be used as a sole basis for treatment. Nasal washings and aspirates are unacceptable for Xpert Xpress SARS-CoV-2/FLU/RSV testing.  Fact Sheet for Patients: BloggerCourse.com  Fact Sheet for Healthcare Providers: SeriousBroker.it  This test is not yet approved or cleared by the [place] FDA and has been authorized for detection and/or diagnosis of SARS-CoV-2 by FDA under an Emergency Use Authorization (EUA). This EUA will remain in effect (meaning this test can be used) for the duration of the COVID-19 declaration under Section 564(b)(1) of the Act, 21 U.S.C. section 360bbb-3(b)(1), unless the authorization is terminated or revoked.     Resp Syncytial Virus by PCR NEGATIVE NEGATIVE Final    Comment: (NOTE) Fact Sheet for Patients: BloggerCourse.com  Fact Sheet for Healthcare Providers: SeriousBroker.it  This test is not yet approved or cleared by the [place] FDA and has been authorized for detection and/or diagnosis  of SARS-CoV-2 by FDA under an Emergency Use Authorization (EUA). This EUA will remain in effect (meaning this test can be used) for the duration of the COVID-19 declaration under Section 564(b)(1) of the Act, 21 U.S.C. section 360bbb-3(b)(1), unless the authorization is terminated or revoked.  Performed at [facility], [address], [place], [place] [address]          Radiology Studies: No results found.      Scheduled Meds:  brinzolamide  1 drop Both Eyes QHS    enoxaparin (LOVENOX) injection  40 mg Subcutaneous Q24H   feeding supplement  1 Container Oral TID BM   furosemide  40 mg Intravenous Daily   influenza vaccine adjuvanted  0.5 mL Intramuscular Tomorrow-1000   insulin aspart  0-15 Units Subcutaneous Q4H   latanoprost  1 drop Both Eyes QHS   losartan  50 mg Oral Daily   metoprolol succinate  50 mg Oral Daily   multivitamin with minerals  1 tablet Oral Daily   sodium chloride flush  3 mL Intravenous Q12H   spironolactone  12.5 mg Oral Daily   Vitamin D (Ergocalciferol)  50,000 Units Oral Weekly   Continuous Infusions:  sodium chloride            Glade Lloyd, MD [facility] 07/29/2023, 8:10 AM

## 2023-07-29 NOTE — Progress Notes (Signed)
Informed by telemetry that patient's heart rate drop down in the 30s- unsustained. Primary nurse at bedside- VS obtained.

## 2023-07-29 NOTE — Progress Notes (Signed)
Progress Note  Patient Name: Alicia Vang Date of Encounter: 07/29/2023  Primary Cardiologist: Armanda Magic, MD   Subjective   Seen and examined at her bedside. Awake but did not interact much with me.  Inpatient Medications    Scheduled Meds:  brinzolamide  1 drop Both Eyes QHS   enoxaparin (LOVENOX) injection  40 mg Subcutaneous Q24H   feeding supplement  1 Container Oral TID BM   furosemide  40 mg Intravenous Daily   influenza vaccine adjuvanted  0.5 mL Intramuscular Tomorrow-1000   insulin aspart  0-15 Units Subcutaneous Q4H   latanoprost  1 drop Both Eyes QHS   losartan  50 mg Oral Daily   metoprolol succinate  50 mg Oral Daily   multivitamin with minerals  1 tablet Oral Daily   sodium chloride flush  3 mL Intravenous Q12H   spironolactone  12.5 mg Oral Daily   Vitamin D (Ergocalciferol)  50,000 Units Oral Weekly   Continuous Infusions:  sodium chloride     PRN Meds: sodium chloride, acetaminophen, acetaminophen, hydrALAZINE, ondansetron (ZOFRAN) IV, polyethylene glycol, sodium chloride flush   Vital Signs    Vitals:   07/28/23 1800 07/28/23 2023 07/29/23 0050 07/29/23 0755  BP:  129/71 (!) 152/70 (!) 170/86  Pulse: 74  64 (!) 104  Resp: 14 20 17 19   Temp:  (!) 97.3 F (36.3 C) 97.8 F (36.6 C) (!) 97 F (36.1 C)  TempSrc:  Oral Oral Oral  SpO2: 94% 96% 92% 100%  Weight:      Height:        Intake/Output Summary (Last 24 hours) at 07/29/2023 0923 Last data filed at 07/29/2023 1610 Gross per 24 hour  Intake 220 ml  Output 1500 ml  Net -1280 ml   Filed Weights   07/26/23 0400 07/27/23 0112 07/28/23 0500  Weight: 40.6 kg 42.1 kg 46.8 kg    Telemetry    Sinus rhythm - Personally Reviewed  ECG    None  - Personally Reviewed  Physical Exam     General: Comfortable Head: Atraumatic, normal size  Eyes: PEERLA, EOMI  Neck: Supple, normal JVD Cardiac: Normal S1, S2; RRR; no murmurs, rubs, or gallops Lungs: Clear to auscultation  bilaterally Abd: Soft, nontender, no hepatomegaly  Ext: warm, no edema Musculoskeletal: No deformities, BUE and BLE strength normal and equal Skin: Warm and dry, no rashes   Neuro: Alert and oriented to person, place, time, and situation, CNII-XII grossly intact, no focal deficits  Psych: Normal mood and affect   Labs    Chemistry Recent Labs  Lab 07/25/23 1621 07/26/23 0320 07/27/23 0409 07/28/23 0323 07/29/23 0319  NA 136   < > 144 141 139  K 3.1*   < > 3.6 3.3* 4.3  CL 103   < > 101 100 98  CO2 25   < > 30 32 29  GLUCOSE 255*   < > 74 66* 106*  BUN 19   < > 16 13 13   CREATININE 1.06*   < > 0.98 0.87 1.01*  CALCIUM 8.3*   < > 8.8* 8.8* 8.8*  PROT 6.1*  --   --   --   --   ALBUMIN 3.2*  --   --   --   --   AST 31  --   --   --   --   ALT 45*  --   --   --   --   ALKPHOS 76  --   --   --   --  BILITOT 0.9  --   --   --   --   GFRNONAA 49*   < > 54* >60 52*  ANIONGAP 8   < > 13 9 12    < > = values in this interval not displayed.     Hematology Recent Labs  Lab 07/25/23 1621 07/26/23 0320  WBC 5.2 5.3  RBC 3.76* 4.11  HGB 11.2* 12.0  HCT 35.8* 37.2  MCV 95.2 90.5  MCH 29.8 29.2  MCHC 31.3 32.3  RDW 16.0* 15.9*  PLT 240 245    Cardiac EnzymesNo results for input(s): "TROPONINI" in the last 168 hours. No results for input(s): "TROPIPOC" in the last 168 hours.   BNP Recent Labs  Lab 07/25/23 2108  BNP >4,500.0*     DDimer No results for input(s): "DDIMER" in the last 168 hours.   Radiology    No results found.  Cardiac Studies   Reviewed echo  Patient Profile     89 y.o. female hypertension, Parkinson's with newly diagnosed severe left acute dysfunction suspected to be infiltrative cardiomyopathy  Assessment & Plan    Acute on chronic systolic heart failure Hypertension Electrolyte abnormalities/hypokalemia Diabetes mellitus Dysphagia Acute hypoxic respiratory failure which has improved suspected due to multiple etiologies: Ongoing  aspiration in newly diagnosed severe left ventricular dysfunction.  Newly diagnosed left ventricular dysfunction with concern for possible amyloidosis giving echo findings.  Previous discussion for goals of care patient and family has agreed due to Friday and severe limitation for functionality it would not be in her best interest to proceed with further ischemic evaluation for this cardiomyopathy.  Palliative consult has been placed in discussions has been in progress with the palliative care team and the family.  In terms of her current treatment plan we will transition the patient to p.o. Lasix today 40 mg daily, I going to increase her losartan to 100 mg daily pressure is elevated, continue Aldactone, plan to transition patient to report XL prior to start.   Will avoid use of SGLT2 inhibitors as the patient is at increased risk for UTI.  Continue daily weights as well as strict input and output.    Time Spent Directly with Patient:   I have spent a total of 35 with the patient reviewing notes, imaging, EKGs, labs and examining the patient as well as establishing an assessment and plan that was discussed personally with the patient.  > 50% of time was spent in direct patient care and reviewing imaging .   For questions or updates, please contact [facility] Please consult [email or web address] for contact info under Cardiology/STEMI.      Signed, Thomasene Ripple, DO  07/29/2023, 9:23 AM

## 2023-07-29 NOTE — Progress Notes (Addendum)
Blinds open and lights turned on in room. Assistance provided to get patient in upright position to eat breakfast and take medication. Patient took 5 bites of  grits and drank half of orange juice before she started coughing. Rest period provided. Communicated with nurse aide regarding feeding.

## 2023-07-29 NOTE — Progress Notes (Signed)
Patient's family at bedside. Attempted to give patient scheduled medications crushed in applesauce. Patient pocketing- oral suction and mouth care performed.

## 2023-07-30 DIAGNOSIS — I5021 Acute systolic (congestive) heart failure: Secondary | ICD-10-CM | POA: Diagnosis not present

## 2023-07-30 DIAGNOSIS — I509 Heart failure, unspecified: Secondary | ICD-10-CM | POA: Diagnosis not present

## 2023-07-30 DIAGNOSIS — L8990 Pressure ulcer of unspecified site, unspecified stage: Secondary | ICD-10-CM

## 2023-07-30 LAB — BASIC METABOLIC PANEL
Anion gap: 13 (ref 5–15)
BUN: 15 mg/dL (ref 8–23)
CO2: 28 mmol/L (ref 22–32)
Calcium: 8.6 mg/dL — ABNORMAL LOW (ref 8.9–10.3)
Chloride: 96 mmol/L — ABNORMAL LOW (ref 98–111)
Creatinine, Ser: 0.99 mg/dL (ref 0.44–1.00)
GFR, Estimated: 53 mL/min — ABNORMAL LOW (ref >60)
Glucose, Bld: 112 mg/dL — ABNORMAL HIGH (ref 70–99)
Potassium: 3.8 mmol/L (ref 3.5–5.1)
Sodium: 137 mmol/L (ref 135–145)

## 2023-07-30 LAB — GLUCOSE, CAPILLARY
Glucose-Capillary: 117 mg/dL — ABNORMAL HIGH (ref 70–99)
Glucose-Capillary: 125 mg/dL — ABNORMAL HIGH (ref 70–99)
Glucose-Capillary: 146 mg/dL — ABNORMAL HIGH (ref 70–99)
Glucose-Capillary: 164 mg/dL — ABNORMAL HIGH (ref 70–99)
Glucose-Capillary: 156 mg/dL — ABNORMAL HIGH (ref 70–99)

## 2023-07-30 LAB — MAGNESIUM: Magnesium: 2.2 mg/dL (ref 1.7–2.4)

## 2023-07-30 MED ORDER — INSULIN ASPART 100 UNIT/ML IJ SOLN
0.0000 [IU] | Freq: Three times a day (TID) | INTRAMUSCULAR | Status: DC
  Administered 2023-07-30 – 2023-07-31 (×3): 3 [IU] via SUBCUTANEOUS
  Administered 2023-07-31 – 2023-08-01 (×2): 5 [IU] via SUBCUTANEOUS
  Administered 2023-08-02 (×2): 3 [IU] via SUBCUTANEOUS
  Administered 2023-08-02: 2 [IU] via SUBCUTANEOUS
  Administered 2023-08-03: 5 [IU] via SUBCUTANEOUS
  Administered 2023-08-03 (×2): 3 [IU] via SUBCUTANEOUS
  Administered 2023-08-03: 2 [IU] via SUBCUTANEOUS
  Administered 2023-08-04: 5 [IU] via SUBCUTANEOUS
  Administered 2023-08-04 – 2023-08-05 (×3): 2 [IU] via SUBCUTANEOUS
  Administered 2023-08-05: 5 [IU] via SUBCUTANEOUS
  Administered 2023-08-05 – 2023-08-06 (×3): 2 [IU] via SUBCUTANEOUS
  Administered 2023-08-06 – 2023-08-08 (×5): 3 [IU] via SUBCUTANEOUS
  Administered 2023-08-08: 5 [IU] via SUBCUTANEOUS
  Administered 2023-08-09: 3 [IU] via SUBCUTANEOUS

## 2023-07-30 NOTE — Progress Notes (Signed)
PROGRESS NOTE    ADAMAE GRUIS  ZOX:[id number] DOB: March 25, 1927 DOA: 07/25/2023 PCP: Knox Royalty, MD   Brief Narrative:  89 year old female with past medical history of hypertension, Parkinson's disease, mostly bedbound since hip surgery approximately a year ago presented with worsening shortness of breath and lower extremity swelling.  On presentation, BNP was more than 4500.  COVID 19/influenza/RSV PCR negative.  Chest x-ray showed bilateral moderate pleural effusions.  She was started on IV Lasix.  Cardiology was consulted.  Echo showed EF of 25 to 30%.  Palliative care consulted for goals of care discussion.  Assessment & Plan:   Possible acute systolic congestive heart failure with moderate bilateral pleural effusions  Essential hypertension -Echo showed EF of 25 to 30% with global hypokinesis and findings concerning for possible infiltrative cardiomyopathy such as amyloidosis -Cardiology following.  Diuretics as per cardiology: Has been switched to oral Lasix.  Continue losartan and metoprolol.  Strict input and output.  Daily weights.  Fluid restriction.  Negative fluid balance of 8814 cc since admission.   -Status post ultrasound-guided left-sided thoracentesis and removal of 400 cc clear yellow fluid.  Hypokalemia -Improved  Dysphagia -Diet as per SLP recommendations: Currently on dysphagia 1 diet.  Diabetes mellitus type 2 with hypoglycemia - A1c 6.9.  Blood sugars improving.  Encourage oral intake.  Prolonged Qtc -QTc 526 on presentation.  QTc improving to 475.  Monitor on telemetry.Marland Kitchen  Physical deconditioning Goals of care -Patient apparently has been bedbound for almost a year after hip surgery.  PT/OT eval.  Fall precautions. -Overall prognosis is guarded to poor.  Palliative care following.  Patient remains full code.  Cardiology also recommending conservative management and palliative care discussions.  Severe malnutrition -Follow nutrition  recommendations  Stage II pressure injury of sacrum: POA -follow wound care RN's recommendations  DVT prophylaxis: Lovenox Code Status: Full Family Communication: Daughter at bedside on 07/27/2023 Disposition Plan: Status is: Inpatient Remains inpatient appropriate because: Of severity of illness  Consultants: Cardiology/palliative care  Procedures: None  Antimicrobials: None   Subjective: Patient seen and examined at bedside.  No fever, agitation, vomiting or seizures reported.   Objective: Vitals:   07/30/23 0345 07/30/23 0350 07/30/23 0355 07/30/23 0816  BP:    (!) 127/101  Pulse: (!) 124 (!) 45 (!) 205 64  Resp: 19 19 19 20   Temp:      TempSrc:      SpO2: 94% 95% (!) 83% 94%  Weight:      Height:        Intake/Output Summary (Last 24 hours) at 07/30/2023 0951 Last data filed at 07/29/2023 2010 Gross per 24 hour  Intake --  Output 1300 ml  Net -1300 ml   Filed Weights   07/27/23 0112 07/28/23 0500 07/30/23 0325  Weight: 42.1 kg 46.8 kg 46.6 kg    Examination:  General: No acute distress.  Remains on room air.  Chronically ill and deconditioned.  Extremely thinly built ENT/neck: No obvious JVD elevation or palpable neck masses noted  respiratory: Bilateral decreased breath sounds at bases with scattered crackles CVS: Rate mostly controlled; S1 and S2 are heard  abdominal: Soft, nontender, distended slightly; no organomegaly, bowel heard extremities: No clubbing; mild lower extremity edema present  CNS: Wakes up only very slightly; extremely slow to respond.  Poor historian..  Able to move extremities.   Lymph: No palpable lymphadenopathy noted  skin: No obvious petechiae/rashes  psych: Not agitated.  Flat affect. Musculoskeletal: No obvious joint tenderness/erythema  Data Reviewed: I have personally reviewed following labs and imaging studies  CBC: Recent Labs  Lab 07/25/23 1621 07/26/23 0320  WBC 5.2 5.3  NEUTROABS  --  3.9  HGB 11.2* 12.0  HCT  35.8* 37.2  MCV 95.2 90.5  PLT 240 245   Basic Metabolic Panel: Recent Labs  Lab 07/26/23 0320 07/27/23 0409 07/28/23 0323 07/29/23 0319 07/30/23 0359  NA 138 144 141 139 137  K 3.2* 3.6 3.3* 4.3 3.8  CL 102 101 100 98 96*  CO2 24 30 32 29 28  GLUCOSE 92 74 66* 106* 112*  BUN 18 16 13 13 15   CREATININE 0.88 0.98 0.87 1.01* 0.99  CALCIUM 8.8* 8.8* 8.8* 8.8* 8.6*  MG 2.0 1.9 2.1 2.1 2.2   GFR: Estimated Creatinine Clearance: 26.1 mL/min (by C-G formula based on SCr of 0.99 mg/dL). Liver Function Tests: Recent Labs  Lab 07/25/23 1621  AST 31  ALT 45*  ALKPHOS 76  BILITOT 0.9  PROT 6.1*  ALBUMIN 3.2*   No results for input(s): "LIPASE", "AMYLASE" in the last 168 hours. No results for input(s): "AMMONIA" in the last 168 hours. Coagulation Profile: No results for input(s): "INR", "PROTIME" in the last 168 hours. Cardiac Enzymes: No results for input(s): "CKTOTAL", "CKMB", "CKMBINDEX", "TROPONINI" in the last 168 hours. BNP (last 3 results) No results for input(s): "PROBNP" in the last 8760 hours. HbA1C: No results for input(s): "HGBA1C" in the last 72 hours.  CBG: Recent Labs  Lab 07/29/23 1056 07/29/23 1530 07/29/23 2015 07/30/23 0327 07/30/23 0746  GLUCAP 176* 156* 203* 117* 125*   Lipid Profile: No results for input(s): "CHOL", "HDL", "LDLCALC", "TRIG", "CHOLHDL", "LDLDIRECT" in the last 72 hours. Thyroid Function Tests: No results for input(s): "TSH", "T4TOTAL", "FREET4", "T3FREE", "THYROIDAB" in the last 72 hours. Anemia Panel: No results for input(s): "VITAMINB12", "FOLATE", "FERRITIN", "TIBC", "IRON", "RETICCTPCT" in the last 72 hours. Sepsis Labs: No results for input(s): "PROCALCITON", "LATICACIDVEN" in the last 168 hours.  Recent Results (from the past 240 hour(s))  Resp panel by RT-PCR (RSV, Flu A&B, Covid) Anterior Nasal Swab     Status: None   Collection Time: 07/25/23  4:21 PM   Specimen: Anterior Nasal Swab  Result Value Ref Range Status    SARS Coronavirus 2 by RT PCR NEGATIVE NEGATIVE Final   Influenza A by PCR NEGATIVE NEGATIVE Final   Influenza B by PCR NEGATIVE NEGATIVE Final    Comment: (NOTE) The Xpert Xpress SARS-CoV-2/FLU/RSV plus assay is intended as an aid in the diagnosis of influenza from Nasopharyngeal swab specimens and should not be used as a sole basis for treatment. Nasal washings and aspirates are unacceptable for Xpert Xpress SARS-CoV-2/FLU/RSV testing.  Fact Sheet for Patients: BloggerCourse.com  Fact Sheet for Healthcare Providers: SeriousBroker.it  This test is not yet approved or cleared by the [place] FDA and has been authorized for detection and/or diagnosis of SARS-CoV-2 by FDA under an Emergency Use Authorization (EUA). This EUA will remain in effect (meaning this test can be used) for the duration of the COVID-19 declaration under Section 564(b)(1) of the Act, 21 U.S.C. section 360bbb-3(b)(1), unless the authorization is terminated or revoked.     Resp Syncytial Virus by PCR NEGATIVE NEGATIVE Final    Comment: (NOTE) Fact Sheet for Patients: BloggerCourse.com  Fact Sheet for Healthcare Providers: SeriousBroker.it  This test is not yet approved or cleared by the [place] FDA and has been authorized for detection and/or diagnosis of SARS-CoV-2 by FDA under an  Emergency Use Authorization (EUA). This EUA will remain in effect (meaning this test can be used) for the duration of the COVID-19 declaration under Section 564(b)(1) of the Act, 21 U.S.C. section 360bbb-3(b)(1), unless the authorization is terminated or revoked.  Performed at [facility], [address], [place], [place] [address]          Radiology Studies: No results found.      Scheduled Meds:  brinzolamide  1 drop Both Eyes QHS   enoxaparin (LOVENOX) injection  30 mg Subcutaneous Q24H    feeding supplement  1 Container Oral TID BM   furosemide  40 mg Oral Daily   influenza vaccine adjuvanted  0.5 mL Intramuscular Tomorrow-1000   insulin aspart  0-15 Units Subcutaneous Q4H   latanoprost  1 drop Both Eyes QHS   losartan  100 mg Oral Daily   losartan  50 mg Oral Once   metoprolol succinate  50 mg Oral Daily   multivitamin with minerals  1 tablet Oral Daily   sodium chloride flush  3 mL Intravenous Q12H   spironolactone  12.5 mg Oral Daily   Vitamin D (Ergocalciferol)  50,000 Units Oral Weekly   Continuous Infusions:  sodium chloride            Glade Lloyd, MD [facility] 07/30/2023, 9:51 AM

## 2023-07-30 NOTE — TOC Progression Note (Addendum)
Transition of Care [facility]) - Progression Note    Patient Details  Name: Alicia Vang MRN: [id number] Date of Birth: 03-08-1927  Transition of Care [facility]) CM/SW Contact  Delilah Shan, LCSWA Phone Number: 07/30/2023, 1:30 PM  Clinical Narrative:     CSW Lvm with Alicia Vang. CSW awaiting call back to confirm SNF bed for patient. Patients insurance authorization currently pending. CSW will continue to follow and assist with patients dc planning needs.  Update- CSW received call back from [facility] who confirmed SNF bed for patient. Insurance authorization pending.  Update-4:04pm- CSW checked on patients insurance authorization. Patients insurance authorization was denied. CSW called patients insurance to confirm. Patients insurance confirmed they called Alicia Vang previous CSW Lvm patients insurance went to peer to peer. Insurance informed CSW insurance denied due to not a change in function. CSW informed patients daughter. Patients daughter wants to appeal. CSW provided appeal # [phone]. TOC will continue to follow.   Expected Discharge Plan: Skilled Nursing Facility Barriers to Discharge: Continued Medical Work up  Expected Discharge Plan and Services In-house Referral: Clinical Social Work Discharge Planning Services: CM Consult Post Acute Care Choice: NA Living arrangements for the past 2 months: Single Family Home                 DME Arranged: N/A DME Agency: NA       HH Arranged: NA           Social Determinants of Health (SDOH) Interventions SDOH Screenings   Food Insecurity: No Food Insecurity (07/26/2023)  Housing: Low Risk  (07/26/2023)  Transportation Needs: No Transportation Needs (07/26/2023)  Utilities: Not At Risk (07/26/2023)  Tobacco Use: Low Risk  (07/25/2023)    Readmission Risk Interventions     No data to display

## 2023-07-30 NOTE — Progress Notes (Signed)
   Patient Name: Alicia Vang Date of Encounter: 07/30/2023 [facility] Cardiologist: Armanda Magic, MD   Interval Summary  .    Patient very somnolent on exam this morning. Unable to ask about symptoms.   Vital Signs .    Vitals:   07/30/23 0340 07/30/23 0345 07/30/23 0350 07/30/23 0355  BP:      Pulse: (!) 52 (!) 124 (!) 45 (!) 205  Resp: 18 19 19 19   Temp:      TempSrc:      SpO2: 97% 94% 95% (!) 83%  Weight:      Height:        Intake/Output Summary (Last 24 hours) at 07/30/2023 0849 Last data filed at 07/29/2023 2010 Gross per 24 hour  Intake --  Output 1300 ml  Net -1300 ml      07/30/2023    3:25 AM 07/28/2023    5:00 AM 07/27/2023    1:12 AM  Last 3 Weights  Weight (lbs) 102 lb 11.8 oz 103 lb 2.8 oz 92 lb 13 oz  Weight (kg) 46.6 kg 46.8 kg 42.1 kg      Telemetry/ECG    Sinus rhythm/sinus bradycardia with frequent PACs - Personally Reviewed  Physical Exam .   GEN: No acute distress. Chronically ill appearing Neck: No JVD Cardiac: RRR, no murmurs, rubs, or gallops.  Respiratory: Clear to auscultation bilaterally. GI: Soft, nontender, non-distended  MS: No edema  Assessment & Plan .     Acute hypoxic respiratory failure Aspiration Systolic heart failure  Patient admitted with acute hypoxic respiratory failure of suspected multi-faceted etiology including aspiration and new severe LV dysfunction. TTE showed LVEF 25-30% with global hypokinesis and severe LVH. LV myocardium consistent with infiltrative cardiomyopathy/amyloid per read.   Given frailty and severely limited functional status at baseline, further ischemic evaluation not recommended for this patient.  Agree with ongoing palliative discussions Appears euvolemic on physical exam today. Continue Lasix 40mg  (will need to follow renal function closely as with limited oral intake, patient at high risk of dehydration).  Continue Losartan 100mg  and Metoprolol Succinate 50mg . May need to  decrease dosing with bradycardia. Continue Spironolactone 12.5mg  daily Not an SGLT2 candidate due to limited functional status/high risk for UTI.  Hypertension   BP improved today with increase in Losartan dosing. Isolated low BP overnight but appears stable this morning. Monitor closely with meds as above.  Per primary team: Dysphagia DM type II Parkinson and physical deconditioning For questions or updates, please contact [facility] Please consult [email or web address] for contact info under        Signed, Perlie Gold, PA-C

## 2023-07-30 NOTE — Care Management Important Message (Signed)
Important Message  Patient Details  Name: Alicia Vang MRN: [id number] Date of Birth: 1927-06-29   Important Message Given:  Yes - Medicare IM     Dorena Bodo 07/30/2023, 2:14 PM

## 2023-07-31 DIAGNOSIS — I509 Heart failure, unspecified: Secondary | ICD-10-CM | POA: Diagnosis not present

## 2023-07-31 LAB — MAGNESIUM: Magnesium: 2.2 mg/dL (ref 1.7–2.4)

## 2023-07-31 LAB — BASIC METABOLIC PANEL
Anion gap: 11 (ref 5–15)
BUN: 17 mg/dL (ref 8–23)
CO2: 29 mmol/L (ref 22–32)
Calcium: 8.4 mg/dL — ABNORMAL LOW (ref 8.9–10.3)
Chloride: 96 mmol/L — ABNORMAL LOW (ref 98–111)
Creatinine, Ser: 0.93 mg/dL (ref 0.44–1.00)
GFR, Estimated: 57 mL/min — ABNORMAL LOW (ref >60)
Glucose, Bld: 88 mg/dL (ref 70–99)
Potassium: 3.5 mmol/L (ref 3.5–5.1)
Sodium: 136 mmol/L (ref 135–145)

## 2023-07-31 LAB — GLUCOSE, CAPILLARY
Glucose-Capillary: 85 mg/dL (ref 70–99)
Glucose-Capillary: 86 mg/dL (ref 70–99)
Glucose-Capillary: 174 mg/dL — ABNORMAL HIGH (ref 70–99)
Glucose-Capillary: 208 mg/dL — ABNORMAL HIGH (ref 70–99)

## 2023-07-31 MED ORDER — FUROSEMIDE 40 MG PO TABS: 40.0000 mg | ORAL_TABLET | Freq: Every day | ORAL | 0 refills | Status: DC

## 2023-07-31 MED ORDER — INFLUENZA VAC A&B SURF ANT ADJ 0.5 ML IM SUSY
0.5000 mL | PREFILLED_SYRINGE | INTRAMUSCULAR | Status: AC
  Administered 2023-08-03: 0.5 mL via INTRAMUSCULAR
  Filled 2023-07-31: qty 0.5

## 2023-07-31 MED ORDER — SPIRONOLACTONE 25 MG PO TABS: 12.5000 mg | ORAL_TABLET | Freq: Every day | ORAL | 0 refills | Status: DC

## 2023-07-31 NOTE — Progress Notes (Signed)
Speech Language Pathology Treatment: Dysphagia  Patient Details Name: Alicia Vang MRN: [id number] DOB: 12-20-1926 Today's Date: 07/31/2023 Time: 0865-7846 SLP Time Calculation (min) (ACUTE ONLY): 12 min  Assessment / Plan / Recommendation Clinical Impression  Pt seen for dysphagia with daughter at bedside. Daughter stated she would like to continue with puree diet at home until her mom gains more strength. Offered some instruction on how to puree food and also advised to use internet if needed and daughter voiced understanding. Pt needed additional time to use straw intermittently due to cognitive deficits but able to after she received applesauce. Pt holding applesauce in oral cavity versus pharyngeal swallow delay. Demonstrated for daughter how to palpate pt's throat to feel for swallow. Also educated to give pt additional time for swallow initiation and/or use dry spoon to assist. There was no coughing or throat clearing this session. Pt is scheduled to be discharged today. No further ST needed.    HPI HPI: This is a 89 year old female who lives at home with her daughter.  Patient has been bedbound since hip surgery approximately a year ago.  Her past medical history significant for HTN, Parkinson's disease.  Pt presented to the ED with SOB and RLE swelling. CXR remarkable for moderate bilateral pleural effusions with bibasilar atelectasis.      SLP Plan  All goals met;Discharge SLP treatment due to (comment) (pt discharging home)      Recommendations for follow up therapy are one component of a multi-disciplinary discharge planning process, led by the attending physician.  Recommendations may be updated based on patient status, additional functional criteria and insurance authorization.    Recommendations  Diet recommendations: Dysphagia 1 (puree);Thin liquid Liquids provided via: Straw;Cup Medication Administration: Crushed with puree Supervision: Staff to assist with self  feeding;Full supervision/cueing for compensatory strategies Compensations: Minimize environmental distractions;Small sips/bites;Slow rate Postural Changes and/or Swallow Maneuvers: Seated upright 90 degrees                  Oral care BID   Frequent or constant Supervision/Assistance Dysphagia, oropharyngeal phase (R13.12)     All goals met;Discharge SLP treatment due to (comment) (pt discharging home)     Alicia Vang  07/31/2023, 11:34 AM

## 2023-07-31 NOTE — TOC Transition Note (Signed)
Transition of Care [facility]) - CM/SW Discharge Note   Patient Details  Name: Alicia Vang MRN: [id number] Date of Birth: 1927/06/04  Transition of Care [facility]) CM/SW Contact:  Deatra Robinson, [place] Phone Number: 07/31/2023, 11:43 AM   Clinical Narrative:  per MD, pt is medically stable for dc. Spoke to pt's dtr who reports she attempted to complete appeal with [facility] for SNF but did not have all the requested information. Dtr requesting SW assist with appeal which was done, case #[id number]. Will provide updates as available.   [employer] appeals phone: [phone] option 3 [employer] appeals fax: [phone]  Dellie Burns, MSW, LCSW [phone] (coverage)          Barriers to Discharge: Continued Medical Work up   Patient Goals and CMS Choice CMS Medicare.gov Compare Post Acute Care list provided to:: Patient Represenative (must comment) Choice offered to / list presented to : Adult Children  Discharge Placement                         Discharge Plan and Services Additional resources added to the After Visit Summary for   In-house Referral: Clinical Social Work Discharge Planning Services: CM Consult Post Acute Care Choice: NA          DME Arranged: N/A DME Agency: NA       HH Arranged: NA          Social Determinants of Health (SDOH) Interventions SDOH Screenings   Food Insecurity: No Food Insecurity (07/26/2023)  Housing: Low Risk  (07/26/2023)  Transportation Needs: No Transportation Needs (07/26/2023)  Utilities: Not At Risk (07/26/2023)  Tobacco Use: Low Risk  (07/25/2023)     Readmission Risk Interventions     No data to display

## 2023-07-31 NOTE — Progress Notes (Signed)
Discharge orders in for today. SW informed via secure chat. SW stated there is no decision yet from the appeal. Per MD " She is medically stable for DC as soon as the appeal process is over, she will be discharged: home vs SNF. Hence the DC orders. "

## 2023-07-31 NOTE — Progress Notes (Signed)
Physical Therapy Treatment Patient Details Name: Alicia Vang MRN: [id number] DOB: 15-Apr-1927 Today's Date: 07/31/2023   History of Present Illness 89 yo female [facility] with dyspnea, RLE swelling with workup for HF. CXR shows bilat moderate pleural effusions. S/p L thoracentesis on 10/3. PMH includes OA, L THA 2019, DMII, HTN, hypokalemia, parkinsons.    PT Comments  PT focus of session on functional transfers, as pt transfers multiple times/day at baseline. Pt overall requiring max +1 assist for repeated transfers and bed mobility, which is an improvement from eval. Pt remains weak and is not at baseline, would benefit from short-term rehab post-acutely to address deficits and return to PLOF.     If plan is discharge home, recommend the following: Direct supervision/assist for medications management;Assistance with cooking/housework;Assistance with feeding;Assist for transportation;Help with stairs or ramp for entrance;A lot of help with walking and/or transfers;A lot of help with bathing/dressing/bathroom   Can travel by private vehicle        Equipment Recommendations  None recommended by PT    Recommendations for Other Services       Precautions / Restrictions Precautions Precautions: Fall Restrictions Weight Bearing Restrictions: No     Mobility  Bed Mobility Overal bed mobility: Needs Assistance Bed Mobility: Sit to Supine       Sit to supine: Max assist   General bed mobility comments: pt up in chair upon PT arrival to room, assist for trunk lower, LE lift into bed, and boost up at end of session    Transfers Overall transfer level: Needs assistance Equipment used: 1 person hand held assist Transfers: Sit to/from Stand, Bed to chair/wheelchair/BSC Sit to Stand: Max assist     Squat pivot transfers: Max assist     General transfer comment: assist for power up, rise, steady. Stand x2, unable to tolerates taking steps so opted for squat  pivot    Ambulation/Gait                   Stairs             Wheelchair Mobility     Tilt Bed    Modified Rankin (Stroke Patients Only)       Balance Overall balance assessment: Needs assistance Sitting-balance support: Bilateral upper extremity supported, Feet supported Sitting balance-[name] Scale: Fair     Standing balance support: Bilateral upper extremity supported, During functional activity Standing balance-[name] Scale: Zero Standing balance comment: max assist                            Cognition Arousal: Alert Behavior During Therapy: Flat affect Overall Cognitive Status: Difficult to assess                                 General Comments: HOH, limited verbalization. Most successful at yes/no today. Poor initiation and sequencing        Exercises      General Comments General comments (skin integrity, edema, etc.): vss      Pertinent Vitals/Pain Pain Assessment Pain Assessment: Faces Faces Pain Scale: Hurts little more Pain Location: generalized Pain Descriptors / Indicators: Discomfort, Guarding Pain Intervention(s): Limited activity within patient's tolerance, Monitored during session, Repositioned    Home Living  Prior Function            PT Goals (current goals can now be found in the care plan section) Acute Rehab PT Goals Patient Stated Goal: return to PLOF, which is transfer-level +1 PT Goal Formulation: With patient/family Time For Goal Achievement: 08/10/23 Potential to Achieve Goals: Good Progress towards PT goals: Progressing toward goals    Frequency    Min 1X/week      PT Plan      Co-evaluation              AM-PAC PT "6 Clicks" Mobility   Outcome Measure  Help needed turning from your back to your side while in a flat bed without using bedrails?: A Lot Help needed moving from lying on your back to sitting on the side of a flat bed  without using bedrails?: A Lot Help needed moving to and from a bed to a chair (including a wheelchair)?: A Lot Help needed standing up from a chair using your arms (e.g., wheelchair or bedside chair)?: Total Help needed to walk in hospital room?: Total Help needed climbing 3-5 steps with a railing? : Total 6 Click Score: 9    End of Session Equipment Utilized During Treatment: Gait belt Activity Tolerance: Patient tolerated treatment well;Patient limited by fatigue Patient left: in bed;with call bell/phone within reach;with family/visitor present;with bed alarm set Nurse Communication: Mobility status PT Visit Diagnosis: Other abnormalities of gait and mobility (R26.89);Muscle weakness (generalized) (M62.81)     Time: 1191-4782 PT Time Calculation (min) (ACUTE ONLY): 22 min  Charges:    $Therapeutic Activity: 8-22 mins PT General Charges $$ ACUTE PT VISIT: 1 Visit                     Marye Round, PT DPT Acute Rehabilitation Services Secure Chat Preferred  Office [phone]    Booker Bhatnagar Sheliah Plane 07/31/2023, 4:23 PM

## 2023-07-31 NOTE — Progress Notes (Signed)
Unable to give medications this morning. Patient did not open her mouth. Family not at bedside at this time.

## 2023-07-31 NOTE — Progress Notes (Signed)
Patient's daughter at bedside. States her mother usually wake up around 11am -12pm at home. Will attempt to give medications then. Patient sleeping at this time.

## 2023-07-31 NOTE — Progress Notes (Signed)
[facility] Liaison Note:  Notified by [facility] manager of patient/family request for [facility] Palliative services at home after discharge.   Referral submitted for Outpatient Palliative Care services post discharge.  Please call with any hospice or outpatient palliative care related questions.   Thank you for the opportunity to participate in this patient's care.   Glenna Fellows, BSN, RN, OCN [employer] [phone]

## 2023-07-31 NOTE — Discharge Summary (Signed)
PERO     Full                                                        +---------+---------------+---------+-----------+----------+--------------+   +----+---------------+---------+-----------+----------+--------------+ LEFTCompressibilityPhasicitySpontaneityPropertiesThrombus Aging +----+---------------+---------+-----------+----------+--------------+ CFV Full           Yes      No                                  +----+---------------+---------+-----------+----------+--------------+    Summary: RIGHT: - There is no evidence of deep vein thrombosis in the lower extremity.  - No cystic structure found in the popliteal fossa.   LEFT: - No evidence of common femoral vein obstruction.   *See table(s) above for measurements and observations. Electronically signed by Heath Lark on 07/27/2023 at 6:48:03 PM.    Final    ECHOCARDIOGRAM COMPLETE  Result Date: 07/26/2023    ECHOCARDIOGRAM REPORT   Patient Name:   Alicia Vang Date of Exam: 07/26/2023 Medical Rec #:  [id number]           Height:       66.0 in Accession #:    [id number]          Weight:       89.5 lb Date of Birth:  [birth year]          BSA:          1.421 m Patient Age:    89 years            BP:           163/83 mmHg Patient Gender: F                   HR:           54 bpm. Exam Location:  Inpatient Procedure: 2D Echo, Cardiac Doppler and Color Doppler Indications:    CHF  History:        Patient has no prior history of Echocardiogram examinations.                 Arrythmias:Atrial Fibrillation, Signs/Symptoms:Edema and                 Shortness of Breath; Risk Factors:Hypertension and Diabetes.                 Parkinson's.  Sonographer:    Milda Smart Referring Phys: [phone] DEBBY CROSLEY  Sonographer Comments: Image acquisition challenging due to patient body habitus and Image acquisition challenging due to respiratory motion. IMPRESSIONS  1. Left ventricular ejection fraction, by estimation, is 25 to 30%. The left ventricle has severely decreased function. The left ventricle demonstrates global hypokinesis. There is severe concentric left ventricular hypertrophy. Elevated left atrial pressure. 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  2. Right ventricular systolic function is mildly reduced. The right ventricular size is normal. Mildly increased right ventricular wall thickness.  3. Left atrial size was mildly dilated.  4. Right atrial size was mildly dilated.  5. Large pleural effusion.  6. The mitral valve is normal in structure. Mild to moderate mitral valve regurgitation.  7. The aortic valve is normal in structure. Aortic valve  regurgitation is mild. FINDINGS  Left Ventricle: Left ventricular ejection fraction, by  PERO     Full                                                        +---------+---------------+---------+-----------+----------+--------------+   +----+---------------+---------+-----------+----------+--------------+ LEFTCompressibilityPhasicitySpontaneityPropertiesThrombus Aging +----+---------------+---------+-----------+----------+--------------+ CFV Full           Yes      No                                  +----+---------------+---------+-----------+----------+--------------+    Summary: RIGHT: - There is no evidence of deep vein thrombosis in the lower extremity.  - No cystic structure found in the popliteal fossa.   LEFT: - No evidence of common femoral vein obstruction.   *See table(s) above for measurements and observations. Electronically signed by Heath Lark on 07/27/2023 at 6:48:03 PM.    Final    ECHOCARDIOGRAM COMPLETE  Result Date: 07/26/2023    ECHOCARDIOGRAM REPORT   Patient Name:   Alicia Vang Date of Exam: 07/26/2023 Medical Rec #:  [id number]           Height:       66.0 in Accession #:    [id number]          Weight:       89.5 lb Date of Birth:  [birth year]          BSA:          1.421 m Patient Age:    89 years            BP:           163/83 mmHg Patient Gender: F                   HR:           54 bpm. Exam Location:  Inpatient Procedure: 2D Echo, Cardiac Doppler and Color Doppler Indications:    CHF  History:        Patient has no prior history of Echocardiogram examinations.                 Arrythmias:Atrial Fibrillation, Signs/Symptoms:Edema and                 Shortness of Breath; Risk Factors:Hypertension and Diabetes.                 Parkinson's.  Sonographer:    Milda Smart Referring Phys: [phone] DEBBY CROSLEY  Sonographer Comments: Image acquisition challenging due to patient body habitus and Image acquisition challenging due to respiratory motion. IMPRESSIONS  1. Left ventricular ejection fraction, by estimation, is 25 to 30%. The left ventricle has severely decreased function. The left ventricle demonstrates global hypokinesis. There is severe concentric left ventricular hypertrophy. Elevated left atrial pressure. 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  2. Right ventricular systolic function is mildly reduced. The right ventricular size is normal. Mildly increased right ventricular wall thickness.  3. Left atrial size was mildly dilated.  4. Right atrial size was mildly dilated.  5. Large pleural effusion.  6. The mitral valve is normal in structure. Mild to moderate mitral valve regurgitation.  7. The aortic valve is normal in structure. Aortic valve  regurgitation is mild. FINDINGS  Left Ventricle: Left ventricular ejection fraction, by  Physician Discharge Summary  Alicia Vang ZOX:[id number] DOB: 1927-09-13 DOA: 07/25/2023  PCP: Knox Royalty, MD  Admit date: 07/25/2023 Discharge date: 07/31/2023  Admitted From: Home Disposition: Home.  Insurance denied SNF placement.  Recommendations for Outpatient Follow-up:  Follow up with PCP in 1 week with repeat CBC/BMP Follow up in ED if symptoms worsen or new appear   Home Health: Home health PT/OT Equipment/Devices: None  Discharge Condition: Guarded to poor CODE STATUS: Full Diet recommendation: Heart healthy/carb modified/fluid restriction of up to 1200 cc a day.  Diet as per SLP recommendations.  Brief/Interim Summary: 89 year old female with past medical history of hypertension, Parkinson's disease, mostly bedbound since hip surgery approximately a year ago presented with worsening shortness of breath and lower extremity swelling. On presentation, BNP was more than 4500. COVID 19/influenza/RSV PCR negative. Chest x-ray showed bilateral moderate pleural effusions. She was started on IV Lasix. Cardiology was consulted. Echo showed EF of 25 to 30%. Palliative care consulted for goals of care discussion.  During the hospitalization, she has already been switched to oral Lasix and cardiology has adjusted medications and has signed off.  She remains full code despite multiple palliative care discussions.  PT recommended SNF placement but insurance denied SNF placement.  She will be discharged home today with home health PT/OT.  Discharge Diagnoses:  Possible acute systolic congestive heart failure with moderate bilateral pleural effusions  Essential hypertension -Echo showed EF of 25 to 30% with global hypokinesis and findings concerning for possible infiltrative cardiomyopathy such as amyloidosis -Cardiology has already signed off: Treated with IV Lasix per cardiology has already switched her to oral Lasix.  Cardiology has signed off.  Continue losartan and metoprolol.   Continue diet and fluid restriction.  Negative fluid balance of 8914 cc since admission.   -Status post ultrasound-guided left-sided thoracentesis and removal of 400 cc clear yellow fluid. -Discharge patient home today on oral Lasix, losartan, metoprolol succinate and spironolactone as per cardiology recommendations.  Outpatient follow-up with cardiology.     Hypokalemia -Improved   Dysphagia -Diet as per SLP recommendations: Currently on dysphagia 1 diet.   Diabetes mellitus type 2 with hypoglycemia - A1c 6.9.  Blood sugars intermittently on the lower side.  Carb modified diet.  Will hold glipizide till reevaluation by PCP.   Prolonged Qtc -QTc 526 on presentation.  QTc improving to 475. -Outpatient follow-up with cardiology.Physical deconditioning Goals of care -Patient apparently has been mostly bedbound for almost a year after hip surgery.  PT recommended SNF placement but insurance denied SNF placement.  Will need home health PT/OT -Overall prognosis is guarded to poor.  Palliative care following.  Patient remains full code.  Cardiology also recommending conservative management and palliative care discussions.  Will need outpatient palliative care follow-up.   Severe malnutrition -Follow nutrition recommendations   Stage II pressure injury of sacrum: POA -follow wound care RN's recommendations   Discharge Instructions  Discharge Instructions     Amb Referral to Palliative Care   Complete by: As directed    Ambulatory referral to Cardiology   Complete by: As directed    Diet - low sodium heart healthy   Complete by: As directed    Discharge wound care:   Complete by: As directed    As per wound care RN's recommendations   Increase activity slowly   Complete by: As directed       Allergies as of 07/31/2023       Reactions   Amlodipine Swelling  PERO     Full                                                        +---------+---------------+---------+-----------+----------+--------------+   +----+---------------+---------+-----------+----------+--------------+ LEFTCompressibilityPhasicitySpontaneityPropertiesThrombus Aging +----+---------------+---------+-----------+----------+--------------+ CFV Full           Yes      No                                  +----+---------------+---------+-----------+----------+--------------+    Summary: RIGHT: - There is no evidence of deep vein thrombosis in the lower extremity.  - No cystic structure found in the popliteal fossa.   LEFT: - No evidence of common femoral vein obstruction.   *See table(s) above for measurements and observations. Electronically signed by Heath Lark on 07/27/2023 at 6:48:03 PM.    Final    ECHOCARDIOGRAM COMPLETE  Result Date: 07/26/2023    ECHOCARDIOGRAM REPORT   Patient Name:   Alicia Vang Date of Exam: 07/26/2023 Medical Rec #:  [id number]           Height:       66.0 in Accession #:    [id number]          Weight:       89.5 lb Date of Birth:  [birth year]          BSA:          1.421 m Patient Age:    89 years            BP:           163/83 mmHg Patient Gender: F                   HR:           54 bpm. Exam Location:  Inpatient Procedure: 2D Echo, Cardiac Doppler and Color Doppler Indications:    CHF  History:        Patient has no prior history of Echocardiogram examinations.                 Arrythmias:Atrial Fibrillation, Signs/Symptoms:Edema and                 Shortness of Breath; Risk Factors:Hypertension and Diabetes.                 Parkinson's.  Sonographer:    Milda Smart Referring Phys: [phone] DEBBY CROSLEY  Sonographer Comments: Image acquisition challenging due to patient body habitus and Image acquisition challenging due to respiratory motion. IMPRESSIONS  1. Left ventricular ejection fraction, by estimation, is 25 to 30%. The left ventricle has severely decreased function. The left ventricle demonstrates global hypokinesis. There is severe concentric left ventricular hypertrophy. Elevated left atrial pressure. 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  2. Right ventricular systolic function is mildly reduced. The right ventricular size is normal. Mildly increased right ventricular wall thickness.  3. Left atrial size was mildly dilated.  4. Right atrial size was mildly dilated.  5. Large pleural effusion.  6. The mitral valve is normal in structure. Mild to moderate mitral valve regurgitation.  7. The aortic valve is normal in structure. Aortic valve  regurgitation is mild. FINDINGS  Left Ventricle: Left ventricular ejection fraction, by  Physician Discharge Summary  Alicia Vang ZOX:[id number] DOB: 1927-09-13 DOA: 07/25/2023  PCP: Knox Royalty, MD  Admit date: 07/25/2023 Discharge date: 07/31/2023  Admitted From: Home Disposition: Home.  Insurance denied SNF placement.  Recommendations for Outpatient Follow-up:  Follow up with PCP in 1 week with repeat CBC/BMP Follow up in ED if symptoms worsen or new appear   Home Health: Home health PT/OT Equipment/Devices: None  Discharge Condition: Guarded to poor CODE STATUS: Full Diet recommendation: Heart healthy/carb modified/fluid restriction of up to 1200 cc a day.  Diet as per SLP recommendations.  Brief/Interim Summary: 89 year old female with past medical history of hypertension, Parkinson's disease, mostly bedbound since hip surgery approximately a year ago presented with worsening shortness of breath and lower extremity swelling. On presentation, BNP was more than 4500. COVID 19/influenza/RSV PCR negative. Chest x-ray showed bilateral moderate pleural effusions. She was started on IV Lasix. Cardiology was consulted. Echo showed EF of 25 to 30%. Palliative care consulted for goals of care discussion.  During the hospitalization, she has already been switched to oral Lasix and cardiology has adjusted medications and has signed off.  She remains full code despite multiple palliative care discussions.  PT recommended SNF placement but insurance denied SNF placement.  She will be discharged home today with home health PT/OT.  Discharge Diagnoses:  Possible acute systolic congestive heart failure with moderate bilateral pleural effusions  Essential hypertension -Echo showed EF of 25 to 30% with global hypokinesis and findings concerning for possible infiltrative cardiomyopathy such as amyloidosis -Cardiology has already signed off: Treated with IV Lasix per cardiology has already switched her to oral Lasix.  Cardiology has signed off.  Continue losartan and metoprolol.   Continue diet and fluid restriction.  Negative fluid balance of 8914 cc since admission.   -Status post ultrasound-guided left-sided thoracentesis and removal of 400 cc clear yellow fluid. -Discharge patient home today on oral Lasix, losartan, metoprolol succinate and spironolactone as per cardiology recommendations.  Outpatient follow-up with cardiology.     Hypokalemia -Improved   Dysphagia -Diet as per SLP recommendations: Currently on dysphagia 1 diet.   Diabetes mellitus type 2 with hypoglycemia - A1c 6.9.  Blood sugars intermittently on the lower side.  Carb modified diet.  Will hold glipizide till reevaluation by PCP.   Prolonged Qtc -QTc 526 on presentation.  QTc improving to 475. -Outpatient follow-up with cardiology.Physical deconditioning Goals of care -Patient apparently has been mostly bedbound for almost a year after hip surgery.  PT recommended SNF placement but insurance denied SNF placement.  Will need home health PT/OT -Overall prognosis is guarded to poor.  Palliative care following.  Patient remains full code.  Cardiology also recommending conservative management and palliative care discussions.  Will need outpatient palliative care follow-up.   Severe malnutrition -Follow nutrition recommendations   Stage II pressure injury of sacrum: POA -follow wound care RN's recommendations   Discharge Instructions  Discharge Instructions     Amb Referral to Palliative Care   Complete by: As directed    Ambulatory referral to Cardiology   Complete by: As directed    Diet - low sodium heart healthy   Complete by: As directed    Discharge wound care:   Complete by: As directed    As per wound care RN's recommendations   Increase activity slowly   Complete by: As directed       Allergies as of 07/31/2023       Reactions   Amlodipine Swelling  Swelling in feet        Medication List     STOP taking these medications    glimepiride 4 MG tablet Commonly  known as: AMARYL   HYDROcodone-acetaminophen 5-325 MG tablet Commonly known as: NORCO/VICODIN   Potassium Chloride ER 20 MEQ Tbcr       TAKE these medications    acetaminophen 325 MG tablet Commonly known as: TYLENOL Take 2 tablets (650 mg total) by mouth every 6 (six) hours as needed for mild pain or headache (fever >/= 101).   brinzolamide 1 % ophthalmic suspension Commonly known as: AZOPT Place 1 drop into both eyes at bedtime.   diclofenac Sodium 1 % Gel Commonly known as: VOLTAREN Apply 2 g topically 4 (four) times daily as needed (lower back pain as needed).   docusate sodium 100 MG capsule Commonly known as: COLACE Take 1 capsule (100 mg total) by mouth 2 (two) times daily.   furosemide 40 MG tablet Commonly known as: LASIX Take 1 tablet (40 mg total) by mouth daily. Start taking on: August 01, 2023   latanoprost 0.005 % ophthalmic solution Commonly known as: XALATAN Place 1 drop into both eyes at bedtime.   losartan 100 MG tablet Commonly known as: COZAAR Take 100 mg by mouth daily.   Metoprolol Succinate 50 MG Cs24 Take 50 mg by mouth daily.   polyethylene glycol 17 g packet Commonly known as: MIRALAX / GLYCOLAX Take 17 g by mouth daily as needed for mild constipation.   spironolactone 25 MG tablet Commonly known as: ALDACTONE Take 0.5 tablets (12.5 mg total) by mouth daily. Start taking on: August 01, 2023   Vitamin D (Ergocalciferol) 1.25 MG (50000 UNIT) Caps capsule Commonly known as: DRISDOL Take 50,000 Units by mouth once a week.   Xiidra 5 % Soln Generic drug: Lifitegrast Place 1 drop into both eyes at bedtime.               Discharge Care Instructions  (From admission, onward)           Start     Ordered   07/31/23 0000  Discharge wound care:       Comments: As per wound care RN's recommendations   07/31/23 1051            Contact information for follow-up providers     Knox Royalty, MD. Schedule an appointment  as soon as possible for a visit in 1 week(s).   Specialty: Family Medicine Why: with repeat cbc/bmp Contact information: [address] [place] [address] [phone]              Contact information for after-discharge care     Destination     [facility] Preferred SNF .   Service: Skilled Nursing Contact information: [address] [place] [address] [phone]                    Allergies  Allergen Reactions   Amlodipine Swelling    Swelling in feet    Consultations: Cardiology/palliative care   Procedures/Studies: VAS [place] LOWER EXTREMITY VENOUS (DVT) (7a-7p)  Result Date: 07/27/2023  Lower Venous DVT Study Patient Name:  Alicia Vang  Date of Exam:   07/25/2023 Medical Rec #: [id number]            Accession #:    [id number] Date of Birth: 15-Mar-1927           Patient Gender: F Patient  PERO     Full                                                        +---------+---------------+---------+-----------+----------+--------------+   +----+---------------+---------+-----------+----------+--------------+ LEFTCompressibilityPhasicitySpontaneityPropertiesThrombus Aging +----+---------------+---------+-----------+----------+--------------+ CFV Full           Yes      No                                  +----+---------------+---------+-----------+----------+--------------+    Summary: RIGHT: - There is no evidence of deep vein thrombosis in the lower extremity.  - No cystic structure found in the popliteal fossa.   LEFT: - No evidence of common femoral vein obstruction.   *See table(s) above for measurements and observations. Electronically signed by Heath Lark on 07/27/2023 at 6:48:03 PM.    Final    ECHOCARDIOGRAM COMPLETE  Result Date: 07/26/2023    ECHOCARDIOGRAM REPORT   Patient Name:   Alicia Vang Date of Exam: 07/26/2023 Medical Rec #:  [id number]           Height:       66.0 in Accession #:    [id number]          Weight:       89.5 lb Date of Birth:  [birth year]          BSA:          1.421 m Patient Age:    89 years            BP:           163/83 mmHg Patient Gender: F                   HR:           54 bpm. Exam Location:  Inpatient Procedure: 2D Echo, Cardiac Doppler and Color Doppler Indications:    CHF  History:        Patient has no prior history of Echocardiogram examinations.                 Arrythmias:Atrial Fibrillation, Signs/Symptoms:Edema and                 Shortness of Breath; Risk Factors:Hypertension and Diabetes.                 Parkinson's.  Sonographer:    Milda Smart Referring Phys: [phone] DEBBY CROSLEY  Sonographer Comments: Image acquisition challenging due to patient body habitus and Image acquisition challenging due to respiratory motion. IMPRESSIONS  1. Left ventricular ejection fraction, by estimation, is 25 to 30%. The left ventricle has severely decreased function. The left ventricle demonstrates global hypokinesis. There is severe concentric left ventricular hypertrophy. Elevated left atrial pressure. 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  2. Right ventricular systolic function is mildly reduced. The right ventricular size is normal. Mildly increased right ventricular wall thickness.  3. Left atrial size was mildly dilated.  4. Right atrial size was mildly dilated.  5. Large pleural effusion.  6. The mitral valve is normal in structure. Mild to moderate mitral valve regurgitation.  7. The aortic valve is normal in structure. Aortic valve  regurgitation is mild. FINDINGS  Left Ventricle: Left ventricular ejection fraction, by  Swelling in feet        Medication List     STOP taking these medications    glimepiride 4 MG tablet Commonly  known as: AMARYL   HYDROcodone-acetaminophen 5-325 MG tablet Commonly known as: NORCO/VICODIN   Potassium Chloride ER 20 MEQ Tbcr       TAKE these medications    acetaminophen 325 MG tablet Commonly known as: TYLENOL Take 2 tablets (650 mg total) by mouth every 6 (six) hours as needed for mild pain or headache (fever >/= 101).   brinzolamide 1 % ophthalmic suspension Commonly known as: AZOPT Place 1 drop into both eyes at bedtime.   diclofenac Sodium 1 % Gel Commonly known as: VOLTAREN Apply 2 g topically 4 (four) times daily as needed (lower back pain as needed).   docusate sodium 100 MG capsule Commonly known as: COLACE Take 1 capsule (100 mg total) by mouth 2 (two) times daily.   furosemide 40 MG tablet Commonly known as: LASIX Take 1 tablet (40 mg total) by mouth daily. Start taking on: August 01, 2023   latanoprost 0.005 % ophthalmic solution Commonly known as: XALATAN Place 1 drop into both eyes at bedtime.   losartan 100 MG tablet Commonly known as: COZAAR Take 100 mg by mouth daily.   Metoprolol Succinate 50 MG Cs24 Take 50 mg by mouth daily.   polyethylene glycol 17 g packet Commonly known as: MIRALAX / GLYCOLAX Take 17 g by mouth daily as needed for mild constipation.   spironolactone 25 MG tablet Commonly known as: ALDACTONE Take 0.5 tablets (12.5 mg total) by mouth daily. Start taking on: August 01, 2023   Vitamin D (Ergocalciferol) 1.25 MG (50000 UNIT) Caps capsule Commonly known as: DRISDOL Take 50,000 Units by mouth once a week.   Xiidra 5 % Soln Generic drug: Lifitegrast Place 1 drop into both eyes at bedtime.               Discharge Care Instructions  (From admission, onward)           Start     Ordered   07/31/23 0000  Discharge wound care:       Comments: As per wound care RN's recommendations   07/31/23 1051            Contact information for follow-up providers     Knox Royalty, MD. Schedule an appointment  as soon as possible for a visit in 1 week(s).   Specialty: Family Medicine Why: with repeat cbc/bmp Contact information: [address] [place] [address] [phone]              Contact information for after-discharge care     Destination     [facility] Preferred SNF .   Service: Skilled Nursing Contact information: [address] [place] [address] [phone]                    Allergies  Allergen Reactions   Amlodipine Swelling    Swelling in feet    Consultations: Cardiology/palliative care   Procedures/Studies: VAS [place] LOWER EXTREMITY VENOUS (DVT) (7a-7p)  Result Date: 07/27/2023  Lower Venous DVT Study Patient Name:  Alicia Vang  Date of Exam:   07/25/2023 Medical Rec #: [id number]            Accession #:    [id number] Date of Birth: 15-Mar-1927           Patient Gender: F Patient

## 2023-08-01 DIAGNOSIS — N1831 Chronic kidney disease, stage 3a: Secondary | ICD-10-CM | POA: Diagnosis not present

## 2023-08-01 DIAGNOSIS — I10 Essential (primary) hypertension: Secondary | ICD-10-CM | POA: Diagnosis not present

## 2023-08-01 DIAGNOSIS — E43 Unspecified severe protein-calorie malnutrition: Secondary | ICD-10-CM

## 2023-08-01 DIAGNOSIS — I5021 Acute systolic (congestive) heart failure: Secondary | ICD-10-CM | POA: Diagnosis not present

## 2023-08-01 DIAGNOSIS — G20A1 Parkinson's disease without dyskinesia, without mention of fluctuations: Secondary | ICD-10-CM

## 2023-08-01 DIAGNOSIS — J9601 Acute respiratory failure with hypoxia: Secondary | ICD-10-CM | POA: Insufficient documentation

## 2023-08-01 LAB — GLUCOSE, CAPILLARY
Glucose-Capillary: 108 mg/dL — ABNORMAL HIGH (ref 70–99)
Glucose-Capillary: 121 mg/dL — ABNORMAL HIGH (ref 70–99)
Glucose-Capillary: 220 mg/dL — ABNORMAL HIGH (ref 70–99)
Glucose-Capillary: 139 mg/dL — ABNORMAL HIGH (ref 70–99)

## 2023-08-01 MED ORDER — SODIUM CHLORIDE 0.9% FLUSH
3.0000 mL | Freq: Two times a day (BID) | INTRAVENOUS | Status: DC
  Administered 2023-08-01 – 2023-08-09 (×17): 3 mL via INTRAVENOUS

## 2023-08-01 NOTE — Assessment & Plan Note (Deleted)
Chronic.  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  Family seems in denial of pt's overall poor prognosis. Palliative care was consulted. Family resistant in accepting pt's diagnoses and overall poor outcome. Pt remains full scope of care and full code. I fully expect pt to do very poorly in the next few weeks to months.

## 2023-08-01 NOTE — Assessment & Plan Note (Deleted)
S/p thoracentesis on 07-26-2023 for 400 ml

## 2023-08-01 NOTE — Progress Notes (Signed)
Occupational Therapy Treatment Patient Details Name: Alicia Vang MRN: [id number] DOB: 01/05/1927 Today's Date: 08/01/2023   History of present illness 89 yo female presents to [facility] on 10/2 with dyspnea, RLE swelling with workup for HF. CXR shows bilat moderate pleural effusions. S/p L thoracentesis on 10/3. PMH includes OA, L THA 2019, DMII, HTN, hypokalemia, parkinsons.   OT comments  Pt progressing slowly toward established OT goals. Following commands intermittently; pt very hard of hearing affecting ability to follow commands in addition to cognition. Max A to get to EOB and fluctuating assist level for sitting balance at EOB ~12 minutes. Engaged in grooming at EOB with up to hand over hand assist. Patient will benefit from continued inpatient follow up therapy, <3 hours/day       If plan is discharge home, recommend the following:      Equipment Recommendations  Other (comment) (defer next venue)    Recommendations for Other Services      Precautions / Restrictions Precautions Precautions: Fall Precaution Comments: skin breakdown L scapula with redness to spinous processes Restrictions Weight Bearing Restrictions: No       Mobility Bed Mobility Overal bed mobility: Needs Assistance Bed Mobility: Sit to Supine, Supine to Sit     Supine to sit: Max assist Sit to supine: Max assist   General bed mobility comments: max A for all mobility; very slowed initiation    Transfers                         Balance Overall balance assessment: Needs assistance Sitting-balance support: Bilateral upper extremity supported, Feet supported Sitting balance-[name] Scale: Fair Sitting balance - Comments: sits EOB CGA x12 minutes, anterior bias with fatigue   Standing balance support: Bilateral upper extremity supported, During functional activity Standing balance-[name] Scale: Zero Standing balance comment: max assist                           ADL  either performed or assessed with clinical judgement   ADL Overall ADL's : Needs assistance/impaired     Grooming: Maximal assistance;Total assistance;Sitting;Wash/dry face Grooming Details (indicate cue type and reason): hand over hand A in part due to cognition                               General ADL Comments: Sitting EOB ~64 minutes    Extremity/Trunk Assessment Upper Extremity Assessment Upper Extremity Assessment: Generalized weakness   Lower Extremity Assessment Lower Extremity Assessment: Defer to PT evaluation        Vision       Perception     Praxis      Cognition Arousal: Alert Behavior During Therapy: Flat affect Overall Cognitive Status: Difficult to assess                                 General Comments: HOH, limited verbalization. Most successful at yes/no today. Poor initiation and sequencing        Exercises      Shoulder Instructions       General Comments VSS. HR 65 on arrival. 75 at EOB    Pertinent Vitals/ Pain       Pain Assessment Pain Assessment: Faces Faces Pain Scale: Hurts little more Pain Location: generalized Pain Descriptors / Indicators: Discomfort, Guarding Pain Intervention(s): Limited  activity within patient's tolerance, Monitored during session  Home Living                                          Prior Functioning/Environment              Frequency  Min 1X/week        Progress Toward Goals  OT Goals(current goals can now be found in the care plan section)     Acute Rehab OT Goals Patient Stated Goal: pt unable to state OT Goal Formulation: With family Time For Goal Achievement: 08/10/23 Potential to Achieve Goals: Fair ADL Goals Pt Will Perform Upper Body Bathing: with max assist;sitting Pt Will Perform Lower Body Bathing: with max assist;sitting/lateral leans;sit to/from stand Additional ADL Goal #1: Pt will complete bed mobility at max A +1 level to  prepare for EOB/OOB ADLs.  Plan      Co-evaluation                 AM-PAC OT "6 Clicks" Daily Activity     Outcome Measure   Help from another person eating meals?: A Little Help from another person taking care of personal grooming?: A Lot Help from another person toileting, which includes using toliet, bedpan, or urinal?: Total Help from another person bathing (including washing, rinsing, drying)?: Total Help from another person to put on and taking off regular upper body clothing?: A Lot Help from another person to put on and taking off regular lower body clothing?: Total 6 Click Score: 10    End of Session    OT Visit Diagnosis: Unsteadiness on feet (R26.81);History of falling (Z91.81);Muscle weakness (generalized) (M62.81);Other symptoms and signs involving cognitive function;Other symptoms and signs involving the nervous system (R29.898);Cognitive communication deficit (R41.841);Pain   Activity Tolerance Patient tolerated treatment well   Patient Left in bed;with call bell/phone within reach;with bed alarm set;with family/visitor present   Nurse Communication Mobility status        Time: 5621-3086 OT Time Calculation (min): 22 min  Charges: OT General Charges $OT Visit: 1 Visit OT Treatments $Self Care/Home Management : 8-22 mins  Tyler Deis, OTR/L [facility] Office: [phone]   Myrla Halsted 08/01/2023, 4:33 PM

## 2023-08-01 NOTE — Assessment & Plan Note (Deleted)
Patient remains on Toprol-XL 50 mg daily Due to rising Scr, pt's lasix, aldactone and losartan were stopped on 08-04-2023 due to poor oral intake.  Repeat BUN/Scr 19/1.02

## 2023-08-01 NOTE — Assessment & Plan Note (Addendum)
Patient was placed on insulin sliding scale for glucose cover and monitoring.  Her glucose remained stable.  At discharge will discontinue oral hypoglycemic agents to prevent hypoglycemia.  Patient with poor oral intake.

## 2023-08-01 NOTE — Assessment & Plan Note (Deleted)
Weaned to RA after thoracentesis and diuresis. Resolved.

## 2023-08-01 NOTE — Assessment & Plan Note (Signed)
Noted by RN on 07-26-2023 on admission assessment. Will consult wound care RN.

## 2023-08-01 NOTE — Hospital Course (Addendum)
Mrs. Alicia Vang was admitted to the hospital with the working diagnosis of heart failure exacerbation.   89 yo female with the past medical history of hypertension, parkinson's disease, bed bound related to cognitive impairment and hip surgery. On the day of admission she was noted gasping for air, prompting her family to call EMS. On her initial physical examination her blood pressure was 162/94, HR 57, RR 33 and 02 saturation 99%, lungs with no wheezing or rales, decreased breath sounds left lower lobe, heart with S1 and S2 present and regular, positive JVD, abdomen soft and not distended, no lower extremity edema.   Na 136, K 3,1 Cl 103 bicarbonate 25, glucose 255, bun 19 cr 1,0  Mag 2,0  AST 31, ALT 45  Wbc 5,2 hgb 11,2 plt 240  Sars covid 19 negative   Chest radiograph with hyperinflation, mild cardiomegaly, bilateral hilar vascular congestion, with bilateral pleural effusions, more left than right.   EKG 59 bpm, left axis deviation, left bundle branch block, sinus rhythm with poor R R wave progression, no significant ST segment or T wave changes. Positive LVH.   Patient was placed on furosemide for diuresis.  Right sided thoracentesis.  Noted to have severe swallow dysfunction.  10/17 clinically stable for discharge to SNF.  Code status has been change to DNR, and continue outpatient palliative care.

## 2023-08-01 NOTE — Assessment & Plan Note (Deleted)
Stable.  Euvolemic.  There is a question of whether pt has infiltrative cardiomyopathy(I.e. amyloid). Cardiology has reviewed case and decided that given pt's poor functional status, multiple co-morbidities, that invasive workup in not in patient's best interest. No further therapy or investigation is warranted.  Given increase in Scr, will stop aldactone and ARB.

## 2023-08-01 NOTE — TOC Progression Note (Signed)
Transition of Care [facility]) - Progression Note    Patient Details  Name: Alicia Vang MRN: [id number] Date of Birth: [birth year]  Transition of Care [facility]) CM/SW Contact  Dellie Burns [place], [place] Phone Number: 08/01/2023, 2:18 PM  Clinical Narrative:  voicemail left for Deb in [facility] appeals [phone] requesting return call re update for SNF auth. Will provide updates as available.   Dellie Burns, MSW, LCSW [phone] (coverage)       Expected Discharge Plan: Skilled Nursing Facility Barriers to Discharge: Continued Medical Work up  Expected Discharge Plan and Services In-house Referral: Clinical Social Work Discharge Planning Services: CM Consult Post Acute Care Choice: NA Living arrangements for the past 2 months: Single Family Home Expected Discharge Date: 07/31/23               DME Arranged: N/A DME Agency: NA       HH Arranged: NA           Social Determinants of Health (SDOH) Interventions SDOH Screenings   Food Insecurity: No Food Insecurity (07/26/2023)  Housing: Low Risk  (07/26/2023)  Transportation Needs: No Transportation Needs (07/26/2023)  Utilities: Not At Risk (07/26/2023)  Tobacco Use: Low Risk  (07/25/2023)    Readmission Risk Interventions     No data to display

## 2023-08-01 NOTE — Assessment & Plan Note (Deleted)
Admitted for acute CHF. Echo shows LVEF of 25 to 30%.  Pt with parkinson's disease. Mentation is quite poor. Possibly dementia-related to parkinson's. Medically stable for discharge. Reported to me that family is appealing DC decision.  Pt on RA. Lying supine. No respiratory distress. Pt has received maximum inpatient hospital benefit. Repeat CMP, Mg in AM. Pt is not receiving any IV medications. Pt is medically stable for discharge.  Cardiology recommended medical management. They will not pursue any further workup.  08-02-2023 BUN/Scr stable (12/0.9) 08-05-2023 due to poor oral intake and rising Scr, her lasix, ARB and aldactone were stoppedon 08-04-2023. Repeat BUN/Scr now 19/1.02. will leave off ARB and diuretics.  08-07-2023  BUN/Scr 29/1.08. will need RN/NT to push po fluids.

## 2023-08-01 NOTE — Progress Notes (Signed)
PROGRESS NOTE    Alicia Vang  BJY:[id number] DOB: December 05, 1926 DOA: 07/25/2023 PCP: Knox Royalty, MD  Subjective: Patient seen and examined.  No family at bedside.  Reportedly family is appealing patient's discharge decision.  Patient has received maximum hospital benefit.  He is on room air.  Not receiving any IV medications.  Medically stable for discharge.   Hospital Course: HPI: This is a 89 year old female who lives at home with her daughter.  Patient has been bedbound since hip surgery approximately a year ago.  Her past medical history significant for HTN, Parkinson's disease.  Approximately a week ago her daughter noted patient began breathing heavier.  The PCP was contacted, noted directed to monitor patient's oxygen.  Patient's oxygen has been good approximate 95% and average on room air.  Yesterday it was noted patient appeared to be gasping for breath while talking.  SBP check was over 200.  Last send patient had a rough time with breathing, eventually stating she did not feel right.  Appetite has been low, there is no reports of fevers or chills.  Patient does not complain of chest pains.  Daughter repeats swelling on the right lower extremity.  She additionally states the patient occasionally chokes when she is not on food or drinks water, this has been slightly worse.  The patient was states to the ER.   In the ER patient hemodynamically stable.  Potassium 3.1, serum glucose 262, BNP >4,500. COVID-negative. CXR moderate B/L fluid effusion.  Patient mildly hypoxic satting 90-91%.  She has been placed on 2 L oxygen, satting 100%.    Significant Events: Admitted 07/25/2023 for new CHF   Significant Labs: Admitting BNP >4500  Significant Imaging Studies: 07-25-2023 CXR shows moderate bilateral pleural effusions  Antibiotic Therapy: Anti-infectives (From admission, onward)    None       Procedures: 07-26-2023 left side thoracentesis for 400  ml  Consultants: Cardiology Palliative care    Assessment and Plan: * Acute systolic heart failure (HCC) Admitted for acute CHF. Echo shows LVEF of 25 to 30%.  Pt with parkinson's disease. Mentation is quite poor. Possibly dementia-related to parkinson's. Medically stable for discharge. Reported to me that family is appealing DC decision.  Pt on RA. Lying supine. No respiratory distress. Pt has received maximum inpatient hospital benefit. Repeat CMP, Mg in AM. Pt is not receiving any IV medications. Pt is medically stable for discharge.  Cardiology recommended medical management. They will not pursue any further workup.  Dilated cardiomyopathy (HCC) Stable.  Euvolemic.  Chronic kidney disease, stage 3a (HCC) Baseline creatinine approximately 1.0.  Does not have a primary care provider within the [facility] health system.  No records available from PCP.  Primary hypertension Patient remains on Aldactone 12.5 mg daily, Toprol-XL 50 mg daily, Cozaar 100 mg daily, Lasix 40 mg daily.   Check CMP in the morning.  Parkinson's disease (HCC) Chronic.  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  Type 2 diabetes mellitus without complication, without long-term current use of insulin (HCC) Remains on sliding scale insulin.  Admission A1c is 6.9%. Stable.  Acute hypoxic respiratory failure (HCC) Weaned to RA after thoracentesis and diuresis. Resolved.  Pressure injury of skin Noted by RN on 07-26-2023 on admission assessment. Will consult wound care RN.  Protein-calorie malnutrition, severe See by RD on 07-27-2023.  Severe Malnutrition related to chronic illness (CHF, Parkinson's) as evidenced by severe fat depletion, severe muscle depletion    Pleural effusion on left S/p thoracentesis on 07-26-2023 for  400 ml   DVT prophylaxis: enoxaparin (LOVENOX) injection 30 mg Start: 07/30/23 1100    Code Status: Full Code Family Communication:  no family at bedside Disposition Plan: home vs  SNF Reason for continuing need for hospitalization: medically stable for discharge.  Objective: Vitals:   08/01/23 0405 08/01/23 0742 08/01/23 1113 08/01/23 1500  BP: (!) 146/86 135/76 (!) 151/49   Pulse: 62 66 61 62  Resp: 20 20 18  (!) 22  Temp: 97.6 F (36.4 C) 97.6 F (36.4 C) (P) 98.1 F (36.7 C)   TempSrc: Oral Oral (P) Oral   SpO2: 96% 99% 97% 98%  Weight: 36.4 kg     Height:        Intake/Output Summary (Last 24 hours) at 08/01/2023 1806 Last data filed at 08/01/2023 0411 Gross per 24 hour  Intake 130 ml  Output 150 ml  Net -20 ml   Filed Weights   07/31/23 0039 07/31/23 0500 08/01/23 0405  Weight: 40.4 kg 40.4 kg 36.4 kg    Examination:  Physical Exam Vitals and nursing note reviewed.  Constitutional:      Comments: Frail and elderly appearing female. Curled up into fetal position. Lying right lateral recumbent.  HENT:     Head: Normocephalic and atraumatic.  Cardiovascular:     Rate and Rhythm: Normal rate and regular rhythm.  Pulmonary:     Effort: Pulmonary effort is normal.  Abdominal:     General: Bowel sounds are normal. There is no distension.     Palpations: Abdomen is soft.  Musculoskeletal:     Right lower leg: No edema.     Left lower leg: No edema.  Skin:    Capillary Refill: Capillary refill takes less than 2 seconds.  Neurological:     Comments: No verbal     Data Reviewed: I have personally reviewed following labs and imaging studies  CBC: Recent Labs  Lab 07/26/23 0320  WBC 5.3  NEUTROABS 3.9  HGB 12.0  HCT 37.2  MCV 90.5  PLT 245   Basic Metabolic Panel: Recent Labs  Lab 07/27/23 0409 07/28/23 0323 07/29/23 0319 07/30/23 0359 07/31/23 0335  NA 144 141 139 137 136  K 3.6 3.3* 4.3 3.8 3.5  CL 101 100 98 96* 96*  CO2 30 32 29 28 29   GLUCOSE 74 66* 106* 112* 88  BUN 16 13 13 15 17   CREATININE 0.98 0.87 1.01* 0.99 0.93  CALCIUM 8.8* 8.8* 8.8* 8.6* 8.4*  MG 1.9 2.1 2.1 2.2 2.2   GFR: Estimated Creatinine  Clearance: 21.7 mL/min (by C-G formula based on SCr of 0.93 mg/dL).  CBG: Recent Labs  Lab 07/31/23 1555 07/31/23 2117 08/01/23 0623 08/01/23 1110 08/01/23 1507  GLUCAP 174* 208* 108* 121* 220*    Recent Results (from the past 240 hour(s))  Resp panel by RT-PCR (RSV, Flu A&B, Covid) Anterior Nasal Swab     Status: None   Collection Time: 07/25/23  4:21 PM   Specimen: Anterior Nasal Swab  Result Value Ref Range Status   SARS Coronavirus 2 by RT PCR NEGATIVE NEGATIVE Final   Influenza A by PCR NEGATIVE NEGATIVE Final   Influenza B by PCR NEGATIVE NEGATIVE Final    Comment: (NOTE) The Xpert Xpress SARS-CoV-2/FLU/RSV plus assay is intended as an aid in the diagnosis of influenza from Nasopharyngeal swab specimens and should not be used as a sole basis for treatment. Nasal washings and aspirates are unacceptable for Xpert Xpress SARS-CoV-2/FLU/RSV testing.  Fact Sheet for  Patients: BloggerCourse.com  Fact Sheet for Healthcare Providers: SeriousBroker.it  This test is not yet approved or cleared by the [place] FDA and has been authorized for detection and/or diagnosis of SARS-CoV-2 by FDA under an Emergency Use Authorization (EUA). This EUA will remain in effect (meaning this test can be used) for the duration of the COVID-19 declaration under Section 564(b)(1) of the Act, 21 U.S.C. section 360bbb-3(b)(1), unless the authorization is terminated or revoked.     Resp Syncytial Virus by PCR NEGATIVE NEGATIVE Final    Comment: (NOTE) Fact Sheet for Patients: BloggerCourse.com  Fact Sheet for Healthcare Providers: SeriousBroker.it  This test is not yet approved or cleared by the [place] FDA and has been authorized for detection and/or diagnosis of SARS-CoV-2 by FDA under an Emergency Use Authorization (EUA). This EUA will remain in effect (meaning this test can be  used) for the duration of the COVID-19 declaration under Section 564(b)(1) of the Act, 21 U.S.C. section 360bbb-3(b)(1), unless the authorization is terminated or revoked.  Performed at [facility], [address], [place], [place] [address]      Radiology Studies: No results found.  Scheduled Meds:  brinzolamide  1 drop Both Eyes QHS   enoxaparin (LOVENOX) injection  30 mg Subcutaneous Q24H   feeding supplement  1 Container Oral TID BM   furosemide  40 mg Oral Daily   influenza vaccine adjuvanted  0.5 mL Intramuscular Tomorrow-1000   insulin aspart  0-15 Units Subcutaneous TID AC & HS   latanoprost  1 drop Both Eyes QHS   losartan  100 mg Oral Daily   metoprolol succinate  50 mg Oral Daily   multivitamin with minerals  1 tablet Oral Daily   sodium chloride flush  3 mL Intravenous Q12H   spironolactone  12.5 mg Oral Daily   Vitamin D (Ergocalciferol)  50,000 Units Oral Weekly   Continuous Infusions:   LOS: 7 days   Time spent: 40 minutes  Carollee Herter, DO  [facility]  08/01/2023, 6:06 PM

## 2023-08-01 NOTE — Assessment & Plan Note (Deleted)
Baseline creatinine approximately 1.0.  Does not have a primary care provider within the [facility] health system.  No records available from PCP.  08-04-2023. Increase in Scr to 1.26. will stop aldactone and ARB due to poor po intake. Repeat CMP in AM. 08-05-2023. Scr back down to 1.02. BUN 19. Keep off ARB, lasix and aldactone. 08-07-2023 BUN/Scr 29/1.08. asked RN/NT to push PO fluids.

## 2023-08-01 NOTE — Assessment & Plan Note (Addendum)
Severe Malnutrition related to chronic illness (CHF, Parkinson's) as evidenced by severe fat depletion, severe muscle depletion

## 2023-08-01 NOTE — Subjective & Objective (Addendum)
Patient seen and examined.  No family at bedside.  ST reported yesterday that dtr was at the hospital. CM note reports that pt's appeal was successful but pt lost her bed at [facility]. Medically stable for discharge.  No change since yesterday.  Lasix, ARB, aldactone stopped due to poor oral intake. Scr improved to 1.0 today.

## 2023-08-02 DIAGNOSIS — I420 Dilated cardiomyopathy: Secondary | ICD-10-CM | POA: Diagnosis not present

## 2023-08-02 DIAGNOSIS — L89152 Pressure ulcer of sacral region, stage 2: Secondary | ICD-10-CM

## 2023-08-02 DIAGNOSIS — I5021 Acute systolic (congestive) heart failure: Secondary | ICD-10-CM | POA: Diagnosis not present

## 2023-08-02 DIAGNOSIS — N1831 Chronic kidney disease, stage 3a: Secondary | ICD-10-CM | POA: Diagnosis not present

## 2023-08-02 DIAGNOSIS — G20A1 Parkinson's disease without dyskinesia, without mention of fluctuations: Secondary | ICD-10-CM | POA: Diagnosis not present

## 2023-08-02 LAB — COMPREHENSIVE METABOLIC PANEL
ALT: 23 U/L (ref 0–44)
AST: 23 U/L (ref 15–41)
Albumin: 3.2 g/dL — ABNORMAL LOW (ref 3.5–5.0)
Alkaline Phosphatase: 53 U/L (ref 38–126)
Anion gap: 10 (ref 5–15)
BUN: 12 mg/dL (ref 8–23)
CO2: 29 mmol/L (ref 22–32)
Calcium: 8.8 mg/dL — ABNORMAL LOW (ref 8.9–10.3)
Chloride: 96 mmol/L — ABNORMAL LOW (ref 98–111)
Creatinine, Ser: 0.95 mg/dL (ref 0.44–1.00)
GFR, Estimated: 56 mL/min — ABNORMAL LOW (ref >60)
Glucose, Bld: 168 mg/dL — ABNORMAL HIGH (ref 70–99)
Potassium: 3.9 mmol/L (ref 3.5–5.1)
Sodium: 135 mmol/L (ref 135–145)
Total Bilirubin: 1.1 mg/dL (ref 0.3–1.2)
Total Protein: 6.4 g/dL — ABNORMAL LOW (ref 6.5–8.1)

## 2023-08-02 LAB — GLUCOSE, CAPILLARY
Glucose-Capillary: 151 mg/dL — ABNORMAL HIGH (ref 70–99)
Glucose-Capillary: 193 mg/dL — ABNORMAL HIGH (ref 70–99)
Glucose-Capillary: 131 mg/dL — ABNORMAL HIGH (ref 70–99)
Glucose-Capillary: 141 mg/dL — ABNORMAL HIGH (ref 70–99)

## 2023-08-02 LAB — MAGNESIUM: Magnesium: 2.2 mg/dL (ref 1.7–2.4)

## 2023-08-02 NOTE — Progress Notes (Signed)
PROGRESS NOTE    Alicia Vang  ZOX:[id number] DOB: 02/27/1927 DOA: 07/25/2023 PCP: Knox Royalty, MD  Subjective: Patient seen and examined.  No family at bedside.  Reportedly family is appealing patient's discharge decision.  Patient has received maximum hospital benefit.  she is on room air.  Not receiving any IV medications.  Medically stable for discharge.  No change since yesterday.   Hospital Course: HPI: This is a 89 year old female who lives at home with her daughter.  Patient has been bedbound since hip surgery approximately a year ago.  Her past medical history significant for HTN, Parkinson's disease.  Approximately a week ago her daughter noted patient began breathing heavier.  The PCP was contacted, noted directed to monitor patient's oxygen.  Patient's oxygen has been good approximate 95% and average on room air.  Yesterday it was noted patient appeared to be gasping for breath while talking.  SBP check was over 200.  Last send patient had a rough time with breathing, eventually stating she did not feel right.  Appetite has been low, there is no reports of fevers or chills.  Patient does not complain of chest pains.  Daughter repeats swelling on the right lower extremity.  She additionally states the patient occasionally chokes when she is not on food or drinks water, this has been slightly worse.  The patient was states to the ER.   In the ER patient hemodynamically stable.  Potassium 3.1, serum glucose 262, BNP >4,500. COVID-negative. CXR moderate B/L fluid effusion.  Patient mildly hypoxic satting 90-91%.  She has been placed on 2 L oxygen, satting 100%.    Significant Events: Admitted 07/25/2023 for new CHF   Significant Labs: Admitting BNP >4500  Significant Imaging Studies: 07-25-2023 CXR shows moderate bilateral pleural effusions  Antibiotic Therapy: Anti-infectives (From admission, onward)    None       Procedures: 07-26-2023 left side thoracentesis for  400 ml  Consultants: Cardiology Palliative care    Assessment and Plan: * Acute systolic heart failure (HCC) Admitted for acute CHF. Echo shows LVEF of 25 to 30%.  Pt with parkinson's disease. Mentation is quite poor. Possibly dementia-related to parkinson's. Medically stable for discharge. Reported to me that family is appealing DC decision.  Pt on RA. Lying supine. No respiratory distress. Pt has received maximum inpatient hospital benefit. Repeat CMP, Mg in AM. Pt is not receiving any IV medications. Pt is medically stable for discharge.  Cardiology recommended medical management. They will not pursue any further workup.  08-02-2023 BUN/Scr stable (12/0.9)  Dilated cardiomyopathy (HCC) Stable.  Euvolemic.  Chronic kidney disease, stage 3a (HCC) Baseline creatinine approximately 1.0.  Does not have a primary care provider within the [facility] health system.  No records available from PCP.  Primary hypertension Patient remains on Aldactone 12.5 mg daily, Toprol-XL 50 mg daily, Cozaar 100 mg daily, Lasix 40 mg daily.   BUN/Scr 12/0.9 stable. On 08-02-2023.  Parkinson's disease (HCC) Chronic.  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  Type 2 diabetes mellitus without complication, without long-term current use of insulin (HCC) Remains on sliding scale insulin.  Admission A1c is 6.9%. Stable.  Acute hypoxic respiratory failure (HCC) Weaned to RA after thoracentesis and diuresis. Resolved.  Pressure injury of skin Noted by RN on 07-26-2023 on admission assessment.  Seen by wound care 08-02-2023 upper sacrum with Stage 2 pressure injury, 1X.5X.1cm, pink and dry, lower sacrum with dark red-brown Deep tissue pressure injury, 3X1cm.   Protein-calorie malnutrition, severe See by  RD on 07-27-2023.  Severe Malnutrition related to chronic illness (CHF, Parkinson's) as evidenced by severe fat depletion, severe muscle depletion    Pleural effusion on left S/p thoracentesis on 07-26-2023  for 400 ml   DVT prophylaxis: enoxaparin (LOVENOX) injection 30 mg Start: 07/30/23 1100    Code Status: Full Code Family Communication: no family at bedside Disposition Plan: SNF vs home Reason for continuing need for hospitalization: medically stable for DC. Family is appealing DC and SNF denial.  Objective: Vitals:   08/01/23 1929 08/02/23 0032 08/02/23 0432 08/02/23 0821  BP: (!) 141/60 119/74 132/73 (!) 138/110  Pulse: 77 61 (!) 50 98  Resp: 18 18 20  (!) 21  Temp: 98.4 F (36.9 C) 98.1 F (36.7 C) 97.9 F (36.6 C) 97.7 F (36.5 C)  TempSrc: Oral Oral Oral Oral  SpO2: 98% 94% 97% 98%  Weight:   40 kg   Height:        Intake/Output Summary (Last 24 hours) at 08/02/2023 0959 Last data filed at 08/02/2023 0433 Gross per 24 hour  Intake --  Output 550 ml  Net -550 ml   Filed Weights   07/31/23 0500 08/01/23 0405 08/02/23 0432  Weight: 40.4 kg 36.4 kg 40 kg    Examination:  Physical Exam Vitals and nursing note reviewed.  Constitutional:      Comments: Frail and elderly appearing female. Lying supine in bed. No respiratory distress. On RA.  HENT:     Head: Normocephalic and atraumatic.  Cardiovascular:     Rate and Rhythm: Normal rate and regular rhythm.  Pulmonary:     Effort: Pulmonary effort is normal.  Abdominal:     General: Bowel sounds are normal. There is no distension.     Palpations: Abdomen is soft.  Musculoskeletal:     Right lower leg: No edema.     Left lower leg: No edema.  Skin:    Capillary Refill: Capillary refill takes less than 2 seconds.  Neurological:     Comments: Spoke very softly this AM. Responds to "good morning" with a reply of "good morning"    Data Reviewed: I have personally reviewed following labs and imaging studies  Basic Metabolic Panel: Recent Labs  Lab 07/28/23 0323 07/29/23 0319 07/30/23 0359 07/31/23 0335 08/02/23 0329  NA 141 139 137 136 135  K 3.3* 4.3 3.8 3.5 3.9  CL 100 98 96* 96* 96*  CO2 32 29 28 29  29   GLUCOSE 66* 106* 112* 88 168*  BUN 13 13 15 17 12   CREATININE 0.87 1.01* 0.99 0.93 0.95  CALCIUM 8.8* 8.8* 8.6* 8.4* 8.8*  MG 2.1 2.1 2.2 2.2 2.2   GFR: Estimated Creatinine Clearance: 23.4 mL/min (by C-G formula based on SCr of 0.95 mg/dL). Liver Function Tests: Recent Labs  Lab 08/02/23 0329  AST 23  ALT 23  ALKPHOS 53  BILITOT 1.1  PROT 6.4*  ALBUMIN 3.2*   CBG: Recent Labs  Lab 08/01/23 0623 08/01/23 1110 08/01/23 1507 08/01/23 2054 08/02/23 0554  GLUCAP 108* 121* 220* 139* 151*   Recent Results (from the past 240 hour(s))  Resp panel by RT-PCR (RSV, Flu A&B, Covid) Anterior Nasal Swab     Status: None   Collection Time: 07/25/23  4:21 PM   Specimen: Anterior Nasal Swab  Result Value Ref Range Status   SARS Coronavirus 2 by RT PCR NEGATIVE NEGATIVE Final   Influenza A by PCR NEGATIVE NEGATIVE Final   Influenza B by PCR NEGATIVE NEGATIVE  Final    Comment: (NOTE) The Xpert Xpress SARS-CoV-2/FLU/RSV plus assay is intended as an aid in the diagnosis of influenza from Nasopharyngeal swab specimens and should not be used as a sole basis for treatment. Nasal washings and aspirates are unacceptable for Xpert Xpress SARS-CoV-2/FLU/RSV testing.  Fact Sheet for Patients: BloggerCourse.com  Fact Sheet for Healthcare Providers: SeriousBroker.it  This test is not yet approved or cleared by the [place] FDA and has been authorized for detection and/or diagnosis of SARS-CoV-2 by FDA under an Emergency Use Authorization (EUA). This EUA will remain in effect (meaning this test can be used) for the duration of the COVID-19 declaration under Section 564(b)(1) of the Act, 21 U.S.C. section 360bbb-3(b)(1), unless the authorization is terminated or revoked.     Resp Syncytial Virus by PCR NEGATIVE NEGATIVE Final    Comment: (NOTE) Fact Sheet for Patients: BloggerCourse.com  Fact Sheet for  Healthcare Providers: SeriousBroker.it  This test is not yet approved or cleared by the [place] FDA and has been authorized for detection and/or diagnosis of SARS-CoV-2 by FDA under an Emergency Use Authorization (EUA). This EUA will remain in effect (meaning this test can be used) for the duration of the COVID-19 declaration under Section 564(b)(1) of the Act, 21 U.S.C. section 360bbb-3(b)(1), unless the authorization is terminated or revoked.  Performed at [facility], [address], [place], [place] [address]      Radiology Studies: No results found.  Scheduled Meds:  brinzolamide  1 drop Both Eyes QHS   enoxaparin (LOVENOX) injection  30 mg Subcutaneous Q24H   feeding supplement  1 Container Oral TID BM   furosemide  40 mg Oral Daily   influenza vaccine adjuvanted  0.5 mL Intramuscular Tomorrow-1000   insulin aspart  0-15 Units Subcutaneous TID AC & HS   latanoprost  1 drop Both Eyes QHS   losartan  100 mg Oral Daily   metoprolol succinate  50 mg Oral Daily   multivitamin with minerals  1 tablet Oral Daily   sodium chloride flush  3 mL Intravenous Q12H   spironolactone  12.5 mg Oral Daily   Vitamin D (Ergocalciferol)  50,000 Units Oral Weekly   Continuous Infusions:   LOS: 8 days   Time spent: 30 minutes  Carollee Herter, DO  [facility]  08/02/2023, 9:59 AM

## 2023-08-02 NOTE — TOC Progression Note (Signed)
Transition of Care [facility]) - Progression Note    Patient Details  Name: Alicia Vang MRN: [id number] Date of Birth: [birth year]  [facility] CM/SW Contact  Dellie Burns [place], [place] Phone Number: 08/02/2023, 2:59 PM  Clinical Narrative:   per Reece Levy in [facility] [phone], pt's appeal for SNF is under review. Updated PT/OT notes faxed to Deb for review [phone]. Will provide updates available.   Dellie Burns, MSW, LCSW [phone] (coverage)      Expected Discharge Plan: Skilled Nursing Facility Barriers to Discharge: Continued Medical Work up  Expected Discharge Plan and Services In-house Referral: Clinical Social Work Discharge Planning Services: CM Consult Post Acute Care Choice: NA Living arrangements for the past 2 months: Single Family Home Expected Discharge Date: 07/31/23               DME Arranged: N/A DME Agency: NA       HH Arranged: NA           Social Determinants of Health (SDOH) Interventions SDOH Screenings   Food Insecurity: No Food Insecurity (07/26/2023)  Housing: Low Risk  (07/26/2023)  Transportation Needs: No Transportation Needs (07/26/2023)  Utilities: Not At Risk (07/26/2023)  Tobacco Use: Low Risk  (07/25/2023)    Readmission Risk Interventions     No data to display

## 2023-08-02 NOTE — Consult Note (Addendum)
WOC Nurse re-consult Note: Consult requested to reassess sacrum. This was already performed on 10/4 and topical treatment orders have been provided for bedside nurses to perform. Assessed sacrum and it is basically unchanged; upper sacrum with Stage 2 pressure injury, 1X.5X.1cm, pink and dry, lower sacrum with dark red-brown Deep tissue pressure injury, 3X1cm.  Pt is very emaciated and incontinent with multiple systemic factors that can impair healing.  Pressure Injury POA: Yes Continue present plan of care with xeroform. Please re-consult if further assistance is needed.  Thank-you,  Cammie Mcgee MSN, RN, CWOCN, [place], CNS [phone]

## 2023-08-03 DIAGNOSIS — N1831 Chronic kidney disease, stage 3a: Secondary | ICD-10-CM | POA: Diagnosis not present

## 2023-08-03 DIAGNOSIS — I5021 Acute systolic (congestive) heart failure: Secondary | ICD-10-CM | POA: Diagnosis not present

## 2023-08-03 DIAGNOSIS — I420 Dilated cardiomyopathy: Secondary | ICD-10-CM | POA: Diagnosis not present

## 2023-08-03 DIAGNOSIS — G20A1 Parkinson's disease without dyskinesia, without mention of fluctuations: Secondary | ICD-10-CM | POA: Diagnosis not present

## 2023-08-03 LAB — GLUCOSE, CAPILLARY
Glucose-Capillary: 129 mg/dL — ABNORMAL HIGH (ref 70–99)
Glucose-Capillary: 163 mg/dL — ABNORMAL HIGH (ref 70–99)
Glucose-Capillary: 178 mg/dL — ABNORMAL HIGH (ref 70–99)
Glucose-Capillary: 202 mg/dL — ABNORMAL HIGH (ref 70–99)

## 2023-08-03 NOTE — TOC Progression Note (Addendum)
Transition of Care [facility]) - Progression Note    Patient Details  Name: Alicia Vang MRN: [id number] Date of Birth: 1927/08/05  Transition of Care [facility]) CM/SW Contact  Dellie Burns [place], [place] Phone Number: 08/03/2023, 1:18 PM  Clinical Narrative: Alicia Vang no longer able to offer a bed. Updated pt's dtr and provided current SNF offers. Dtr to update SW with new SNF choice. [employer] appeal for SNF remains pending at this time. Will need to update [employer] CM once new SNF choice known.   [facility] RNCM [employer] Appeals: phone/[phone], fax/[phone]  Dellie Burns, MSW, LCSW [phone] (coverage)      Expected Discharge Plan: Skilled Nursing Facility Barriers to Discharge: Continued Medical Work up  Expected Discharge Plan and Services In-house Referral: Clinical Social Work Discharge Planning Services: CM Consult Post Acute Care Choice: NA Living arrangements for the past 2 months: Single Family Home Expected Discharge Date: 07/31/23               DME Arranged: N/A DME Agency: NA       HH Arranged: NA           Social Determinants of Health (SDOH) Interventions SDOH Screenings   Food Insecurity: No Food Insecurity (07/26/2023)  Housing: Low Risk  (07/26/2023)  Transportation Needs: No Transportation Needs (07/26/2023)  Utilities: Not At Risk (07/26/2023)  Tobacco Use: Low Risk  (07/25/2023)    Readmission Risk Interventions     No data to display

## 2023-08-03 NOTE — Plan of Care (Signed)

## 2023-08-03 NOTE — Progress Notes (Signed)
PROGRESS NOTE    Alicia Vang  NWG:[id number] DOB: 1927-10-15 DOA: 07/25/2023 PCP: Knox Royalty, MD  Subjective: Patient seen and examined.  No family at bedside.  Reportedly family is appealing patient's discharge decision.  Patient has received maximum hospital benefit.  she is on room air.  Not receiving any IV medications.  Medically stable for discharge.  No change since yesterday. Still waiting to hear from appeal.   Hospital Course: HPI: This is a 89 year old female who lives at home with her daughter.  Patient has been bedbound since hip surgery approximately a year ago.  Her past medical history significant for HTN, Parkinson's disease.  Approximately a week ago her daughter noted patient began breathing heavier.  The PCP was contacted, noted directed to monitor patient's oxygen.  Patient's oxygen has been good approximate 95% and average on room air.  Yesterday it was noted patient appeared to be gasping for breath while talking.  SBP check was over 200.  Last send patient had a rough time with breathing, eventually stating she did not feel right.  Appetite has been low, there is no reports of fevers or chills.  Patient does not complain of chest pains.  Daughter repeats swelling on the right lower extremity.  She additionally states the patient occasionally chokes when she is not on food or drinks water, this has been slightly worse.  The patient was states to the ER.   In the ER patient hemodynamically stable.  Potassium 3.1, serum glucose 262, BNP >4,500. COVID-negative. CXR moderate B/L fluid effusion.  Patient mildly hypoxic satting 90-91%.  She has been placed on 2 L oxygen, satting 100%.    Significant Events: Admitted 07/25/2023 for new CHF   Significant Labs: Admitting BNP >4500  Significant Imaging Studies: 07-25-2023 CXR shows moderate bilateral pleural effusions  Antibiotic Therapy: Anti-infectives (From admission, onward)    None        Procedures: 07-26-2023 left side thoracentesis for 400 ml  Consultants: Cardiology Palliative care    Assessment and Plan: * Acute systolic heart failure (HCC) Admitted for acute CHF. Echo shows LVEF of 25 to 30%.  Pt with parkinson's disease. Mentation is quite poor. Possibly dementia-related to parkinson's. Medically stable for discharge. Reported to me that family is appealing DC decision.  Pt on RA. Lying supine. No respiratory distress. Pt has received maximum inpatient hospital benefit. Repeat CMP, Mg in AM. Pt is not receiving any IV medications. Pt is medically stable for discharge.  Cardiology recommended medical management. They will not pursue any further workup.  08-02-2023 BUN/Scr stable (12/0.9)  Dilated cardiomyopathy (HCC) Stable.  Euvolemic.  Chronic kidney disease, stage 3a (HCC) Baseline creatinine approximately 1.0.  Does not have a primary care provider within the [facility] health [facility].  No records available from PCP.  Primary hypertension Patient remains on Aldactone 12.5 mg daily, Toprol-XL 50 mg daily, Cozaar 100 mg daily, Lasix 40 mg daily.   BUN/Scr 12/0.9 stable. On 08-02-2023.  Parkinson's disease (HCC) Chronic.  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  Type 2 diabetes mellitus without complication, without long-term current use of insulin (HCC) Remains on sliding scale insulin.  Admission A1c is 6.9%. Stable.  Acute hypoxic respiratory failure (HCC) Weaned to RA after thoracentesis and diuresis. Resolved.  Pressure injury of skin Noted by RN on 07-26-2023 on admission assessment.  Seen by wound care 08-02-2023 upper sacrum with Stage 2 pressure injury, 1X.5X.1cm, pink and dry, lower sacrum with dark red-brown Deep tissue pressure injury, 3X1cm.  Protein-calorie malnutrition, severe See by RD on 07-27-2023.  Severe Malnutrition related to chronic illness (CHF, Parkinson's) as evidenced by severe fat depletion, severe muscle depletion     Pleural effusion on left S/p thoracentesis on 07-26-2023 for 400 ml       DVT prophylaxis: enoxaparin (LOVENOX) injection 30 mg Start: 07/30/23 1100    Code Status: Full Code Family Communication: no family at bedside. Have not seen any family members in 3 days Disposition Plan: home vs SNF Reason for continuing need for hospitalization: medically stable for DC.  Objective: Vitals:   08/02/23 1929 08/03/23 0109 08/03/23 0510 08/03/23 1001  BP: 124/63 97/62 (!) 130/55 (!) 150/50  Pulse: (!) 141  (!) 47 74  Resp: 17  18   Temp: 97.9 F (36.6 C) 97.7 F (36.5 C) 97.8 F (36.6 C)   TempSrc: Oral Oral Oral   SpO2: 99%  98%   Weight:   39.3 kg   Height:        Intake/Output Summary (Last 24 hours) at 08/03/2023 1101 Last data filed at 08/03/2023 0636 Gross per 24 hour  Intake --  Output 400 ml  Net -400 ml   Filed Weights   08/01/23 0405 08/02/23 0432 08/03/23 0510  Weight: 36.4 kg 40 kg 39.3 kg    Examination:  Physical Exam Vitals and nursing note reviewed.  Constitutional:      General: She is not in acute distress.    Appearance: She is not toxic-appearing or diaphoretic.     Comments: Frail and elderly appearing female. Left lateral recumbent in bed. Curled up in fetal position.  No respiratory distress. On RA.  HENT:     Head: Normocephalic and atraumatic.  Cardiovascular:     Rate and Rhythm: Normal rate and regular rhythm.  Pulmonary:     Effort: Pulmonary effort is normal.  Abdominal:     General: Bowel sounds are normal. There is no distension.     Palpations: Abdomen is soft.  Musculoskeletal:     Right lower leg: No edema.     Left lower leg: No edema.  Skin:    Capillary Refill: Capillary refill takes less than 2 seconds.  Neurological:     Comments: Sleeping this AM.     Data Reviewed: I have personally reviewed following labs and imaging studies  Basic Metabolic Panel: Recent Labs  Lab 07/28/23 0323 07/29/23 0319 07/30/23 0359  07/31/23 0335 08/02/23 0329  NA 141 139 137 136 135  K 3.3* 4.3 3.8 3.5 3.9  CL 100 98 96* 96* 96*  CO2 32 29 28 29 29   GLUCOSE 66* 106* 112* 88 168*  BUN 13 13 15 17 12   CREATININE 0.87 1.01* 0.99 0.93 0.95  CALCIUM 8.8* 8.8* 8.6* 8.4* 8.8*  MG 2.1 2.1 2.2 2.2 2.2   GFR: Estimated Creatinine Clearance: 23 mL/min (by C-G formula based on SCr of 0.95 mg/dL). Liver Function Tests: Recent Labs  Lab 08/02/23 0329  AST 23  ALT 23  ALKPHOS 53  BILITOT 1.1  PROT 6.4*  ALBUMIN 3.2*   CBG: Recent Labs  Lab 08/02/23 0554 08/02/23 1101 08/02/23 1522 08/02/23 2117 08/03/23 0610  GLUCAP 151* 193* 131* 141* 129*    Recent Results (from the past 240 hour(s))  Resp panel by RT-PCR (RSV, Flu A&B, Covid) Anterior Nasal Swab     Status: None   Collection Time: 07/25/23  4:21 PM   Specimen: Anterior Nasal Swab  Result Value Ref Range Status  SARS Coronavirus 2 by RT PCR NEGATIVE NEGATIVE Final   Influenza A by PCR NEGATIVE NEGATIVE Final   Influenza B by PCR NEGATIVE NEGATIVE Final    Comment: (NOTE) The Xpert Xpress SARS-CoV-2/FLU/RSV plus assay is intended as an aid in the diagnosis of influenza from Nasopharyngeal swab specimens and should not be used as a sole basis for treatment. Nasal washings and aspirates are unacceptable for Xpert Xpress SARS-CoV-2/FLU/RSV testing.  Fact Sheet for Patients: BloggerCourse.com  Fact Sheet for Healthcare Providers: SeriousBroker.it  This test is not yet approved or cleared by the [place] FDA and has been authorized for detection and/or diagnosis of SARS-CoV-2 by FDA under an Emergency Use Authorization (EUA). This EUA will remain in effect (meaning this test can be used) for the duration of the COVID-19 declaration under Section 564(b)(1) of the Act, 21 U.S.C. section 360bbb-3(b)(1), unless the authorization is terminated or revoked.     Resp Syncytial Virus by PCR NEGATIVE  NEGATIVE Final    Comment: (NOTE) Fact Sheet for Patients: BloggerCourse.com  Fact Sheet for Healthcare Providers: SeriousBroker.it  This test is not yet approved or cleared by the [place] FDA and has been authorized for detection and/or diagnosis of SARS-CoV-2 by FDA under an Emergency Use Authorization (EUA). This EUA will remain in effect (meaning this test can be used) for the duration of the COVID-19 declaration under Section 564(b)(1) of the Act, 21 U.S.C. section 360bbb-3(b)(1), unless the authorization is terminated or revoked.  Performed at [facility], [address], [place], [place] [address]      Radiology Studies: No results found.  Scheduled Meds:  brinzolamide  1 drop Both Eyes QHS   enoxaparin (LOVENOX) injection  30 mg Subcutaneous Q24H   feeding supplement  1 Container Oral TID BM   furosemide  40 mg Oral Daily   influenza vaccine adjuvanted  0.5 mL Intramuscular Tomorrow-1000   insulin aspart  0-15 Units Subcutaneous TID AC & HS   latanoprost  1 drop Both Eyes QHS   losartan  100 mg Oral Daily   metoprolol succinate  50 mg Oral Daily   multivitamin with minerals  1 tablet Oral Daily   sodium chloride flush  3 mL Intravenous Q12H   spironolactone  12.5 mg Oral Daily   Vitamin D (Ergocalciferol)  50,000 Units Oral Weekly   Continuous Infusions:   LOS: 9 days   Time spent: 30 minutes  Carollee Herter, DO  [facility]  08/03/2023, 11:01 AM

## 2023-08-03 NOTE — Progress Notes (Signed)
Physical Therapy Treatment Patient Details Name: Alicia Vang MRN: [id number] DOB: 09/03/1927 Today's Date: 08/03/2023   History of Present Illness 89 yo female presents to [facility] on 10/2 with dyspnea, RLE swelling with workup for HF. CXR shows bilat moderate pleural effusions. S/p L thoracentesis on 10/3. PMH includes OA, L THA 2019, DMII, HTN, hypokalemia, parkinsons.    PT Comments  Pt resting in R sidelying upon PT arrival to room, states she feels "fine" when asked with no indication of pain. Pt more engaged in bed mobility tasks this date, able to assist with rolling bilat. Pt requiring max-total assist to move to/from EOB but once on EOB pt tolerates sitting without PT support. Pt with limited tolerance due to fatigue, and resists attempts to rise to standing. Plan remains appropriate.       If plan is discharge home, recommend the following: Direct supervision/assist for medications management;Assistance with cooking/housework;Assistance with feeding;Assist for transportation;Help with stairs or ramp for entrance;A lot of help with walking and/or transfers;A lot of help with bathing/dressing/bathroom   Can travel by private vehicle     No  Equipment Recommendations  None recommended by PT    Recommendations for Other Services       Precautions / Restrictions Precautions Precautions: Fall Restrictions Weight Bearing Restrictions: No     Mobility  Bed Mobility Overal bed mobility: Needs Assistance Bed Mobility: Supine to Sit, Sit to Supine, Rolling Rolling: Mod assist   Supine to sit: Max assist Sit to supine: Max assist   General bed mobility comments: pt reaches for opposite bedrail for rolling tasks bilat; assist for trunk and LE management coming to/from EOB.    Transfers Overall transfer level: Needs assistance                 General transfer comment: unable - pt resisting PT assist to come into stand    Ambulation/Gait                    Stairs             Wheelchair Mobility     Tilt Bed    Modified Rankin (Stroke Patients Only)       Balance Overall balance assessment: Needs assistance Sitting-balance support: Bilateral upper extremity supported, Feet supported Sitting balance-[name] Scale: Fair Sitting balance - Comments: sits EOB with supervision x10 minutes, leans posteriorly with LE ROM exericse                                    Cognition Arousal: Alert Behavior During Therapy: Flat affect Overall Cognitive Status: Difficult to assess                                 General Comments: HOH, limited verbalization mostly yes/no, Poor initiation and sequencing        Exercises General Exercises - Lower Extremity Long Arc Quad: AAROM, Both, 15 reps, Seated    General Comments General comments (skin integrity, edema, etc.): vss e      Pertinent Vitals/Pain Pain Assessment Pain Assessment: Faces Faces Pain Scale: Hurts a little bit Pain Location: generalized Pain Descriptors / Indicators: Guarding Pain Intervention(s): Monitored during session, Repositioned, Limited activity within patient's tolerance    Home Living  Prior Function            PT Goals (current goals can now be found in the care plan section) Acute Rehab PT Goals Patient Stated Goal: return to PLOF, which is transfer-level +1 PT Goal Formulation: With patient/family Time For Goal Achievement: 08/10/23 Potential to Achieve Goals: Fair Progress towards PT goals: Progressing toward goals    Frequency    Min 1X/week      PT Plan      Co-evaluation              AM-PAC PT "6 Clicks" Mobility   Outcome Measure  Help needed turning from your back to your side while in a flat bed without using bedrails?: A Lot Help needed moving from lying on your back to sitting on the side of a flat bed without using bedrails?: A Lot Help needed moving  to and from a bed to a chair (including a wheelchair)?: Total Help needed standing up from a chair using your arms (e.g., wheelchair or bedside chair)?: Total Help needed to walk in hospital room?: Total Help needed climbing 3-5 steps with a railing? : Total 6 Click Score: 8    End of Session   Activity Tolerance: Patient tolerated treatment well;Patient limited by fatigue Patient left: in bed;with call bell/phone within reach;with bed alarm set;Other (comment) (in R sidelying propped with pillows between knees and along back, for pressure relief) Nurse Communication: Mobility status PT Visit Diagnosis: Other abnormalities of gait and mobility (R26.89);Muscle weakness (generalized) (M62.81)     Time: 1610-9604 PT Time Calculation (min) (ACUTE ONLY): 16 min  Charges:    $Therapeutic Activity: 8-22 mins PT General Charges $$ ACUTE PT VISIT: 1 Visit                     Marye Round, PT DPT Acute Rehabilitation Services Secure Chat Preferred  Office [phone]    Alicia Vang 08/03/2023, 2:56 PM

## 2023-08-04 DIAGNOSIS — I5021 Acute systolic (congestive) heart failure: Secondary | ICD-10-CM | POA: Diagnosis not present

## 2023-08-04 DIAGNOSIS — G20A1 Parkinson's disease without dyskinesia, without mention of fluctuations: Secondary | ICD-10-CM | POA: Diagnosis not present

## 2023-08-04 DIAGNOSIS — N1831 Chronic kidney disease, stage 3a: Secondary | ICD-10-CM | POA: Diagnosis not present

## 2023-08-04 DIAGNOSIS — I420 Dilated cardiomyopathy: Secondary | ICD-10-CM | POA: Diagnosis not present

## 2023-08-04 LAB — CBC WITH DIFFERENTIAL/PLATELET
Abs Immature Granulocytes: 0.02 10*3/uL (ref 0.00–0.07)
Basophils Absolute: 0 10*3/uL (ref 0.0–0.1)
Basophils Relative: 0 %
Eosinophils Absolute: 0 10*3/uL (ref 0.0–0.5)
Eosinophils Relative: 1 %
HCT: 35.6 % — ABNORMAL LOW (ref 36.0–46.0)
Hemoglobin: 11.5 g/dL — ABNORMAL LOW (ref 12.0–15.0)
Immature Granulocytes: 1 %
Lymphocytes Relative: 12 %
Lymphs Abs: 0.5 10*3/uL — ABNORMAL LOW (ref 0.7–4.0)
MCH: 29.5 pg (ref 26.0–34.0)
MCHC: 32.3 g/dL (ref 30.0–36.0)
MCV: 91.3 fL (ref 80.0–100.0)
Monocytes Absolute: 0.3 10*3/uL (ref 0.1–1.0)
Monocytes Relative: 7 %
Neutro Abs: 3.5 10*3/uL (ref 1.7–7.7)
Neutrophils Relative %: 79 %
Platelets: 172 10*3/uL (ref 150–400)
RBC: 3.9 MIL/uL (ref 3.87–5.11)
RDW: 15.7 % — ABNORMAL HIGH (ref 11.5–15.5)
WBC: 4.4 10*3/uL (ref 4.0–10.5)
nRBC: 0 % (ref 0.0–0.2)

## 2023-08-04 LAB — GLUCOSE, CAPILLARY
Glucose-Capillary: 144 mg/dL — ABNORMAL HIGH (ref 70–99)
Glucose-Capillary: 239 mg/dL — ABNORMAL HIGH (ref 70–99)
Glucose-Capillary: 146 mg/dL — ABNORMAL HIGH (ref 70–99)
Glucose-Capillary: 146 mg/dL — ABNORMAL HIGH (ref 70–99)

## 2023-08-04 LAB — COMPREHENSIVE METABOLIC PANEL
ALT: 22 U/L (ref 0–44)
AST: 21 U/L (ref 15–41)
Albumin: 3.2 g/dL — ABNORMAL LOW (ref 3.5–5.0)
Alkaline Phosphatase: 52 U/L (ref 38–126)
Anion gap: 13 (ref 5–15)
BUN: 18 mg/dL (ref 8–23)
CO2: 27 mmol/L (ref 22–32)
Calcium: 9.1 mg/dL (ref 8.9–10.3)
Chloride: 97 mmol/L — ABNORMAL LOW (ref 98–111)
Creatinine, Ser: 1.26 mg/dL — ABNORMAL HIGH (ref 0.44–1.00)
GFR, Estimated: 40 mL/min — ABNORMAL LOW (ref >60)
Glucose, Bld: 164 mg/dL — ABNORMAL HIGH (ref 70–99)
Potassium: 3.6 mmol/L (ref 3.5–5.1)
Sodium: 137 mmol/L (ref 135–145)
Total Bilirubin: 0.9 mg/dL (ref 0.3–1.2)
Total Protein: 6.8 g/dL (ref 6.5–8.1)

## 2023-08-04 LAB — MAGNESIUM: Magnesium: 2.3 mg/dL (ref 1.7–2.4)

## 2023-08-04 MED ORDER — FUROSEMIDE 20 MG PO TABS: 20.0000 mg | ORAL_TABLET | Freq: Every day | ORAL | Status: DC

## 2023-08-04 NOTE — Plan of Care (Signed)

## 2023-08-04 NOTE — Progress Notes (Signed)
PROGRESS NOTE    Alicia Vang  JOA:[id number] DOB: 1927-10-08 DOA: 07/25/2023 PCP: Knox Royalty, MD  Subjective: Patient seen and examined.  No family at bedside.  Reportedly family is appealing patient's discharge decision.  Patient has received maximum hospital benefit.  she is on room air.  Not receiving any IV medications.  Medically stable for discharge.  No change since yesterday. Still waiting to hear from appeal.  Slight increase in Scr to 1.26 today. Last Scr from 08-02-2023 of 0.95. BUN is normal. Will stop aldactone and ARB.   Hospital Course: HPI: This is a 89 year old female who lives at home with her daughter.  Patient has been bedbound since hip surgery approximately a year ago.  Her past medical history significant for HTN, Parkinson's disease.  Approximately a week ago her daughter noted patient began breathing heavier.  The PCP was contacted, noted directed to monitor patient's oxygen.  Patient's oxygen has been good approximate 95% and average on room air.  Yesterday it was noted patient appeared to be gasping for breath while talking.  SBP check was over 200.  Last send patient had a rough time with breathing, eventually stating she did not feel right.  Appetite has been low, there is no reports of fevers or chills.  Patient does not complain of chest pains.  Daughter repeats swelling on the right lower extremity.  She additionally states the patient occasionally chokes when she is not on food or drinks water, this has been slightly worse.  The patient was states to the ER.   In the ER patient hemodynamically stable.  Potassium 3.1, serum glucose 262, BNP >4,500. COVID-negative. CXR moderate B/L fluid effusion.  Patient mildly hypoxic satting 90-91%.  She has been placed on 2 L oxygen, satting 100%.    Significant Events: Admitted 07/25/2023 for new CHF   Significant Labs: Admitting BNP >4500  Significant Imaging Studies: 07-25-2023 CXR shows moderate  bilateral pleural effusions  Antibiotic Therapy: Anti-infectives (From admission, onward)    None       Procedures: 07-26-2023 left side thoracentesis for 400 ml  Consultants: Cardiology Palliative care    Assessment and Plan: * Acute systolic heart failure (HCC) Admitted for acute CHF. Echo shows LVEF of 25 to 30%.  Pt with parkinson's disease. Mentation is quite poor. Possibly dementia-related to parkinson's. Medically stable for discharge. Reported to me that family is appealing DC decision.  Pt on RA. Lying supine. No respiratory distress. Pt has received maximum inpatient hospital benefit. Repeat CMP, Mg in AM. Pt is not receiving any IV medications. Pt is medically stable for discharge.  Cardiology recommended medical management. They will not pursue any further workup.  08-02-2023 BUN/Scr stable (12/0.9)  Dilated cardiomyopathy (HCC) Stable.  Euvolemic.  There is a question of whether pt has infiltrative cardiomyopathy(I.e. amyloid). Cardiology has reviewed case and decided that given pt's poor functional status, multiple co-morbidities, that invasive workup in not in patient's best interest. No further therapy or investigation is warranted.  Given increase in Scr, will stop aldactone and ARB.  Chronic kidney disease, stage 3a (HCC) Baseline creatinine approximately 1.0.  Does not have a primary care provider within the [facility] system.  No records available from PCP.  08-04-2023. Increase in Scr to 1.26. will stop aldactone and ARB due to poor po intake. Repeat CMP in AM.  Primary hypertension Patient remains on Aldactone 12.5 mg daily, Toprol-XL 50 mg daily, Cozaar 100 mg daily, Lasix 40 mg daily.   BUN/Scr 12/0.9  stable. On 08-02-2023.  Parkinson's disease (HCC) Chronic.  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  Family seems in denial of pt's overall poor prognosis. Palliative care was consulted. Family resistant in accepting pt's diagnoses and overall  poor outcome. Pt remains full scope of care and full code. I fully expect pt to do very poorly in the next few weeks to months.  Type 2 diabetes mellitus without complication, without long-term current use of insulin (HCC) Remains on sliding scale insulin.  Admission A1c is 6.9%. Stable.  Acute hypoxic respiratory failure (HCC) Weaned to RA after thoracentesis and diuresis. Resolved.  Pressure injury of skin Noted by RN on 07-26-2023 on admission assessment.  Seen by wound care 08-02-2023 upper sacrum with Stage 2 pressure injury, 1X.5X.1cm, pink and dry, lower sacrum with dark red-brown Deep tissue pressure injury, 3X1cm.   Protein-calorie malnutrition, severe See by RD on 07-27-2023.  Severe Malnutrition related to chronic illness (CHF, Parkinson's) as evidenced by severe fat depletion, severe muscle depletion    Pleural effusion on left S/p thoracentesis on 07-26-2023 for 400 ml   DVT prophylaxis: enoxaparin (LOVENOX) injection 30 mg Start: 07/30/23 1100    Code Status: Full Code Family Communication: no family at bedside. Have not seen any family members at bedside for 4 days Disposition Plan: home vs SNF Reason for continuing need for hospitalization: medically stable for discharge.  Objective: Vitals:   08/03/23 2300 08/03/23 2304 08/04/23 0410 08/04/23 0758  BP:  (!) 108/59 118/66 (!) 151/131  Pulse: 60 (!) 54 64 (!) 54  Resp:  19 (!) 22 (!) 24  Temp:  97.9 F (36.6 C) 98.6 F (37 C) (!) 97.5 F (36.4 C)  TempSrc:  Oral Axillary Oral  SpO2: 96% 96% 94% 95%  Weight:   39.5 kg   Height:        Intake/Output Summary (Last 24 hours) at 08/04/2023 0945 Last data filed at 08/03/2023 1807 Gross per 24 hour  Intake 240 ml  Output 400 ml  Net -160 ml   Filed Weights   08/02/23 0432 08/03/23 0510 08/04/23 0410  Weight: 40 kg 39.3 kg 39.5 kg    Examination:  Physical Exam Vitals and nursing note reviewed.  Constitutional:      General: She is not in acute  distress.    Appearance: She is not toxic-appearing or diaphoretic.     Comments: Frail and elderly appearing female. Lying supine and curled up in fetal position.  No respiratory distress. On RA.  HENT:     Head: Normocephalic and atraumatic.  Cardiovascular:     Rate and Rhythm: Normal rate and regular rhythm.  Pulmonary:     Effort: Pulmonary effort is normal.  Abdominal:     General: Bowel sounds are normal. There is no distension.     Palpations: Abdomen is soft.  Musculoskeletal:     Right lower leg: No edema.     Left lower leg: No edema.  Skin:    Capillary Refill: Capillary refill takes less than 2 seconds.  Neurological:     Comments: Sleeping this AM.     Data Reviewed: I have personally reviewed following labs and imaging studies  CBC: Recent Labs  Lab 08/04/23 0350  WBC 4.4  NEUTROABS 3.5  HGB 11.5*  HCT 35.6*  MCV 91.3  PLT 172   Basic Metabolic Panel: Recent Labs  Lab 07/29/23 0319 07/30/23 0359 07/31/23 0335 08/02/23 0329 08/04/23 0350  NA 139 137 136 135 137  K 4.3  3.8 3.5 3.9 3.6  CL 98 96* 96* 96* 97*  CO2 29 28 29 29 27   GLUCOSE 106* 112* 88 168* 164*  BUN 13 15 17 12 18   CREATININE 1.01* 0.99 0.93 0.95 1.26*  CALCIUM 8.8* 8.6* 8.4* 8.8* 9.1  MG 2.1 2.2 2.2 2.2 2.3   GFR: Estimated Creatinine Clearance: 17.4 mL/min (A) (by C-G formula based on SCr of 1.26 mg/dL (H)). Liver Function Tests: Recent Labs  Lab 08/02/23 0329 08/04/23 0350  AST 23 21  ALT 23 22  ALKPHOS 53 52  BILITOT 1.1 0.9  PROT 6.4* 6.8  ALBUMIN 3.2* 3.2*   CBG: Recent Labs  Lab 08/03/23 0610 08/03/23 1110 08/03/23 1526 08/03/23 2114 08/04/23 0615  GLUCAP 129* 163* 178* 202* 144*    Recent Results (from the past 240 hour(s))  Resp panel by RT-PCR (RSV, Flu A&B, Covid) Anterior Nasal Swab     Status: None   Collection Time: 07/25/23  4:21 PM   Specimen: Anterior Nasal Swab  Result Value Ref Range Status   SARS Coronavirus 2 by RT PCR NEGATIVE NEGATIVE  Final   Influenza A by PCR NEGATIVE NEGATIVE Final   Influenza B by PCR NEGATIVE NEGATIVE Final    Comment: (NOTE) The Xpert Xpress SARS-CoV-2/FLU/RSV plus assay is intended as an aid in the diagnosis of influenza from Nasopharyngeal swab specimens and should not be used as a sole basis for treatment. Nasal washings and aspirates are unacceptable for Xpert Xpress SARS-CoV-2/FLU/RSV testing.  Fact Sheet for Patients: BloggerCourse.com  Fact Sheet for Healthcare Providers: SeriousBroker.it  This test is not yet approved or cleared by the [place] FDA and has been authorized for detection and/or diagnosis of SARS-CoV-2 by FDA under an Emergency Use Authorization (EUA). This EUA will remain in effect (meaning this test can be used) for the duration of the COVID-19 declaration under Section 564(b)(1) of the Act, 21 U.S.C. section 360bbb-3(b)(1), unless the authorization is terminated or revoked.     Resp Syncytial Virus by PCR NEGATIVE NEGATIVE Final    Comment: (NOTE) Fact Sheet for Patients: BloggerCourse.com  Fact Sheet for Healthcare Providers: SeriousBroker.it  This test is not yet approved or cleared by the [place] FDA and has been authorized for detection and/or diagnosis of SARS-CoV-2 by FDA under an Emergency Use Authorization (EUA). This EUA will remain in effect (meaning this test can be used) for the duration of the COVID-19 declaration under Section 564(b)(1) of the Act, 21 U.S.C. section 360bbb-3(b)(1), unless the authorization is terminated or revoked.  Performed at [facility], [address], [place], [place] [address]      Radiology Studies: No results found.  Scheduled Meds:  brinzolamide  1 drop Both Eyes QHS   enoxaparin (LOVENOX) injection  30 mg Subcutaneous Q24H   feeding supplement  1 Container Oral TID BM   furosemide  40 mg Oral  Daily   insulin aspart  0-15 Units Subcutaneous TID AC & HS   latanoprost  1 drop Both Eyes QHS   metoprolol succinate  50 mg Oral Daily   multivitamin with minerals  1 tablet Oral Daily   sodium chloride flush  3 mL Intravenous Q12H   Vitamin D (Ergocalciferol)  50,000 Units Oral Weekly   Continuous Infusions:   LOS: 10 days   Time spent: 35 minutes  Carollee Herter, DO  [facility]  08/04/2023, 9:45 AM

## 2023-08-04 NOTE — Evaluation (Addendum)
Clinical/Bedside Swallow Evaluation Patient Details  Name: Alicia Vang MRN: [id number] Date of Birth: 10/02/1927  Today's Date: 08/04/2023 Time: SLP Start Time (ACUTE ONLY): 1330 SLP Stop Time (ACUTE ONLY): 1343 SLP Time Calculation (min) (ACUTE ONLY): 13 min  Past Medical History:  Past Medical History:  Diagnosis Date   Arthritis    Diabetes mellitus without complication (HCC)    Type II   Hypertension    Hypokalemia    Past Surgical History:  Past Surgical History:  Procedure Laterality Date   ABDOMINAL HYSTERECTOMY     CHALAZION EXCISION  11/06/2011   Procedure: MINOR EXCISION OF CHALAZION;  Surgeon: Vita Erm.;  Location: [facility];  Service: Ophthalmology;  Laterality: Left;   CHALAZION EXCISION  02/02/2012   Procedure: MINOR EXCISION OF CHALAZION;  Surgeon: Vita Erm., MD;  Location: [facility];  Service: Ophthalmology;  Laterality: Left;  left eye upper lid   CHALAZION EXCISION Right 03/28/2013   Procedure: MINOR EXCISION OF CHALAZION upper and lower right eye ;  Surgeon: Vita Erm., MD;  Location: [facility];  Service: Ophthalmology;  Laterality: Right;   CHALAZION EXCISION Right 03/28/2013   Procedure: MINOR EXCISION OF CHALAZION UPPER AND LOWER RIGHT EYE  ;  Surgeon: Vita Erm., MD;  Location: [facility] OR;  Service: Ophthalmology;  Laterality: Right;   COLONOSCOPY     HIP PINNING,CANNULATED Right 07/02/2022   Procedure: PERCUTANEOUS SCREW FIXATION OF HIP;  Surgeon: Jones Broom, MD;  Location: [facility] OR;  Service: Orthopedics;  Laterality: Right;   IR THORACENTESIS ASP PLEURAL SPACE W/IMG GUIDE  07/26/2023   TOTAL HIP ARTHROPLASTY Left 10/26/2017   Procedure: TOTAL HIP ARTHROPLASTY ANTERIOR APPROACH;  Surgeon: Gean Birchwood, MD;  Location: [facility] OR;  Service: Orthopedics;  Laterality: Left;   HPI:  This is a 89 year old female who lives at home with her daughter.  Patient has been  bedbound since hip surgery approximately a year ago.  Her past medical history significant for HTN, Parkinson's disease.  Pt presented to the ED with SOB and RLE swelling. CXR remarkable for moderate bilateral pleural effusions with bibasilar atelectasis. Pt seen by SLP this admission and recommended thin and puree and discharged. Pt now coughing with thin liquids at times per dtr    Assessment / Plan / Recommendation  Clinical Impression  Pt known to this SLP from this admission where thin liquids and puree recommended. Daughter states pt is coughing with liquids intermittently. She needs additional time to extract liquids from straw at times and holds liquids and purees in oral cavity benefiting from additional time to swallow. Pt did have consistent cough today with water. Discussed options with daughter and all pt drinks at home is water which is less toxic to lungs than other liquids if aspirated. Do not feel that thickening her liquids will be of benefit to pt and daughter agreed. She aslso coughed with sip via cup. She is at higher aspiration risk with Parkinson's but she has not had pneumonia per daughter. Recommend pt consume water, Boost or Ensure (as they are thicker and dtr reports she does not cough with them) only as her liquid in upright position and continue puree texture. Daughter in agreement with plan. Educated pt's daughter on how to use dry spoon to facilitate initiation of swallow and how to look at her neck for when swallow occurs. No further ST is needed. SLP Visit Diagnosis: Dysphagia, unspecified (R13.10)  Aspiration Risk  Moderate aspiration risk    Diet Recommendation Dysphagia 1 (Puree);Thin liquid (drink water)    Liquid Administration via: Straw Medication Administration: Crushed with puree Supervision: Staff to assist with self feeding;Full supervision/cueing for compensatory strategies Compensations: Minimize environmental distractions;Small sips/bites;Slow  rate Postural Changes: Seated upright at 90 degrees    Other  Recommendations Oral Care Recommendations: Oral care BID    Recommendations for follow up therapy are one component of a multi-disciplinary discharge planning process, led by the attending physician.  Recommendations may be updated based on patient status, additional functional criteria and insurance authorization.  Follow up Recommendations No SLP follow up      Assistance Recommended at Discharge    Functional Status Assessment Patient has not had a recent decline in their functional status  Frequency and Duration            Prognosis        Swallow Study   General HPI: This is a 89 year old female who lives at home with her daughter.  Patient has been bedbound since hip surgery approximately a year ago.  Her past medical history significant for HTN, Parkinson's disease.  Pt presented to the ED with SOB and RLE swelling. CXR remarkable for moderate bilateral pleural effusions with bibasilar atelectasis. Pt seen by SLP this admission and recommended thin and puree and discharged. Pt now coughing with thin liquids at times per dtr Type of Study: Bedside Swallow Evaluation Previous Swallow Assessment:  (see HPI) Diet Prior to this Study: Dysphagia 1 (pureed);Thin liquids (Level 0) Temperature Spikes Noted: No Respiratory Status: Room air History of Recent Intubation: No Behavior/Cognition: Alert;Cooperative;Pleasant mood;Requires cueing Oral Cavity Assessment: Within Functional Limits Oral Care Completed by SLP: No Oral Cavity - Dentition: Edentulous Vision: Functional for self-feeding Self-Feeding Abilities: Needs assist Patient Positioning: Upright in bed Baseline Vocal Quality:  (no verbalizations) Volitional Cough: Cognitively unable to elicit Volitional Swallow: Unable to elicit    Oral/Motor/Sensory Function Overall Oral Motor/Sensory Function:  (did not follow commands for oromotor)   Ice Chips Ice chips:  Not tested   Thin Liquid Thin Liquid: Impaired Presentation: Cup;Straw Pharyngeal  Phase Impairments: Cough - Immediate    Nectar Thick Nectar Thick Liquid: Not tested   Honey Thick Honey Thick Liquid: Not tested   Puree Puree: Within functional limits   Solid     Solid: Not tested      Royce Macadamia 08/04/2023,2:04 PM

## 2023-08-04 NOTE — Plan of Care (Signed)
  Problem: Education: Goal: Individualized Educational Video(s) Outcome: Progressing   Problem: Coping: Goal: Ability to adjust to condition or change in health will improve Outcome: Progressing   Problem: Fluid Volume: Goal: Ability to maintain a balanced intake and output will improve Outcome: Progressing   Problem: Metabolic: Goal: Ability to maintain appropriate glucose levels will improve Outcome: Progressing   Problem: Nutritional: Goal: Maintenance of adequate nutrition will improve Outcome: Progressing

## 2023-08-04 NOTE — TOC Progression Note (Signed)
Transition of Care [facility]) - Progression Note    Patient Details  Name: Alicia Vang MRN: [id number] Date of Birth: 01-06-1927  Transition of Care [facility]) CM/SW Contact  Dellie Burns [place], [place] Phone Number: 08/04/2023, 2:20 PM  Clinical Narrative:   call received from [facility] with [facility] who reports pt's request for SNF has been approved #[id number], valid 10/7-10/16. SW explained to [facility] that the facility [facility] listed on the initial request for SNF is no longer able to offer a bed and this SW made the [facility] CM Inetta Fermo [phone]) aware of this on Friday. Fleet Contras suggested SW f/u with Inetta Fermo to make change in facility. Left voicemail for Inetta Fermo requesting return call but do not anticipate a return call until Monday.   Updated pt's dtr and reiterated need for her to choose a different facility. Dtr states she has left a voicemail with [facility] office with additional information and is hopeful they will be able to accept pt. Dtr is aware she will need to choose a different facility Monday if [facility] still declines and states she prefers [facility]. Confirmed bed availability with [facility].   Will f/u [facility] on Monday and with Inetta Fermo at [facility] [phone] if facility needs to be changed.   Dellie Burns, MSW, LCSW [phone] (coverage)       Expected Discharge Plan: Skilled Nursing Facility Barriers to Discharge: Continued Medical Work up  Expected Discharge Plan and Services In-house Referral: Clinical Social Work Discharge Planning Services: CM Consult Post Acute Care Choice: NA Living arrangements for the past 2 months: Single Family Home Expected Discharge Date: 07/31/23               DME Arranged: N/A DME Agency: NA       HH Arranged: NA           Social Determinants of Health (SDOH) Interventions SDOH Screenings   Food Insecurity: No Food Insecurity (07/26/2023)  Housing: Low Risk  (07/26/2023)   Transportation Needs: No Transportation Needs (07/26/2023)  Utilities: Not At Risk (07/26/2023)  Tobacco Use: Low Risk  (07/25/2023)    Readmission Risk Interventions     No data to display

## 2023-08-05 DIAGNOSIS — I5021 Acute systolic (congestive) heart failure: Secondary | ICD-10-CM | POA: Diagnosis not present

## 2023-08-05 DIAGNOSIS — N1831 Chronic kidney disease, stage 3a: Secondary | ICD-10-CM | POA: Diagnosis not present

## 2023-08-05 DIAGNOSIS — G20A1 Parkinson's disease without dyskinesia, without mention of fluctuations: Secondary | ICD-10-CM | POA: Diagnosis not present

## 2023-08-05 DIAGNOSIS — I420 Dilated cardiomyopathy: Secondary | ICD-10-CM | POA: Diagnosis not present

## 2023-08-05 LAB — COMPREHENSIVE METABOLIC PANEL
ALT: 22 U/L (ref 0–44)
AST: 22 U/L (ref 15–41)
Albumin: 3.1 g/dL — ABNORMAL LOW (ref 3.5–5.0)
Alkaline Phosphatase: 51 U/L (ref 38–126)
Anion gap: 13 (ref 5–15)
BUN: 19 mg/dL (ref 8–23)
CO2: 28 mmol/L (ref 22–32)
Calcium: 9.1 mg/dL (ref 8.9–10.3)
Chloride: 96 mmol/L — ABNORMAL LOW (ref 98–111)
Creatinine, Ser: 1.02 mg/dL — ABNORMAL HIGH (ref 0.44–1.00)
GFR, Estimated: 51 mL/min — ABNORMAL LOW (ref >60)
Glucose, Bld: 155 mg/dL — ABNORMAL HIGH (ref 70–99)
Potassium: 3.6 mmol/L (ref 3.5–5.1)
Sodium: 137 mmol/L (ref 135–145)
Total Bilirubin: 1.3 mg/dL — ABNORMAL HIGH (ref 0.3–1.2)
Total Protein: 6.7 g/dL (ref 6.5–8.1)

## 2023-08-05 LAB — GLUCOSE, CAPILLARY
Glucose-Capillary: 143 mg/dL — ABNORMAL HIGH (ref 70–99)
Glucose-Capillary: 207 mg/dL — ABNORMAL HIGH (ref 70–99)
Glucose-Capillary: 145 mg/dL — ABNORMAL HIGH (ref 70–99)
Glucose-Capillary: 117 mg/dL — ABNORMAL HIGH (ref 70–99)

## 2023-08-05 NOTE — Progress Notes (Signed)
PROGRESS NOTE    Alicia Vang  FAO:[id number] DOB: 25-Feb-1927 DOA: 07/25/2023 PCP: Knox Royalty, MD  Subjective: Patient seen and examined.  No family at bedside.  ST reported yesterday that dtr was at the hospital. CM note reports that pt's appeal was successful but pt lost her bed at [facility] place. Medically stable for discharge.  No change since yesterday.  Lasix, ARB, aldactone stopped due to poor oral intake. Scr improved to 1.0 today.   Hospital Course: HPI: This is a 89 year old female who lives at home with her daughter.  Patient has been bedbound since hip surgery approximately a year ago.  Her past medical history significant for HTN, Parkinson's disease.  Approximately a week ago her daughter noted patient began breathing heavier.  The PCP was contacted, noted directed to monitor patient's oxygen.  Patient's oxygen has been good approximate 95% and average on room air.  Yesterday it was noted patient appeared to be gasping for breath while talking.  SBP check was over 200.  Last send patient had a rough time with breathing, eventually stating she did not feel right.  Appetite has been low, there is no reports of fevers or chills.  Patient does not complain of chest pains.  Daughter repeats swelling on the right lower extremity.  She additionally states the patient occasionally chokes when she is not on food or drinks water, this has been slightly worse.  The patient was states to the ER.   In the ER patient hemodynamically stable.  Potassium 3.1, serum glucose 262, BNP >4,500. COVID-negative. CXR moderate B/L fluid effusion.  Patient mildly hypoxic satting 90-91%.  She has been placed on 2 L oxygen, satting 100%.    Significant Events: Admitted 07/25/2023 for new CHF   Significant Labs: Admitting BNP >4500  Significant Imaging Studies: 07-25-2023 CXR shows moderate bilateral pleural effusions  Antibiotic Therapy: Anti-infectives (From admission, onward)    None        Procedures: 07-26-2023 left side thoracentesis for 400 ml  Consultants: Cardiology Palliative care    Assessment and Plan: * Acute systolic heart failure (HCC) Admitted for acute CHF. Echo shows LVEF of 25 to 30%.  Pt with parkinson's disease. Mentation is quite poor. Possibly dementia-related to parkinson's. Medically stable for discharge. Reported to me that family is appealing DC decision.  Pt on RA. Lying supine. No respiratory distress. Pt has received maximum inpatient hospital benefit. Repeat CMP, Mg in AM. Pt is not receiving any IV medications. Pt is medically stable for discharge.  Cardiology recommended medical management. They will not pursue any further workup.  08-02-2023 BUN/Scr stable (12/0.9) 08-05-2023 due to poor oral intake and rising Scr, her lasix, ARB and aldactone were stoppedon 08-04-2023. Repeat BUN/Scr now 19/1.02. will leave off ARB and diuretics.  Dilated cardiomyopathy (HCC) Stable.  Euvolemic.  There is a question of whether pt has infiltrative cardiomyopathy(I.e. amyloid). Cardiology has reviewed case and decided that given pt's poor functional status, multiple co-morbidities, that invasive workup in not in patient's best interest. No further therapy or investigation is warranted.  08-04-2023 Given increase in Scr, will stop aldactone and ARB.  Chronic kidney disease, stage 3a (HCC) Baseline creatinine approximately 1.0.  Does not have a primary care provider within the [facility] health system.  No records available from PCP.  08-04-2023. Increase in Scr to 1.26. will stop aldactone and ARB due to poor po intake. Repeat CMP in AM. 08-05-2023. Scr back down to 1.02. BUN 19. Keep off ARB, lasix and  aldactone.  Primary hypertension Patient remains on Toprol-XL 50 mg daily Due to rising Scr, pt's lasix, aldactone and losartan were stopped on 08-04-2023 due to poor oral intake.  Repeat BUN/Scr 19/1.02  Parkinson's disease (HCC) Chronic.  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  Family seems in denial of pt's overall poor prognosis. Palliative care was consulted. Family resistant in accepting pt's diagnoses and overall poor outcome. Pt remains full scope of care and full code. I fully expect pt to do very poorly in the next few weeks to months.  Type 2 diabetes mellitus without complication, without long-term current use of insulin (HCC) Remains on sliding scale insulin.  Admission A1c is 6.9%. Stable.  Acute hypoxic respiratory failure (HCC) Weaned to RA after thoracentesis and diuresis. Resolved.  Pressure injury of skin Noted by RN on 07-26-2023 on admission assessment.  Seen by wound care 08-02-2023 upper sacrum with Stage 2 pressure injury, 1X.5X.1cm, pink and dry, lower sacrum with dark red-brown Deep tissue pressure injury, 3X1cm.   Protein-calorie malnutrition, severe See by RD on 07-27-2023.  Severe Malnutrition related to chronic illness (CHF, Parkinson's) as evidenced by severe fat depletion, severe muscle depletion    Pleural effusion on left S/p thoracentesis on 07-26-2023 for 400 ml   DVT prophylaxis: enoxaparin (LOVENOX) injection 30 mg Start: 07/30/23 1100    Code Status: Full Code Family Communication: no family at bedside.  Disposition Plan: SNF. Family needs to decide on facility tomorrow. SNF appeal approved per CM note from yesterday Reason for continuing need for hospitalization: medically stable for DC  Objective: Vitals:   08/04/23 2055 08/05/23 0040 08/05/23 0332 08/05/23 0722  BP:  114/63 (!) 140/74 130/61  Pulse:  72 (!) 107 (!) 56  Resp:  (!) 21 19 20   Temp: 98.6 F (37 C) 100.2 F (37.9 C) 98.6 F (37 C) 98.2 F (36.8 C)  TempSrc: Oral Oral Oral Oral  SpO2:  94% 96% 97%  Weight:   38.2 kg   Height:        Intake/Output Summary (Last 24 hours) at 08/05/2023 1157 Last data filed at 08/05/2023 0045 Gross per 24 hour  Intake 50 ml  Output 850 ml  Net -800 ml   Filed Weights    08/03/23 0510 08/04/23 0410 08/05/23 0332  Weight: 39.3 kg 39.5 kg 38.2 kg    Examination:  Physical Exam Vitals and nursing note reviewed.  Constitutional:      General: She is not in acute distress.    Appearance: She is not toxic-appearing or diaphoretic.     Comments: Frail and elderly appearing female. Lying right lateral recumbent and curled up in fetal position.  No respiratory distress. On RA.  HENT:     Head: Normocephalic and atraumatic.  Cardiovascular:     Rate and Rhythm: Normal rate and regular rhythm.  Pulmonary:     Effort: Pulmonary effort is normal.  Abdominal:     General: Bowel sounds are normal. There is no distension.     Palpations: Abdomen is soft.  Musculoskeletal:     Right lower leg: No edema.     Left lower leg: No edema.  Skin:    Capillary Refill: Capillary refill takes less than 2 seconds.  Neurological:     Comments: Sleeping this AM.    Data Reviewed: I have personally reviewed following labs and imaging studies  CBC: Recent Labs  Lab 08/04/23 0350  WBC 4.4  NEUTROABS 3.5  HGB 11.5*  HCT 35.6*  MCV 91.3  PLT 172   Basic Metabolic Panel: Recent Labs  Lab 07/30/23 0359 07/31/23 0335 08/02/23 0329 08/04/23 0350 08/05/23 0410  NA 137 136 135 137 137  K 3.8 3.5 3.9 3.6 3.6  CL 96* 96* 96* 97* 96*  CO2 28 29 29 27 28   GLUCOSE 112* 88 168* 164* 155*  BUN 15 17 12 18 19   CREATININE 0.99 0.93 0.95 1.26* 1.02*  CALCIUM 8.6* 8.4* 8.8* 9.1 9.1  MG 2.2 2.2 2.2 2.3  --    GFR: Estimated Creatinine Clearance: 20.8 mL/min (A) (by C-G formula based on SCr of 1.02 mg/dL (H)). Liver Function Tests: Recent Labs  Lab 08/02/23 0329 08/04/23 0350 08/05/23 0410  AST 23 21 22   ALT 23 22 22   ALKPHOS 53 52 51  BILITOT 1.1 0.9 1.3*  PROT 6.4* 6.8 6.7  ALBUMIN 3.2* 3.2* 3.1*   CBG: Recent Labs  Lab 08/04/23 1159 08/04/23 1633 08/04/23 2102 08/05/23 0610 08/05/23 1126  GLUCAP 239* 146* 146* 143* 207*    Radiology Studies: No  results found.  Scheduled Meds:  brinzolamide  1 drop Both Eyes QHS   enoxaparin (LOVENOX) injection  30 mg Subcutaneous Q24H   feeding supplement  1 Container Oral TID BM   insulin aspart  0-15 Units Subcutaneous TID AC & HS   latanoprost  1 drop Both Eyes QHS   metoprolol succinate  50 mg Oral Daily   multivitamin with minerals  1 tablet Oral Daily   sodium chloride flush  3 mL Intravenous Q12H   Vitamin D (Ergocalciferol)  50,000 Units Oral Weekly   Continuous Infusions:   LOS: 11 days   Time spent: 35 minutes  Carollee Herter, DO  [facility]  08/05/2023, 11:57 AM

## 2023-08-05 NOTE — Plan of Care (Signed)
  Problem: Education: Goal: Ability to describe self-care measures that may prevent or decrease complications (Diabetes Survival Skills Education) will improve Outcome: Progressing Goal: Individualized Educational Video(s) 08/05/2023 1527 by Lorrin Jackson D, RN Outcome: Progressing 08/05/2023 1526 by Lorrin Jackson D, RN Outcome: Progressing   Problem: Coping: Goal: Ability to adjust to condition or change in health will improve 08/05/2023 1527 by Lorrin Jackson D, RN Outcome: Progressing 08/05/2023 1526 by Zada Finders, RN Outcome: Progressing

## 2023-08-05 NOTE — Plan of Care (Signed)
Problem: Education: Goal: Ability to describe self-care measures that may prevent or decrease complications (Diabetes Survival Skills Education) will improve Outcome: Progressing Goal: Individualized Educational Video(s) Outcome: Progressing   Problem: Coping: Goal: Ability to adjust to condition or change in health will improve Outcome: Progressing   Problem: Fluid Volume: Goal: Ability to maintain a balanced intake and output will improve Outcome: Progressing   Problem: Health Behavior/Discharge Planning: Goal: Ability to identify and utilize available resources and services will improve Outcome: Progressing Goal: Ability to manage health-related needs will improve Outcome: Progressing   Problem: Metabolic: Goal: Ability to maintain appropriate glucose levels will improve Outcome: Progressing   Problem: Nutritional: Goal: Maintenance of adequate nutrition will improve Outcome: Progressing Goal: Progress toward achieving an optimal weight will improve Outcome: Progressing   Problem: Skin Integrity: Goal: Risk for impaired skin integrity will decrease Outcome: Progressing   Problem: Tissue Perfusion: Goal: Adequacy of tissue perfusion will improve Outcome: Progressing   Problem: Education: Goal: Knowledge of General Education information will improve Description: Including pain rating scale, medication(s)/side effects and non-pharmacologic comfort measures Outcome: Progressing   Problem: Health Behavior/Discharge Planning: Goal: Ability to manage health-related needs will improve Outcome: Progressing   Problem: Clinical Measurements: Goal: Ability to maintain clinical measurements within normal limits will improve Outcome: Progressing Goal: Will remain free from infection Outcome: Progressing Goal: Diagnostic test results will improve Outcome: Progressing Goal: Respiratory complications will improve Outcome: Progressing Goal: Cardiovascular complication will  be avoided Outcome: Progressing   Problem: Activity: Goal: Risk for activity intolerance will decrease Outcome: Progressing   Problem: Nutrition: Goal: Adequate nutrition will be maintained Outcome: Progressing   Problem: Coping: Goal: Level of anxiety will decrease Outcome: Progressing   Problem: Elimination: Goal: Will not experience complications related to bowel motility Outcome: Progressing Goal: Will not experience complications related to urinary retention Outcome: Progressing   Problem: Pain Managment: Goal: General experience of comfort will improve Outcome: Progressing   Problem: Safety: Goal: Ability to remain free from injury will improve Outcome: Progressing   Problem: Skin Integrity: Goal: Risk for impaired skin integrity will decrease Outcome: Progressing   Problem: Education: Goal: Ability to demonstrate management of disease process will improve Outcome: Progressing Goal: Ability to verbalize understanding of medication therapies will improve Outcome: Progressing Goal: Individualized Educational Video(s) Outcome: Progressing   Problem: Activity: Goal: Capacity to carry out activities will improve Outcome: Progressing   Problem: Cardiac: Goal: Ability to achieve and maintain adequate cardiopulmonary perfusion will improve Outcome: Progressing

## 2023-08-06 DIAGNOSIS — I10 Essential (primary) hypertension: Secondary | ICD-10-CM | POA: Diagnosis not present

## 2023-08-06 DIAGNOSIS — N1831 Chronic kidney disease, stage 3a: Secondary | ICD-10-CM | POA: Diagnosis not present

## 2023-08-06 DIAGNOSIS — I5021 Acute systolic (congestive) heart failure: Secondary | ICD-10-CM | POA: Diagnosis not present

## 2023-08-06 DIAGNOSIS — I420 Dilated cardiomyopathy: Secondary | ICD-10-CM | POA: Diagnosis not present

## 2023-08-06 LAB — GLUCOSE, CAPILLARY
Glucose-Capillary: 147 mg/dL — ABNORMAL HIGH (ref 70–99)
Glucose-Capillary: 144 mg/dL — ABNORMAL HIGH (ref 70–99)
Glucose-Capillary: 182 mg/dL — ABNORMAL HIGH (ref 70–99)
Glucose-Capillary: 92 mg/dL (ref 70–99)
Glucose-Capillary: 133 mg/dL — ABNORMAL HIGH (ref 70–99)

## 2023-08-06 MED ORDER — HYDRALAZINE HCL 10 MG PO TABS
10.0000 mg | ORAL_TABLET | Freq: Three times a day (TID) | ORAL | Status: DC
  Administered 2023-08-06 – 2023-08-09 (×6): 10 mg via ORAL
  Filled 2023-08-06 (×7): qty 1

## 2023-08-06 NOTE — TOC Progression Note (Addendum)
Transition of Care [facility]) - Progression Note    Patient Details  Name: Alicia Vang MRN: [id number] Date of Birth: 1927-07-09  [facility] CM/SW Contact  Lorri Frederick, LCSW Phone Number: 08/06/2023, 9:58 AM  Clinical Narrative:   CSW spoke with pt daughter [name] regarding [facility].  She has spoken to business office and they will not accept pt back until past balance taken care of.  She is trying to call someone else to discuss.  [name] aware that pt is stable for DC.  If cannot admit to [facility] does want to move forward with [facility].  CSW spoke with [facility].  No female beds available today or tomorrow.  Earliest would be Wed.  1100: CSW spoke with [name] again.  No progress with [facility].  Updated her that [facility] does not have bed today or tomorrow.  Discussed other options and she is also interested in [facility].   CSW spoke with [facility].  She will need info on how many SNF days left if there are concerns about family paying.  CSW spoke with [facility].  Per her records, pt was at [facility] and DC on 08/05/22.  Pt was at [facility] 06/06/21-06/23/21.  She does not have dates on the earlier admit to [facility], which was before 2022 admit  1430: CSW spoke with [facility], shared above info, she can accept pt, however, she is not sure if she will have female bed today.  1510: TC daughter [name]: she has continued to talk with corporate office of [facility] and is now being told they will consider pt for admission at [facility].  But not [facility].  Referral sent to [facility], called [facility] and she will review.  CSW HAS BEEN INFORMED PT WILL NEED NEW AUTH IF NOT GOING TO [facility].    Expected Discharge Plan: Skilled Nursing Facility Barriers to Discharge: Continued Medical Work up  Expected Discharge Plan and Services In-house Referral: Clinical Social Work Discharge Planning Services: CM Consult Post Acute Care Choice:  NA Living arrangements for the past 2 months: Single Family Home Expected Discharge Date: 07/31/23               DME Arranged: N/A DME Agency: NA       HH Arranged: NA           Social Determinants of Health (SDOH) Interventions SDOH Screenings   Food Insecurity: No Food Insecurity (07/26/2023)  Housing: Low Risk  (07/26/2023)  Transportation Needs: No Transportation Needs (07/26/2023)  Utilities: Not At Risk (07/26/2023)  Tobacco Use: Low Risk  (07/25/2023)    Readmission Risk Interventions     No data to display

## 2023-08-06 NOTE — Progress Notes (Signed)
PROGRESS NOTE    Alicia Vang  VOZ:[id number] DOB: Jun 04, 1927 DOA: 07/25/2023 PCP: Knox Royalty, MD  Subjective: Patient seen and examined.  No family at bedside.  CM note reports that pt's appeal was successful but pt lost her bed at [facility] place. CM looking for other SNF beds. Medically stable for discharge.  No change since yesterday.     Hospital Course: HPI: This is a 89 year old female who lives at home with her daughter.  Patient has been bedbound since hip surgery approximately a year ago.  Her past medical history significant for HTN, Parkinson's disease.  Approximately a week ago her daughter noted patient began breathing heavier.  The PCP was contacted, noted directed to monitor patient's oxygen.  Patient's oxygen has been good approximate 95% and average on room air.  Yesterday it was noted patient appeared to be gasping for breath while talking.  SBP check was over 200.  Last send patient had a rough time with breathing, eventually stating she did not feel right.  Appetite has been low, there is no reports of fevers or chills.  Patient does not complain of chest pains.  Daughter repeats swelling on the right lower extremity.  She additionally states the patient occasionally chokes when she is not on food or drinks water, this has been slightly worse.  The patient was states to the ER.   In the ER patient hemodynamically stable.  Potassium 3.1, serum glucose 262, BNP >4,500. COVID-negative. CXR moderate B/L fluid effusion.  Patient mildly hypoxic satting 90-91%.  She has been placed on 2 L oxygen, satting 100%.    Significant Events: Admitted 07/25/2023 for new CHF   Significant Labs: Admitting BNP >4500  Significant Imaging Studies: 07-25-2023 CXR shows moderate bilateral pleural effusions  Antibiotic Therapy: Anti-infectives (From admission, onward)    None       Procedures: 07-26-2023 left side thoracentesis for 400  ml  Consultants: Cardiology Palliative care    Assessment and Plan: * Acute systolic heart failure (HCC) Admitted for acute CHF. Echo shows LVEF of 25 to 30%.  Pt with parkinson's disease. Mentation is quite poor. Possibly dementia-related to parkinson's. Medically stable for discharge. Reported to me that family is appealing DC decision.  Pt on RA. Lying supine. No respiratory distress. Pt has received maximum inpatient hospital benefit. Repeat CMP, Mg in AM. Pt is not receiving any IV medications. Pt is medically stable for discharge.  Cardiology recommended medical management. They will not pursue any further workup.  08-02-2023 BUN/Scr stable (12/0.9) 08-05-2023 due to poor oral intake and rising Scr, her lasix, ARB and aldactone were stoppedon 08-04-2023. Repeat BUN/Scr now 19/1.02. will leave off ARB and diuretics.  Dilated cardiomyopathy (HCC) Stable.  Euvolemic.  There is a question of whether pt has infiltrative cardiomyopathy(I.e. amyloid). Cardiology has reviewed case and decided that given pt's poor functional status, multiple co-morbidities, that invasive workup in not in patient's best interest. No further therapy or investigation is warranted.  08-04-2023 Given increase in Scr, will stop aldactone and ARB.  Chronic kidney disease, stage 3a (HCC) Baseline creatinine approximately 1.0.  Does not have a primary care provider within the [facility] health system.  No records available from PCP.  08-04-2023. Increase in Scr to 1.26. will stop aldactone and ARB due to poor po intake. Repeat CMP in AM. 08-05-2023. Scr back down to 1.02. BUN 19. Keep off ARB, lasix and aldactone.  Primary hypertension Patient remains on Toprol-XL 50 mg daily Due to rising Scr, pt's  lasix, aldactone and losartan were stopped on 08-04-2023 due to poor oral intake.  08-05-2023 Repeat BUN/Scr 19/1.02 08-06-2023 will add hydralazine 10 mg tid. Needs alternative BP meds that do not effect kidney function.  Continue with Toprol-XL 50 mg daily. Baseline HR already at 60 bpm. Not much room to increase betablocker.  Parkinson's disease (HCC) Chronic.  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  Family seems in denial of pt's overall poor prognosis. Palliative care was consulted. Family resistant in accepting pt's diagnoses and overall poor outcome. Pt remains full scope of care and full code. I fully expect pt to do very poorly in the next few weeks to months.  Type 2 diabetes mellitus without complication, without long-term current use of insulin (HCC) Remains on sliding scale insulin.  Admission A1c is 6.9%. Stable.  Acute hypoxic respiratory failure (HCC) Weaned to RA after thoracentesis and diuresis. Resolved.  Pressure injury of skin Noted by RN on 07-26-2023 on admission assessment.  Seen by wound care 08-02-2023 upper sacrum with Stage 2 pressure injury, 1X.5X.1cm, pink and dry, lower sacrum with dark red-brown Deep tissue pressure injury, 3X1cm.   Protein-calorie malnutrition, severe See by RD on 07-27-2023.  Severe Malnutrition related to chronic illness (CHF, Parkinson's) as evidenced by severe fat depletion, severe muscle depletion    Pleural effusion on left S/p thoracentesis on 07-26-2023 for 400 ml   DVT prophylaxis: enoxaparin (LOVENOX) injection 30 mg Start: 07/30/23 1100    Code Status: Full Code Family Communication: no family at bedside. Reports of family only coming one time to see her in the last 5-6 days. Disposition Plan: SNF Reason for continuing need for hospitalization: medically stable for discharge.  Objective: Vitals:   08/05/23 1935 08/06/23 0345 08/06/23 0519 08/06/23 0749  BP: (!) 112/57 (!) 101/49  (!) 161/78  Pulse: (!) 49 65  61  Resp: (!) 22 15  (!) 22  Temp: 98.6 F (37 C) 98.8 F (37.1 C)  98 F (36.7 C)  TempSrc: Axillary Axillary  Axillary  SpO2: 100% 96%  98%  Weight:   40.4 kg   Height:        Intake/Output Summary (Last 24 hours)  at 08/06/2023 0759 Last data filed at 08/05/2023 1336 Gross per 24 hour  Intake 120 ml  Output --  Net 120 ml   Filed Weights   08/04/23 0410 08/05/23 0332 08/06/23 0519  Weight: 39.5 kg 38.2 kg 40.4 kg    Examination:  Physical Exam Vitals and nursing note reviewed.  Constitutional:      General: She is not in acute distress.    Appearance: She is not toxic-appearing or diaphoretic.     Comments: Frail and elderly appearing female. Lying right lateral recumbent and curled up in fetal position.  No respiratory distress. On RA.  HENT:     Head: Normocephalic and atraumatic.  Cardiovascular:     Rate and Rhythm: Normal rate and regular rhythm.  Pulmonary:     Effort: Pulmonary effort is normal.  Abdominal:     General: Bowel sounds are normal. There is no distension.     Palpations: Abdomen is soft.  Musculoskeletal:     Right lower leg: No edema.     Left lower leg: No edema.  Skin:    Capillary Refill: Capillary refill takes less than 2 seconds.  Neurological:     Comments: Sleeping this AM.    Data Reviewed: I have personally reviewed following labs and imaging studies  CBC: Recent Labs  Lab 08/04/23 0350  WBC 4.4  NEUTROABS 3.5  HGB 11.5*  HCT 35.6*  MCV 91.3  PLT 172   Basic Metabolic Panel: Recent Labs  Lab 07/31/23 0335 08/02/23 0329 08/04/23 0350 08/05/23 0410  NA 136 135 137 137  K 3.5 3.9 3.6 3.6  CL 96* 96* 97* 96*  CO2 29 29 27 28   GLUCOSE 88 168* 164* 155*  BUN 17 12 18 19   CREATININE 0.93 0.95 1.26* 1.02*  CALCIUM 8.4* 8.8* 9.1 9.1  MG 2.2 2.2 2.3  --    GFR: Estimated Creatinine Clearance: 22 mL/min (A) (by C-G formula based on SCr of 1.02 mg/dL (H)). Liver Function Tests: Recent Labs  Lab 08/02/23 0329 08/04/23 0350 08/05/23 0410  AST 23 21 22   ALT 23 22 22   ALKPHOS 53 52 51  BILITOT 1.1 0.9 1.3*  PROT 6.4* 6.8 6.7  ALBUMIN 3.2* 3.2* 3.1*   BNP (last 3 results) Recent Labs    07/25/23 2108  BNP >4,500.0*    CBG: Recent Labs  Lab 08/05/23 0610 08/05/23 1126 08/05/23 1543 08/05/23 2052 08/06/23 0540  GLUCAP 143* 207* 145* 117* 147*    Radiology Studies: No results found.  Scheduled Meds:  brinzolamide  1 drop Both Eyes QHS   enoxaparin (LOVENOX) injection  30 mg Subcutaneous Q24H   feeding supplement  1 Container Oral TID BM   hydrALAZINE  10 mg Oral Q8H   insulin aspart  0-15 Units Subcutaneous TID AC & HS   latanoprost  1 drop Both Eyes QHS   metoprolol succinate  50 mg Oral Daily   multivitamin with minerals  1 tablet Oral Daily   sodium chloride flush  3 mL Intravenous Q12H   Vitamin D (Ergocalciferol)  50,000 Units Oral Weekly   Continuous Infusions:   LOS: 12 days   Time spent: 35 minutes  Carollee Herter, DO  [facility]  08/06/2023, 7:59 AM

## 2023-08-07 ENCOUNTER — Inpatient Hospital Stay (HOSPITAL_COMMUNITY): Payer: Medicare PPO

## 2023-08-07 DIAGNOSIS — I5021 Acute systolic (congestive) heart failure: Secondary | ICD-10-CM | POA: Diagnosis not present

## 2023-08-07 DIAGNOSIS — Z9189 Other specified personal risk factors, not elsewhere classified: Secondary | ICD-10-CM

## 2023-08-07 DIAGNOSIS — I420 Dilated cardiomyopathy: Secondary | ICD-10-CM | POA: Diagnosis not present

## 2023-08-07 DIAGNOSIS — N1831 Chronic kidney disease, stage 3a: Secondary | ICD-10-CM | POA: Diagnosis not present

## 2023-08-07 DIAGNOSIS — E119 Type 2 diabetes mellitus without complications: Secondary | ICD-10-CM | POA: Diagnosis not present

## 2023-08-07 LAB — COMPREHENSIVE METABOLIC PANEL
ALT: 29 U/L (ref 0–44)
AST: 37 U/L (ref 15–41)
Albumin: 2.9 g/dL — ABNORMAL LOW (ref 3.5–5.0)
Alkaline Phosphatase: 50 U/L (ref 38–126)
Anion gap: 10 (ref 5–15)
BUN: 29 mg/dL — ABNORMAL HIGH (ref 8–23)
CO2: 30 mmol/L (ref 22–32)
Calcium: 9.4 mg/dL (ref 8.9–10.3)
Chloride: 97 mmol/L — ABNORMAL LOW (ref 98–111)
Creatinine, Ser: 1.08 mg/dL — ABNORMAL HIGH (ref 0.44–1.00)
GFR, Estimated: 48 mL/min — ABNORMAL LOW (ref >60)
Glucose, Bld: 167 mg/dL — ABNORMAL HIGH (ref 70–99)
Potassium: 3.9 mmol/L (ref 3.5–5.1)
Sodium: 137 mmol/L (ref 135–145)
Total Bilirubin: 0.5 mg/dL (ref 0.3–1.2)
Total Protein: 6.4 g/dL — ABNORMAL LOW (ref 6.5–8.1)

## 2023-08-07 LAB — CBC WITH DIFFERENTIAL/PLATELET
Abs Immature Granulocytes: 0.01 10*3/uL (ref 0.00–0.07)
Basophils Absolute: 0 10*3/uL (ref 0.0–0.1)
Basophils Relative: 1 %
Eosinophils Absolute: 0 10*3/uL (ref 0.0–0.5)
Eosinophils Relative: 1 %
HCT: 36 % (ref 36.0–46.0)
Hemoglobin: 11.5 g/dL — ABNORMAL LOW (ref 12.0–15.0)
Immature Granulocytes: 0 %
Lymphocytes Relative: 21 %
Lymphs Abs: 0.6 10*3/uL — ABNORMAL LOW (ref 0.7–4.0)
MCH: 30.5 pg (ref 26.0–34.0)
MCHC: 31.9 g/dL (ref 30.0–36.0)
MCV: 95.5 fL (ref 80.0–100.0)
Monocytes Absolute: 0.3 10*3/uL (ref 0.1–1.0)
Monocytes Relative: 12 %
Neutro Abs: 1.9 10*3/uL (ref 1.7–7.7)
Neutrophils Relative %: 65 %
Platelets: 176 10*3/uL (ref 150–400)
RBC: 3.77 MIL/uL — ABNORMAL LOW (ref 3.87–5.11)
RDW: 15.2 % (ref 11.5–15.5)
WBC: 3 10*3/uL — ABNORMAL LOW (ref 4.0–10.5)
nRBC: 0 % (ref 0.0–0.2)

## 2023-08-07 LAB — GLUCOSE, CAPILLARY
Glucose-Capillary: 161 mg/dL — ABNORMAL HIGH (ref 70–99)
Glucose-Capillary: 152 mg/dL — ABNORMAL HIGH (ref 70–99)
Glucose-Capillary: 151 mg/dL — ABNORMAL HIGH (ref 70–99)
Glucose-Capillary: 151 mg/dL — ABNORMAL HIGH (ref 70–99)
Glucose-Capillary: 84 mg/dL (ref 70–99)

## 2023-08-07 LAB — MAGNESIUM: Magnesium: 2.4 mg/dL (ref 1.7–2.4)

## 2023-08-07 LAB — BRAIN NATRIURETIC PEPTIDE: B Natriuretic Peptide: 371.2 pg/mL — ABNORMAL HIGH (ref 0.0–100.0)

## 2023-08-07 MED ORDER — AMOXICILLIN-POT CLAVULANATE 500-125 MG PO TABS
1.0000 | ORAL_TABLET | Freq: Two times a day (BID) | ORAL | Status: DC
  Administered 2023-08-07 – 2023-08-08 (×2): 1 via ORAL
  Filled 2023-08-07 (×3): qty 1

## 2023-08-07 MED ORDER — AMOXICILLIN-POT CLAVULANATE 400-57 MG/5ML PO SUSR: 800.0000 mg | Freq: Two times a day (BID) | ORAL | Status: DC

## 2023-08-07 NOTE — TOC Progression Note (Signed)
Transition of Care [facility]) - Progression Note    Patient Details  Name: Alicia Vang MRN: [id number] Date of Birth: [birth year]  Transition of [facility]) CM/SW Contact  Dellie Burns [place], [place] Phone Number: 08/07/2023, 3:40 PM  Clinical Narrative:  spoke to pt's dtr re SNF choice. Updated her on denial from [facility] but offers available from [employer] and [place]. Dtr accepted offer from [employer]. Home and [employer] auth submitted again per instruction from [facility] CM. Auth received for [facility] ref #: [id number] and Plan auth #: [id number], valid 10/14-1018.   Updated [place] in [place] admissions who reports no bed today but there is  potential for tomorrow. Will f/u with [facility] in the AM.   Dellie Burns, MSW, LCSW [phone] (coverage)      Expected Discharge Plan: Skilled Nursing Facility Barriers to Discharge: Continued Medical Work up  Expected Discharge Plan and Services In-house Referral: Clinical Social Work Discharge Planning Services: CM Consult Post Acute Care Choice: NA Living arrangements for the past 2 months: Single Family Home Expected Discharge Date: 07/31/23               DME Arranged: N/A DME Agency: NA       HH Arranged: NA           Social Determinants of Health (SDOH) Interventions SDOH Screenings   Food Insecurity: No Food Insecurity (07/26/2023)  Housing: Low Risk  (07/26/2023)  Transportation Needs: No Transportation Needs (07/26/2023)  Utilities: Not At Risk (07/26/2023)  Tobacco Use: Low Risk  (07/25/2023)    Readmission Risk Interventions     No data to display

## 2023-08-07 NOTE — Progress Notes (Signed)
Physical Therapy Treatment Patient Details Name: Alicia Vang MRN: [id number] DOB: December 03, 1926 Today's Date: 08/07/2023   History of Present Illness 89 yo female presents [facility] 10[facility] with dyspnea, RLE swelling with workup for HF. CXR shows bilat moderate pleural effusions. S/p L thoracentesis on 10/3. PMH includes OA, L THA 2019, DMII, HTN, hypokalemia, parkinsons.    PT Comments  Pt resting upon PT arrival to room, wakes easily. PT focus of session on tolerance for upright sitting, balance intervention, and strengthening. Pt tolerated EOB sitting x10 minutes, preference for R lateral leaning today but correctable with cues. Pt noted to have blistering along buttocks R side, pt placed in pressure relief position in L sidelying at end of session and RN notified. PT to continue to recommend rehab post-acutely to return to PLOF, PT to continue to follow.       If plan is discharge home, recommend the following: Direct supervision/assist for medications management;Assistance with cooking/housework;Assistance with feeding;Assist for transportation;Help with stairs or ramp for entrance;A lot of help with walking and/or transfers;A lot of help with bathing/dressing/bathroom   Can travel by private vehicle        Equipment Recommendations  None recommended by PT    Recommendations for Other Services       Precautions / Restrictions Precautions Precautions: Fall Precaution Comments: blistering R hip near sacrum, pt placed in semi-L sidelying at end of session Restrictions Weight Bearing Restrictions: No     Mobility  Bed Mobility Overal bed mobility: Needs Assistance Bed Mobility: Supine to Sit, Sit to Supine, Rolling Rolling: Max assist   Supine to sit: Mod assist Sit to supine: Max assist   General bed mobility comments: max assist for rolling bilat for trunk and LE management, once pt understands task at hand pt uses UEs on bedrails to assist with roll. mod assist for  supine>sit for LE management and assisting trunk upright, pt using UEs to push up. Max assist for return to supine for all aspects.    Transfers Overall transfer level: Needs assistance   Transfers: Bed to chair/wheelchair/BSC            Lateral/Scoot Transfers: Total assist General transfer comment: total assist for x2 scoots towards [facility], pt reaching for environment to steady self.    Ambulation/Gait               General Gait Details: unable   Stairs             Wheelchair Mobility     Tilt Bed    Modified Rankin (Stroke Patients Only)       Balance Overall balance assessment: Needs assistance Sitting-balance support: No upper extremity supported, Feet supported Sitting balance-[name] Scale: Fair Sitting balance - Comments: sits EOB with supervision x10 minutes, R lateral bias this date. Seated balance exercises include lateral elbow propping bilat, scooting, LE exercise to challenge core     Standing balance-[name] Scale: Zero                              Cognition Arousal: Alert Behavior During Therapy: Flat affect Overall Cognitive Status: Difficult to assess                                 General Comments: pt with little to no verbalization this session, poor command following but responds well to max multimodal cuing  Exercises General Exercises - Lower Extremity Long Arc Quad: AAROM, Both, Seated, 10 reps    General Comments General comments (skin integrity, edema, etc.): vss in afib rhythm      Pertinent Vitals/Pain Pain Assessment Pain Assessment: Faces Faces Pain Scale: Hurts little more Pain Location: generalized Pain Descriptors / Indicators: Guarding Pain Intervention(s): Limited activity within patient's tolerance, Monitored during session, Repositioned    Home Living                          Prior Function            PT Goals (current goals can now be found in the care  plan section) Acute Rehab PT Goals Patient Stated Goal: return to PLOF, which is transfer-level +1 PT Goal Formulation: With patient/family Time For Goal Achievement: 08/10/23 Potential to Achieve Goals: Fair Progress towards PT goals: Progressing toward goals    Frequency    Min 1X/week      PT Plan      Co-evaluation              AM-PAC PT "6 Clicks" Mobility   Outcome Measure  Help needed turning from your back to your side while in a flat bed without using bedrails?: A Lot Help needed moving from lying on your back to sitting on the side of a flat bed without using bedrails?: A Lot Help needed moving to and from a bed to a chair (including a wheelchair)?: Total Help needed standing up from a chair using your arms (e.g., wheelchair or bedside chair)?: Total Help needed to walk in hospital room?: Total Help needed climbing 3-5 steps with a railing? : Total 6 Click Score: 8    End of Session   Activity Tolerance: Patient limited by fatigue Patient left: in bed;with call bell/phone within reach;with bed alarm set;Other (comment) (L sidelying with pillows propping) Nurse Communication: Mobility status PT Visit Diagnosis: Other abnormalities of gait and mobility (R26.89);Muscle weakness (generalized) (M62.81)     Time: 1027-2536 PT Time Calculation (min) (ACUTE ONLY): 20 min  Charges:    $Therapeutic Activity: 8-22 mins PT General Charges $$ ACUTE PT VISIT: 1 Visit                     Marye Round, PT DPT Acute Rehabilitation Services Secure Chat Preferred  Office [phone]    Alicia Vang 08/07/2023, 10:20 AM

## 2023-08-07 NOTE — Significant Event (Signed)
Rapid Response Event Note   Reason for Call :  Vomited and HR drop to 40s  Initial Focused Assessment:  Patient is non verbal, per Rn this is baseline.   She is breathing easily, no increased work of breathing.  No distress Lung sounds decreased bases  She is warm and dry BP 182/119  HR 60  RR 20   O 2 sat difficult to obtain,  warmed foot, then able to obtain O2 sats on toe. CBG 151  O2 sat 100% on NRB, weaned O2 to RA  o2 sats remained 100%  She is looking around.  She is tremulous and has some contracture in all extremities.    Interventions:  Placed on NRB until able to obtain accurate O2 sat.  Then weaned to RA.  Plan of Care:  [facility] RN to call if patient has increased work of breathing/distress or fever.   Event Summary:   MD Notified: Dr David Stall came to bedside Call Time: 1342 Arrival Time: 1344 End Time: 1420  Marcellina Millin, RN

## 2023-08-07 NOTE — Progress Notes (Signed)
Was called by nurse as the patient pulse rate was in the 40s when she went to the room the patient had vomited data placed on a nonrebreather suction her and got about 50 to 75 cc of gastric content, saturations were rechecked she had a good pulse wave and her saturations came up to 95 her rate improved to 60 [place] her blood pressure is 180/65. She likely aspirated which lead to this episode, I am concerned there is going to continue to happen as the patient is nonverbal Bedbound and with Parkinson disease she probably has  dementia, on the day prior to admission she was noted to be gasping for air while talking and her SBP was greater than 200 she probably aspirated at that time again. Will go ahead and get a chest x-ray monitor fever curve,  CBC with differential,  will have low threshold to start IV antibiotic like Unasyn as probably as her age it will be hard for her to mount a fever response.

## 2023-08-07 NOTE — Progress Notes (Addendum)
PROGRESS NOTE    Alicia Vang  QIO:[id number] DOB: 01/24/1927 DOA: 07/25/2023 PCP: Knox Royalty, MD  Subjective: Patient seen and examined.  No family at bedside.  CM looking for other SNF beds. Medically stable for discharge.  No change since yesterday.   Hospital Course: HPI: This is a 89 year old female who lives at home with her daughter.  Patient has been bedbound since hip surgery approximately a year ago.  Her past medical history significant for HTN, Parkinson's disease.  Approximately a week ago her daughter noted patient began breathing heavier.  The PCP was contacted, noted directed to monitor patient's oxygen.  Patient's oxygen has been good approximate 95% and average on room air.  Yesterday it was noted patient appeared to be gasping for breath while talking.  SBP check was over 200.  Last send patient had a rough time with breathing, eventually stating she did not feel right.  Appetite has been low, there is no reports of fevers or chills.  Patient does not complain of chest pains.  Daughter repeats swelling on the right lower extremity.  She additionally states the patient occasionally chokes when she is not on food or drinks water, this has been slightly worse.  The patient was states to the ER.   In the ER patient hemodynamically stable.  Potassium 3.1, serum glucose 262, BNP >4,500. COVID-negative. CXR moderate B/L fluid effusion.  Patient mildly hypoxic satting 90-91%.  She has been placed on 2 L oxygen, satting 100%.    Significant Events: Admitted 07/25/2023 for new CHF   Significant Labs: Admitting BNP >4500  Significant Imaging Studies: 07-25-2023 CXR shows moderate bilateral pleural effusions  Antibiotic Therapy: Anti-infectives (From admission, onward)    None       Procedures: 07-26-2023 left side thoracentesis for 400 ml  Consultants: Cardiology Palliative care    Assessment and Plan: * Acute systolic heart failure (HCC) Admitted for  acute CHF. Echo shows LVEF of 25 to 30%.  Pt with parkinson's disease. Mentation is quite poor. Possibly dementia-related to parkinson's. Medically stable for discharge. Reported to me that family is appealing DC decision.  Pt on RA. Lying supine. No respiratory distress. Pt has received maximum inpatient hospital benefit. Repeat CMP, Mg in AM. Pt is not receiving any IV medications. Pt is medically stable for discharge.  Cardiology recommended medical management. They will not pursue any further workup.  08-02-2023 BUN/Scr stable (12/0.9) 08-05-2023 due to poor oral intake and rising Scr, her lasix, ARB and aldactone were stoppedon 08-04-2023. Repeat BUN/Scr now 19/1.02. will leave off ARB and diuretics.  08-07-2023  BUN/Scr 29/1.08. will need RN/NT to push po fluids.  At risk for deficient intake of food/liquids Pt with profound anorexia. Family does not realize or want to realize pt's poor prognosis. Palliative care team despite valiant effort has been unable to convince family of anything else. Pt at high risk for decline with either aspiration, dehydration, worsening decubitus ulcerations.  She will likely be readmitted for aspiration, dehydration/AKI, worsening decubitus ulcers, FTT or other problems related to poor nutrition status, debility and immobility.  Dilated cardiomyopathy (HCC) Stable.  Euvolemic.  There is a question of whether pt has infiltrative cardiomyopathy(I.e. amyloid). Cardiology has reviewed case and decided that given pt's poor functional status, multiple co-morbidities, that invasive workup in not in patient's best interest. No further therapy or investigation is warranted.  08-04-2023 Given increase in Scr, will stop aldactone and ARB.  Chronic kidney disease, stage 3a (HCC) Baseline creatinine approximately 1.0.  Does not have a primary care provider within the [facility] health system.  No records available from PCP.  08-04-2023. Increase in Scr to 1.26. will stop  aldactone and ARB due to poor po intake. Repeat CMP in AM. 08-05-2023. Scr back down to 1.02. BUN 19. Keep off ARB, lasix and aldactone. 08-07-2023 BUN/Scr 29/1.08. asked RN/NT to push PO fluids.  Primary hypertension Patient remains on Toprol-XL 50 mg daily Due to rising Scr, pt's lasix, aldactone and losartan were stopped on 08-04-2023 due to poor oral intake.  08-05-2023 Repeat BUN/Scr 19/1.02 08-06-2023 will add hydralazine 10 mg tid. Needs alternative BP meds that do not effect kidney function. Continue with Toprol-XL 50 mg daily. Baseline HR already at 60 bpm. Not much room to increase betablocker.  08-07-2023 BP has improved with added hydralazine  Parkinson's disease (HCC) Chronic.  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  Family seems in denial of pt's overall poor prognosis. Palliative care was consulted. Family resistant in accepting pt's diagnoses and overall poor outcome. Pt remains full scope of care and full code. I fully expect pt to do very poorly in the next few weeks to months.  Type 2 diabetes mellitus without complication, without long-term current use of insulin (HCC) Remains on sliding scale insulin.  Admission A1c is 6.9%. Stable.  Acute hypoxic respiratory failure (HCC) Weaned to RA after thoracentesis and diuresis. Resolved.  Pressure injury of skin Noted by RN on 07-26-2023 on admission assessment.  Seen by wound care 08-02-2023 upper sacrum with Stage 2 pressure injury, 1X.5X.1cm, pink and dry, lower sacrum with dark red-brown Deep tissue pressure injury, 3X1cm.   Protein-calorie malnutrition, severe See by RD on 07-27-2023.  Severe Malnutrition related to chronic illness (CHF, Parkinson's) as evidenced by severe fat depletion, severe muscle depletion    Pleural effusion on left S/p thoracentesis on 07-26-2023 for 400 ml   DVT prophylaxis: enoxaparin (LOVENOX) injection 30 mg Start: 07/30/23 1100    Code Status: Full Code Family Communication:  no family at bedside. Reports of family being at the hospital only once this entire 7 days I have seen her. Disposition Plan: SNF Reason for continuing need for hospitalization: medically stable for DC.  Objective: Vitals:   08/07/23 0808 08/07/23 0841 08/07/23 1215 08/07/23 1400  BP: 122/74  110/60   Pulse:  66 (!) 227   Resp: (!) 21  20   Temp: 97.8 F (36.6 C)  97.7 F (36.5 C) 98 F (36.7 C)  TempSrc: Oral  Oral Oral  SpO2:   (!) 66%   Weight:      Height:        Intake/Output Summary (Last 24 hours) at 08/07/2023 1621 Last data filed at 08/07/2023 0710 Gross per 24 hour  Intake 80 ml  Output 200 ml  Net -120 ml   Filed Weights   08/05/23 0332 08/06/23 0519 08/07/23 0144  Weight: 38.2 kg 40.4 kg 40.4 kg   Examination:  Physical Exam Vitals and nursing note reviewed.  Constitutional:      General: She is not in acute distress.    Appearance: She is not toxic-appearing or diaphoretic.     Comments: Frail and elderly appearing female. Lying semi-right lateral recumbent and curled up in fetal position.  No respiratory distress. On RA.  HENT:     Head: Normocephalic and atraumatic.  Cardiovascular:     Rate and Rhythm: Normal rate and regular rhythm.  Pulmonary:     Effort: Pulmonary effort is normal.  Abdominal:  General: Bowel sounds are normal. There is no distension.     Palpations: Abdomen is soft.  Musculoskeletal:     Right lower leg: No edema.     Left lower leg: No edema.  Skin:    General: Skin is warm and dry.     Capillary Refill: Capillary refill takes less than 2 seconds.  Neurological:     Comments: Sleeping this AM. Has resting tremor of right hand    Data Reviewed: I have personally reviewed following labs and imaging studies  CBC: Recent Labs  Lab 08/04/23 0350 08/07/23 1442  WBC 4.4 3.0*  NEUTROABS 3.5 1.9  HGB 11.5* 11.5*  HCT 35.6* 36.0  MCV 91.3 95.5  PLT 172 176   Basic Metabolic Panel: Recent Labs  Lab 08/02/23 0329  08/04/23 0350 08/05/23 0410 08/07/23 0418  NA 135 137 137 137  K 3.9 3.6 3.6 3.9  CL 96* 97* 96* 97*  CO2 29 27 28 30   GLUCOSE 168* 164* 155* 167*  BUN 12 18 19  29*  CREATININE 0.95 1.26* 1.02* 1.08*  CALCIUM 8.8* 9.1 9.1 9.4  MG 2.2 2.3  --  2.4   GFR: Estimated Creatinine Clearance: 20.8 mL/min (A) (by C-G formula based on SCr of 1.08 mg/dL (H)). Liver Function Tests: Recent Labs  Lab 08/02/23 0329 08/04/23 0350 08/05/23 0410 08/07/23 0418  AST 23 21 22  37  ALT 23 22 22 29   ALKPHOS 53 52 51 50  BILITOT 1.1 0.9 1.3* 0.5  PROT 6.4* 6.8 6.7 6.4*  ALBUMIN 3.2* 3.2* 3.1* 2.9*   BNP (last 3 results) Recent Labs    07/25/23 2108 08/07/23 0418  BNP >4,500.0* 371.2*   CBG: Recent Labs  Lab 08/06/23 2049 08/07/23 0559 08/07/23 1211 08/07/23 1348 08/07/23 1608  GLUCAP 133* 161* 152* 151* 151*   Radiology Studies: No results found.  Scheduled Meds:  amoxicillin-clavulanate  800 mg Oral Q12H   brinzolamide  1 drop Both Eyes QHS   enoxaparin (LOVENOX) injection  30 mg Subcutaneous Q24H   feeding supplement  1 Container Oral TID BM   hydrALAZINE  10 mg Oral Q8H   insulin aspart  0-15 Units Subcutaneous TID AC & HS   latanoprost  1 drop Both Eyes QHS   metoprolol succinate  50 mg Oral Daily   multivitamin with minerals  1 tablet Oral Daily   sodium chloride flush  3 mL Intravenous Q12H   Vitamin D (Ergocalciferol)  50,000 Units Oral Weekly   Continuous Infusions:   LOS: 13 days   Time spent: 35 minutes  Carollee Herter, DO  [facility]  08/07/2023, 4:21 PM

## 2023-08-07 NOTE — Plan of Care (Signed)
Problem: Nutritional: Goal: Maintenance of adequate nutrition will improve Outcome: Progressing

## 2023-08-07 NOTE — Assessment & Plan Note (Deleted)
Pt with profound anorexia. Family does not realize or want to realize pt's poor prognosis. Palliative care team despite valiant effort has been unable to convince family of anything else. Pt at high risk for decline with either aspiration, dehydration, worsening decubitus ulcerations.  She will likely be readmitted for aspiration, dehydration/AKI, worsening decubitus ulcers, FTT or other problems related to poor nutrition status, debility and immobility.

## 2023-08-07 NOTE — Progress Notes (Signed)
Occupational Therapy Treatment Patient Details Name: Alicia Vang MRN: [id number] DOB: Jun 15, 1927 Today's Date: 08/07/2023   History of present illness 89 yo female presents to [facility] on 10/2 with dyspnea, RLE swelling with workup for HF. CXR shows bilat moderate pleural effusions. S/p L thoracentesis on 10/3. PMH includes OA, L THA 2019, DMII, HTN, hypokalemia, parkinsons.   OT comments  Pt progressing toward established OT goals. Pt with fair initiation of mobility and ADL this session and good eye contact with therapist. One verbalization this session stating "good morning". Pt needing max A for bed mobility this session. Able to sit EOB with supervision A. Pt with engaged in ADL and reaching with up to max A. Patient will benefit from continued inpatient follow up therapy, <3 hours/day       If plan is discharge home, recommend the following:  Two people to help with walking and/or transfers;Two people to help with bathing/dressing/bathroom;Help with stairs or ramp for entrance;Direct supervision/assist for financial management;Assist for transportation;Direct supervision/assist for medications management;Assistance with feeding;Assistance with cooking/housework   Equipment Recommendations  Other (comment) (defer next venue)    Recommendations for Other Services      Precautions / Restrictions Precautions Precautions: Fall Precaution Comments: blistering R hip near sacrum, pt placed in semi-L sidelying at end of session Restrictions Weight Bearing Restrictions: No       Mobility Bed Mobility Overal bed mobility: Needs Assistance Bed Mobility: Supine to Sit, Sit to Supine, Rolling Rolling: Max assist   Supine to sit: Max assist Sit to supine: Max assist   General bed mobility comments: assist for all portions. Pt with good initiation reaching for bed rail with increased time.    Transfers Overall transfer level: Needs assistance   Transfers: Bed to  chair/wheelchair/BSC            Lateral/Scoot Transfers: Total assist General transfer comment: to scoot up higher in bed prior to return to supine.     Balance Overall balance assessment: Needs assistance Sitting-balance support: No upper extremity supported, Feet supported Sitting balance-[name] Scale: Fair Sitting balance - Comments: posterior bias but able to maintain static sititng                                   ADL either performed or assessed with clinical judgement   ADL Overall ADL's : Needs assistance/impaired     Grooming: Maximal assistance;Total assistance;Sitting;Wash/dry face;Oral care Grooming Details (indicate cue type and reason): Pt with fair initiation but needing up to max A to wash entirety of face; Fair initition of oral care as well but ultimately total A                                    Extremity/Trunk Assessment Upper Extremity Assessment Upper Extremity Assessment: Generalized weakness   Lower Extremity Assessment Lower Extremity Assessment: Defer to PT evaluation        Vision   Vision Assessment?: Vision impaired- to be further tested in functional context Additional Comments: Pt making good eye contact with therapist when oriented in front of her and tracking but not to end ranges of occular ROM   Perception     Praxis      Cognition Arousal: Alert Behavior During Therapy: Flat affect Overall Cognitive Status: Difficult to assess  General Comments: pt with little to no verbalization this session, poor command following but responds well to max multimodal cuing        Exercises Exercises: Other exercises Other Exercises Other Exercises: BUE reaching for functional items 5x    Shoulder Instructions       General Comments afib rhythm    Pertinent Vitals/ Pain       Pain Assessment Pain Assessment: Faces Faces Pain Scale: Hurts little more Pain  Location: generalized Pain Descriptors / Indicators: Guarding Pain Intervention(s): Limited activity within patient's tolerance, Monitored during session  Home Living                                          Prior Functioning/Environment              Frequency  Min 1X/week        Progress Toward Goals  OT Goals(current goals can now be found in the care plan section)  Progress towards OT goals: Progressing toward goals  Acute Rehab OT Goals Patient Stated Goal: unable to state Time For Goal Achievement: 08/10/23 Potential to Achieve Goals: Fair ADL Goals Pt Will Perform Upper Body Bathing: with max assist;sitting Pt Will Perform Lower Body Bathing: with max assist;sitting/lateral leans;sit to/from stand Additional ADL Goal #1: Pt will complete bed mobility at max A +1 level to prepare for EOB/OOB ADLs.  Plan      Co-evaluation                 AM-PAC OT "6 Clicks" Daily Activity     Outcome Measure   Help from another person eating meals?: A Little Help from another person taking care of personal grooming?: A Lot Help from another person toileting, which includes using toliet, bedpan, or urinal?: Total Help from another person bathing (including washing, rinsing, drying)?: Total Help from another person to put on and taking off regular upper body clothing?: A Lot Help from another person to put on and taking off regular lower body clothing?: Total 6 Click Score: 10    End of Session    OT Visit Diagnosis: Unsteadiness on feet (R26.81);History of falling (Z91.81);Muscle weakness (generalized) (M62.81);Other symptoms and signs involving cognitive function;Other symptoms and signs involving the nervous system (R29.898);Cognitive communication deficit (R41.841);Pain   Activity Tolerance Patient tolerated treatment well   Patient Left in bed;with call bell/phone within reach;with bed alarm set;with family/visitor present   Nurse  Communication Mobility status        Time: 3295-1884 OT Time Calculation (min): 18 min  Charges: OT General Charges $OT Visit: 1 Visit OT Treatments $Self Care/Home Management : 8-22 mins  Tyler Deis, OTR/L [facility] Acute Rehabilitation Office: [phone]   Myrla Halsted 08/07/2023, 11:28 AM

## 2023-08-08 DIAGNOSIS — I5023 Acute on chronic systolic (congestive) heart failure: Secondary | ICD-10-CM | POA: Diagnosis not present

## 2023-08-08 DIAGNOSIS — E119 Type 2 diabetes mellitus without complications: Secondary | ICD-10-CM | POA: Diagnosis not present

## 2023-08-08 DIAGNOSIS — I10 Essential (primary) hypertension: Secondary | ICD-10-CM | POA: Diagnosis not present

## 2023-08-08 DIAGNOSIS — G20A1 Parkinson's disease without dyskinesia, without mention of fluctuations: Secondary | ICD-10-CM | POA: Diagnosis not present

## 2023-08-08 LAB — GLUCOSE, CAPILLARY
Glucose-Capillary: 120 mg/dL — ABNORMAL HIGH (ref 70–99)
Glucose-Capillary: 203 mg/dL — ABNORMAL HIGH (ref 70–99)
Glucose-Capillary: 189 mg/dL — ABNORMAL HIGH (ref 70–99)
Glucose-Capillary: 181 mg/dL — ABNORMAL HIGH (ref 70–99)

## 2023-08-08 NOTE — Assessment & Plan Note (Addendum)
Significant physical functional dysfunction, positive frailty. Continue physical therapy as tolerated.   High risk for aspiration. Chest radiograph personally reviewed with no infiltrates. Ruled out pneumonia, plan to hold on antibiotic therapy.

## 2023-08-08 NOTE — Assessment & Plan Note (Addendum)
Echocardiogram with reduced LV systolic function with EF 25 to 30%, global hypokinesis, severe LVH, (myocardium consistent with infiltrative cardiomyopathy) RV with mild reduction in systolic function, LA and RA with mild dilatation, mild to moderate MR,   Plan to continue medical therapy with metoprolol and hydralazine.  Poor prognosis due to poor functional status and frailty.   Acute hypoxemic respiratory failure due to cardiogenic pulmonary edema, left pleural effusion. Sp thoracentesis left 400 cc fluid removed.

## 2023-08-08 NOTE — Assessment & Plan Note (Signed)
Renal function with serum cr at 1.0 with K at 3,9 and serum bicarbonate at 30. Na 137.  Plan to continue close monitoring renal function as outpatient.

## 2023-08-08 NOTE — Progress Notes (Signed)
Progress Note   Patient: Alicia Vang XBJ:[id number] DOB: 05-17-1927 DOA: 07/25/2023     14 DOS: the patient was seen and examined on 08/08/2023   Brief hospital course: Mrs. Olevia Perches was admitted to the hospital with the working diagnosis of heart failure exacerbation.   89 yo female with the past medical history of hypertension, parkinson's disease, bed bound related to cognitive impairment and hip surgery. On the day of admission she was noted gasping for air, prompting her family to call EMS. On her initial physical examination her blood pressure was 162/94, HR 57, RR 33 and 02 saturation 99%, lungs with no wheezing or rales, decreased breath sounds left lower lobe, heart with S1 and S2 present and regular, positive JVD, abdomen soft and not distended, no lower extremity edema.   Chest radiograph with hyperinflation, mild cardiomegaly, bilateral hilar vascular congestion, with bilateral pleural effusions, more left than right.   Assessment and Plan: * Acute on chronic systolic CHF (congestive heart failure) (HCC) Echocardiogram with reduced LV systolic function with EF 25 to 30%, global hypokinesis, severe LVH, (myocardium consistent with infiltrative cardiomyopathy) RV with mild reduction in systolic function, LA and RA with mild dilatation, mild to moderate MR,   Plan to continue medical therapy with metoprolol and hydralazine.  Poor prognosis due to poor functional status and frailty.   Acute hypoxemic respiratory failure due to cardiogenic pulmonary edema, left pleural effusion. Sp thoracentesis left 400 cc fluid removed.   Primary hypertension Continue blood pressure control with metoprolol and hydralazine.   Parkinson's disease (HCC) Significant physical functional dysfunction, positive frailty. Continue physical therapy as tolerated.   High risk for aspiration. Chest radiograph personally reviewed with no infiltrates. Ruled out pneumonia, plan to hold on antibiotic  therapy.   Chronic kidney disease, stage 3a (HCC) Renal function with serum cr at 1.0 with K at 3,9 and serum bicarbonate at 30. Na 137.  Plan to continue close monitoring renal function as outpatient.   Type 2 diabetes mellitus without complication, without long-term current use of insulin (HCC) Continue glucose cover and monitoring with insulin sliding scale.   Protein-calorie malnutrition, severe See by RD on 07-27-2023.  Severe Malnutrition related to chronic illness (CHF, Parkinson's) as evidenced by severe fat depletion, severe muscle depletion    Pressure injury of skin Noted by RN on 07-26-2023 on admission assessment.  Seen by wound care 08-02-2023 upper sacrum with Stage 2 pressure injury, 1X.5X.1cm, pink and dry, lower sacrum with dark red-brown Deep tissue pressure injury, 3X1cm.         Subjective: Patient very deconditioned, not interactive to voice or touch, her daughter at the bedside   Physical Exam: Vitals:   08/08/23 0500 08/08/23 0727 08/08/23 1121 08/08/23 1341  BP:  (!) 145/95 115/86 98/69  Pulse: (!) 51 67 74   Resp: 18 17 20    Temp:  98.3 F (36.8 C) 98.2 F (36.8 C)   TempSrc:  Oral Oral   SpO2: 96% 99% 98%   Weight: 40.8 kg     Height:       Neurology eyes closes, poorly reactive to voice and touch, not responding to questions or following commands ENT with positive pallor Cardiovascular with S1 and S2 present and regular with no gallops, rubs or murmurs Respiratory with no rales or wheezing, no rhonchi Abdomen with no distention  No lower extremity edema  Data Reviewed:    Family Communication: I spoke with patient's daughter at the bedside, we talked in detail about  patient's condition, plan of care and prognosis and all questions were addressed. We talked about patient's high risk of decompensation, and advanced directives.   Disposition: Status is: Inpatient Remains inpatient appropriate because: pending transfer to SNF in am.   Planned Discharge Destination: Skilled nursing facility      Author: Coralie Keens, MD 08/08/2023 4:56 PM  For on call review [email or web address].

## 2023-08-08 NOTE — Evaluation (Signed)
Clinical/Bedside Swallow Evaluation Patient Details  Name: Alicia Vang MRN: [id number] Date of Birth: [birth year]  Today's Date: 08/08/2023 Time: SLP Start Time (ACUTE ONLY): 1147 SLP Stop Time (ACUTE ONLY): 1201 SLP Time Calculation (min) (ACUTE ONLY): 14 min  Past Medical History:  Past Medical History:  Diagnosis Date   Arthritis    Diabetes mellitus without complication (HCC)    Type II   Hypertension    Hypokalemia    Past Surgical History:  Past Surgical History:  Procedure Laterality Date   ABDOMINAL HYSTERECTOMY     CHALAZION EXCISION  11/06/2011   Procedure: MINOR EXCISION OF CHALAZION;  Surgeon: Vita Erm.;  Location: [facility];  Service: Ophthalmology;  Laterality: Left;   CHALAZION EXCISION  02/02/2012   Procedure: MINOR EXCISION OF CHALAZION;  Surgeon: Vita Erm., MD;  Location: [facility];  Service: Ophthalmology;  Laterality: Left;  left eye upper lid   CHALAZION EXCISION Right 03/28/2013   Procedure: MINOR EXCISION OF CHALAZION upper and lower right eye ;  Surgeon: Vita Erm., MD;  Location: [facility];  Service: Ophthalmology;  Laterality: Right;   CHALAZION EXCISION Right 03/28/2013   Procedure: MINOR EXCISION OF CHALAZION UPPER AND LOWER RIGHT EYE  ;  Surgeon: Vita Erm., MD;  Location: [facility] OR;  Service: Ophthalmology;  Laterality: Right;   COLONOSCOPY     HIP PINNING,CANNULATED Right 07/02/2022   Procedure: PERCUTANEOUS SCREW FIXATION OF HIP;  Surgeon: Jones Broom, MD;  Location: [facility] OR;  Service: Orthopedics;  Laterality: Right;   IR THORACENTESIS ASP PLEURAL SPACE W/IMG GUIDE  07/26/2023   TOTAL HIP ARTHROPLASTY Left 10/26/2017   Procedure: TOTAL HIP ARTHROPLASTY ANTERIOR APPROACH;  Surgeon: Gean Birchwood, MD;  Location: [facility] OR;  Service: Orthopedics;  Laterality: Left;   HPI:  This is a 89 year old female who lives at home with her daughter.  Patient has been  bedbound since hip surgery approximately a year ago.  Her past medical history significant for HTN, Parkinson's disease.  Pt presented to the ED with SOB and RLE swelling. CXR remarkable for moderate bilateral pleural effusions with bibasilar atelectasis. Pt seen by SLP this admission and recommended thin and puree and discharged. Pt now coughing with thin liquids at times per dtr. SLP has seen pt twice this admisssion with last eval pt coughing with thin and SLP/dtr agreed to continue thin liquids water, Boost and Ensure only) and continue puree. Pt had choking episode with food yesterday.    Assessment / Plan / Recommendation  Clinical Impression  This is pt's third swallow assessment this admisssion. Previous eval pt coughing with thin liquids and SLP and dtr agreed to allow pt to continue thin liquids with only water, Ensure and Boost and continue puree. Pt had choking episode yesterday with food per daughter. Today she was observed with applesauce with oral holding and delayed transit. Did not observe with water but pt had nectar thick liquids with difficulty initially extracting liquid from straw. SLP pipped in small amount with straw which initiated swallow then pt able to utilize straw. Suspect delayed swallow initiation with s/s aspiration noted today. Recommend continue puree diet, SLP ordered nectar thick liquids and will perform MBS tomorrow and hopefully with pt's contractures she will be able to sit in MBS chair. SLP Visit Diagnosis: Dysphagia, unspecified (R13.10)    Aspiration Risk  Moderate aspiration risk    Diet Recommendation Dysphagia 1 (Puree);Nectar-thick liquid    Liquid  Administration via: Straw;Cup Medication Administration: Crushed with puree Supervision: Staff to assist with self feeding;Full supervision/cueing for compensatory strategies Compensations: Minimize environmental distractions;Small sips/bites;Slow rate Postural Changes: Seated upright at 90 degrees    Other   Recommendations Oral Care Recommendations: Oral care BID    Recommendations for follow up therapy are one component of a multi-disciplinary discharge planning process, led by the attending physician.  Recommendations may be updated based on patient status, additional functional criteria and insurance authorization.  Follow up Recommendations  (TBD)      Assistance Recommended at Discharge    Functional Status Assessment Patient has had a recent decline in their functional status and demonstrates the ability to make significant improvements in function in a reasonable and predictable amount of time.  Frequency and Duration min 2x/week  2 weeks       Prognosis Prognosis for improved oropharyngeal function: Guarded Barriers to Reach Goals: Cognitive deficits      Swallow Study   General HPI: This is a 89 year old female who lives at home with her daughter.  Patient has been bedbound since hip surgery approximately a year ago.  Her past medical history significant for HTN, Parkinson's disease.  Pt presented to the ED with SOB and RLE swelling. CXR remarkable for moderate bilateral pleural effusions with bibasilar atelectasis. Pt seen by SLP this admission and recommended thin and puree and discharged. Pt now coughing with thin liquids at times per dtr. SLP has seen pt twice this admisssion with last eval pt coughing with thin and SLP/dtr agreed to continue thin liquids water, Boost and Ensure only) and continue puree. Pt had choking episode with food yesterday. Type of Study: Bedside Swallow Evaluation Previous Swallow Assessment:  (see HPI) Diet Prior to this Study: Dysphagia 1 (pureed);Thin liquids (Level 0) Temperature Spikes Noted: No Respiratory Status: Room air History of Recent Intubation: No Behavior/Cognition: Alert;Cooperative;Requires cueing Oral Cavity Assessment:  (did  not follow commands) Oral Care Completed by SLP: No Oral Cavity - Dentition: Edentulous Vision: Functional  for self-feeding Self-Feeding Abilities: Total assist Patient Positioning: Upright in bed Baseline Vocal Quality:  (no vocallizations) Volitional Cough: Cognitively unable to elicit Volitional Swallow: Unable to elicit    Oral/Motor/Sensory Function Overall Oral Motor/Sensory Function:  (doesn't follow commands, appears symmetrical)   Ice Chips Ice chips: Not tested   Thin Liquid Thin Liquid: Not tested    Nectar Thick Nectar Thick Liquid: Impaired Presentation: Cup;Straw Oral Phase Impairments: Poor awareness of bolus Oral phase functional implications: Oral holding Pharyngeal Phase Impairments:  (none)   Honey Thick Honey Thick Liquid: Not tested   Puree Puree: Within functional limits   Solid     Solid: Not tested      Royce Macadamia 08/08/2023,12:19 PM

## 2023-08-08 NOTE — Assessment & Plan Note (Signed)
Continue blood pressure control with metoprolol and hydralazine.

## 2023-08-09 ENCOUNTER — Inpatient Hospital Stay (HOSPITAL_COMMUNITY): Payer: Medicare PPO

## 2023-08-09 DIAGNOSIS — I5023 Acute on chronic systolic (congestive) heart failure: Secondary | ICD-10-CM | POA: Diagnosis not present

## 2023-08-09 DIAGNOSIS — I10 Essential (primary) hypertension: Secondary | ICD-10-CM | POA: Diagnosis not present

## 2023-08-09 DIAGNOSIS — N1831 Chronic kidney disease, stage 3a: Secondary | ICD-10-CM | POA: Diagnosis not present

## 2023-08-09 DIAGNOSIS — G20A1 Parkinson's disease without dyskinesia, without mention of fluctuations: Secondary | ICD-10-CM | POA: Diagnosis not present

## 2023-08-09 LAB — GLUCOSE, CAPILLARY
Glucose-Capillary: 166 mg/dL — ABNORMAL HIGH (ref 70–99)
Glucose-Capillary: 161 mg/dL — ABNORMAL HIGH (ref 70–99)
Glucose-Capillary: 120 mg/dL — ABNORMAL HIGH (ref 70–99)

## 2023-08-09 MED ORDER — LOSARTAN POTASSIUM 100 MG PO TABS: 50.0000 mg | ORAL_TABLET | Freq: Every day | ORAL | Status: DC

## 2023-08-09 MED ORDER — SPIRONOLACTONE 12.5 MG HALF TABLET
12.5000 mg | ORAL_TABLET | Freq: Every day | ORAL | Status: DC
  Administered 2023-08-09: 12.5 mg via ORAL
  Filled 2023-08-09: qty 1

## 2023-08-09 MED ORDER — FUROSEMIDE 20 MG PO TABS: 20.0000 mg | ORAL_TABLET | Freq: Every day | ORAL | 0 refills | Status: DC

## 2023-08-09 MED ORDER — FUROSEMIDE 20 MG PO TABS
20.0000 mg | ORAL_TABLET | Freq: Every day | ORAL | Status: DC
  Administered 2023-08-09: 20 mg via ORAL
  Filled 2023-08-09: qty 1

## 2023-08-09 NOTE — Plan of Care (Signed)
Problem: Coping: Goal: Ability to adjust to condition or change in health will improve Outcome: Progressing   Problem: Fluid Volume: Goal: Ability to maintain a balanced intake and output will improve Outcome: Progressing   Problem: Health Behavior/Discharge Planning: Goal: Ability to identify and utilize available resources and services will improve Outcome: Progressing   Problem: Health Behavior/Discharge Planning: Goal: Ability to manage health-related needs will improve Outcome: Progressing   Problem: Metabolic: Goal: Ability to maintain appropriate glucose levels will improve Outcome: Progressing

## 2023-08-09 NOTE — Progress Notes (Addendum)
Speech Language Pathology Treatment: Dysphagia  Patient Details Name: Alicia Vang MRN: [id number] DOB: 03-21-1927 Today's Date: 08/09/2023 Time: 4098-1191 SLP Time Calculation (min) (ACUTE ONLY): 10 min  Assessment / Plan / Recommendation Clinical Impression  SLP reviewed results of MBS with pt's daughter and explained she aspirated during the MBS today with puree and nectar thick (suctioned honey thick and thin from oral cavity due to holding) and risks involved. Daughter stated she understands the risks and stated her mom "loves to eat" and wants to continue for her mom to have food/liquid. Recommending pt continue puree, nectar thick liquids (to decrease discomfort with coughing episodes), crushed meds and pt is allowed to have sips thin water in between meals after oral care. Pt scheduled to be transferring to SNF today. Recommend pt be followed by Palliative care.   HPI HPI: This is a 89 year old female who lives at home with her daughter.  Patient has been bedbound since hip surgery approximately a year ago.  Her past medical history significant for HTN, Parkinson's disease.  Pt presented to the ED with SOB and RLE swelling. CXR remarkable for moderate bilateral pleural effusions with bibasilar atelectasis. Pt seen by SLP this admission and recommended thin and puree and discharged. Pt now coughing with thin liquids at times per dtr. SLP has seen pt twice this admisssion with last eval pt coughing with thin and SLP/dtr agreed to continue thin liquids water, Boost and Ensure only) and continue puree. Pt had choking episode with food yesterday.      SLP Plan  Continue with current plan of care      Recommendations for follow up therapy are one component of a multi-disciplinary discharge planning process, led by the attending physician.  Recommendations may be updated based on patient status, additional functional criteria and insurance authorization.    Recommendations  Diet  recommendations: Dysphagia 1 (puree);Nectar-thick liquid;Other(comment) (thin water protocol) Liquids provided via: Straw;Cup Medication Administration: Crushed with puree Supervision: Staff to assist with self feeding;Full supervision/cueing for compensatory strategies Compensations: Minimize environmental distractions;Small sips/bites;Slow rate Postural Changes and/or Swallow Maneuvers: Seated upright 90 degrees                  Oral care BID   Frequent or constant Supervision/Assistance Dysphagia, oropharyngeal phase (R13.12)     Continue with current plan of care     Royce Macadamia  08/09/2023, 11:32 AM

## 2023-08-09 NOTE — TOC Initial Note (Signed)
Transition of Care [facility]) - Initial/Assessment Note    Patient Details  Name: Alicia Vang MRN: [id number] Date of Birth: Apr 13, 1927  [facility]) CM/SW Contact:    Deatra Robinson, [place] Phone Number: 08/09/2023, 1:41 PM  Clinical Narrative:  pt for dc to [facility] today. Spoke to [place] in admissions who reports they are prepared to admit pt after 4pm to room 604A. Pt's dtr Alicia Vang aware of dc and reports agreeable. RN provided with number for report and PTAR arranged for transport. SW signing off at dc.   Dellie Burns, MSW, LCSW [phone] (coverage)                   Expected Discharge Plan: Skilled Nursing Facility Barriers to Discharge: No Barriers Identified   Patient Goals and CMS Choice Patient states their goals for this hospitalization and ongoing recovery are:: SNF CMS Medicare.gov Compare Post Acute Care list provided to:: Patient Represenative (must comment) Choice offered to / list presented to : Adult Children      Expected Discharge Plan and Services In-house Referral: Clinical Social Work Discharge Planning Services: CM Consult Post Acute Care Choice: NA Living arrangements for the past 2 months: Single Family Home Expected Discharge Date: 08/09/23               DME Arranged: N/A DME Agency: NA       HH Arranged: NA          Prior Living Arrangements/Services Living arrangements for the past 2 months: Single Family Home Lives with:: Adult Children (daughter) Patient language and need for interpreter reviewed:: Yes Do you feel safe going back to the place where you live?: Yes      Need for Family Participation in Patient Care: Yes (Comment) Care giver support system in place?: Yes (comment) Current home services: DME (hospital bed, w/chair, lift, walkers, cane) Criminal Activity/Legal Involvement Pertinent to Current Situation/Hospitalization: No - Comment as needed  Activities of Daily Living   ADL Screening  (condition at time of admission) Independently performs ADLs?: No Does the patient have a NEW difficulty with bathing/dressing/toileting/self-feeding that is expected to last >3 days?: No Does the patient have a NEW difficulty with getting in/out of bed, walking, or climbing stairs that is expected to last >3 days?: No Does the patient have a NEW difficulty with communication that is expected to last >3 days?: Yes (Initiates electronic notice to provider for possible SLP consult) Is the patient deaf or have difficulty hearing?: No Does the patient have difficulty seeing, even when wearing glasses/contacts?: Yes Does the patient have difficulty concentrating, remembering, or making decisions?: Yes  Permission Sought/Granted Permission sought to share information with : Case Manager Permission granted to share information with : Yes, Verbal Permission Granted              Emotional Assessment Appearance:: Appears stated age       Alcohol / Substance Use: Not Applicable Psych Involvement: No (comment)  Admission diagnosis:  Acute congestive heart failure (HCC) [I50.9] Congestive heart failure, unspecified HF chronicity, unspecified heart failure type [facility]) [I50.9] Patient Active Problem List   Diagnosis Date Noted   At risk for deficient intake of food/liquids 08/07/2023   Acute hypoxic respiratory failure (HCC) 08/01/2023   Pressure injury of skin 07/30/2023   Protein-calorie malnutrition, severe 07/28/2023   Acute on chronic systolic CHF (congestive heart failure) (HCC) 07/27/2023   Dilated cardiomyopathy (HCC) 07/27/2023   Pleural effusion on left 07/27/2023   Chronic  kidney disease, stage 3a (HCC) 07/05/2022   Hypoalbuminemia 07/05/2022   Closed displaced fracture of right femoral neck (HCC) 07/01/2022   Traumatic intraventricular hemorrhage (HCC) 07/01/2022   Closed compression fracture of L3 lumbar vertebra, with delayed healing, subsequent encounter 06/01/2021    Parkinson's disease (HCC) 06/01/2021    Class: Question of   Primary hypertension 06/01/2021   COVID-19 virus infection 10/21/2020   Bradycardia 10/21/2020   Type 2 diabetes mellitus without complication, without long-term current use of insulin (HCC) 10/21/2020   Primary osteoarthritis of left hip 10/26/2017   Osteoarthritis of left hip 08/17/2017   PCP:  Knox Royalty, MD Pharmacy:   [facility] [phone] Ginette Otto, [place] - [address] [place] [address] Phone: [phone] Fax: [phone]     Social Determinants of Health (SDOH) Social History: SDOH Screenings   Food Insecurity: No Food Insecurity (07/26/2023)  Housing: Low Risk  (07/26/2023)  Transportation Needs: No Transportation Needs (07/26/2023)  Utilities: Not At Risk (07/26/2023)  Tobacco Use: Low Risk  (07/25/2023)   SDOH Interventions:     Readmission Risk Interventions     No data to display

## 2023-08-09 NOTE — Progress Notes (Signed)
Modified Barium Swallow Study  Patient Details  Name: Alicia Vang MRN: [id number] Date of Birth: 02/01/1927  Today's Date: 08/09/2023  Modified Barium Swallow completed.  Full report located under Chart Review in the Imaging Section.  History of Present Illness This is a 89 year old female who lives at home with her daughter.  Patient has been bedbound since hip surgery approximately a year ago.  Her past medical history significant for HTN, Parkinson's disease.  Pt presented to the ED with SOB and RLE swelling. CXR remarkable for moderate bilateral pleural effusions with bibasilar atelectasis. Pt seen by SLP this admission and recommended thin and puree and discharged. Pt now coughing with thin liquids at times per dtr. SLP has seen pt twice this admisssion with last eval pt coughing with thin and SLP/dtr agreed to continue thin liquids water, Boost and Ensure only) and continue puree. Pt had choking episode with food yesterday.   Clinical Impression Pt demonstrates severe oropharyngeal dysphagia marked by oral holding with boluses requiring oral suctioning frequently. Pt only able to initiate a swallow several times. Pt exhibits decreased timing of swallow and puree fell to pyriform sinuses with gross aspiration during the swallow followed by immedicate reflexive cough that did not clear her airway. Nectar thick liquids penetrated laryngeal vestibule before swallow initiated then aspirated during the swallow with cough. The strength of her swallow is somewhat reduced but main impairment is the timing of swallow initiation. She was unable to tranist honey thick and was suctioned. Pt could not express liquid from straw or initiate a swallow when pipped in with end of straw and was orally suctioned. She was unable to follow commands for compensatory strategies. Pt is not currently safe with any po's however long term alternate means of feeding in this pt with dementia has significant risk.  Discussions with daughter re: nutrition status need to be had. SLP educate pt on results and recommendations of MBS and decide if daughter wants to do comfort feeds with puree and thick liquids (to decrease coughing with thin liquids) allowing thin water in between. Factors that may increase risk of adverse event in presence of aspiration [employer] 2021): Frail or deconditioned  Swallow Evaluation Recommendations Recommendations: NPO (unless dtr wants to do comfort feeds) Oral care recommendations: Oral care BID (2x/day)      Royce Macadamia 08/09/2023,9:18 AM

## 2023-08-09 NOTE — Care Management Important Message (Signed)
Important Message  Patient Details  Name: Alicia Vang MRN: [id number] Date of Birth: [birth year]   Important Message Given:  Yes - Medicare IM     Dorena Bodo 08/09/2023, 3:16 PM

## 2023-08-09 NOTE — Discharge Summary (Addendum)
POP      Full           Yes      No                                  +---------+---------------+---------+-----------+----------+--------------+ PTV      Full                                                        +---------+---------------+---------+-----------+----------+--------------+ PERO     Full                                                         +---------+---------------+---------+-----------+----------+--------------+   +----+---------------+---------+-----------+----------+--------------+ LEFTCompressibilityPhasicitySpontaneityPropertiesThrombus Aging +----+---------------+---------+-----------+----------+--------------+ CFV Full           Yes      No                                  +----+---------------+---------+-----------+----------+--------------+    Summary: RIGHT: - There is no evidence of deep vein thrombosis in the lower extremity.  - No cystic structure found in the popliteal fossa.  LEFT: - No evidence of common femoral vein obstruction.   *See table(s) above for measurements and observations. Electronically signed by Heath Lark on 07/27/2023 at 6:48:03 PM.    Final    ECHOCARDIOGRAM COMPLETE  Result Date: 07/26/2023    ECHOCARDIOGRAM REPORT   Patient Name:   Alicia Vang Date of Exam: 07/26/2023 Medical Rec #:  [id number]           Height:       66.0 in Accession #:    [id number]          Weight:       89.5 lb Date of Birth:  [birth year]          BSA:          1.421 m Patient Age:    89 years            BP:           163/83 mmHg Patient Gender: F                   HR:           54 bpm. Exam Location:  Inpatient Procedure: 2D Echo, Cardiac Doppler and Color Doppler Indications:    CHF  History:        Patient has no prior history of Echocardiogram examinations.                 Arrythmias:Atrial Fibrillation, Signs/Symptoms:Edema and                 Shortness of Breath; Risk Factors:Hypertension and Diabetes.                 Parkinson's.  Sonographer:    Milda Smart Referring Phys: [phone] DEBBY CROSLEY  Sonographer Comments: Image acquisition challenging due to patient body habitus and Image acquisition challenging due to respiratory motion. IMPRESSIONS  1. Left ventricular  POP      Full           Yes      No                                  +---------+---------------+---------+-----------+----------+--------------+ PTV      Full                                                        +---------+---------------+---------+-----------+----------+--------------+ PERO     Full                                                         +---------+---------------+---------+-----------+----------+--------------+   +----+---------------+---------+-----------+----------+--------------+ LEFTCompressibilityPhasicitySpontaneityPropertiesThrombus Aging +----+---------------+---------+-----------+----------+--------------+ CFV Full           Yes      No                                  +----+---------------+---------+-----------+----------+--------------+    Summary: RIGHT: - There is no evidence of deep vein thrombosis in the lower extremity.  - No cystic structure found in the popliteal fossa.  LEFT: - No evidence of common femoral vein obstruction.   *See table(s) above for measurements and observations. Electronically signed by Heath Lark on 07/27/2023 at 6:48:03 PM.    Final    ECHOCARDIOGRAM COMPLETE  Result Date: 07/26/2023    ECHOCARDIOGRAM REPORT   Patient Name:   Alicia Vang Date of Exam: 07/26/2023 Medical Rec #:  [id number]           Height:       66.0 in Accession #:    [id number]          Weight:       89.5 lb Date of Birth:  [birth year]          BSA:          1.421 m Patient Age:    89 years            BP:           163/83 mmHg Patient Gender: F                   HR:           54 bpm. Exam Location:  Inpatient Procedure: 2D Echo, Cardiac Doppler and Color Doppler Indications:    CHF  History:        Patient has no prior history of Echocardiogram examinations.                 Arrythmias:Atrial Fibrillation, Signs/Symptoms:Edema and                 Shortness of Breath; Risk Factors:Hypertension and Diabetes.                 Parkinson's.  Sonographer:    Milda Smart Referring Phys: [phone] DEBBY CROSLEY  Sonographer Comments: Image acquisition challenging due to patient body habitus and Image acquisition challenging due to respiratory motion. IMPRESSIONS  1. Left ventricular  2024 FINDINGS: Cardiac and mediastinal contours are within normal limits. Lungs are clear. Interval resolution of bilateral pleural effusions. No evidence of pneumothorax. Old left-sided rib fractures. IMPRESSION: No consolidation. Interval resolution of bilateral pleural effusions. Electronically Signed   By: Allegra Lai M.D.   On: 08/07/2023 18:30   VAS [place] LOWER EXTREMITY VENOUS (DVT) (7a-7p)  Result Date: 07/27/2023  Lower Venous DVT Study Patient Name:  Alicia Vang  Date of Exam:   07/25/2023 Medical Rec #: [id number]            Accession #:    [id number] Date of Birth: July 15, 1927           Patient Gender: F Patient Age:   76 years Exam Location:  [facility] Procedure:      VAS [place] LOWER EXTREMITY VENOUS (DVT) Referring Phys: Griffin Basil ROEMHILDT --------------------------------------------------------------------------------  Indications: Swelling.  Risk Factors: None identified. Limitations: Poor ultrasound/tissue interface and patient positioning. Comparison Study: No prior studies. Performing Technologist: Chanda Busing RVT   Examination Guidelines: A complete evaluation includes B-mode imaging, spectral Doppler, color Doppler, and power Doppler as needed of all accessible portions of each vessel. Bilateral testing is considered an integral part of a complete examination. Limited examinations for reoccurring indications may be performed as noted. The reflux portion of the exam is performed with the patient in reverse Trendelenburg.  +---------+---------------+---------+-----------+----------+--------------+ RIGHT    CompressibilityPhasicitySpontaneityPropertiesThrombus Aging +---------+---------------+---------+-----------+----------+--------------+ CFV      Full           Yes      No                                  +---------+---------------+---------+-----------+----------+--------------+ SFJ      Full                                                        +---------+---------------+---------+-----------+----------+--------------+ FV Prox  Full                                                        +---------+---------------+---------+-----------+----------+--------------+ FV Mid   Full                                                        +---------+---------------+---------+-----------+----------+--------------+ FV DistalFull                                                        +---------+---------------+---------+-----------+----------+--------------+ PFV      Full                                                        +---------+---------------+---------+-----------+----------+--------------+  20 MG tablet Commonly known as: LASIX Take 1 tablet (20 mg total) by mouth daily.   latanoprost 0.005 % ophthalmic solution Commonly known as: XALATAN Place 1 drop into both eyes at bedtime.   losartan 100 MG tablet Commonly known as: COZAAR Take 0.5 tablets (50 mg total) by mouth daily. What changed: how much to take   polyethylene glycol 17 g packet Commonly known as: MIRALAX / GLYCOLAX Take 17 g by mouth daily as needed for mild constipation.   spironolactone 25 MG tablet Commonly known as: ALDACTONE Take 0.5 tablets (12.5 mg total) by mouth daily.   Vitamin D (Ergocalciferol) 1.25 MG (50000 UNIT) Caps capsule Commonly known as:  DRISDOL Take 50,000 Units by mouth once a week.   Xiidra 5 % Soln Generic drug: Lifitegrast Place 1 drop into both eyes at bedtime.               Discharge Care Instructions  (From admission, onward)           Start     Ordered   08/09/23 0000  Discharge wound care:       Comments: Daily      Comments: Cleanse sacrum/coccyx with soap and water, apply Xeroform gauze [place] [phone]) to wound beds daily.  Cover with silicone foam. May lift foam daily to replace Xeroform.  Change foam q3 days and prn soiling   08/09/23 1249   07/31/23 0000  Discharge wound care:       Comments: As per wound care RN's recommendations   07/31/23 1051            Contact information for follow-up providers     Knox Royalty, MD. Schedule an appointment as soon as possible for a visit in 1 week(s).   Specialty: Family Medicine Why: with repeat cbc/bmp Contact information: [address] [place] [address] [phone]              Contact information for after-discharge care     Destination     [facility] .   Service: Skilled Nursing Contact information: [address] Test Update Address [place] [address] [phone]                    Discharge Exam: Ceasar Mons Weights   08/07/23 0144 08/08/23 0500 08/09/23 0305  Weight: 40.4 kg 40.8 kg 39.8 kg   BP (!) 138/114   Pulse (!) 229   Temp 98 F (36.7 C)   Resp 17   Ht 5\' 6"  (1.676 m)   Wt 39.8 kg   SpO2 (!) 61%   BMI 14.16 kg/m   Patient with no dyspnea or chest pain, she is sleeping and her daughter is at the bedside.   Neurology somnolent  ENT with mild pallor Cardiovascular with S1 and S2 present and regular, bradycardic with no gallops, or rubs Respiratory with no rales or wheezing, no rhonchi Abdomen with no distention  No lower extremity edema   Condition at discharge: stable  The results of significant diagnostics from this hospitalization (including  imaging, microbiology, ancillary and laboratory) are listed below for reference.   Imaging Studies: DG Swallowing Func-Speech Pathology  Result Date: 08/09/2023 Table formatting from the original result was not included. Modified Barium Swallow Study Patient Details Name: Alicia Vang MRN: [id number] Date of Birth: 24-Oct-1926 Today's Date: 08/09/2023 HPI/PMH: HPI: This is a 89 year old female who lives at home with her daughter.  Patient has been bedbound since hip  Physician Discharge Summary   Patient: Alicia Vang MRN: [id number] DOB: 1927-02-14  Admit date:     07/25/2023  Discharge date: 08/09/23  Discharge Physician: York Ram Sueanne Maniaci   PCP: Knox Royalty, MD   Recommendations at discharge:    Discontinue metoprolol due to bradycardia. Decreased losartan to 50 mg po daily to avoid hypotension, added spironolactone and furosemide.  Discontinue glimepiride to avoid hypoglycemia.  Follow up renal function and electrolytes in 7 days. Follow up with Dr Yetta Barre in 7 to 10 days.  Aspiration precautions, dysphagia 1 diet, puree and honey thick liquids. Code status changed to DNR, follow up palliative care as outpatient.   I spoke with patient's daughter at the bedside, we talked in detail about patient's condition, plan of care and prognosis and all questions were addressed. Considering high risk for aspiration, frailty, parkinson's disease, dementia and advance cardiomyopathy, consistent in very poor prognosis, her family has decided to change code status to DNR and follow up with palliative care as outpatient.    Discharge Diagnoses: Principal Problem:   Acute on chronic systolic CHF (congestive heart failure) (HCC) Active Problems:   Primary hypertension   Parkinson's disease (HCC)   Chronic kidney disease, stage 3a (HCC)   Type 2 diabetes mellitus without complication, without long-term current use of insulin (HCC)   Protein-calorie malnutrition, severe   Pressure injury of skin  Resolved Problems:   * No resolved hospital problems. [facility] Course: Mrs. Olevia Perches was admitted to the hospital with the working diagnosis of heart failure exacerbation.   89 yo female with the past medical history of hypertension, parkinson's disease, bed bound related to cognitive impairment and hip surgery. On the day of admission she was noted gasping for air, prompting her family to call EMS. On her initial physical examination her blood  pressure was 162/94, HR 57, RR 33 and 02 saturation 99%, lungs with no wheezing or rales, decreased breath sounds left lower lobe, heart with S1 and S2 present and regular, positive JVD, abdomen soft and not distended, no lower extremity edema.   Na 136, K 3,1 Cl 103 bicarbonate 25, glucose 255, bun 19 cr 1,0  Mag 2,0  AST 31, ALT 45  Wbc 5,2 hgb 11,2 plt 240  Sars covid 19 negative   Chest radiograph with hyperinflation, mild cardiomegaly, bilateral hilar vascular congestion, with bilateral pleural effusions, more left than right.   EKG 59 bpm, left axis deviation, left bundle branch block, sinus rhythm with poor R R wave progression, no significant ST segment or T wave changes. Positive LVH.   Patient was placed on furosemide for diuresis.  Right sided thoracentesis.  Noted to have severe swallow dysfunction.  10/17 clinically stable for discharge to SNF.  Code status has been change to DNR, and continue outpatient palliative care.   Assessment and Plan: * Acute on chronic systolic CHF (congestive heart failure) (HCC) Echocardiogram with reduced LV systolic function with EF 25 to 30%, global hypokinesis, severe LVH, (myocardium consistent with infiltrative cardiomyopathy) RV with mild reduction in systolic function, LA and RA with mild dilatation, mild to moderate MR,   Patient was placed on IV furosemide for diuresis, negative fluid balance was achieved, -10,974 ml, with significant improvement in her symptoms.   Plan to continue medical therapy with spironolactone and losartan.  Hold on SGLT 2 inh, patient with poor oral intake and poor prognosis.  Considering bradycardia, will hold on metoprolol.  Diuresis with furosemide 20 mg po daily.  Poor  2024 FINDINGS: Cardiac and mediastinal contours are within normal limits. Lungs are clear. Interval resolution of bilateral pleural effusions. No evidence of pneumothorax. Old left-sided rib fractures. IMPRESSION: No consolidation. Interval resolution of bilateral pleural effusions. Electronically Signed   By: Allegra Lai M.D.   On: 08/07/2023 18:30   VAS [place] LOWER EXTREMITY VENOUS (DVT) (7a-7p)  Result Date: 07/27/2023  Lower Venous DVT Study Patient Name:  Alicia Vang  Date of Exam:   07/25/2023 Medical Rec #: [id number]            Accession #:    [id number] Date of Birth: July 15, 1927           Patient Gender: F Patient Age:   76 years Exam Location:  [facility] Procedure:      VAS [place] LOWER EXTREMITY VENOUS (DVT) Referring Phys: Griffin Basil ROEMHILDT --------------------------------------------------------------------------------  Indications: Swelling.  Risk Factors: None identified. Limitations: Poor ultrasound/tissue interface and patient positioning. Comparison Study: No prior studies. Performing Technologist: Chanda Busing RVT   Examination Guidelines: A complete evaluation includes B-mode imaging, spectral Doppler, color Doppler, and power Doppler as needed of all accessible portions of each vessel. Bilateral testing is considered an integral part of a complete examination. Limited examinations for reoccurring indications may be performed as noted. The reflux portion of the exam is performed with the patient in reverse Trendelenburg.  +---------+---------------+---------+-----------+----------+--------------+ RIGHT    CompressibilityPhasicitySpontaneityPropertiesThrombus Aging +---------+---------------+---------+-----------+----------+--------------+ CFV      Full           Yes      No                                  +---------+---------------+---------+-----------+----------+--------------+ SFJ      Full                                                        +---------+---------------+---------+-----------+----------+--------------+ FV Prox  Full                                                        +---------+---------------+---------+-----------+----------+--------------+ FV Mid   Full                                                        +---------+---------------+---------+-----------+----------+--------------+ FV DistalFull                                                        +---------+---------------+---------+-----------+----------+--------------+ PFV      Full                                                        +---------+---------------+---------+-----------+----------+--------------+  POP      Full           Yes      No                                  +---------+---------------+---------+-----------+----------+--------------+ PTV      Full                                                        +---------+---------------+---------+-----------+----------+--------------+ PERO     Full                                                         +---------+---------------+---------+-----------+----------+--------------+   +----+---------------+---------+-----------+----------+--------------+ LEFTCompressibilityPhasicitySpontaneityPropertiesThrombus Aging +----+---------------+---------+-----------+----------+--------------+ CFV Full           Yes      No                                  +----+---------------+---------+-----------+----------+--------------+    Summary: RIGHT: - There is no evidence of deep vein thrombosis in the lower extremity.  - No cystic structure found in the popliteal fossa.  LEFT: - No evidence of common femoral vein obstruction.   *See table(s) above for measurements and observations. Electronically signed by Heath Lark on 07/27/2023 at 6:48:03 PM.    Final    ECHOCARDIOGRAM COMPLETE  Result Date: 07/26/2023    ECHOCARDIOGRAM REPORT   Patient Name:   Alicia Vang Date of Exam: 07/26/2023 Medical Rec #:  [id number]           Height:       66.0 in Accession #:    [id number]          Weight:       89.5 lb Date of Birth:  [birth year]          BSA:          1.421 m Patient Age:    89 years            BP:           163/83 mmHg Patient Gender: F                   HR:           54 bpm. Exam Location:  Inpatient Procedure: 2D Echo, Cardiac Doppler and Color Doppler Indications:    CHF  History:        Patient has no prior history of Echocardiogram examinations.                 Arrythmias:Atrial Fibrillation, Signs/Symptoms:Edema and                 Shortness of Breath; Risk Factors:Hypertension and Diabetes.                 Parkinson's.  Sonographer:    Milda Smart Referring Phys: [phone] DEBBY CROSLEY  Sonographer Comments: Image acquisition challenging due to patient body habitus and Image acquisition challenging due to respiratory motion. IMPRESSIONS  1. Left ventricular  POP      Full           Yes      No                                  +---------+---------------+---------+-----------+----------+--------------+ PTV      Full                                                        +---------+---------------+---------+-----------+----------+--------------+ PERO     Full                                                         +---------+---------------+---------+-----------+----------+--------------+   +----+---------------+---------+-----------+----------+--------------+ LEFTCompressibilityPhasicitySpontaneityPropertiesThrombus Aging +----+---------------+---------+-----------+----------+--------------+ CFV Full           Yes      No                                  +----+---------------+---------+-----------+----------+--------------+    Summary: RIGHT: - There is no evidence of deep vein thrombosis in the lower extremity.  - No cystic structure found in the popliteal fossa.  LEFT: - No evidence of common femoral vein obstruction.   *See table(s) above for measurements and observations. Electronically signed by Heath Lark on 07/27/2023 at 6:48:03 PM.    Final    ECHOCARDIOGRAM COMPLETE  Result Date: 07/26/2023    ECHOCARDIOGRAM REPORT   Patient Name:   Alicia Vang Date of Exam: 07/26/2023 Medical Rec #:  [id number]           Height:       66.0 in Accession #:    [id number]          Weight:       89.5 lb Date of Birth:  [birth year]          BSA:          1.421 m Patient Age:    89 years            BP:           163/83 mmHg Patient Gender: F                   HR:           54 bpm. Exam Location:  Inpatient Procedure: 2D Echo, Cardiac Doppler and Color Doppler Indications:    CHF  History:        Patient has no prior history of Echocardiogram examinations.                 Arrythmias:Atrial Fibrillation, Signs/Symptoms:Edema and                 Shortness of Breath; Risk Factors:Hypertension and Diabetes.                 Parkinson's.  Sonographer:    Milda Smart Referring Phys: [phone] DEBBY CROSLEY  Sonographer Comments: Image acquisition challenging due to patient body habitus and Image acquisition challenging due to respiratory motion. IMPRESSIONS  1. Left ventricular  20 MG tablet Commonly known as: LASIX Take 1 tablet (20 mg total) by mouth daily.   latanoprost 0.005 % ophthalmic solution Commonly known as: XALATAN Place 1 drop into both eyes at bedtime.   losartan 100 MG tablet Commonly known as: COZAAR Take 0.5 tablets (50 mg total) by mouth daily. What changed: how much to take   polyethylene glycol 17 g packet Commonly known as: MIRALAX / GLYCOLAX Take 17 g by mouth daily as needed for mild constipation.   spironolactone 25 MG tablet Commonly known as: ALDACTONE Take 0.5 tablets (12.5 mg total) by mouth daily.   Vitamin D (Ergocalciferol) 1.25 MG (50000 UNIT) Caps capsule Commonly known as:  DRISDOL Take 50,000 Units by mouth once a week.   Xiidra 5 % Soln Generic drug: Lifitegrast Place 1 drop into both eyes at bedtime.               Discharge Care Instructions  (From admission, onward)           Start     Ordered   08/09/23 0000  Discharge wound care:       Comments: Daily      Comments: Cleanse sacrum/coccyx with soap and water, apply Xeroform gauze [place] [phone]) to wound beds daily.  Cover with silicone foam. May lift foam daily to replace Xeroform.  Change foam q3 days and prn soiling   08/09/23 1249   07/31/23 0000  Discharge wound care:       Comments: As per wound care RN's recommendations   07/31/23 1051            Contact information for follow-up providers     Knox Royalty, MD. Schedule an appointment as soon as possible for a visit in 1 week(s).   Specialty: Family Medicine Why: with repeat cbc/bmp Contact information: [address] [place] [address] [phone]              Contact information for after-discharge care     Destination     [facility] .   Service: Skilled Nursing Contact information: [address] Test Update Address [place] [address] [phone]                    Discharge Exam: Ceasar Mons Weights   08/07/23 0144 08/08/23 0500 08/09/23 0305  Weight: 40.4 kg 40.8 kg 39.8 kg   BP (!) 138/114   Pulse (!) 229   Temp 98 F (36.7 C)   Resp 17   Ht 5\' 6"  (1.676 m)   Wt 39.8 kg   SpO2 (!) 61%   BMI 14.16 kg/m   Patient with no dyspnea or chest pain, she is sleeping and her daughter is at the bedside.   Neurology somnolent  ENT with mild pallor Cardiovascular with S1 and S2 present and regular, bradycardic with no gallops, or rubs Respiratory with no rales or wheezing, no rhonchi Abdomen with no distention  No lower extremity edema   Condition at discharge: stable  The results of significant diagnostics from this hospitalization (including  imaging, microbiology, ancillary and laboratory) are listed below for reference.   Imaging Studies: DG Swallowing Func-Speech Pathology  Result Date: 08/09/2023 Table formatting from the original result was not included. Modified Barium Swallow Study Patient Details Name: Alicia Vang MRN: [id number] Date of Birth: 24-Oct-1926 Today's Date: 08/09/2023 HPI/PMH: HPI: This is a 89 year old female who lives at home with her daughter.  Patient has been bedbound since hip  20 MG tablet Commonly known as: LASIX Take 1 tablet (20 mg total) by mouth daily.   latanoprost 0.005 % ophthalmic solution Commonly known as: XALATAN Place 1 drop into both eyes at bedtime.   losartan 100 MG tablet Commonly known as: COZAAR Take 0.5 tablets (50 mg total) by mouth daily. What changed: how much to take   polyethylene glycol 17 g packet Commonly known as: MIRALAX / GLYCOLAX Take 17 g by mouth daily as needed for mild constipation.   spironolactone 25 MG tablet Commonly known as: ALDACTONE Take 0.5 tablets (12.5 mg total) by mouth daily.   Vitamin D (Ergocalciferol) 1.25 MG (50000 UNIT) Caps capsule Commonly known as:  DRISDOL Take 50,000 Units by mouth once a week.   Xiidra 5 % Soln Generic drug: Lifitegrast Place 1 drop into both eyes at bedtime.               Discharge Care Instructions  (From admission, onward)           Start     Ordered   08/09/23 0000  Discharge wound care:       Comments: Daily      Comments: Cleanse sacrum/coccyx with soap and water, apply Xeroform gauze [place] [phone]) to wound beds daily.  Cover with silicone foam. May lift foam daily to replace Xeroform.  Change foam q3 days and prn soiling   08/09/23 1249   07/31/23 0000  Discharge wound care:       Comments: As per wound care RN's recommendations   07/31/23 1051            Contact information for follow-up providers     Knox Royalty, MD. Schedule an appointment as soon as possible for a visit in 1 week(s).   Specialty: Family Medicine Why: with repeat cbc/bmp Contact information: [address] [place] [address] [phone]              Contact information for after-discharge care     Destination     [facility] .   Service: Skilled Nursing Contact information: [address] Test Update Address [place] [address] [phone]                    Discharge Exam: Ceasar Mons Weights   08/07/23 0144 08/08/23 0500 08/09/23 0305  Weight: 40.4 kg 40.8 kg 39.8 kg   BP (!) 138/114   Pulse (!) 229   Temp 98 F (36.7 C)   Resp 17   Ht 5\' 6"  (1.676 m)   Wt 39.8 kg   SpO2 (!) 61%   BMI 14.16 kg/m   Patient with no dyspnea or chest pain, she is sleeping and her daughter is at the bedside.   Neurology somnolent  ENT with mild pallor Cardiovascular with S1 and S2 present and regular, bradycardic with no gallops, or rubs Respiratory with no rales or wheezing, no rhonchi Abdomen with no distention  No lower extremity edema   Condition at discharge: stable  The results of significant diagnostics from this hospitalization (including  imaging, microbiology, ancillary and laboratory) are listed below for reference.   Imaging Studies: DG Swallowing Func-Speech Pathology  Result Date: 08/09/2023 Table formatting from the original result was not included. Modified Barium Swallow Study Patient Details Name: Alicia Vang MRN: [id number] Date of Birth: 24-Oct-1926 Today's Date: 08/09/2023 HPI/PMH: HPI: This is a 89 year old female who lives at home with her daughter.  Patient has been bedbound since hip

## 2023-08-11 NOTE — Plan of Care (Signed)
[facility] Tonsillectomy/Adenoidectomy, Postoperative PEDS care plan entered in error.

## 2023-08-16 DIAGNOSIS — I10 Essential (primary) hypertension: Secondary | ICD-10-CM | POA: Diagnosis not present

## 2023-08-16 DIAGNOSIS — I5023 Acute on chronic systolic (congestive) heart failure: Secondary | ICD-10-CM | POA: Diagnosis not present

## 2023-08-16 DIAGNOSIS — E43 Unspecified severe protein-calorie malnutrition: Secondary | ICD-10-CM | POA: Diagnosis not present

## 2023-08-16 DIAGNOSIS — N1831 Chronic kidney disease, stage 3a: Secondary | ICD-10-CM

## 2023-08-16 DIAGNOSIS — G20A1 Parkinson's disease without dyskinesia, without mention of fluctuations: Secondary | ICD-10-CM | POA: Diagnosis not present

## 2023-08-22 ENCOUNTER — Inpatient Hospital Stay (HOSPITAL_COMMUNITY)
Admission: EM | Admit: 2023-08-22 | Discharge: 2023-09-23 | DRG: 177 | Disposition: E | Payer: Medicare PPO | Source: Skilled Nursing Facility | Attending: Student | Admitting: Student

## 2023-08-22 ENCOUNTER — Emergency Department (HOSPITAL_COMMUNITY): Payer: Medicare PPO

## 2023-08-22 DIAGNOSIS — N1832 Chronic kidney disease, stage 3b: Secondary | ICD-10-CM | POA: Diagnosis present

## 2023-08-22 DIAGNOSIS — E1122 Type 2 diabetes mellitus with diabetic chronic kidney disease: Secondary | ICD-10-CM | POA: Diagnosis present

## 2023-08-22 DIAGNOSIS — I10 Essential (primary) hypertension: Secondary | ICD-10-CM | POA: Diagnosis not present

## 2023-08-22 DIAGNOSIS — E872 Acidosis, unspecified: Secondary | ICD-10-CM | POA: Diagnosis present

## 2023-08-22 DIAGNOSIS — I5022 Chronic systolic (congestive) heart failure: Secondary | ICD-10-CM | POA: Diagnosis present

## 2023-08-22 DIAGNOSIS — R64 Cachexia: Secondary | ICD-10-CM | POA: Diagnosis present

## 2023-08-22 DIAGNOSIS — R627 Adult failure to thrive: Secondary | ICD-10-CM | POA: Diagnosis present

## 2023-08-22 DIAGNOSIS — J690 Pneumonitis due to inhalation of food and vomit: Secondary | ICD-10-CM | POA: Diagnosis present

## 2023-08-22 DIAGNOSIS — Z8616 Personal history of COVID-19: Secondary | ICD-10-CM

## 2023-08-22 DIAGNOSIS — R4589 Other symptoms and signs involving emotional state: Secondary | ICD-10-CM

## 2023-08-22 DIAGNOSIS — I491 Atrial premature depolarization: Secondary | ICD-10-CM | POA: Diagnosis present

## 2023-08-22 DIAGNOSIS — Z515 Encounter for palliative care: Secondary | ICD-10-CM

## 2023-08-22 DIAGNOSIS — Z823 Family history of stroke: Secondary | ICD-10-CM

## 2023-08-22 DIAGNOSIS — G9341 Metabolic encephalopathy: Secondary | ICD-10-CM | POA: Diagnosis present

## 2023-08-22 DIAGNOSIS — Z7401 Bed confinement status: Secondary | ICD-10-CM

## 2023-08-22 DIAGNOSIS — A419 Sepsis, unspecified organism: Secondary | ICD-10-CM

## 2023-08-22 DIAGNOSIS — R1310 Dysphagia, unspecified: Secondary | ICD-10-CM | POA: Diagnosis present

## 2023-08-22 DIAGNOSIS — J189 Pneumonia, unspecified organism: Secondary | ICD-10-CM | POA: Diagnosis not present

## 2023-08-22 DIAGNOSIS — R531 Weakness: Secondary | ICD-10-CM | POA: Diagnosis present

## 2023-08-22 DIAGNOSIS — Z888 Allergy status to other drugs, medicaments and biological substances status: Secondary | ICD-10-CM

## 2023-08-22 DIAGNOSIS — Z9889 Other specified postprocedural states: Secondary | ICD-10-CM

## 2023-08-22 DIAGNOSIS — L89512 Pressure ulcer of right ankle, stage 2: Secondary | ICD-10-CM | POA: Diagnosis present

## 2023-08-22 DIAGNOSIS — Z8701 Personal history of pneumonia (recurrent): Secondary | ICD-10-CM

## 2023-08-22 DIAGNOSIS — E43 Unspecified severe protein-calorie malnutrition: Secondary | ICD-10-CM | POA: Diagnosis present

## 2023-08-22 DIAGNOSIS — I130 Hypertensive heart and chronic kidney disease with heart failure and stage 1 through stage 4 chronic kidney disease, or unspecified chronic kidney disease: Secondary | ICD-10-CM | POA: Diagnosis present

## 2023-08-22 DIAGNOSIS — I9589 Other hypotension: Secondary | ICD-10-CM | POA: Diagnosis present

## 2023-08-22 DIAGNOSIS — L89152 Pressure ulcer of sacral region, stage 2: Secondary | ICD-10-CM | POA: Diagnosis present

## 2023-08-22 DIAGNOSIS — N1831 Chronic kidney disease, stage 3a: Secondary | ICD-10-CM | POA: Diagnosis present

## 2023-08-22 DIAGNOSIS — Z90710 Acquired absence of both cervix and uterus: Secondary | ICD-10-CM

## 2023-08-22 DIAGNOSIS — T17908A Unspecified foreign body in respiratory tract, part unspecified causing other injury, initial encounter: Secondary | ICD-10-CM

## 2023-08-22 DIAGNOSIS — I5042 Chronic combined systolic (congestive) and diastolic (congestive) heart failure: Secondary | ICD-10-CM | POA: Diagnosis present

## 2023-08-22 DIAGNOSIS — Z7189 Other specified counseling: Secondary | ICD-10-CM

## 2023-08-22 DIAGNOSIS — E870 Hyperosmolality and hypernatremia: Secondary | ICD-10-CM | POA: Diagnosis present

## 2023-08-22 DIAGNOSIS — Z96642 Presence of left artificial hip joint: Secondary | ICD-10-CM | POA: Diagnosis present

## 2023-08-22 DIAGNOSIS — E861 Hypovolemia: Secondary | ICD-10-CM | POA: Diagnosis present

## 2023-08-22 DIAGNOSIS — T17908S Unspecified foreign body in respiratory tract, part unspecified causing other injury, sequela: Secondary | ICD-10-CM | POA: Diagnosis not present

## 2023-08-22 DIAGNOSIS — Z8249 Family history of ischemic heart disease and other diseases of the circulatory system: Secondary | ICD-10-CM

## 2023-08-22 DIAGNOSIS — E1165 Type 2 diabetes mellitus with hyperglycemia: Secondary | ICD-10-CM | POA: Diagnosis present

## 2023-08-22 DIAGNOSIS — G20A1 Parkinson's disease without dyskinesia, without mention of fluctuations: Secondary | ICD-10-CM | POA: Diagnosis present

## 2023-08-22 DIAGNOSIS — E860 Dehydration: Secondary | ICD-10-CM | POA: Diagnosis present

## 2023-08-22 DIAGNOSIS — F02C0 Dementia in other diseases classified elsewhere, severe, without behavioral disturbance, psychotic disturbance, mood disturbance, and anxiety: Secondary | ICD-10-CM | POA: Diagnosis present

## 2023-08-22 DIAGNOSIS — E876 Hypokalemia: Secondary | ICD-10-CM | POA: Diagnosis present

## 2023-08-22 DIAGNOSIS — Z7901 Long term (current) use of anticoagulants: Secondary | ICD-10-CM

## 2023-08-22 DIAGNOSIS — Z681 Body mass index (BMI) 19 or less, adult: Secondary | ICD-10-CM

## 2023-08-22 DIAGNOSIS — I441 Atrioventricular block, second degree: Secondary | ICD-10-CM | POA: Diagnosis present

## 2023-08-22 DIAGNOSIS — L89156 Pressure-induced deep tissue damage of sacral region: Secondary | ICD-10-CM | POA: Diagnosis present

## 2023-08-22 DIAGNOSIS — D61818 Other pancytopenia: Secondary | ICD-10-CM | POA: Diagnosis not present

## 2023-08-22 DIAGNOSIS — Z83518 Family history of other specified eye disorder: Secondary | ICD-10-CM

## 2023-08-22 DIAGNOSIS — Z79899 Other long term (current) drug therapy: Secondary | ICD-10-CM

## 2023-08-22 DIAGNOSIS — Z66 Do not resuscitate: Secondary | ICD-10-CM | POA: Diagnosis present

## 2023-08-22 DIAGNOSIS — I420 Dilated cardiomyopathy: Secondary | ICD-10-CM | POA: Diagnosis present

## 2023-08-22 DIAGNOSIS — M6259 Muscle wasting and atrophy, not elsewhere classified, multiple sites: Secondary | ICD-10-CM | POA: Diagnosis present

## 2023-08-22 DIAGNOSIS — N179 Acute kidney failure, unspecified: Principal | ICD-10-CM | POA: Diagnosis present

## 2023-08-22 LAB — CBG MONITORING, ED
Glucose-Capillary: 312 mg/dL — ABNORMAL HIGH (ref 70–99)
Glucose-Capillary: 249 mg/dL — ABNORMAL HIGH (ref 70–99)

## 2023-08-22 LAB — CBC WITH DIFFERENTIAL/PLATELET
Abs Immature Granulocytes: 0.03 10*3/uL (ref 0.00–0.07)
Basophils Absolute: 0 10*3/uL (ref 0.0–0.1)
Basophils Relative: 1 %
Eosinophils Absolute: 0 10*3/uL (ref 0.0–0.5)
Eosinophils Relative: 0 %
HCT: 42.3 % (ref 36.0–46.0)
Hemoglobin: 12.7 g/dL (ref 12.0–15.0)
Immature Granulocytes: 1 %
Lymphocytes Relative: 13 %
Lymphs Abs: 0.8 10*3/uL (ref 0.7–4.0)
MCH: 30 pg (ref 26.0–34.0)
MCHC: 30 g/dL (ref 30.0–36.0)
MCV: 99.8 fL (ref 80.0–100.0)
Monocytes Absolute: 0.2 10*3/uL (ref 0.1–1.0)
Monocytes Relative: 4 %
Neutro Abs: 4.7 10*3/uL (ref 1.7–7.7)
Neutrophils Relative %: 81 %
Platelets: 146 10*3/uL — ABNORMAL LOW (ref 150–400)
RBC: 4.24 MIL/uL (ref 3.87–5.11)
RDW: 16.7 % — ABNORMAL HIGH (ref 11.5–15.5)
WBC: 5.8 10*3/uL (ref 4.0–10.5)
nRBC: 0 % (ref 0.0–0.2)

## 2023-08-22 LAB — URINALYSIS, ROUTINE W REFLEX MICROSCOPIC
Bilirubin Urine: NEGATIVE
Glucose, UA: 50 mg/dL — AB
Ketones, ur: NEGATIVE mg/dL
Leukocytes,Ua: NEGATIVE
Nitrite: NEGATIVE
Protein, ur: 30 mg/dL — AB
Specific Gravity, Urine: 1.019 (ref 1.005–1.030)
pH: 5 (ref 5.0–8.0)

## 2023-08-22 LAB — COMPREHENSIVE METABOLIC PANEL
ALT: 18 U/L (ref 0–44)
AST: 20 U/L (ref 15–41)
Albumin: 3.7 g/dL (ref 3.5–5.0)
Alkaline Phosphatase: 48 U/L (ref 38–126)
Anion gap: 13 (ref 5–15)
BUN: 83 mg/dL — ABNORMAL HIGH (ref 8–23)
CO2: 27 mmol/L (ref 22–32)
Calcium: 10.1 mg/dL (ref 8.9–10.3)
Chloride: 122 mmol/L — ABNORMAL HIGH (ref 98–111)
Creatinine, Ser: 2.57 mg/dL — ABNORMAL HIGH (ref 0.44–1.00)
GFR, Estimated: 17 mL/min — ABNORMAL LOW (ref >60)
Glucose, Bld: 334 mg/dL — ABNORMAL HIGH (ref 70–99)
Potassium: 4.2 mmol/L (ref 3.5–5.1)
Sodium: 162 mmol/L (ref 135–145)
Total Bilirubin: 1.1 mg/dL (ref 0.3–1.2)
Total Protein: 7.8 g/dL (ref 6.5–8.1)

## 2023-08-22 LAB — BASIC METABOLIC PANEL
Anion gap: 11 (ref 5–15)
BUN: 82 mg/dL — ABNORMAL HIGH (ref 8–23)
CO2: 25 mmol/L (ref 22–32)
Calcium: 8.7 mg/dL — ABNORMAL LOW (ref 8.9–10.3)
Chloride: 123 mmol/L — ABNORMAL HIGH (ref 98–111)
Creatinine, Ser: 2.43 mg/dL — ABNORMAL HIGH (ref 0.44–1.00)
GFR, Estimated: 18 mL/min — ABNORMAL LOW (ref >60)
Glucose, Bld: 302 mg/dL — ABNORMAL HIGH (ref 70–99)
Potassium: 4.5 mmol/L (ref 3.5–5.1)
Sodium: 159 mmol/L — ABNORMAL HIGH (ref 135–145)

## 2023-08-22 LAB — I-STAT CG4 LACTIC ACID, ED: Lactic Acid, Venous: 4.7 mmol/L (ref 0.5–1.9)

## 2023-08-22 LAB — LACTIC ACID, PLASMA: Lactic Acid, Venous: 2.5 mmol/L (ref 0.5–1.9)

## 2023-08-22 MED ORDER — ENOXAPARIN SODIUM 30 MG/0.3ML IJ SOSY
30.0000 mg | PREFILLED_SYRINGE | INTRAMUSCULAR | Status: DC
  Administered 2023-08-22 – 2023-08-23 (×2): 30 mg via SUBCUTANEOUS
  Filled 2023-08-22 (×2): qty 0.3

## 2023-08-22 MED ORDER — SODIUM CHLORIDE 0.9 % IV SOLN
1.0000 g | INTRAVENOUS | Status: DC
  Administered 2023-08-23: 1 g via INTRAVENOUS
  Filled 2023-08-22 (×2): qty 10

## 2023-08-22 MED ORDER — ACETAMINOPHEN 325 MG PO TABS: 650.0000 mg | ORAL_TABLET | Freq: Four times a day (QID) | ORAL | Status: DC | PRN

## 2023-08-22 MED ORDER — INSULIN ASPART 100 UNIT/ML IJ SOLN
0.0000 [IU] | INTRAMUSCULAR | Status: DC
  Administered 2023-08-22: 3 [IU] via SUBCUTANEOUS
  Administered 2023-08-23: 2 [IU] via SUBCUTANEOUS
  Administered 2023-08-23: 1 [IU] via SUBCUTANEOUS
  Administered 2023-08-23 (×3): 2 [IU] via SUBCUTANEOUS
  Administered 2023-08-23: 3 [IU] via SUBCUTANEOUS
  Administered 2023-08-24: 1 [IU] via SUBCUTANEOUS
  Administered 2023-08-24: 2 [IU] via SUBCUTANEOUS
  Filled 2023-08-22: qty 0.09

## 2023-08-22 MED ORDER — POLYETHYLENE GLYCOL 3350 17 G PO PACK: 17.0000 g | PACK | Freq: Every day | ORAL | Status: DC | PRN

## 2023-08-22 MED ORDER — PROCHLORPERAZINE EDISYLATE 10 MG/2ML IJ SOLN: 5.0000 mg | Freq: Four times a day (QID) | INTRAMUSCULAR | Status: DC | PRN

## 2023-08-22 MED ORDER — SODIUM CHLORIDE 0.9 % IV BOLUS
1000.0000 mL | Freq: Once | INTRAVENOUS | Status: AC
  Administered 2023-08-22: 1000 mL via INTRAVENOUS

## 2023-08-22 MED ORDER — SODIUM CHLORIDE 0.9 % IV SOLN
2.0000 g | Freq: Once | INTRAVENOUS | Status: AC
  Administered 2023-08-22: 2 g via INTRAVENOUS
  Filled 2023-08-22: qty 12.5

## 2023-08-22 MED ORDER — VANCOMYCIN HCL 750 MG/150ML IV SOLN
750.0000 mg | Freq: Once | INTRAVENOUS | Status: AC
  Administered 2023-08-22: 750 mg via INTRAVENOUS
  Filled 2023-08-22: qty 150

## 2023-08-22 MED ORDER — VANCOMYCIN HCL IN DEXTROSE 1-5 GM/200ML-% IV SOLN: 1000.0000 mg | Freq: Once | INTRAVENOUS | Status: DC

## 2023-08-22 MED ORDER — DEXTROSE-SODIUM CHLORIDE 5-0.45 % IV SOLN
INTRAVENOUS | Status: AC
  Administered 2023-08-22: 75 mL/h via INTRAVENOUS

## 2023-08-22 MED ORDER — DEXTROSE-SODIUM CHLORIDE 5-0.2 % IV SOLN: INTRAVENOUS | Status: DC

## 2023-08-22 MED ORDER — SODIUM CHLORIDE 0.9 % IV BOLUS
500.0000 mL | Freq: Once | INTRAVENOUS | Status: AC
  Administered 2023-08-22: 500 mL via INTRAVENOUS

## 2023-08-22 NOTE — ED Provider Notes (Signed)
[place] EMERGENCY DEPARTMENT AT [facility] Provider Note   CSN: [id number] Arrival date & time: 08/22/23  1416     History  Chief Complaint  Patient presents with   Aspiration    Alicia Vang is a 89 y.o. female.  HPI Presents via EMS from nursing facility with concern for unresponsiveness.  Patient cannot provide any details level 5 caveat.  Reportedly the patient was seen interacting abnormally or not interacting about 3 hours prior to ED arrival.  Family member noticed patient was not interacting normally, called for transport. EMS reports the patient was mildly hypotensive, but in no distress on transport. Patient is noted to be DNR.     Home Medications Prior to Admission medications   Medication Sig Start Date End Date Taking? Authorizing Provider  acetaminophen (TYLENOL) 325 MG tablet Take 2 tablets (650 mg total) by mouth every 6 (six) hours as needed for mild pain or headache (fever >/= 101). 10/24/20   Lonia Blood, MD  brinzolamide (AZOPT) 1 % ophthalmic suspension Place 1 drop into both eyes at bedtime. 03/10/21   [provider]  diclofenac Sodium (VOLTAREN) 1 % GEL Apply 2 g topically 4 (four) times daily as needed (lower back pain as needed). Patient not taking: Reported on 07/02/2022 06/06/21   Zigmund Daniel., MD  docusate sodium (COLACE) 100 MG capsule Take 1 capsule (100 mg total) by mouth 2 (two) times daily. 07/06/22   Narda Bonds, MD  furosemide (LASIX) 20 MG tablet Take 1 tablet (20 mg total) by mouth daily. 08/09/23 09/08/23  Arrien, York Ram, MD  latanoprost (XALATAN) 0.005 % ophthalmic solution Place 1 drop into both eyes at bedtime.    [provider]  losartan (COZAAR) 100 MG tablet Take 0.5 tablets (50 mg total) by mouth daily. 08/09/23   Arrien, York Ram, MD  polyethylene glycol (MIRALAX / GLYCOLAX) 17 g packet Take 17 g by mouth daily as needed for mild constipation. 07/06/22   Narda Bonds, MD  spironolactone (ALDACTONE) 25 MG tablet Take 0.5 tablets (12.5 mg total) by mouth daily. 08/01/23   Glade Lloyd, MD  Vitamin D, Ergocalciferol, (DRISDOL) 1.25 MG (50000 UNIT) CAPS capsule Take 50,000 Units by mouth once a week. 04/29/22   [provider]  XIIDRA 5 % SOLN Place 1 drop into both eyes at bedtime. 07/26/20   [provider]  XARELTO 15 MG TABS tablet Take 15 mg by mouth daily. Patient not taking: Reported on 06/01/2021 03/17/21 06/01/21  [provider]      Allergies    Amlodipine    Review of Systems   Review of Systems  Physical Exam Updated Vital Signs BP 96/83 (BP Location: Left Arm)   Pulse (!) 50   Temp 98.5 F (36.9 C) (Axillary)   Resp 18   SpO2 94%  Physical Exam Vitals and nursing note reviewed.  Constitutional:      Appearance: She is well-developed. She is ill-appearing.  HENT:     Head: Normocephalic and atraumatic.  Eyes:     Conjunctiva/sclera: Conjunctivae normal.  Cardiovascular:     Rate and Rhythm: Normal rate and regular rhythm.  Pulmonary:     Effort: Pulmonary effort is normal. No respiratory distress.     Breath sounds: Wheezing present.  Abdominal:     General: There is no distension.     Tenderness: There is no guarding.  Musculoskeletal:        General: No  deformity.  Skin:    General: Skin is warm and dry.     Coloration: Skin is pale.  Neurological:     Motor: Weakness and atrophy present.  Psychiatric:        Cognition and Memory: Cognition is impaired. Memory is impaired.     Comments: Unresponsive except to painful stimuli     ED Results / Procedures / Treatments   Labs (all labs ordered are listed, but only abnormal results are displayed) Labs Reviewed  COMPREHENSIVE METABOLIC PANEL  CBC WITH DIFFERENTIAL/PLATELET  URINALYSIS, ROUTINE W REFLEX MICROSCOPIC  CBG MONITORING, ED  I-STAT CG4 LACTIC ACID, ED    EKG None  Radiology No results found.  Procedures Procedures     Medications Ordered in ED Medications - No data to display  ED Course/ Medical Decision Making/ A&P                                 Medical Decision Making Advanced age female with cognitive impairment presents with concern for unresponsiveness.  Broad differential including progression of disease, intracranial abnormality, infection, bacteremia, sepsis including aspiration pneumonia considered. Pulse ox 94% room air borderline Cardiac 50s, 60s, sinus, some ectopy abnormal    Amount and/or Complexity of Data Reviewed Independent Historian: EMS External Data Reviewed: notes. Labs: ordered. Decision-making details documented in ED Course. Radiology: ordered and independent interpretation performed. Decision-making details documented in ED Course. ECG/medicine tests: ordered and independent interpretation performed. Decision-making details documented in ED Course.  Risk Prescription drug management. Decision regarding hospitalization. Diagnosis or treatment significantly limited by social determinants of health.   After the initial evaluation the patient's daughter arrives.  I had a very lengthy conversation with her about the patient's recent 2 hospitalizations, decline recently, and today's presentation.  I confirmed the patient's DNR status, we discussed goals of care, importance of keeping the patient comfortable.  On signout the patient was awaiting x-ray, labs, though the initial lactic acid value was elevated and there is concern for dehydration versus pneumonia versus sepsis.  On conversation with Dr. Archer Asa, who is assuming care, patient is starting antibiotics, with anticipated admission, palliative consult.  On chart review it clear the patient has acute kidney injury, hyper natremia, likely both contributing to her change in interactivity, and consistent with concern for worsening overall clinical status.        Final Clinical Impression(s) / ED Diagnoses Final  diagnoses:  AKI (acute kidney injury) (HCC)  Hypernatremia    Rx / DC Orders ED Discharge Orders     None         Gerhard Munch, MD 08/22/23 1656

## 2023-08-22 NOTE — ED Notes (Signed)
..ED TO INPATIENT HANDOFF REPORT  ED Nurse Name and Phone #: Buckner Malta [phone]  S Name/Age/Gender Alicia Vang 89 y.o. female Room/Bed: WA03/WA03  Code Status   Code Status: Limited: Do not attempt resuscitation (DNR) -DNR-LIMITED -Do Not Intubate/DNI   Home/SNF/Other Home Patient oriented to: self Is this baseline? No   Triage Complete: Triage complete  Chief Complaint Adult failure to thrive [R62.7]  Triage Note Pt arrives EMS from [facility] for possible aspiration. Pt has been unresponsive since noon, family member attempted to give water, pt started coughing. Audible rasping. Pt unresponsive. DNR at bedside. 18ga LAC     Allergies Allergies  Allergen Reactions   Amlodipine Swelling and Other (See Comments)    Swelling in feet    Level of Care/Admitting Diagnosis ED Disposition     ED Disposition  Admit   Condition  --   Comment  Hospital Area: [facility] [[id number]]  Level of Care: Stepdown [14]  Admit to SDU based on following criteria: Severe physiological/psychological symptoms:  Any diagnosis requiring assessment & intervention at least every 4 hours on an ongoing basis to obtain desired patient outcomes including stability and rehabilitation  May admit patient to [facility] or [facility] if equivalent level of care is available:: Yes  Covid Evaluation: Asymptomatic - no recent exposure (last 10 days) testing not required  Diagnosis: Adult failure to thrive [783.7.ICD-9-CM]  Admitting Physician: Darlin Drop [[id number]]  Attending Physician: Darlin Drop [[id number]]  Certification:: I certify this patient will need inpatient services for at least 2 midnights  Expected Medical Readiness: 08/25/2023          B Medical/Surgery History Past Medical History:  Diagnosis Date   Arthritis    Diabetes mellitus without complication (HCC)    Type II   Hypertension    Hypokalemia    Past Surgical History:  Procedure  Laterality Date   ABDOMINAL HYSTERECTOMY     CHALAZION EXCISION  11/06/2011   Procedure: MINOR EXCISION OF CHALAZION;  Surgeon: Vita Erm.;  Location: [facility];  Service: Ophthalmology;  Laterality: Left;   CHALAZION EXCISION  02/02/2012   Procedure: MINOR EXCISION OF CHALAZION;  Surgeon: Vita Erm., MD;  Location: [facility];  Service: Ophthalmology;  Laterality: Left;  left eye upper lid   CHALAZION EXCISION Right 03/28/2013   Procedure: MINOR EXCISION OF CHALAZION upper and lower right eye ;  Surgeon: Vita Erm., MD;  Location: [facility];  Service: Ophthalmology;  Laterality: Right;   CHALAZION EXCISION Right 03/28/2013   Procedure: MINOR EXCISION OF CHALAZION UPPER AND LOWER RIGHT EYE  ;  Surgeon: Vita Erm., MD;  Location: [facility] OR;  Service: Ophthalmology;  Laterality: Right;   COLONOSCOPY     HIP PINNING,CANNULATED Right 07/02/2022   Procedure: PERCUTANEOUS SCREW FIXATION OF HIP;  Surgeon: Jones Broom, MD;  Location: [facility] OR;  Service: Orthopedics;  Laterality: Right;   IR THORACENTESIS ASP PLEURAL SPACE W/IMG GUIDE  07/26/2023   TOTAL HIP ARTHROPLASTY Left 10/26/2017   Procedure: TOTAL HIP ARTHROPLASTY ANTERIOR APPROACH;  Surgeon: Gean Birchwood, MD;  Location: [facility] OR;  Service: Orthopedics;  Laterality: Left;     A IV Location/Drains/Wounds Patient Lines/Drains/Airways Status     Active Line/Drains/Airways     Name Placement date Placement time Site Days   Peripheral IV 08/22/23 22 G Anterior;Distal;Left;Upper Arm 08/22/23  2017  Arm  less than 1   Pressure Injury  07/26/23 Sacrum Stage 2 -  Partial thickness loss of dermis presenting as a shallow open injury with a red, pink wound bed without slough. 07/26/23  0100  -- 27   Pressure Injury 07/27/23 Coccyx Mid Deep Tissue Pressure Injury - Purple or maroon localized area of discolored intact skin or blood-filled blister due to damage of  underlying soft tissue from pressure and/or shear. 3 cm x 1 cm purple maroon discoloratio 07/27/23  --  -- 26            Intake/Output Last 24 hours  Intake/Output Summary (Last 24 hours) at 08/22/2023 2247 Last data filed at 08/22/2023 2247 Gross per 24 hour  Intake 1650.12 ml  Output --  Net 1650.12 ml    Labs/Imaging Results for orders placed or performed during the hospital encounter of 08/22/23 (from the past 48 hour(s))  CBG monitoring, ED     Status: Abnormal   Collection Time: 08/22/23  3:11 PM  Result Value Ref Range   Glucose-Capillary 312 (H) 70 - 99 mg/dL    Comment: Glucose reference range applies only to samples taken after fasting for at least 8 hours.  Urinalysis, Routine w reflex microscopic -Urine, Clean Catch     Status: Abnormal   Collection Time: 08/22/23  3:29 PM  Result Value Ref Range   Color, Urine AMBER (A) YELLOW    Comment: BIOCHEMICALS MAY BE AFFECTED BY COLOR   APPearance CLOUDY (A) CLEAR   Specific Gravity, Urine 1.019 1.005 - 1.030   pH 5.0 5.0 - 8.0   Glucose, UA 50 (A) NEGATIVE mg/dL   Hgb urine dipstick MODERATE (A) NEGATIVE   Bilirubin Urine NEGATIVE NEGATIVE   Ketones, ur NEGATIVE NEGATIVE mg/dL   Protein, ur 30 (A) NEGATIVE mg/dL   Nitrite NEGATIVE NEGATIVE   Leukocytes,Ua NEGATIVE NEGATIVE   RBC / HPF 11-20 0 - 5 RBC/hpf   WBC, UA 0-5 0 - 5 WBC/hpf   Bacteria, UA FEW (A) NONE SEEN   Squamous Epithelial / HPF 11-20 0 - 5 /HPF   Mucus PRESENT    Hyaline Casts, UA PRESENT     Comment: Performed at [facility], [address], [place], [place] [address]  Comprehensive metabolic panel     Status: Abnormal   Collection Time: 08/22/23  3:38 PM  Result Value Ref Range   Sodium 162 (HH) 135 - 145 mmol/L    Comment: CRITICAL RESULT CALLED TO, READ BACK BY AND VERIFIED WITH A.CHENEY, RN AT 1641 ON 10.30.24 BY N.THOMPSON    Potassium 4.2 3.5 - 5.1 mmol/L   Chloride 122 (H) 98 - 111 mmol/L   CO2 27 22 - 32 mmol/L    Glucose, Bld 334 (H) 70 - 99 mg/dL    Comment: Glucose reference range applies only to samples taken after fasting for at least 8 hours.   BUN 83 (H) 8 - 23 mg/dL   Creatinine, Ser 6.96 (H) 0.44 - 1.00 mg/dL   Calcium 29.5 8.9 - 28.4 mg/dL   Total Protein 7.8 6.5 - 8.1 g/dL   Albumin 3.7 3.5 - 5.0 g/dL   AST 20 15 - 41 U/L   ALT 18 0 - 44 U/L   Alkaline Phosphatase 48 38 - 126 U/L   Total Bilirubin 1.1 0.3 - 1.2 mg/dL   GFR, Estimated 17 (L) >60 mL/min    Comment: (NOTE) Calculated using the CKD-EPI Creatinine Equation (2021)    Anion gap 13 5 - 15    Comment:  Performed at [facility], [address] [place], [place] [address]  CBC WITH DIFFERENTIAL     Status: Abnormal   Collection Time: 08/22/23  3:38 PM  Result Value Ref Range   WBC 5.8 4.0 - 10.5 K/uL   RBC 4.24 3.87 - 5.11 MIL/uL   Hemoglobin 12.7 12.0 - 15.0 g/dL   HCT 19.1 47.8 - 29.5 %   MCV 99.8 80.0 - 100.0 fL   MCH 30.0 26.0 - 34.0 pg   MCHC 30.0 30.0 - 36.0 g/dL   RDW 62.1 (H) 30.8 - 65.7 %   Platelets 146 (L) 150 - 400 K/uL   nRBC 0.0 0.0 - 0.2 %   Neutrophils Relative % 81 %   Neutro Abs 4.7 1.7 - 7.7 K/uL   Lymphocytes Relative 13 %   Lymphs Abs 0.8 0.7 - 4.0 K/uL   Monocytes Relative 4 %   Monocytes Absolute 0.2 0.1 - 1.0 K/uL   Eosinophils Relative 0 %   Eosinophils Absolute 0.0 0.0 - 0.5 K/uL   Basophils Relative 1 %   Basophils Absolute 0.0 0.0 - 0.1 K/uL   Immature Granulocytes 1 %   Abs Immature Granulocytes 0.03 0.00 - 0.07 K/uL    Comment: Performed at [facility], [address], [place], [place] [address]  I-Stat Lactic Acid, ED     Status: Abnormal   Collection Time: 08/22/23  4:10 PM  Result Value Ref Range   Lactic Acid, Venous 4.7 (HH) 0.5 - 1.9 mmol/L   Comment NOTIFIED PHYSICIAN   Lactic acid, plasma     Status: Abnormal   Collection Time: 08/22/23  8:18 PM  Result Value Ref Range   Lactic Acid, Venous 2.5 (HH) 0.5 - 1.9 mmol/L    Comment:  CRITICAL RESULT CALLED TO, READ BACK BY AND VERIFIED WITH BLACK, M. RN AT 2105 ON 08/22/23. FA Performed at [facility], [address], [place], [place] [address]   CBG monitoring, ED     Status: Abnormal   Collection Time: 08/22/23  8:20 PM  Result Value Ref Range   Glucose-Capillary 249 (H) 70 - 99 mg/dL    Comment: Glucose reference range applies only to samples taken after fasting for at least 8 hours.   Comment 1 Notify RN   Basic metabolic panel     Status: Abnormal   Collection Time: 08/22/23  9:03 PM  Result Value Ref Range   Sodium 159 (H) 135 - 145 mmol/L   Potassium 4.5 3.5 - 5.1 mmol/L    Comment: HEMOLYSIS AT THIS LEVEL MAY AFFECT RESULT   Chloride 123 (H) 98 - 111 mmol/L   CO2 25 22 - 32 mmol/L   Glucose, Bld 302 (H) 70 - 99 mg/dL    Comment: Glucose reference range applies only to samples taken after fasting for at least 8 hours.   BUN 82 (H) 8 - 23 mg/dL   Creatinine, Ser 4.13 (H) 0.44 - 1.00 mg/dL   Calcium 8.7 (L) 8.9 - 10.3 mg/dL   GFR, Estimated 18 (L) >60 mL/min    Comment: (NOTE) Calculated using the CKD-EPI Creatinine Equation (2021)    Anion gap 11 5 - 15    Comment: Performed at [facility], [address], [place], [place] [address]   CT CHEST ABDOMEN PELVIS WO CONTRAST  Result Date: 08/22/2023 CLINICAL DATA:  Sepsis, possible aspiration.  Unresponsive. EXAM: CT CHEST, ABDOMEN AND PELVIS WITHOUT CONTRAST TECHNIQUE: Multidetector CT imaging  of the chest, abdomen and pelvis was performed following the standard protocol without IV contrast. RADIATION DOSE REDUCTION: This exam was performed according to the departmental dose-optimization program which includes automated exposure control, adjustment of the mA and/or kV according to patient size and/or use of iterative reconstruction technique. COMPARISON:  CT abdomen and pelvis 07/01/2022 FINDINGS: CT CHEST FINDINGS Cardiovascular: No significant vascular findings. Normal  heart size. No pericardial effusion. Mediastinum/Nodes: No enlarged mediastinal, hilar, or axillary lymph nodes. Thyroid gland, trachea, and esophagus demonstrate no significant findings. Lungs/Pleura: There are calcified pleural plaques in both lung apices. Ill-defined patchy ground-glass and nodular ground glass opacities are seen in the right upper lobe, right middle lobe and right lower lobe. There is an air-fluid level in the right mainstem bronchus and occlusive plugging of bronchi throughout the right lower lobe. There is also some occlusive material within the left lower lobe bronchus centrally. There are patchy airspace opacities in the right lower lobe and minimally in the left lower lobe as well. Secretions are seen in the trachea. There is no pleural effusion or pneumothorax. Musculoskeletal: The bones are osteopenic. There are compression deformities of T5, T6 and T12 which are age indeterminate. CT ABDOMEN PELVIS FINDINGS Hepatobiliary: Hepatic cysts measuring up 2 3.4 cm appear unchanged. Gallbladder sludge is likely present. There is no biliary ductal dilatation allowing for lack of intravenous contrast. Pancreas: Not well defined on this noncontrast study. Grossly within normal limits. Spleen: Normal in size without focal abnormality. Adrenals/Urinary Tract: Adrenal glands are unremarkable. Kidneys are normal, without renal calculi, focal lesion, or hydronephrosis. The bladder is not well seen secondary to streak artifact in the pelvis. There are findings suspicious for large air-fluid level in the bladder. Stomach/Bowel: Limited evaluation for bowel pathology secondary to lack of oral and intravenous contrast. There is no evidence for bowel obstruction. No dilated bowel loops are seen. Colonic diverticula are present. The appendix is not well delineated on this study. Vascular/Lymphatic: There are atherosclerotic calcifications of the aorta. No definitive lymphadenopathy allowing for lack of  intravenous contrast. Reproductive: Not well evaluated secondary to lack of contrast and streak artifact in the pelvis. Other: No abdominal wall hernia or abnormality. No abdominopelvic ascites. Musculoskeletal: The bones are osteopenic. Left hip arthroplasty is present. Right-sided hip screws are present. There is severe compression deformity of L3 and mild compression deformity of L1 similar to prior. IMPRESSION: 1. Air-fluid level in the right mainstem bronchus with occlusive plugging of bronchi throughout the right lower lobe. There is also some occlusive material in the left lower lobe bronchus. Findings are compatible with aspiration. 2. Patchy airspace opacities in the right lower lobe and minimally in the left lower lobe worrisome for pneumonia. 3. Ill-defined patchy ground-glass and nodular ground-glass opacities in the right lung worrisome for infection. 4. Calcified pleural plaques in both lung apices. 5. Colonic diverticulosis. 6. Large air-fluid level in the bladder worrisome for infection. 7. Multiple compression deformities in the thoracic spine, age indeterminate. Aortic Atherosclerosis (ICD10-I70.0). Electronically Signed   By: Darliss Cheney M.D.   On: 08/22/2023 19:02   CT HEAD WO CONTRAST  Result Date: 08/22/2023 CLINICAL DATA:  Altered mental status EXAM: CT HEAD WITHOUT CONTRAST TECHNIQUE: Contiguous axial images were obtained from the base of the skull through the vertex without intravenous contrast. RADIATION DOSE REDUCTION: This exam was performed according to the departmental dose-optimization program which includes automated exposure control, adjustment of the mA and/or kV according to patient size and/or use of iterative reconstruction technique.  COMPARISON:  07/01/2022 FINDINGS: Brain: No evidence of acute infarction, hemorrhage, mass, mass effect, or midline shift. No hydrocephalus or extra-axial fluid collection. Age related cerebral atrophy. Periventricular white matter changes,  likely the sequela of chronic small vessel ischemic disease. Vascular: No hyperdense vessel. Skull: Negative for fracture or focal lesion. Sinuses/Orbits: No acute finding. Other: The mastoid air cells are well aerated. IMPRESSION: No acute intracranial process. Electronically Signed   By: Wiliam Ke M.D.   On: 08/22/2023 16:48   DG Chest Port 1 View  Result Date: 08/22/2023 CLINICAL DATA:  Altered level of consciousness. Possible aspiration. Unresponsive since noon. EXAM: PORTABLE CHEST 1 VIEW COMPARISON:  08/07/2023 FINDINGS: Heart size and pulmonary vascularity are normal. Emphysematous changes are suggested in the lungs. No airspace disease or consolidation. No pleural effusions. No pneumothorax. Mediastinal contours appear intact. Degenerative changes in the spine and shoulders. Compression deformity visualized at L1. Old healed left clavicular fracture. IMPRESSION: Emphysematous changes in the lungs. No evidence of active pulmonary disease. Electronically Signed   By: Burman Nieves M.D.   On: 08/22/2023 16:43    Pending Labs Unresulted Labs (From admission, onward)     Start     Ordered   08/23/23 0500  CBC  Tomorrow morning,   R        08/22/23 1939   08/23/23 0500  Basic metabolic panel  Tomorrow morning,   R        08/22/23 1939   08/23/23 0500  Procalcitonin  Tomorrow morning,   R       References:    Procalcitonin Lower Respiratory Tract Infection AND Sepsis Procalcitonin Algorithm   08/22/23 1939   08/23/23 0500  Magnesium  Tomorrow morning,   R        08/22/23 1939   08/23/23 0500  Phosphorus  Tomorrow morning,   R        08/22/23 1939   08/22/23 1958  MRSA Next Gen by PCR, Nasal  ONCE - URGENT,   URGENT        08/22/23 1957   08/22/23 1941  Lactic acid, plasma  (Lactic Acid)  STAT Now then every 3 hours,   R (with STAT occurrences)      08/22/23 1940   08/22/23 1940  Basic metabolic panel  Now then every 3 hours,   R (with TIMED occurrences)      08/22/23 1940             Vitals/Pain Today's Vitals   08/22/23 1937 08/22/23 2000 08/22/23 2100 08/22/23 2200  BP:  134/71 (!) 97/43 107/65  Pulse:  (!) 55 (!) 30 (!) 59  Resp:  15 14 14   Temp: 97.7 F (36.5 C)     TempSrc: Axillary     SpO2:   92% 100%    Isolation Precautions No active isolations  Medications Medications  enoxaparin (LOVENOX) injection 30 mg (30 mg Subcutaneous Given 08/22/23 2042)  ceFEPIme (MAXIPIME) 1 g in sodium chloride 0.9 % 100 mL IVPB (has no administration in time range)  insulin aspart (novoLOG) injection 0-9 Units (3 Units Subcutaneous Given 08/22/23 2041)  acetaminophen (TYLENOL) tablet 650 mg (has no administration in time range)  prochlorperazine (COMPAZINE) injection 5 mg (has no administration in time range)  polyethylene glycol (MIRALAX / GLYCOLAX) packet 17 g (has no administration in time range)  dextrose 5 % and 0.45 % NaCl infusion (75 mL/hr Intravenous New Bag/Given 08/22/23 2145)  sodium chloride 0.9 % bolus 500 mL (0 mLs  Intravenous Stopped 08/22/23 1910)  sodium chloride 0.9 % bolus 1,000 mL (0 mLs Intravenous Stopped 08/22/23 2247)  vancomycin (VANCOREADY) IVPB 750 mg/150 mL (0 mg Intravenous Stopped 08/22/23 2015)  ceFEPIme (MAXIPIME) 2 g in sodium chloride 0.9 % 100 mL IVPB (0 g Intravenous Stopped 08/22/23 1847)    Mobility non-ambulatory     Focused Assessments     R Recommendations: See Admitting Provider Note  Report given to:   Additional Notes:

## 2023-08-22 NOTE — ED Provider Notes (Addendum)
  Physical Exam  BP 99/76 (BP Location: Left Arm)   Pulse 78   Temp 98.5 F (36.9 C) (Axillary)   Resp 19   SpO2 95%   Physical Exam  Procedures  Procedures  ED Course / MDM    Medical Decision Making Care assumed at 3 PM.  Patient is here with failure to thrive.  Patient stopped eating since discharge from the hospital 2 weeks ago.  Patient was noted to be altered.  Patient was hypotensive on arrival.  Signout pending labs and CT head.  4 pm Labs show hyponatremia sodium 162.  Lactate is elevated at 4.7.  Patient has a history of aspiration pneumonia so I ordered Vanco and cefepime empirically.  Patient also has acute renal failure and I ordered CT chest abdomen pelvis.  I talked to the daughter who is at bedside.  She is the healthcare proxy.  She states that patient is DNR and she does not want pressors.  I discussed with her regarding G-tube placement.  She states that last admission, they also discussed the same matter and she cannot decide without consulting with her siblings.  7:32 PM CT chest and pelvis showed pneumonia.  Patient already received antibiotics.  At this point hospitalist to admit for sepsis from pneumonia, AKI, hyponatremia.   CRITICAL CARE Performed by: Richardean Canal   Total critical care time: 33 minutes  Critical care time was exclusive of separately billable procedures and treating other patients.  Critical care was necessary to treat or prevent imminent or life-threatening deterioration.  Critical care was time spent personally by me on the following activities: development of treatment plan with patient and/or surrogate as well as nursing, discussions with consultants, evaluation of patient's response to treatment, examination of patient, obtaining history from patient or surrogate, ordering and performing treatments and interventions, ordering and review of laboratory studies, ordering and review of radiographic studies, pulse oximetry and re-evaluation  of patient's condition.   Problems Addressed: AKI (acute kidney injury) (HCC): acute illness or injury HCAP (healthcare-associated pneumonia): acute illness or injury Hypernatremia: acute illness or injury Sepsis, due to unspecified organism, unspecified whether acute organ dysfunction present [facility]): acute illness or injury  Amount and/or Complexity of Data Reviewed Labs: ordered. Decision-making details documented in ED Course. Radiology: ordered and independent interpretation performed. Decision-making details documented in ED Course. ECG/medicine tests: ordered and independent interpretation performed. Decision-making details documented in ED Course.  Risk Prescription drug management. Decision regarding hospitalization.          Charlynne Pander, MD 08/22/23 Bernell List    Charlynne Pander, MD 08/22/23 2010

## 2023-08-22 NOTE — ED Notes (Signed)
When attempting to recollect BMP and Lactic, IV was noted to have infiltrated witrh approximatel,y 8-10 ml of fluid noted under the Pts skin on left forearm. IV was removed, and another IV was placed in left AC. Fluids abeing administered were doxycycline and 0.9 NaCL bolus

## 2023-08-22 NOTE — H&P (Addendum)
History and Physical  Alicia Vang NFA:[id number] DOB: [birth year] DOA: 08/22/2023  Referring physician: Dr, Silverio Lay, EDP  PCP: Knox Royalty, MD  Outpatient Specialists: Cardiology. Patient coming from: [facility].  Chief Complaint: Failure to thrive in an adult.  HPI: Alicia Vang is a 89 y.o. female with medical history significant for newly diagnosed HFrEF 25 to 30% (2D echo 07/26/23), concerns for infiltrative cardiomyopathy from last TTE, history of sinus bradycardia, hypertension, CKD 3B, DM2, cognitive impairment, who presents to the ED from SNF due to decreasing responsiveness for the past 2 days.  The patient was recently admitted for acute HFrEF and discharged on 08/09/2023 to SNF.  Reportedly, since her discharge from the hospital she has had minimal food intake.  Nursing home staff noticed that for the past 2 days she has been less responsive than usual and stopped eating.  She was brought into the ED for further evaluation.  In the ED, minimally responsive.  Afebrile without leukocytosis.  EDP discussed the case with the patient's daughter, Alicia Vang, also her medical power of attorney, who made decision to change her CODE STATUS from full code to DNR.    Lab studies were notable for elevated creatinine 2.57 from baseline of 1.08.  Severe hypernatremia with serum sodium of 162.  CT head was nonacute however CT chest abdomen pelvis revealed findings suggestive of aspiration.  The patient was promptly started on empiric IV antibiotics cefepime and received 1 dose of IV vancomycin.  TRH, hospitalist service, was asked to admit.  ED Course: Temperature 97.7.  BP 111/59, pulse 71, respiratory 17, O2 saturation 95% on room air.  Lab studies notable for serum sodium 162, chloride 122.  Serum glucose 334, BUN 83, creatinine 2.57, GFR 17.  WBC 5.8, hemoglobin 12.7, platelet 146.  Review of Systems: Review of systems as noted in the HPI. All other systems reviewed and are  negative.   Past Medical History:  Diagnosis Date   Arthritis    Diabetes mellitus without complication (HCC)    Type II   Hypertension    Hypokalemia    Past Surgical History:  Procedure Laterality Date   ABDOMINAL HYSTERECTOMY     CHALAZION EXCISION  11/06/2011   Procedure: MINOR EXCISION OF CHALAZION;  Surgeon: Vita Erm.;  Location: [facility];  Service: Ophthalmology;  Laterality: Left;   CHALAZION EXCISION  02/02/2012   Procedure: MINOR EXCISION OF CHALAZION;  Surgeon: Vita Erm., MD;  Location: [facility];  Service: Ophthalmology;  Laterality: Left;  left eye upper lid   CHALAZION EXCISION Right 03/28/2013   Procedure: MINOR EXCISION OF CHALAZION upper and lower right eye ;  Surgeon: Vita Erm., MD;  Location:  SURGERY CENTER;  Service: Ophthalmology;  Laterality: Right;   CHALAZION EXCISION Right 03/28/2013   Procedure: MINOR EXCISION OF CHALAZION UPPER AND LOWER RIGHT EYE  ;  Surgeon: Vita Erm., MD;  Location[facility] OR;  Service: Ophthalmology;  Laterality: Right;   COLONOSCOPY     HIP PINNING,CANNULATED Right 07/02/2022   Procedure: PERCUTANEOUS SCREW FIXATION OF HIP;  Surgeon: Jones Broom, MD;  Location: [facility] OR;  Service: Orthopedics;  Laterality: Right;   IR THORACENTESIS ASP PLEURAL SPACE W/IMG GUIDE  07/26/2023   TOTAL HIP ARTHROPLASTY Left 10/26/2017   Procedure: TOTAL HIP ARTHROPLASTY ANTERIOR APPROACH;  Surgeon: Gean Birchwood, MD;  Location: [facility] OR;  Service: Orthopedics;  Laterality: Left;    Social History:  reports that she  has never smoked. She has never used smokeless tobacco. She reports that she does not drink alcohol and does not use drugs.   Allergies  Allergen Reactions   Amlodipine Swelling    Swelling in feet    Family History  Problem Relation Age of Onset   Stroke Mother    Hypertension Mother    Cataracts Mother    Cataracts Father    Heart attack Neg Hx        Prior to Admission medications   Medication Sig Start Date End Date Taking? Authorizing Provider  acetaminophen (TYLENOL) 325 MG tablet Take 2 tablets (650 mg total) by mouth every 6 (six) hours as needed for mild pain or headache (fever >/= 101). 10/24/20   Lonia Blood, MD  brinzolamide (AZOPT) 1 % ophthalmic suspension Place 1 drop into both eyes at bedtime. 03/10/21   [provider]  diclofenac Sodium (VOLTAREN) 1 % GEL Apply 2 g topically 4 (four) times daily as needed (lower back pain as needed). Patient not taking: Reported on 07/02/2022 06/06/21   Zigmund Daniel., MD  docusate sodium (COLACE) 100 MG capsule Take 1 capsule (100 mg total) by mouth 2 (two) times daily. 07/06/22   Narda Bonds, MD  furosemide (LASIX) 20 MG tablet Take 1 tablet (20 mg total) by mouth daily. 08/09/23 09/08/23  Arrien, York Ram, MD  latanoprost (XALATAN) 0.005 % ophthalmic solution Place 1 drop into both eyes at bedtime.    [provider]  losartan (COZAAR) 100 MG tablet Take 0.5 tablets (50 mg total) by mouth daily. 08/09/23   Arrien, York Ram, MD  polyethylene glycol (MIRALAX / GLYCOLAX) 17 g packet Take 17 g by mouth daily as needed for mild constipation. 07/06/22   Narda Bonds, MD  spironolactone (ALDACTONE) 25 MG tablet Take 0.5 tablets (12.5 mg total) by mouth daily. 08/01/23   Glade Lloyd, MD  Vitamin D, Ergocalciferol, (DRISDOL) 1.25 MG (50000 UNIT) CAPS capsule Take 50,000 Units by mouth once a week. 04/29/22   [provider]  XIIDRA 5 % SOLN Place 1 drop into both eyes at bedtime. 07/26/20   [provider]  XARELTO 15 MG TABS tablet Take 15 mg by mouth daily. Patient not taking: Reported on 06/01/2021 03/17/21 06/01/21  [provider]    Physical Exam: BP 99/76 (BP Location: Left Arm)   Pulse 78   Temp 97.7 F (36.5 C) (Axillary)   Resp 19   SpO2 95%   General: 89 y.o. year-old female well developed well nourished in  no acute distress.  Hypersomnolent. Cardiovascular: Regular rate and rhythm with no rubs or gallops.  No thyromegaly or JVD noted.  No lower extremity edema. 2/4 pulses in all 4 extremities. Respiratory: Diffuse rales bilaterally.  Poor inspiratory effort. Abdomen: Soft nontender nondistended with normal bowel sounds x4 quadrants. Muskuloskeletal: No cyanosis, clubbing or edema noted bilaterally Neuro: CN II-XII intact, strength, sensation, reflexes Skin: No ulcerative lesions noted or rashes Psychiatry: Unable to assess judgment and mood due to hypersomnolence.          Labs on Admission:  Basic Metabolic Panel: Recent Labs  Lab 08/22/23 1538  NA 162*  K 4.2  CL 122*  CO2 27  GLUCOSE 334*  BUN 83*  CREATININE 2.57*  CALCIUM 10.1   Liver Function Tests: Recent Labs  Lab 08/22/23 1538  AST 20  ALT 18  ALKPHOS 48  BILITOT 1.1  PROT 7.8  ALBUMIN 3.7   No  results for input(s): "LIPASE", "AMYLASE" in the last 168 hours. No results for input(s): "AMMONIA" in the last 168 hours. CBC: Recent Labs  Lab 08/22/23 1538  WBC 5.8  NEUTROABS 4.7  HGB 12.7  HCT 42.3  MCV 99.8  PLT 146*   Cardiac Enzymes: No results for input(s): "CKTOTAL", "CKMB", "CKMBINDEX", "TROPONINI" in the last 168 hours.  BNP (last 3 results) Recent Labs    07/25/23 2108 08/07/23 0418  BNP >4,500.0* 371.2*    ProBNP (last 3 results) No results for input(s): "PROBNP" in the last 8760 hours.  CBG: Recent Labs  Lab 08/22/23 1511  GLUCAP 312*    Radiological Exams on Admission: CT CHEST ABDOMEN PELVIS WO CONTRAST  Result Date: 08/22/2023 CLINICAL DATA:  Sepsis, possible aspiration.  Unresponsive. EXAM: CT CHEST, ABDOMEN AND PELVIS WITHOUT CONTRAST TECHNIQUE: Multidetector CT imaging of the chest, abdomen and pelvis was performed following the standard protocol without IV contrast. RADIATION DOSE REDUCTION: This exam was performed according to the departmental dose-optimization program  which includes automated exposure control, adjustment of the mA and/or kV according to patient size and/or use of iterative reconstruction technique. COMPARISON:  CT abdomen and pelvis 07/01/2022 FINDINGS: CT CHEST FINDINGS Cardiovascular: No significant vascular findings. Normal heart size. No pericardial effusion. Mediastinum/Nodes: No enlarged mediastinal, hilar, or axillary lymph nodes. Thyroid gland, trachea, and esophagus demonstrate no significant findings. Lungs/Pleura: There are calcified pleural plaques in both lung apices. Ill-defined patchy ground-glass and nodular ground glass opacities are seen in the right upper lobe, right middle lobe and right lower lobe. There is an air-fluid level in the right mainstem bronchus and occlusive plugging of bronchi throughout the right lower lobe. There is also some occlusive material within the left lower lobe bronchus centrally. There are patchy airspace opacities in the right lower lobe and minimally in the left lower lobe as well. Secretions are seen in the trachea. There is no pleural effusion or pneumothorax. Musculoskeletal: The bones are osteopenic. There are compression deformities of T5, T6 and T12 which are age indeterminate. CT ABDOMEN PELVIS FINDINGS Hepatobiliary: Hepatic cysts measuring up 2 3.4 cm appear unchanged. Gallbladder sludge is likely present. There is no biliary ductal dilatation allowing for lack of intravenous contrast. Pancreas: Not well defined on this noncontrast study. Grossly within normal limits. Spleen: Normal in size without focal abnormality. Adrenals/Urinary Tract: Adrenal glands are unremarkable. Kidneys are normal, without renal calculi, focal lesion, or hydronephrosis. The bladder is not well seen secondary to streak artifact in the pelvis. There are findings suspicious for large air-fluid level in the bladder. Stomach/Bowel: Limited evaluation for bowel pathology secondary to lack of oral and intravenous contrast. There is no  evidence for bowel obstruction. No dilated bowel loops are seen. Colonic diverticula are present. The appendix is not well delineated on this study. Vascular/Lymphatic: There are atherosclerotic calcifications of the aorta. No definitive lymphadenopathy allowing for lack of intravenous contrast. Reproductive: Not well evaluated secondary to lack of contrast and streak artifact in the pelvis. Other: No abdominal wall hernia or abnormality. No abdominopelvic ascites. Musculoskeletal: The bones are osteopenic. Left hip arthroplasty is present. Right-sided hip screws are present. There is severe compression deformity of L3 and mild compression deformity of L1 similar to prior. IMPRESSION: 1. Air-fluid level in the right mainstem bronchus with occlusive plugging of bronchi throughout the right lower lobe. There is also some occlusive material in the left lower lobe bronchus. Findings are compatible with aspiration. 2. Patchy airspace opacities in the right lower lobe and  minimally in the left lower lobe worrisome for pneumonia. 3. Ill-defined patchy ground-glass and nodular ground-glass opacities in the right lung worrisome for infection. 4. Calcified pleural plaques in both lung apices. 5. Colonic diverticulosis. 6. Large air-fluid level in the bladder worrisome for infection. 7. Multiple compression deformities in the thoracic spine, age indeterminate. Aortic Atherosclerosis (ICD10-I70.0). Electronically Signed   By: Darliss Cheney M.D.   On: 08/22/2023 19:02   CT HEAD WO CONTRAST  Result Date: 08/22/2023 CLINICAL DATA:  Altered mental status EXAM: CT HEAD WITHOUT CONTRAST TECHNIQUE: Contiguous axial images were obtained from the base of the skull through the vertex without intravenous contrast. RADIATION DOSE REDUCTION: This exam was performed according to the departmental dose-optimization program which includes automated exposure control, adjustment of the mA and/or kV according to patient size and/or use of  iterative reconstruction technique. COMPARISON:  07/01/2022 FINDINGS: Brain: No evidence of acute infarction, hemorrhage, mass, mass effect, or midline shift. No hydrocephalus or extra-axial fluid collection. Age related cerebral atrophy. Periventricular white matter changes, likely the sequela of chronic small vessel ischemic disease. Vascular: No hyperdense vessel. Skull: Negative for fracture or focal lesion. Sinuses/Orbits: No acute finding. Other: The mastoid air cells are well aerated. IMPRESSION: No acute intracranial process. Electronically Signed   By: Wiliam Ke M.D.   On: 08/22/2023 16:48   DG Chest Port 1 View  Result Date: 08/22/2023 CLINICAL DATA:  Altered level of consciousness. Possible aspiration. Unresponsive since noon. EXAM: PORTABLE CHEST 1 VIEW COMPARISON:  08/07/2023 FINDINGS: Heart size and pulmonary vascularity are normal. Emphysematous changes are suggested in the lungs. No airspace disease or consolidation. No pleural effusions. No pneumothorax. Mediastinal contours appear intact. Degenerative changes in the spine and shoulders. Compression deformity visualized at L1. Old healed left clavicular fracture. IMPRESSION: Emphysematous changes in the lungs. No evidence of active pulmonary disease. Electronically Signed   By: Burman Nieves M.D.   On: 08/22/2023 16:43    EKG: I independently viewed the EKG done and my findings are as followed: Sinus rhythm rate of 82.  Nonspecific ST-T changes.  QTc 466.  Assessment/Plan Present on Admission:  Adult failure to thrive  Principal Problem:   Adult failure to thrive  Adult failure to thrive Suspect multifactorial, related to cognitive impairment, dysphagia. IV fluid hydration Speech therapist evaluation prior to feeding Palliative care consultation to assist with establishing goals of care  Severe hypovolemic hypernatremia secondary to severe dehydration from poor oral intake Presented with serum sodium 162 Made N.p.o. in  the ED due to concern for dysphagia and aspiration D5 one quarter NS at 75 cc/h x 1 day Avoid quick correction of hypernatremia, no more than 10 mill equivalent correction in 24-hour window. BMP every 3 hours x 5 occurrences. Admitted to stepdown unit for closer monitoring.  Aspiration pneumonia, in the setting of dysphagia 1, POA CT scan with findings suggestive of aspiration. Continue cefepime initiated in the ED Received 1 dose of IV vancomycin in the ED Obtain MRSA screening, if positive restart IV vancomycin or IV linezolid.  Acute metabolic encephalopathy in the setting of severe hypernatremia, severe dehydration Continue to treat underlying conditions Reorient as needed Fall precautions Aspiration precautions.  Concern for dysphagia and recurrent aspiration Last admission was discharged with recommendation for dysphagia 1 pure and nectar thick liquid diet Currently n.p.o. until passes a swallowing evaluation by speech therapist Aspiration precautions in place  Recent diagnosis of HFrEF 25 to 30% by TTE done on 07/26/2023 Hypovolemic on exam Closely monitor volume  status while on IV fluid for treatment of severe hypernatremia and severe dehydration Closely monitor strict I's and O's and daily weight  AKI on CKD 3B, prerenal in the setting of severe dehydration Baseline creatinine appears to be 1.08 with GFR 48 Presented with creatinine of 2.57 with GFR 17 Avoid nephrotoxic agents, dehydration, and hypotension IV fluid hydration as stated above Closely monitor urine output Repeat BMP in the morning  History of hypertension BPs are currently soft Hold off antihypertensives Closely monitor vital signs  Type 2 diabetes with hyperglycemia Hemoglobin A1c 6.9 on 07/26/2023 Start insulin sliding scale every 4 hours while NPO.  Generalized weakness When more stable, PT OT evaluation Fall precautions TOC to assist with DC planning  Concerns of infiltrative cardiomyopathy  seen on last 2D echo done on 07/26/2023 History of sinus bradycardia PACs Wenckebach During previous admission, per cardiology's note, given the patient's comorbidities and bedbound status she is not a candidate for aggressive care.  Goals of care Overall poor prognosis Palliative care medicine consulted to assist with establishing goals of care Currently DNR per the patient's daughter who is also her medical power of attorney. Continue to treat the treatable   Critical care time: 75 minutes.   DVT prophylaxis: Subcu Lovenox daily  Code Status: DNR  Family Communication: Updated the patient's daughter at bedside  Disposition Plan: Admitted to stepdown unit  Consults called: Palliative care medicine, TOC.  Admission status: Inpatient status.   Status is: Inpatient The patient requires at least 2 midnights for further evaluation and treatment of present condition.   Darlin Drop MD [facility] Pager [phone]  If 7PM-7AM, please contact night-coverage [email or web address] Password [email or web address]  08/22/2023, 7:48 PM

## 2023-08-22 NOTE — ED Triage Notes (Signed)
Pt arrives EMS from [facility] for possible aspiration. Pt has been unresponsive since noon, family member attempted to give water, pt started coughing. Audible rasping. Pt unresponsive. DNR at bedside. 18ga LAC

## 2023-08-22 NOTE — Progress Notes (Signed)
PHARMACY -  BRIEF ANTIBIOTIC NOTE   Pharmacy has received consult(s) for cefepime from an ED provider. The patient's profile has been reviewed for ht/wt/allergies/indication/available labs.    One time order(s) placed for cefepime 2g  Further antibiotics/pharmacy consults should be ordered by admitting physician if indicated.                        Thank you, Cherylin Mylar, PharmD Clinical Pharmacist  10/30/20244:27 PM

## 2023-08-23 DIAGNOSIS — E870 Hyperosmolality and hypernatremia: Secondary | ICD-10-CM | POA: Diagnosis not present

## 2023-08-23 DIAGNOSIS — R627 Adult failure to thrive: Secondary | ICD-10-CM | POA: Diagnosis not present

## 2023-08-23 DIAGNOSIS — J189 Pneumonia, unspecified organism: Secondary | ICD-10-CM

## 2023-08-23 DIAGNOSIS — N179 Acute kidney failure, unspecified: Secondary | ICD-10-CM

## 2023-08-23 LAB — GLUCOSE, CAPILLARY
Glucose-Capillary: 135 mg/dL — ABNORMAL HIGH (ref 70–99)
Glucose-Capillary: 157 mg/dL — ABNORMAL HIGH (ref 70–99)

## 2023-08-23 LAB — CBC
HCT: 42.8 % (ref 36.0–46.0)
Hemoglobin: 12.5 g/dL (ref 12.0–15.0)
MCH: 29.7 pg (ref 26.0–34.0)
MCHC: 29.2 g/dL — ABNORMAL LOW (ref 30.0–36.0)
MCV: 101.7 fL — ABNORMAL HIGH (ref 80.0–100.0)
Platelets: 116 10*3/uL — ABNORMAL LOW (ref 150–400)
RBC: 4.21 MIL/uL (ref 3.87–5.11)
RDW: 16.7 % — ABNORMAL HIGH (ref 11.5–15.5)
WBC: 4.5 10*3/uL (ref 4.0–10.5)
nRBC: 0 % (ref 0.0–0.2)

## 2023-08-23 LAB — BASIC METABOLIC PANEL
Anion gap: 11 (ref 5–15)
Anion gap: 9 (ref 5–15)
BUN: 83 mg/dL — ABNORMAL HIGH (ref 8–23)
BUN: 88 mg/dL — ABNORMAL HIGH (ref 8–23)
CO2: 25 mmol/L (ref 22–32)
CO2: 27 mmol/L (ref 22–32)
Calcium: 8.8 mg/dL — ABNORMAL LOW (ref 8.9–10.3)
Calcium: 8.9 mg/dL (ref 8.9–10.3)
Chloride: 123 mmol/L — ABNORMAL HIGH (ref 98–111)
Chloride: 124 mmol/L — ABNORMAL HIGH (ref 98–111)
Creatinine, Ser: 2.16 mg/dL — ABNORMAL HIGH (ref 0.44–1.00)
Creatinine, Ser: 2.29 mg/dL — ABNORMAL HIGH (ref 0.44–1.00)
GFR, Estimated: 21 mL/min — ABNORMAL LOW (ref >60)
GFR, Estimated: 19 mL/min — ABNORMAL LOW (ref >60)
Glucose, Bld: 285 mg/dL — ABNORMAL HIGH (ref 70–99)
Glucose, Bld: 252 mg/dL — ABNORMAL HIGH (ref 70–99)
Potassium: 4.2 mmol/L (ref 3.5–5.1)
Potassium: 3.5 mmol/L (ref 3.5–5.1)
Sodium: 159 mmol/L — ABNORMAL HIGH (ref 135–145)
Sodium: 160 mmol/L — ABNORMAL HIGH (ref 135–145)

## 2023-08-23 LAB — LACTIC ACID, PLASMA
Lactic Acid, Venous: 3.3 mmol/L (ref 0.5–1.9)
Lactic Acid, Venous: 2.9 mmol/L (ref 0.5–1.9)

## 2023-08-23 LAB — MAGNESIUM: Magnesium: 2.8 mg/dL — ABNORMAL HIGH (ref 1.7–2.4)

## 2023-08-23 LAB — CBG MONITORING, ED
Glucose-Capillary: 160 mg/dL — ABNORMAL HIGH (ref 70–99)
Glucose-Capillary: 168 mg/dL — ABNORMAL HIGH (ref 70–99)
Glucose-Capillary: 194 mg/dL — ABNORMAL HIGH (ref 70–99)
Glucose-Capillary: 234 mg/dL — ABNORMAL HIGH (ref 70–99)

## 2023-08-23 LAB — PHOSPHORUS: Phosphorus: 3.7 mg/dL (ref 2.5–4.6)

## 2023-08-23 LAB — PROCALCITONIN: Procalcitonin: 0.82 ng/mL

## 2023-08-23 LAB — MRSA NEXT GEN BY PCR, NASAL
MRSA by PCR Next Gen: NOT DETECTED
MRSA by PCR Next Gen: NOT DETECTED

## 2023-08-23 MED ORDER — ORAL CARE MOUTH RINSE: 15.0000 mL | OROMUCOSAL | Status: DC | PRN

## 2023-08-23 MED ORDER — ORAL CARE MOUTH RINSE
15.0000 mL | OROMUCOSAL | Status: DC
  Administered 2023-08-24 – 2023-09-07 (×58): 15 mL via OROMUCOSAL

## 2023-08-23 MED ORDER — HYDROCORTISONE SOD SUC (PF) 100 MG IJ SOLR
100.0000 mg | Freq: Three times a day (TID) | INTRAMUSCULAR | Status: DC
  Administered 2023-08-23 – 2023-08-24 (×3): 100 mg via INTRAVENOUS
  Filled 2023-08-23 (×3): qty 2

## 2023-08-23 MED ORDER — CHLORHEXIDINE GLUCONATE CLOTH 2 % EX PADS
6.0000 | MEDICATED_PAD | Freq: Every day | CUTANEOUS | Status: DC
  Administered 2023-08-23 – 2023-09-07 (×14): 6 via TOPICAL

## 2023-08-23 MED ORDER — LACTATED RINGERS IV BOLUS
500.0000 mL | Freq: Once | INTRAVENOUS | Status: AC
  Administered 2023-08-23: 500 mL via INTRAVENOUS

## 2023-08-23 MED ORDER — LACTATED RINGERS IV BOLUS
1000.0000 mL | Freq: Once | INTRAVENOUS | Status: AC
  Administered 2023-08-23: 1000 mL via INTRAVENOUS

## 2023-08-23 NOTE — ED Notes (Signed)
ED TO INPATIENT HANDOFF REPORT  ED Nurse Name and Phone #:  Mellody Dance  [phone]  S Name/Age/Gender Alicia Vang 89 y.o. female Room/Bed: WA03/WA03  Code Status   Code Status: Limited: Do not attempt resuscitation (DNR) -DNR-LIMITED -Do Not Intubate/DNI   Home/SNF/Other Skilled nursing facility Patient oriented to: self Is this baseline? No   Triage Complete: Triage complete  Chief Complaint Adult failure to thrive [R62.7]  Triage Note Pt arrives EMS from [facility] for possible aspiration. Pt has been unresponsive since noon, family member attempted to give water, pt started coughing. Audible rasping. Pt unresponsive. DNR at bedside. 18ga LAC     Allergies Allergies  Allergen Reactions   Amlodipine Swelling and Other (See Comments)    Swelling in feet    Level of Care/Admitting Diagnosis ED Disposition     ED Disposition  Admit   Condition  --   Comment  Hospital Area: [facility] [[id number]]  Level of Care: Stepdown [14]  Admit to SDU based on following criteria: Severe physiological/psychological symptoms:  Any diagnosis requiring assessment & intervention at least every 4 hours on an ongoing basis to obtain desired patient outcomes including stability and rehabilitation  May admit patient to [facility] or [facility] if equivalent level of care is available:: Yes  Covid Evaluation: Asymptomatic - no recent exposure (last 10 days) testing not required  Diagnosis: Adult failure to thrive [783.7.ICD-9-CM]  Admitting Physician: Darlin Drop [[id number]]  Attending Physician: Darlin Drop [[id number]]  Certification:: I certify this patient will need inpatient services for at least 2 midnights  Expected Medical Readiness: 08/25/2023          B Medical/Surgery History Past Medical History:  Diagnosis Date   Arthritis    Diabetes mellitus without complication (HCC)    Type II   Hypertension    Hypokalemia    Past Surgical History:   Procedure Laterality Date   ABDOMINAL HYSTERECTOMY     CHALAZION EXCISION  11/06/2011   Procedure: MINOR EXCISION OF CHALAZION;  Surgeon: Vita Erm.;  Location: [facility];  Service: Ophthalmology;  Laterality: Left;   CHALAZION EXCISION  02/02/2012   Procedure: MINOR EXCISION OF CHALAZION;  Surgeon: Vita Erm., MD;  Location: [facility];  Service: Ophthalmology;  Laterality: Left;  left eye upper lid   CHALAZION EXCISION Right 03/28/2013   Procedure: MINOR EXCISION OF CHALAZION upper and lower right eye ;  Surgeon: Vita Erm., MD;  Location: [facility];  Service: Ophthalmology;  Laterality: Right;   CHALAZION EXCISION Right 03/28/2013   Procedure: MINOR EXCISION OF CHALAZION UPPER AND LOWER RIGHT EYE  ;  Surgeon: Vita Erm., MD;  Location[facility] OR;  Service: Ophthalmology;  Laterality: Right;   COLONOSCOPY     HIP PINNING,CANNULATED Right 07/02/2022   Procedure: PERCUTANEOUS SCREW FIXATION OF HIP;  Surgeon: Jones Broom, MD;  Location: [facility] OR;  Service: Orthopedics;  Laterality: Right;   IR THORACENTESIS ASP PLEURAL SPACE W/IMG GUIDE  07/26/2023   TOTAL HIP ARTHROPLASTY Left 10/26/2017   Procedure: TOTAL HIP ARTHROPLASTY ANTERIOR APPROACH;  Surgeon: Gean Birchwood, MD;  Location: [facility] OR;  Service: Orthopedics;  Laterality: Left;     A IV Location/Drains/Wounds Patient Lines/Drains/Airways Status     Active Line/Drains/Airways     Name Placement date Placement time Site Days   Peripheral IV 08/22/23 22 G Anterior;Distal;Left;Upper Arm 08/22/23  2017  Arm  1  Pressure Injury 07/26/23 Sacrum Stage 2 -  Partial thickness loss of dermis presenting as a shallow open injury with a red, pink wound bed without slough. 07/26/23  0100  -- 28   Pressure Injury 07/27/23 Coccyx Mid Deep Tissue Pressure Injury - Purple or maroon localized area of discolored intact skin or blood-filled blister due to damage of  underlying soft tissue from pressure and/or shear. 3 cm x 1 cm purple maroon discoloratio 07/27/23  --  -- 27            Intake/Output Last 24 hours  Intake/Output Summary (Last 24 hours) at 08/23/2023 1316 Last data filed at 08/23/2023 1105 Gross per 24 hour  Intake 2650.12 ml  Output --  Net 2650.12 ml    Labs/Imaging Results for orders placed or performed during the hospital encounter of 08/22/23 (from the past 48 hour(s))  CBG monitoring, ED     Status: Abnormal   Collection Time: 08/22/23  3:11 PM  Result Value Ref Range   Glucose-Capillary 312 (H) 70 - 99 mg/dL    Comment: Glucose reference range applies only to samples taken after fasting for at least 8 hours.  Urinalysis, Routine w reflex microscopic -Urine, Clean Catch     Status: Abnormal   Collection Time: 08/22/23  3:29 PM  Result Value Ref Range   Color, Urine AMBER (A) YELLOW    Comment: BIOCHEMICALS MAY BE AFFECTED BY COLOR   APPearance CLOUDY (A) CLEAR   Specific Gravity, Urine 1.019 1.005 - 1.030   pH 5.0 5.0 - 8.0   Glucose, UA 50 (A) NEGATIVE mg/dL   Hgb urine dipstick MODERATE (A) NEGATIVE   Bilirubin Urine NEGATIVE NEGATIVE   Ketones, ur NEGATIVE NEGATIVE mg/dL   Protein, ur 30 (A) NEGATIVE mg/dL   Nitrite NEGATIVE NEGATIVE   Leukocytes,Ua NEGATIVE NEGATIVE   RBC / HPF 11-20 0 - 5 RBC/hpf   WBC, UA 0-5 0 - 5 WBC/hpf   Bacteria, UA FEW (A) NONE SEEN   Squamous Epithelial / HPF 11-20 0 - 5 /HPF   Mucus PRESENT    Hyaline Casts, UA PRESENT     Comment: Performed at [facility], [address], [place], [place] [address]  Comprehensive metabolic panel     Status: Abnormal   Collection Time: 08/22/23  3:38 PM  Result Value Ref Range   Sodium 162 (HH) 135 - 145 mmol/L    Comment: CRITICAL RESULT CALLED TO, READ BACK BY AND VERIFIED WITH A.CHENEY, RN AT 1641 ON 10.30.24 BY N.THOMPSON    Potassium 4.2 3.5 - 5.1 mmol/L   Chloride 122 (H) 98 - 111 mmol/L   CO2 27 22 - 32 mmol/L    Glucose, Bld 334 (H) 70 - 99 mg/dL    Comment: Glucose reference range applies only to samples taken after fasting for at least 8 hours.   BUN 83 (H) 8 - 23 mg/dL   Creatinine, Ser 6.57 (H) 0.44 - 1.00 mg/dL   Calcium 84.6 8.9 - 96.2 mg/dL   Total Protein 7.8 6.5 - 8.1 g/dL   Albumin 3.7 3.5 - 5.0 g/dL   AST 20 15 - 41 U/L   ALT 18 0 - 44 U/L   Alkaline Phosphatase 48 38 - 126 U/L   Total Bilirubin 1.1 0.3 - 1.2 mg/dL   GFR, Estimated 17 (L) >60 mL/min    Comment: (NOTE) Calculated using the CKD-EPI Creatinine Equation (2021)    Anion gap 13 5 - 15  Comment: Performed at [facility], [address], [place], [place] [address]  CBC WITH DIFFERENTIAL     Status: Abnormal   Collection Time: 08/22/23  3:38 PM  Result Value Ref Range   WBC 5.8 4.0 - 10.5 K/uL   RBC 4.24 3.87 - 5.11 MIL/uL   Hemoglobin 12.7 12.0 - 15.0 g/dL   HCT 84.1 32.4 - 40.1 %   MCV 99.8 80.0 - 100.0 fL   MCH 30.0 26.0 - 34.0 pg   MCHC 30.0 30.0 - 36.0 g/dL   RDW 02.7 (H) 25.3 - 66.4 %   Platelets 146 (L) 150 - 400 K/uL   nRBC 0.0 0.0 - 0.2 %   Neutrophils Relative % 81 %   Neutro Abs 4.7 1.7 - 7.7 K/uL   Lymphocytes Relative 13 %   Lymphs Abs 0.8 0.7 - 4.0 K/uL   Monocytes Relative 4 %   Monocytes Absolute 0.2 0.1 - 1.0 K/uL   Eosinophils Relative 0 %   Eosinophils Absolute 0.0 0.0 - 0.5 K/uL   Basophils Relative 1 %   Basophils Absolute 0.0 0.0 - 0.1 K/uL   Immature Granulocytes 1 %   Abs Immature Granulocytes 0.03 0.00 - 0.07 K/uL    Comment: Performed at [facility], [address] [place], [place] [address]  I-Stat Lactic Acid, ED     Status: Abnormal   Collection Time: 08/22/23  4:10 PM  Result Value Ref Range   Lactic Acid, Venous 4.7 (HH) 0.5 - 1.9 mmol/L   Comment NOTIFIED PHYSICIAN   Lactic acid, plasma     Status: Abnormal   Collection Time: 08/22/23  8:18 PM  Result Value Ref Range   Lactic Acid, Venous 2.5 (HH) 0.5 - 1.9 mmol/L    Comment:  CRITICAL RESULT CALLED TO, READ BACK BY AND VERIFIED WITH BLACK, M. RN AT 2105 ON 08/22/23. FA Performed at [facility], [address] [place], [place] [address]   CBG monitoring, ED     Status: Abnormal   Collection Time: 08/22/23  8:20 PM  Result Value Ref Range   Glucose-Capillary 249 (H) 70 - 99 mg/dL    Comment: Glucose reference range applies only to samples taken after fasting for at least 8 hours.   Comment 1 Notify RN   MRSA Next Gen by PCR, Nasal     Status: None   Collection Time: 08/22/23  8:46 PM   Specimen: Nasal Mucosa; Nasal Swab  Result Value Ref Range   MRSA by PCR Next Gen NOT DETECTED NOT DETECTED    Comment: (NOTE) The GeneXpert MRSA Assay (FDA approved for NASAL specimens only), is one component of a comprehensive MRSA colonization surveillance program. It is not intended to diagnose MRSA infection nor to guide or monitor treatment for MRSA infections. Test performance is not FDA approved in patients less than 77 years old. Performed at [facility], [address] [place], [place] [address]   Basic metabolic panel     Status: Abnormal   Collection Time: 08/22/23  9:03 PM  Result Value Ref Range   Sodium 159 (H) 135 - 145 mmol/L   Potassium 4.5 3.5 - 5.1 mmol/L    Comment: HEMOLYSIS AT THIS LEVEL MAY AFFECT RESULT   Chloride 123 (H) 98 - 111 mmol/L   CO2 25 22 - 32 mmol/L   Glucose, Bld 302 (H) 70 - 99 mg/dL    Comment: Glucose reference range applies only to samples  taken after fasting for at least 8 hours.   BUN 82 (H) 8 - 23 mg/dL   Creatinine, Ser 6.64 (H) 0.44 - 1.00 mg/dL   Calcium 8.7 (L) 8.9 - 10.3 mg/dL   GFR, Estimated 18 (L) >60 mL/min    Comment: (NOTE) Calculated using the CKD-EPI Creatinine Equation (2021)    Anion gap 11 5 - 15    Comment: Performed at [facility], [address] [place], [place] [address]  CBG monitoring, ED     Status: Abnormal   Collection Time: 08/23/23  12:49 AM  Result Value Ref Range   Glucose-Capillary 234 (H) 70 - 99 mg/dL    Comment: Glucose reference range applies only to samples taken after fasting for at least 8 hours.   Comment 1 Notify RN   Lactic acid, plasma     Status: Abnormal   Collection Time: 08/23/23  2:01 AM  Result Value Ref Range   Lactic Acid, Venous 3.3 (HH) 0.5 - 1.9 mmol/L    Comment: CRITICAL VALUE NOTED. VALUE IS CONSISTENT WITH PREVIOUSLY REPORTED/CALLED VALUE Performed at [facility], [address] [place], [place] [address]   Basic metabolic panel     Status: Abnormal   Collection Time: 08/23/23  2:43 AM  Result Value Ref Range   Sodium 159 (H) 135 - 145 mmol/L   Potassium 4.2 3.5 - 5.1 mmol/L    Comment: HEMOLYSIS AT THIS LEVEL MAY AFFECT RESULT   Chloride 123 (H) 98 - 111 mmol/L   CO2 25 22 - 32 mmol/L   Glucose, Bld 285 (H) 70 - 99 mg/dL    Comment: Glucose reference range applies only to samples taken after fasting for at least 8 hours.   BUN 83 (H) 8 - 23 mg/dL   Creatinine, Ser 6.38 (H) 0.44 - 1.00 mg/dL   Calcium 8.8 (L) 8.9 - 10.3 mg/dL   GFR, Estimated 21 (L) >60 mL/min    Comment: (NOTE) Calculated using the CKD-EPI Creatinine Equation (2021)    Anion gap 11 5 - 15    Comment: Performed at [facility], [address], [place], [place] [address]  CBG monitoring, ED     Status: Abnormal   Collection Time: 08/23/23  4:16 AM  Result Value Ref Range   Glucose-Capillary 194 (H) 70 - 99 mg/dL    Comment: Glucose reference range applies only to samples taken after fasting for at least 8 hours.  CBC     Status: Abnormal   Collection Time: 08/23/23  4:33 AM  Result Value Ref Range   WBC 4.5 4.0 - 10.5 K/uL   RBC 4.21 3.87 - 5.11 MIL/uL   Hemoglobin 12.5 12.0 - 15.0 g/dL   HCT 32.9 51.8 - 84.1 %   MCV 101.7 (H) 80.0 - 100.0 fL   MCH 29.7 26.0 - 34.0 pg   MCHC 29.2 (L) 30.0 - 36.0 g/dL   RDW 66.0 (H) 63.0 - 16.0 %   Platelets 116 (L) 150 - 400 K/uL    nRBC 0.0 0.0 - 0.2 %    Comment: Performed at [facility], [address], [place], [place] [address]  Basic metabolic panel     Status: Abnormal   Collection Time: 08/23/23  4:33 AM  Result Value Ref Range   Sodium 160 (H) 135 - 145 mmol/L   Potassium 3.5 3.5 - 5.1 mmol/L   Chloride 124 (H) 98 - 111 mmol/L   CO2 27  22 - 32 mmol/L   Glucose, Bld 252 (H) 70 - 99 mg/dL    Comment: Glucose reference range applies only to samples taken after fasting for at least 8 hours.   BUN 88 (H) 8 - 23 mg/dL   Creatinine, Ser 1.61 (H) 0.44 - 1.00 mg/dL   Calcium 8.9 8.9 - 09.6 mg/dL   GFR, Estimated 19 (L) >60 mL/min    Comment: (NOTE) Calculated using the CKD-EPI Creatinine Equation (2021)    Anion gap 9 5 - 15    Comment: Performed at [facility], [address] [place], [place] [address]  Procalcitonin     Status: None   Collection Time: 08/23/23  4:33 AM  Result Value Ref Range   Procalcitonin 0.82 ng/mL    Comment:        Interpretation: PCT > 0.5 ng/mL and <= 2 ng/mL: Systemic infection (sepsis) is possible, but other conditions are known to elevate PCT as well. (NOTE)       Sepsis PCT Algorithm           Lower Respiratory Tract                                      Infection PCT Algorithm    ----------------------------     ----------------------------         PCT < 0.25 ng/mL                PCT < 0.10 ng/mL          Strongly encourage             Strongly discourage   discontinuation of antibiotics    initiation of antibiotics    ----------------------------     -----------------------------       PCT 0.25 - 0.50 ng/mL            PCT 0.10 - 0.25 ng/mL               OR       >80% decrease in PCT            Discourage initiation of                                            antibiotics      Encourage discontinuation           of antibiotics    ----------------------------     -----------------------------         PCT >= 0.50 ng/mL               PCT 0.26 - 0.50 ng/mL                AND       <80% decrease in PCT             Encourage initiation of                                             antibiotics       Encourage continuation           of antibiotics    ----------------------------     -----------------------------  PCT >= 0.50 ng/mL                  PCT > 0.50 ng/mL               AND         increase in PCT                  Strongly encourage                                      initiation of antibiotics    Strongly encourage escalation           of antibiotics                                     -----------------------------                                           PCT <= 0.25 ng/mL                                                 OR                                        > 80% decrease in PCT                                      Discontinue / Do not initiate                                             antibiotics  Performed at [facility], [address], [place], [place] [address]   Magnesium     Status: Abnormal   Collection Time: 08/23/23  4:33 AM  Result Value Ref Range   Magnesium 2.8 (H) 1.7 - 2.4 mg/dL    Comment: Performed at [facility], [address], [place], [place] [address]  Phosphorus     Status: None   Collection Time: 08/23/23  4:33 AM  Result Value Ref Range   Phosphorus 3.7 2.5 - 4.6 mg/dL    Comment: Performed at [facility], [address], [place], [place] [address]  CBG monitoring, ED     Status: Abnormal   Collection Time: 08/23/23  8:35 AM  Result Value Ref Range   Glucose-Capillary 160 (H) 70 - 99 mg/dL    Comment: Glucose reference range applies only to samples taken after fasting for at least 8 hours.  CBG monitoring, ED     Status: Abnormal   Collection Time: 08/23/23 11:14 AM  Result Value Ref Range   Glucose-Capillary 168 (H) 70 - 99 mg/dL    Comment: Glucose reference range applies only to samples taken  after fasting for at least 8  hours.   CT CHEST ABDOMEN PELVIS WO CONTRAST  Result Date: 08/22/2023 CLINICAL DATA:  Sepsis, possible aspiration.  Unresponsive. EXAM: CT CHEST, ABDOMEN AND PELVIS WITHOUT CONTRAST TECHNIQUE: Multidetector CT imaging of the chest, abdomen and pelvis was performed following the standard protocol without IV contrast. RADIATION DOSE REDUCTION: This exam was performed according to the departmental dose-optimization program which includes automated exposure control, adjustment of the mA and/or kV according to patient size and/or use of iterative reconstruction technique. COMPARISON:  CT abdomen and pelvis 07/01/2022 FINDINGS: CT CHEST FINDINGS Cardiovascular: No significant vascular findings. Normal heart size. No pericardial effusion. Mediastinum/Nodes: No enlarged mediastinal, hilar, or axillary lymph nodes. Thyroid gland, trachea, and esophagus demonstrate no significant findings. Lungs/Pleura: There are calcified pleural plaques in both lung apices. Ill-defined patchy ground-glass and nodular ground glass opacities are seen in the right upper lobe, right middle lobe and right lower lobe. There is an air-fluid level in the right mainstem bronchus and occlusive plugging of bronchi throughout the right lower lobe. There is also some occlusive material within the left lower lobe bronchus centrally. There are patchy airspace opacities in the right lower lobe and minimally in the left lower lobe as well. Secretions are seen in the trachea. There is no pleural effusion or pneumothorax. Musculoskeletal: The bones are osteopenic. There are compression deformities of T5, T6 and T12 which are age indeterminate. CT ABDOMEN PELVIS FINDINGS Hepatobiliary: Hepatic cysts measuring up 2 3.4 cm appear unchanged. Gallbladder sludge is likely present. There is no biliary ductal dilatation allowing for lack of intravenous contrast. Pancreas: Not well defined on this noncontrast study. Grossly within  normal limits. Spleen: Normal in size without focal abnormality. Adrenals/Urinary Tract: Adrenal glands are unremarkable. Kidneys are normal, without renal calculi, focal lesion, or hydronephrosis. The bladder is not well seen secondary to streak artifact in the pelvis. There are findings suspicious for large air-fluid level in the bladder. Stomach/Bowel: Limited evaluation for bowel pathology secondary to lack of oral and intravenous contrast. There is no evidence for bowel obstruction. No dilated bowel loops are seen. Colonic diverticula are present. The appendix is not well delineated on this study. Vascular/Lymphatic: There are atherosclerotic calcifications of the aorta. No definitive lymphadenopathy allowing for lack of intravenous contrast. Reproductive: Not well evaluated secondary to lack of contrast and streak artifact in the pelvis. Other: No abdominal wall hernia or abnormality. No abdominopelvic ascites. Musculoskeletal: The bones are osteopenic. Left hip arthroplasty is present. Right-sided hip screws are present. There is severe compression deformity of L3 and mild compression deformity of L1 similar to prior. IMPRESSION: 1. Air-fluid level in the right mainstem bronchus with occlusive plugging of bronchi throughout the right lower lobe. There is also some occlusive material in the left lower lobe bronchus. Findings are compatible with aspiration. 2. Patchy airspace opacities in the right lower lobe and minimally in the left lower lobe worrisome for pneumonia. 3. Ill-defined patchy ground-glass and nodular ground-glass opacities in the right lung worrisome for infection. 4. Calcified pleural plaques in both lung apices. 5. Colonic diverticulosis. 6. Large air-fluid level in the bladder worrisome for infection. 7. Multiple compression deformities in the thoracic spine, age indeterminate. Aortic Atherosclerosis (ICD10-I70.0). Electronically Signed   By: Darliss Cheney M.D.   On: 08/22/2023 19:02   CT  HEAD WO CONTRAST  Result Date: 08/22/2023 CLINICAL DATA:  Altered mental status EXAM: CT HEAD WITHOUT CONTRAST TECHNIQUE: Contiguous axial images were obtained from the base of the skull through the vertex without intravenous contrast. RADIATION  DOSE REDUCTION: This exam was performed according to the departmental dose-optimization program which includes automated exposure control, adjustment of the mA and/or kV according to patient size and/or use of iterative reconstruction technique. COMPARISON:  07/01/2022 FINDINGS: Brain: No evidence of acute infarction, hemorrhage, mass, mass effect, or midline shift. No hydrocephalus or extra-axial fluid collection. Age related cerebral atrophy. Periventricular white matter changes, likely the sequela of chronic small vessel ischemic disease. Vascular: No hyperdense vessel. Skull: Negative for fracture or focal lesion. Sinuses/Orbits: No acute finding. Other: The mastoid air cells are well aerated. IMPRESSION: No acute intracranial process. Electronically Signed   By: Wiliam Ke M.D.   On: 08/22/2023 16:48   DG Chest Port 1 View  Result Date: 08/22/2023 CLINICAL DATA:  Altered level of consciousness. Possible aspiration. Unresponsive since noon. EXAM: PORTABLE CHEST 1 VIEW COMPARISON:  08/07/2023 FINDINGS: Heart size and pulmonary vascularity are normal. Emphysematous changes are suggested in the lungs. No airspace disease or consolidation. No pleural effusions. No pneumothorax. Mediastinal contours appear intact. Degenerative changes in the spine and shoulders. Compression deformity visualized at L1. Old healed left clavicular fracture. IMPRESSION: Emphysematous changes in the lungs. No evidence of active pulmonary disease. Electronically Signed   By: Burman Nieves M.D.   On: 08/22/2023 16:43    Pending Labs Unresulted Labs (From admission, onward)     Start     Ordered   08/22/23 1940  Basic metabolic panel  Now then every 3 hours,   R (with TIMED  occurrences)      08/22/23 1940            Vitals/Pain Today's Vitals   08/23/23 0701 08/23/23 0940 08/23/23 1024 08/23/23 1208  BP:  (!) 118/57 (!) 118/57   Pulse:   (!) 52   Resp:  14 16   Temp: 97.9 F (36.6 C)   (!) 94.9 F (34.9 C)  TempSrc: Axillary   Oral  SpO2:  92% 94%     Isolation Precautions No active isolations  Medications Medications  enoxaparin (LOVENOX) injection 30 mg (30 mg Subcutaneous Given 08/22/23 2042)  ceFEPIme (MAXIPIME) 1 g in sodium chloride 0.9 % 100 mL IVPB (has no administration in time range)  insulin aspart (novoLOG) injection 0-9 Units (2 Units Subcutaneous Given 08/23/23 1127)  acetaminophen (TYLENOL) tablet 650 mg (has no administration in time range)  prochlorperazine (COMPAZINE) injection 5 mg (has no administration in time range)  polyethylene glycol (MIRALAX / GLYCOLAX) packet 17 g (has no administration in time range)  dextrose 5 % and 0.45 % NaCl infusion (0 mLs Intravenous Stopped 08/23/23 1105)  sodium chloride 0.9 % bolus 500 mL (0 mLs Intravenous Stopped 08/22/23 1910)  sodium chloride 0.9 % bolus 1,000 mL (0 mLs Intravenous Stopped 08/22/23 2247)  vancomycin (VANCOREADY) IVPB 750 mg/150 mL (0 mg Intravenous Stopped 08/22/23 2015)  ceFEPIme (MAXIPIME) 2 g in sodium chloride 0.9 % 100 mL IVPB (0 g Intravenous Stopped 08/22/23 1847)    Mobility non-ambulatory     Focused Assessments    R Recommendations: See Admitting Provider Note  Report given to:   Additional Notes:

## 2023-08-23 NOTE — Progress Notes (Signed)
[facility]                                                                               Alicia Vang, is a 89 y.o. female, DOB - 12-07-1926, OZH:[id number] Admit date - 08/22/2023    Outpatient Primary MD for the patient is Eloisa Northern, MD  LOS - 1  days    Brief summary    Alicia Vang is a 89 y.o. female with medical history significant for newly diagnosed HFrEF 25 to 30% (2D echo 07/26/23), concerns for infiltrative cardiomyopathy from last TTE, history of sinus bradycardia, hypertension, CKD 3B, DM2, cognitive impairment, who presents to the ED from SNF due to decreasing responsiveness for the past 2 days.  The patient was recently admitted for acute HFrEF and discharged on 08/09/2023 to SNF.  Reportedly, since her discharge from the hospital she has had minimal food intake.  Nursing home staff noticed that for the past 2 days she has been less responsive than usual and stopped eating.  She was brought into the ED for further evaluation.   Assessment & Plan    Assessment and Plan:   Failure to thrive probably secondary to progressive cognitive impairment, dysphagia and dehydration. Palliative care consulted to establish goals of care. SLP evaluation ordered   Severe hypovolemic hypernatremia secondary to dehydration from poor oral intake Free water deficit Patient presented with a sodium of 162 Patient currently getting IV fluids, with sodium later tonight.   Aspiration pneumonia in the setting of dysphagia present on admission Patient was started on IV antibiotics. SLP evalution.   Lactic acidosis Probably secondary to severe dehydration. Trend lactate level.   Acute kidney injury probably secondary to dehydration from poor oral intake Her baseline creatinine appears to be around 1 and she was admitted with a creatinine of 2.57 Creatinine with IV fluids around 2.2 UA is negative for leukocytes and nitrites with few bacteria.  Check renal  parameters in the morning.   Recent diagnosis of heart failure with reduced ejection fraction 25 to 30% by TTE done on July 26, 2023 Currently she appears hypovolemic and dehydrated Closely monitor strict intake and output and daily weights.   Hypertension Blood pressure parameters are improving   Type 2 diabetes mellitus with hyperglycemia. CBG (last 3)  Recent Labs    08/23/23 0416 08/23/23 0835 08/23/23 1114  GLUCAP 194* 160* 168*    Resume SSI.   Concerns of infiltrative cardiomyopathy seen on last 2D echo done on 07/26/2023 History of sinus bradycardia PACs [place]kebach During previous admission, per cardiology's note, given the patient's comorbidities and bedbound status she is not a candidate for aggressive care.    In view of her declining health, recurrent admissions, aspiration pneumonia , dysphagia, she has overall poor prognosis Palliative care consulted for goals of care   RN Pressure Injury Documentation: Pressure Injury 07/26/23 Sacrum Stage 2 -  Partial thickness loss of dermis presenting as a shallow open injury with a red, pink wound bed without slough. (Active)  07/26/23 0100  Location: Sacrum  Location Orientation:   Staging: Stage 2 -  Partial thickness loss of dermis presenting as a shallow open injury with  a red, pink wound bed without slough.  Wound Description (Comments):   Present on Admission: Yes  Dressing Type Foam - Lift dressing to assess site every shift 08/09/23 1215     Pressure Injury 07/27/23 Coccyx Mid Deep Tissue Pressure Injury - Purple or maroon localized area of discolored intact skin or blood-filled blister due to damage of underlying soft tissue from pressure and/or shear. 3 cm x 1 cm purple maroon discoloratio (Active)  07/27/23   Location: Coccyx  Location Orientation: Mid  Staging: Deep Tissue Pressure Injury - Purple or maroon localized area of discolored intact skin or blood-filled blister due to damage of underlying soft  tissue from pressure and/or shear.  Wound Description (Comments): 3 cm x 1 cm purple maroon discoloration skin intact  Present on Admission: Yes  Dressing Type Foam - Lift dressing to assess site every shift 08/09/23 1215   Estimated body mass index is 14.16 kg/m as calculated from the following:   Height as of 07/26/23: 5\' 6"  (1.676 m).   Weight as of 08/09/23: 39.8 kg.  Code Status: DNR limited.  DVT Prophylaxis:  enoxaparin (LOVENOX) injection 30 mg Start: 08/22/23 2000   Level of Care: Level of care: Stepdown Family Communication: Daughter at bedside  Disposition Plan:     Remains inpatient appropriate: IV fluids and antibiotics  Procedures:  None  Consultants:   Palliative  Antimicrobials:   Anti-infectives (From admission, onward)    Start     Dose/Rate Route Frequency Ordered Stop   08/23/23 1900  ceFEPIme (MAXIPIME) 1 g in sodium chloride 0.9 % 100 mL IVPB        1 g 200 mL/hr over 30 Minutes Intravenous Every 24 hours 08/22/23 1938     08/22/23 1645  ceFEPIme (MAXIPIME) 2 g in sodium chloride 0.9 % 100 mL IVPB        2 g 200 mL/hr over 30 Minutes Intravenous  Once 08/22/23 1629 08/22/23 1847   08/22/23 1630  vancomycin (VANCOCIN) IVPB 1000 mg/200 mL premix  Status:  Discontinued        1,000 mg 200 mL/hr over 60 Minutes Intravenous  Once 08/22/23 1624 08/22/23 1625   08/22/23 1630  vancomycin (VANCOREADY) IVPB 750 mg/150 mL        750 mg 150 mL/hr over 60 Minutes Intravenous  Once 08/22/23 1626 08/22/23 2015        Medications  Scheduled Meds:  enoxaparin (LOVENOX) injection  30 mg Subcutaneous Q24H   insulin aspart  0-9 Units Subcutaneous Q4H   Continuous Infusions:  ceFEPime (MAXIPIME) IV     dextrose 5 % and 0.45 % NaCl Stopped (08/23/23 1105)   PRN Meds:.acetaminophen, polyethylene glycol, prochlorperazine    Subjective:   Alicia Vang was seen and examined today.  Not following commands  Objective:   Vitals:   08/23/23 0701  08/23/23 0940 08/23/23 1024 08/23/23 1208  BP:  (!) 118/57 (!) 118/57   Pulse:   (!) 52   Resp:  14 16   Temp: 97.9 F (36.6 C)   (!) 94.9 F (34.9 C)  TempSrc: Axillary   Oral  SpO2:  92% 94%     Intake/Output Summary (Last 24 hours) at 08/23/2023 1415 Last data filed at 08/23/2023 1105 Gross per 24 hour  Intake 2650.12 ml  Output --  Net 2650.12 ml   There were no vitals filed for this visit.   Exam General exam: Ill-appearing elderly frail lady not responding to verbal commands Respiratory system: Diminished  air entry at bases, on room air Cardiovascular system: S1 & S2 heard, bradycardic, no JVD no pedal edema Gastrointestinal system: Abdomen is soft, nontender Central nervous system: Not responding to verbal commands Extremities: No edema Skin: No rashes,  Psychiatry: Unable to assess   Data Reviewed:  I have personally reviewed following labs and imaging studies   CBC Lab Results  Component Value Date   WBC 4.5 08/23/2023   RBC 4.21 08/23/2023   HGB 12.5 08/23/2023   HCT 42.8 08/23/2023   MCV 101.7 (H) 08/23/2023   MCH 29.7 08/23/2023   PLT 116 (L) 08/23/2023   MCHC 29.2 (L) 08/23/2023   RDW 16.7 (H) 08/23/2023   LYMPHSABS 0.8 08/22/2023   MONOABS 0.2 08/22/2023   EOSABS 0.0 08/22/2023   BASOSABS 0.0 08/22/2023     Last metabolic panel Lab Results  Component Value Date   NA 160 (H) 08/23/2023   K 3.5 08/23/2023   CL 124 (H) 08/23/2023   CO2 27 08/23/2023   BUN 88 (H) 08/23/2023   CREATININE 2.29 (H) 08/23/2023   GLUCOSE 252 (H) 08/23/2023   GFRNONAA 19 (L) 08/23/2023   GFRAA 59 (L) 10/28/2017   CALCIUM 8.9 08/23/2023   PHOS 3.7 08/23/2023   PROT 7.8 08/22/2023   ALBUMIN 3.7 08/22/2023   BILITOT 1.1 08/22/2023   ALKPHOS 48 08/22/2023   AST 20 08/22/2023   ALT 18 08/22/2023   ANIONGAP 9 08/23/2023    CBG (last 3)  Recent Labs    08/23/23 0416 08/23/23 0835 08/23/23 1114  GLUCAP 194* 160* 168*      Coagulation Profile: No  results for input(s): "INR", "PROTIME" in the last 168 hours.   Radiology Studies: CT CHEST ABDOMEN PELVIS WO CONTRAST  Result Date: 08/22/2023 CLINICAL DATA:  Sepsis, possible aspiration.  Unresponsive. EXAM: CT CHEST, ABDOMEN AND PELVIS WITHOUT CONTRAST TECHNIQUE: Multidetector CT imaging of the chest, abdomen and pelvis was performed following the standard protocol without IV contrast. RADIATION DOSE REDUCTION: This exam was performed according to the departmental dose-optimization program which includes automated exposure control, adjustment of the mA and/or kV according to patient size and/or use of iterative reconstruction technique. COMPARISON:  CT abdomen and pelvis 07/01/2022 FINDINGS: CT CHEST FINDINGS Cardiovascular: No significant vascular findings. Normal heart size. No pericardial effusion. Mediastinum/Nodes: No enlarged mediastinal, hilar, or axillary lymph nodes. Thyroid gland, trachea, and esophagus demonstrate no significant findings. Lungs/Pleura: There are calcified pleural plaques in both lung apices. Ill-defined patchy ground-glass and nodular ground glass opacities are seen in the right upper lobe, right middle lobe and right lower lobe. There is an air-fluid level in the right mainstem bronchus and occlusive plugging of bronchi throughout the right lower lobe. There is also some occlusive material within the left lower lobe bronchus centrally. There are patchy airspace opacities in the right lower lobe and minimally in the left lower lobe as well. Secretions are seen in the trachea. There is no pleural effusion or pneumothorax. Musculoskeletal: The bones are osteopenic. There are compression deformities of T5, T6 and T12 which are age indeterminate. CT ABDOMEN PELVIS FINDINGS Hepatobiliary: Hepatic cysts measuring up 2 3.4 cm appear unchanged. Gallbladder sludge is likely present. There is no biliary ductal dilatation allowing for lack of intravenous contrast. Pancreas: Not well  defined on this noncontrast study. Grossly within normal limits. Spleen: Normal in size without focal abnormality. Adrenals/Urinary Tract: Adrenal glands are unremarkable. Kidneys are normal, without renal calculi, focal lesion, or hydronephrosis. The bladder is not well seen secondary  to streak artifact in the pelvis. There are findings suspicious for large air-fluid level in the bladder. Stomach/Bowel: Limited evaluation for bowel pathology secondary to lack of oral and intravenous contrast. There is no evidence for bowel obstruction. No dilated bowel loops are seen. Colonic diverticula are present. The appendix is not well delineated on this study. Vascular/Lymphatic: There are atherosclerotic calcifications of the aorta. No definitive lymphadenopathy allowing for lack of intravenous contrast. Reproductive: Not well evaluated secondary to lack of contrast and streak artifact in the pelvis. Other: No abdominal wall hernia or abnormality. No abdominopelvic ascites. Musculoskeletal: The bones are osteopenic. Left hip arthroplasty is present. Right-sided hip screws are present. There is severe compression deformity of L3 and mild compression deformity of L1 similar to prior. IMPRESSION: 1. Air-fluid level in the right mainstem bronchus with occlusive plugging of bronchi throughout the right lower lobe. There is also some occlusive material in the left lower lobe bronchus. Findings are compatible with aspiration. 2. Patchy airspace opacities in the right lower lobe and minimally in the left lower lobe worrisome for pneumonia. 3. Ill-defined patchy ground-glass and nodular ground-glass opacities in the right lung worrisome for infection. 4. Calcified pleural plaques in both lung apices. 5. Colonic diverticulosis. 6. Large air-fluid level in the bladder worrisome for infection. 7. Multiple compression deformities in the thoracic spine, age indeterminate. Aortic Atherosclerosis (ICD10-I70.0). Electronically Signed   By:  Darliss Cheney M.D.   On: 08/22/2023 19:02   CT HEAD WO CONTRAST  Result Date: 08/22/2023 CLINICAL DATA:  Altered mental status EXAM: CT HEAD WITHOUT CONTRAST TECHNIQUE: Contiguous axial images were obtained from the base of the skull through the vertex without intravenous contrast. RADIATION DOSE REDUCTION: This exam was performed according to the departmental dose-optimization program which includes automated exposure control, adjustment of the mA and/or kV according to patient size and/or use of iterative reconstruction technique. COMPARISON:  07/01/2022 FINDINGS: Brain: No evidence of acute infarction, hemorrhage, mass, mass effect, or midline shift. No hydrocephalus or extra-axial fluid collection. Age related cerebral atrophy. Periventricular white matter changes, likely the sequela of chronic small vessel ischemic disease. Vascular: No hyperdense vessel. Skull: Negative for fracture or focal lesion. Sinuses/Orbits: No acute finding. Other: The mastoid air cells are well aerated. IMPRESSION: No acute intracranial process. Electronically Signed   By: Wiliam Ke M.D.   On: 08/22/2023 16:48   DG Chest Port 1 View  Result Date: 08/22/2023 CLINICAL DATA:  Altered level of consciousness. Possible aspiration. Unresponsive since noon. EXAM: PORTABLE CHEST 1 VIEW COMPARISON:  08/07/2023 FINDINGS: Heart size and pulmonary vascularity are normal. Emphysematous changes are suggested in the lungs. No airspace disease or consolidation. No pleural effusions. No pneumothorax. Mediastinal contours appear intact. Degenerative changes in the spine and shoulders. Compression deformity visualized at L1. Old healed left clavicular fracture. IMPRESSION: Emphysematous changes in the lungs. No evidence of active pulmonary disease. Electronically Signed   By: Burman Nieves M.D.   On: 08/22/2023 16:43       Kathlen Mody M.D. [facility] 08/23/2023, 2:15 PM  Available via Epic secure chat 7am-7pm After 7  pm, please refer to night coverage provider listed on amion.

## 2023-08-23 NOTE — Plan of Care (Signed)
  Problem: Education: Goal: Ability to describe self-care measures that may prevent or decrease complications (Diabetes Survival Skills Education) will improve Outcome: Not Progressing Goal: Individualized Educational Video(s) Outcome: Not Progressing   Problem: Fluid Volume: Goal: Ability to maintain a balanced intake and output will improve Outcome: Not Progressing   Problem: Health Behavior/Discharge Planning: Goal: Ability to identify and utilize available resources and services will improve Outcome: Not Progressing Goal: Ability to manage health-related needs will improve Outcome: Not Progressing   Problem: Nutritional: Goal: Maintenance of adequate nutrition will improve Outcome: Not Progressing Goal: Progress toward achieving an optimal weight will improve Outcome: Not Progressing   Problem: Tissue Perfusion: Goal: Adequacy of tissue perfusion will improve Outcome: Not Progressing   Problem: Education: Goal: Knowledge of General Education information will improve Description: Including pain rating scale, medication(s)/side effects and non-pharmacologic comfort measures Outcome: Not Progressing   Problem: Health Behavior/Discharge Planning: Goal: Ability to manage health-related needs will improve Outcome: Not Progressing   Problem: Clinical Measurements: Goal: Ability to maintain clinical measurements within normal limits will improve Outcome: Not Progressing

## 2023-08-23 NOTE — Evaluation (Addendum)
SLP Cancellation Note  Patient Details Name: Alicia Vang MRN: [id number] DOB: November 16, 1926   Cancelled treatment:       Reason Eval/Treat Not Completed: Other (comment);Fatigue/lethargy limiting ability to participate (Per paramedic, pt is currently lethargic and has been over the last several days *per daughter report to him. She has a h/o severe dysphagia just diagnosed earlier this month via MBS and was given a comfort diet.) Requested RN contact this SLP if pt awakens adequately for po.   Rolena Infante, MS [facility] SLP Acute Rehab Services Office [phone]   Chales Abrahams 08/23/2023, 8:20 AM

## 2023-08-23 NOTE — Progress Notes (Signed)
Bolus rate modified to protect the patency of IV. Dr. Blake Divine at bedside & notified.

## 2023-08-23 NOTE — Consult Note (Signed)
Consultation Note Date: 08/23/2023   Patient Name: Alicia Vang  DOB: [birth year]  MRN: [id number]  Age / Sex: 89 y.o., female  PCP: Knox Royalty, MD Referring Physician: Kathlen Mody, MD  Reason for Consultation: Establishing goals of care  HPI/Patient Profile: 89 y.o. female admitted on 08/22/2023    Clinical Assessment and Goals of Care: 89 year old lady, known to palliative medicine service, seen in last hospitalization October 2024, past medical history of newly diagnosed heart failure with reduced ejection fraction with surface echocardiogram done on 07-26-2023 showing ejection fraction 25-30%, history of sinus bradycardia, history of chronic kidney disease stage IIIb and a cognitive impairment and diabetes. Patient was recently discharged [facility] skilled SNF rehab. Patient admitted to hospital medicine service with the complaints of being less responsive than usual, with complaints of having stopped eating and having minimal oral intake. Patient was worked up in the emergency department and admitted to hospital medicine service, CODE STATUS was changed to DNR after discussion with ED providers.  Remains admitted to hospital medicine service for aspiration pneumonia CT scan with findings suggestive of aspiration, severe hypovolemic hypernatremia secondary to severe dehydration from poor oral intake, adult failure to thrive, cognitive impairment, ongoing dysphagia, concerns for recurrent aspiration. Patient currently n.p.o., seen and evaluated by speech therapist, in previous hospitalization was discharged with recommendation for dysphagia 1 pure and nectar thick liquid diet.  Remains on antibiotics, remains on IV fluid hydration to provide correction for hypernatremia. Palliative consultation to assist with establishing goals of care. Chart reviewed, patient seen and examined, call placed and  daughter [name] arrived at bedside and goals of care discussions took place-see below. Palliative medicine is specialized medical care for people living with serious illness. It focuses on providing relief from the symptoms and stress of a serious illness. The goal is to improve quality of life for both the patient and the family. Goals of care: Broad aims of medical therapy in relation to the patient's values and preferences. Our aim is to provide medical care aimed at enabling patients to achieve the goals that matter most to them, given the circumstances of their particular medical situation and their constraints.    HCPOA Daughter [name] at [phone]  SUMMARY OF RECOMMENDATIONS   Goals of care discussions: Brief life review performed.  Discussed with daughter [name].  Discussed about recent previous hospitalization in the first week of October 2024. Discussed about patient's multiple serious illnesses of failure to thrive, ongoing dysphagia, ongoing concern for recurrent aspiration, electrolyte abnormalities, worsening renal function, current aspiration pneumonia.  Goals wishes and values attempted to be explored.  Differences between hospice and palliative explained.  Concept specific to artificial nutrition hydration pros and cons of artificial feeding tubes temporary or permanent discussed in detail. Plan: Patient's daughter wishes to continue current mode of care, she hopes for some degree of stabilization/recovery.  She hopes that patient will be awake alert enough so that some type of safest possible oral diet will be able to be resumed soon. Discussed about time-limited trial of  current interventions for the next 2 to 3 days.  If the patient does not have a significant recovery, would recommend comfort measures and transfer to residential hospice.  If the patient has some degree of stabilization, will need further goals of care discussions of whether continuing SNF rehab trial is  appropriate in this situation or not. Daughter wishes to discuss with her siblings, patient's 2 sons, some of her children live out of town and are going to arrive in town this weekend.  Palliative care to monitor hospital course and continue broad goals of care discussions.  Code Status/Advance Care Planning: DNR   Symptom Management:     Palliative Prophylaxis:  Delirium Protocol   Psycho-social/Spiritual:  Desire for further Chaplaincy support:yes Additional Recommendations: Caregiving  Support/Resources  Prognosis:  Unable to determine  Discharge Planning: To Be Determined      Primary Diagnoses: Present on Admission:  Adult failure to thrive   I have reviewed the medical record, interviewed the patient and family, and examined the patient. The following aspects are pertinent.  Past Medical History:  Diagnosis Date   Arthritis    Diabetes mellitus without complication (HCC)    Type II   Hypertension    Hypokalemia    Social History   Socioeconomic History   Marital status: Widowed    Spouse name: Not on file   Number of children: Not on file   Years of education: Not on file   Highest education level: Not on file  Occupational History   Not on file  Tobacco Use   Smoking status: Never   Smokeless tobacco: Never  Vaping Use   Vaping status: Never Used  Substance and Sexual Activity   Alcohol use: No   Drug use: No   Sexual activity: Not on file  Other Topics Concern   Not on file  Social History Narrative   Not on file   Social Determinants of Health   Financial Resource Strain: Not on file  Food Insecurity: No Food Insecurity (07/26/2023)   Hunger Vital Sign    Worried About Running Out of Food in the Last Year: Never true    Ran Out of Food in the Last Year: Never true  Transportation Needs: No Transportation Needs (07/26/2023)   PRAPARE - Administrator, Civil Service (Medical): No    Lack of Transportation (Non-Medical): No   Physical Activity: Not on file  Stress: Not on file  Social Connections: Not on file   Family History  Problem Relation Age of Onset   Stroke Mother    Hypertension Mother    Cataracts Mother    Cataracts Father    Heart attack Neg Hx    Scheduled Meds:  enoxaparin (LOVENOX) injection  30 mg Subcutaneous Q24H   insulin aspart  0-9 Units Subcutaneous Q4H   Continuous Infusions:  ceFEPime (MAXIPIME) IV     dextrose 5 % and 0.45 % NaCl Stopped (08/23/23 1105)   PRN Meds:.acetaminophen, polyethylene glycol, prochlorperazine Medications Prior to Admission:  Prior to Admission medications   Medication Sig Start Date End Date Taking? Authorizing Provider  acetaminophen (TYLENOL) 325 MG tablet Take 2 tablets (650 mg total) by mouth every 6 (six) hours as needed for mild pain or headache (fever >/= 101). 10/24/20  Yes Lonia Blood, MD  brinzolamide (AZOPT) 1 % ophthalmic suspension Place 1 drop into both eyes at bedtime. 03/10/21  Yes [provider]  diclofenac Sodium (VOLTAREN) 1 % GEL Apply 2  g topically 4 (four) times daily as needed (lower back pain as needed). 06/06/21  Yes Zigmund Daniel., MD  docusate sodium (COLACE) 100 MG capsule Take 1 capsule (100 mg total) by mouth 2 (two) times daily. 07/06/22  Yes Narda Bonds, MD  furosemide (LASIX) 20 MG tablet Take 1 tablet (20 mg total) by mouth daily. 08/09/23 09/08/23 Yes Arrien, York Ram, MD  latanoprost (XALATAN) 0.005 % ophthalmic solution Place 1 drop into both eyes at bedtime.   Yes [provider]  losartan (COZAAR) 100 MG tablet Take 0.5 tablets (50 mg total) by mouth daily. 08/09/23  Yes Arrien, York Ram, MD  polyethylene glycol (MIRALAX / GLYCOLAX) 17 g packet Take 17 g by mouth daily as needed for mild constipation. 07/06/22  Yes Narda Bonds, MD  spironolactone (ALDACTONE) 25 MG tablet Take 0.5 tablets (12.5 mg total) by mouth daily. 08/01/23  Yes Glade Lloyd, MD  tuberculin  (APLISOL) 5 UNIT/0.1ML injection Inject 0.1 mLs into the skin once.   Yes [provider]  Vitamin D, Ergocalciferol, (DRISDOL) 1.25 MG (50000 UNIT) CAPS capsule Take 50,000 Units by mouth once a week. 04/29/22  Yes [provider]  XIIDRA 5 % SOLN Place 1 drop into both eyes at bedtime. 07/26/20  Yes [provider]  XARELTO 15 MG TABS tablet Take 15 mg by mouth daily. Patient not taking: Reported on 06/01/2021 03/17/21 06/01/21  [provider]   Allergies  Allergen Reactions   Amlodipine Swelling and Other (See Comments)    Swelling in feet   Review of Systems +weakness Physical Exam Elderly appearing lady resting in bed Frail, appears chronically ill, appears with generalized weakness Is on a Bair hugger Is on supplemental oxygen via nasal cannula at 2 L  Vital Signs: BP (!) 118/57 (BP Location: Right Arm)   Pulse (!) 52   Temp (!) 94.9 F (34.9 C) (Oral)   Resp 16   SpO2 94%  Pain Scale: PAINAD       SpO2: SpO2: 94 % O2 Device:SpO2: 94 % O2 Flow Rate: .   IO: Intake/output summary:  Intake/Output Summary (Last 24 hours) at 08/23/2023 1341 Last data filed at 08/23/2023 1105 Gross per 24 hour  Intake 2650.12 ml  Output --  Net 2650.12 ml    LBM:   Baseline Weight:   Most recent weight:       Palliative Assessment/Data:   PPS 40%  Time In:  12.30 Time Out:  1330 Time Total:  60  Greater than 50%  of this time was spent counseling and coordinating care related to the above assessment and plan.  Signed by: Rosalin Hawking, MD   Please contact Palliative Medicine Team phone at [phone] for questions and concerns.  For individual provider: See Loretha Stapler

## 2023-08-24 DIAGNOSIS — J189 Pneumonia, unspecified organism: Secondary | ICD-10-CM | POA: Diagnosis not present

## 2023-08-24 DIAGNOSIS — E870 Hyperosmolality and hypernatremia: Secondary | ICD-10-CM | POA: Diagnosis not present

## 2023-08-24 DIAGNOSIS — N179 Acute kidney failure, unspecified: Secondary | ICD-10-CM | POA: Diagnosis not present

## 2023-08-24 DIAGNOSIS — R627 Adult failure to thrive: Secondary | ICD-10-CM | POA: Diagnosis not present

## 2023-08-24 LAB — CBC WITH DIFFERENTIAL/PLATELET
Abs Immature Granulocytes: 0.03 10*3/uL (ref 0.00–0.07)
Basophils Absolute: 0 10*3/uL (ref 0.0–0.1)
Basophils Relative: 0 %
Eosinophils Absolute: 0 10*3/uL (ref 0.0–0.5)
Eosinophils Relative: 0 %
HCT: 35.5 % — ABNORMAL LOW (ref 36.0–46.0)
Hemoglobin: 10.6 g/dL — ABNORMAL LOW (ref 12.0–15.0)
Immature Granulocytes: 1 %
Lymphocytes Relative: 10 %
Lymphs Abs: 0.4 10*3/uL — ABNORMAL LOW (ref 0.7–4.0)
MCH: 29.9 pg (ref 26.0–34.0)
MCHC: 29.9 g/dL — ABNORMAL LOW (ref 30.0–36.0)
MCV: 100.3 fL — ABNORMAL HIGH (ref 80.0–100.0)
Monocytes Absolute: 0.2 10*3/uL (ref 0.1–1.0)
Monocytes Relative: 4 %
Neutro Abs: 3.3 10*3/uL (ref 1.7–7.7)
Neutrophils Relative %: 85 %
Platelets: 102 10*3/uL — ABNORMAL LOW (ref 150–400)
RBC: 3.54 MIL/uL — ABNORMAL LOW (ref 3.87–5.11)
RDW: 16.1 % — ABNORMAL HIGH (ref 11.5–15.5)
WBC: 3.8 10*3/uL — ABNORMAL LOW (ref 4.0–10.5)
nRBC: 0 % (ref 0.0–0.2)

## 2023-08-24 LAB — TSH: TSH: 0.587 u[IU]/mL (ref 0.350–4.500)

## 2023-08-24 LAB — GLUCOSE, CAPILLARY
Glucose-Capillary: 117 mg/dL — ABNORMAL HIGH (ref 70–99)
Glucose-Capillary: 149 mg/dL — ABNORMAL HIGH (ref 70–99)
Glucose-Capillary: 152 mg/dL — ABNORMAL HIGH (ref 70–99)

## 2023-08-24 LAB — BASIC METABOLIC PANEL
Anion gap: 3 — ABNORMAL LOW (ref 5–15)
BUN: 77 mg/dL — ABNORMAL HIGH (ref 8–23)
CO2: 23 mmol/L (ref 22–32)
Calcium: 8.5 mg/dL — ABNORMAL LOW (ref 8.9–10.3)
Chloride: 128 mmol/L — ABNORMAL HIGH (ref 98–111)
Creatinine, Ser: 1.67 mg/dL — ABNORMAL HIGH (ref 0.44–1.00)
GFR, Estimated: 28 mL/min — ABNORMAL LOW (ref >60)
Glucose, Bld: 170 mg/dL — ABNORMAL HIGH (ref 70–99)
Potassium: 3.4 mmol/L — ABNORMAL LOW (ref 3.5–5.1)
Sodium: 154 mmol/L — ABNORMAL HIGH (ref 135–145)

## 2023-08-24 LAB — LACTIC ACID, PLASMA
Lactic Acid, Venous: 1.6 mmol/L (ref 0.5–1.9)
Lactic Acid, Venous: 1.7 mmol/L (ref 0.5–1.9)

## 2023-08-24 LAB — VITAMIN B12: Vitamin B-12: 669 pg/mL (ref 180–914)

## 2023-08-24 MED ORDER — SODIUM CHLORIDE 0.9% FLUSH
3.0000 mL | Freq: Two times a day (BID) | INTRAVENOUS | Status: DC
  Administered 2023-08-24 – 2023-08-25 (×2): 3 mL via INTRAVENOUS

## 2023-08-24 MED ORDER — MORPHINE SULFATE (PF) 2 MG/ML IV SOLN
1.0000 mg | INTRAVENOUS | Status: DC | PRN
  Filled 2023-08-24: qty 1

## 2023-08-24 MED ORDER — DEXTROSE IN LACTATED RINGERS 5 % IV SOLN: INTRAVENOUS | Status: DC

## 2023-08-24 MED ORDER — GLYCOPYRROLATE 0.2 MG/ML IJ SOLN
0.2000 mg | INTRAMUSCULAR | Status: DC | PRN
  Administered 2023-08-24: 0.2 mg via INTRAVENOUS
  Filled 2023-08-24: qty 1

## 2023-08-24 MED ORDER — POTASSIUM CHLORIDE 10 MEQ/100ML IV SOLN
10.0000 meq | INTRAVENOUS | Status: AC
  Administered 2023-08-24 (×2): 10 meq via INTRAVENOUS
  Filled 2023-08-24 (×2): qty 100

## 2023-08-24 NOTE — Progress Notes (Addendum)
Discussed assessment findings with family at bedside (patient's daughter and two of her sons); patient is hypotensive, has a heated blanket as she was hypothermic, lethargic, unable to follow commands, SB at times, cardiac changes (vent. bigeminy), unable to swallow safely with sporadic breath sounds. Family asked if palliative care was going to come back to bedside.   Reached out to Dr. Blake Divine and Dr. Linna Darner for robinul/scopalamine and to see if palliative could see patient/family this AM. Per Dr. Linna Darner she is not here today but palliative will see her.   Dr. Linna Darner at bedside but family not available at this time. Per Dr. Linna Darner will return when family back at bedside.

## 2023-08-24 NOTE — TOC Initial Note (Addendum)
Transition of Care [facility]) - Initial/Assessment Note    Patient Details  Name: Alicia Vang MRN: [id number] Date of Birth: 29-Dec-1926  Transition [facility] CM/SW Contact:    Lavenia Atlas, RN Phone Number: 08/24/2023, 1:36 PM  Clinical Narrative:   Per chart review patient is from [facility] and currently in [facility] SDU for failure to thrive. Received TOC consult for residential hospice as palliative is following. This RNCM spoke with patient's family at bedside to offer choice, provided Medicare.gov list. Patient's daughter [name] chose [facility]. This RNCM notified Shawn with [facility] who will review and notify of potential acceptance and  bed availability. Notified RN.   - 5:35pm This RNCM received message from Shawn w/[facility] who reports no residential hospice beds available at [facility] today. Shawn offered bed at [facility] in [place], awaiting patient's family decision.    TOC will continue to follow.                Expected Discharge Plan: Hospice Medical Facility Barriers to Discharge: Continued Medical Work up   Patient Goals and CMS Choice Patient states their goals for this hospitalization and ongoing recovery are:: to feel better/ comfort care CMS Medicare.gov Compare Post Acute Care list provided to:: Patient Represenative (must comment) (Daughter: Emergency planning/management officer) Choice offered to / list presented to : Adult Children [place] ownership interest in [facility].provided to:: Adult Children    Expected Discharge Plan and Services In-house Referral: Hospice / Palliative Care, Chaplain Discharge Planning Services: CM Consult Post Acute Care Choice: Hospice Living arrangements for the past 2 months: Skilled Nursing Facility                 DME Arranged: N/A DME Agency: NA       HH Arranged: NA HH Agency: NA        Prior Living Arrangements/Services Living arrangements for the past 2 months: Skilled Nursing Facility Lives with::  Facility Resident Patient language and need for interpreter reviewed:: Yes Do you feel safe going back to the place where you live?: Yes      Need for Family Participation in Patient Care: Yes (Comment) Care giver support system in place?: Yes (comment)   Criminal Activity/Legal Involvement Pertinent to Current Situation/Hospitalization: No - Comment as needed  Activities of Daily Living   ADL Screening (condition at time of admission) Independently performs ADLs?: No Does the patient have a NEW difficulty with bathing/dressing/toileting/self-feeding that is expected to last >3 days?: No Does the patient have a NEW difficulty with getting in/out of bed, walking, or climbing stairs that is expected to last >3 days?: No Does the patient have a NEW difficulty with communication that is expected to last >3 days?: Yes (Initiates electronic notice to provider for possible SLP consult) Is the patient deaf or have difficulty hearing?: No Does the patient have difficulty seeing, even when wearing glasses/contacts?: Yes Does the patient have difficulty concentrating, remembering, or making decisions?: Yes  Permission Sought/Granted Permission sought to share information with : Case Manager Permission granted to share information with : Yes, Verbal Permission Granted  Share Information with NAME: Case manager           Emotional Assessment Appearance:: Appears stated age Attitude/Demeanor/Rapport: Unable to Assess Affect (typically observed): Unable to Assess Orientation: :  (UTA) Alcohol / Substance Use: Not Applicable Psych Involvement: No (comment)  Admission diagnosis:  Adult failure to thrive [R62.7] Hypernatremia [E87.0] AKI (acute kidney injury) (HCC) [N17.9] HCAP (healthcare-associated  pneumonia) [J18.9] Sepsis, due to unspecified organism, unspecified whether acute organ dysfunction present [facility]) [A41.9] Patient Active Problem List   Diagnosis Date Noted   Adult failure to  thrive 08/22/2023   At risk for deficient intake of food/liquids 08/07/2023   Acute hypoxic respiratory failure (HCC) 08/01/2023   Pressure injury of skin 07/30/2023   Protein-calorie malnutrition, severe 07/28/2023   Acute on chronic systolic CHF (congestive heart failure) (HCC) 07/27/2023   Dilated cardiomyopathy (HCC) 07/27/2023   Pleural effusion on left 07/27/2023   Chronic kidney disease, stage 3a (HCC) 07/05/2022   Hypoalbuminemia 07/05/2022   Closed displaced fracture of right femoral neck (HCC) 07/01/2022   Traumatic intraventricular hemorrhage (HCC) 07/01/2022   Closed compression fracture of L3 lumbar vertebra, with delayed healing, subsequent encounter 06/01/2021   Parkinson's disease (HCC) 06/01/2021    Class: Question of   Primary hypertension 06/01/2021   COVID-19 virus infection 10/21/2020   Bradycardia 10/21/2020   Type 2 diabetes mellitus without complication, without long-term current use of insulin (HCC) 10/21/2020   Primary osteoarthritis of left hip 10/26/2017   Osteoarthritis of left hip 08/17/2017   PCP:  Eloisa Northern, MD Pharmacy:  No Pharmacies Listed    Social Determinants of Health (SDOH) Social History: SDOH Screenings   Food Insecurity: No Food Insecurity (08/23/2023)  Housing: Low Risk  (08/23/2023)  Transportation Needs: No Transportation Needs (08/23/2023)  Utilities: Not At Risk (08/23/2023)  Tobacco Use: Low Risk  (07/25/2023)   SDOH Interventions:     Readmission Risk Interventions    08/24/2023    1:31 PM  Readmission Risk Prevention Plan  Transportation Screening Complete  PCP or Specialist Appt within 3-5 Days Complete  HRI or Home Care Consult Complete  Social Work Consult for Recovery Care Planning/Counseling Complete  Palliative Care Screening Complete  Medication Review Oceanographer) Complete

## 2023-08-24 NOTE — Progress Notes (Signed)
PT Cancellation Note  Patient Details Name: Alicia Vang MRN: [id number] DOB: Apr 15, 1927   Cancelled Treatment:    Reason Eval/Treat Not Completed: Medical issues which prohibited therapy;Fatigue/lethargy limiting ability to participate, noted BP low, Palliative Medicine is consulted for GOC. Blanchard Kelch PT Acute Rehabilitation Services Office [phone] Weekend pager-[phone]   Rada Hay 08/24/2023, 7:45 AM

## 2023-08-24 NOTE — Progress Notes (Addendum)
OT Cancellation Note  Patient Details Name: JAEDA BRUSO MRN: [id number] DOB: 03/03/1927   Cancelled Treatment:    Reason Eval/Treat Not Completed: Medical issues which prohibited therapy. Per pt's chart, pt is with current hypothermia, hypotension, and sporadic breath sounds. Pending possible palliative consult for goals of care.    Reuben Likes, OTR/L 08/24/2023, 9:04 AM  Rolena Infante, MS [facility] SLP Acute Rehab Services Office [phone]

## 2023-08-24 NOTE — Progress Notes (Signed)
Daily Progress Note   Patient Name: Alicia Vang       Date: 08/24/2023 DOB: May 11, 1927  Age: 89 y.o. MRN#: [id number] Attending Physician: Kathlen Mody, MD Primary Care Physician: Eloisa Northern, MD Admit Date: 08/22/2023  Reason for Consultation/Follow-up: Establishing goals of care  Subjective: Patient essentially unresponsive, respirations with mandibular movement, eyes semiopen, monitor noted  Length of Stay: 2  Current Medications: Scheduled Meds:   Chlorhexidine Gluconate Cloth  6 each Topical Daily   mouth rinse  15 mL Mouth Rinse 4 times per day   sodium chloride flush  3 mL Intravenous Q12H    Continuous Infusions:   PRN Meds: acetaminophen, glycopyrrolate, morphine injection, mouth rinse, polyethylene glycol, prochlorperazine  Physical Exam         Frail elderly lady Monitor noted Possibly actively dying Respirations with mandibular movement Does not open eyes does not follow commands  Vital Signs: BP 116/79 (BP Location: Right Arm)   Pulse (!) 142   Temp 97.7 F (36.5 C) (Axillary)   Resp (!) 25   Ht 5\' 6"  (1.676 m)   Wt 34.5 kg   SpO2 97%   BMI 12.28 kg/m  SpO2: SpO2: 97 % O2 Device: O2 Device: Room Air O2 Flow Rate: O2 Flow Rate (L/min): 4 L/min  Intake/output summary:  Intake/Output Summary (Last 24 hours) at 08/24/2023 1259 Last data filed at 08/24/2023 1232 Gross per 24 hour  Intake 1226.97 ml  Output 300 ml  Net 926.97 ml   LBM:   Baseline Weight: Weight: 34.5 kg Most recent weight: Weight: 34.5 kg       Palliative Assessment/Data:      Patient Active Problem List   Diagnosis Date Noted   Adult failure to thrive 08/22/2023   At risk for deficient intake of food/liquids 08/07/2023   Acute hypoxic respiratory failure (HCC)  08/01/2023   Pressure injury of skin 07/30/2023   Protein-calorie malnutrition, severe 07/28/2023   Acute on chronic systolic CHF (congestive heart failure) (HCC) 07/27/2023   Dilated cardiomyopathy (HCC) 07/27/2023   Pleural effusion on left 07/27/2023   Chronic kidney disease, stage 3a (HCC) 07/05/2022   Hypoalbuminemia 07/05/2022   Closed displaced fracture of right femoral neck (HCC) 07/01/2022   Traumatic intraventricular hemorrhage (HCC) 07/01/2022   Closed compression fracture of L3 lumbar vertebra, with delayed  healing, subsequent encounter 06/01/2021   Parkinson's disease (HCC) 06/01/2021   Primary hypertension 06/01/2021   COVID-19 virus infection 10/21/2020   Bradycardia 10/21/2020   Type 2 diabetes mellitus without complication, without long-term current use of insulin (HCC) 10/21/2020   Primary osteoarthritis of left hip 10/26/2017   Osteoarthritis of left hip 08/17/2017    Palliative Care Assessment & Plan   Patient Profile:    Assessment: Heart failure with reduced ejection fraction 25-30% History of sinus bradycardia History of hypertension CKD 3B diabetes cognitive impairment Admitted from skilled nursing facility with decreased responsiveness, admitted to hospital medicine service for possible aspiration pneumonia in the setting of dysphagia, hospital course also complicated by electrolyte abnormalities severe hypovolemic hypernatremia deemed secondary to dehydration from poor oral intake.  Also with lactic acidosis and acute kidney injury.  Recommendations/Plan: Goals of care discussion/family meeting: I arrived at the bedside, discussed earlier with nursing colleague.  Patient appears actively dying.  Daughter [name] and 2 sons present at bedside.  Nephew who is in pastoral care also present at bedside.  Discussed frankly but compassionately about patient's current appearance and end-of-life signs and symptoms that the patient is displaying.  Recommended  consideration for establishment of full scope of comfort measures and seeking hospice support-residential hospice.  Patient's family in agreement.  Will place [facility] consult.  Will request inpatient spiritual care for additional support.  Prognosis appears markedly limited to hours to some very limited number of days.  Goals of Care and Additional Recommendations: Limitations on Scope of Treatment: Full Comfort Care  Code Status: DNR DNI    Code Status Orders  (From admission, onward)           Start     Ordered   08/22/23 1937  Do not attempt resuscitation (DNR)- Limited -Do Not Intubate (DNI)  Continuous       Question Answer Comment  If pulseless and not breathing No CPR or chest compressions.   In Pre-Arrest Conditions (Patient Is Breathing and Has A Pulse) Do not intubate. Provide all appropriate non-invasive medical interventions. Avoid ICU transfer unless indicated or required.   Consent: Discussion documented in EHR or advanced directives reviewed      08/22/23 1936           Code Status History     Date Active Date Inactive Code Status Order ID Comments User Context   08/22/2023 1934 08/22/2023 1936 Limited: Do not attempt resuscitation (DNR) -DNR-LIMITED -Do Not Intubate/DNI  [id number]  Charlynne Pander, MD ED   08/09/2023 1255 08/09/2023 2153 Limited: Do not attempt resuscitation (DNR) -DNR-LIMITED -Do Not Intubate/DNI  [id number]  Coralie Keens, MD Inpatient   07/25/2023 2344 08/09/2023 1255 Full Code [id number]  Gery Pray, MD ED   07/25/2023 2312 07/25/2023 2344 Full Code [id number]  Gery Pray, MD ED   07/02/2022 0031 07/06/2022 2341 Full Code [id number]  Marinda Elk, MD ED   06/01/2021 0528 06/07/2021 0310 Full Code [id number]  Hillary Bow, DO ED   10/21/2020 0335 10/24/2020 1943 Full Code [id number]  John Giovanni, MD ED   10/26/2017 1558 10/29/2017 2309 Full Code [id number]  Allena Katz, PA-C Inpatient      Advance Directive  Documentation    Flowsheet Row Most Recent Value  Type of Advance Directive Healthcare Power of Attorney  Pre-existing out of facility DNR order (yellow form or pink MOST form) --  "MOST" Form in Place? --       Prognosis:  Hours - Days  Discharge Planning: Hospice facility  Care plan was discussed with bedside RN colleague, TRH MD, daughter and 2 sons present at the bedside.  Thank you for allowing the Palliative Medicine Team to assist in the care of this patient. High MDM     Greater than 50%  of this time was spent counseling and coordinating care related to the above assessment and plan.  Rosalin Hawking, MD  Please contact Palliative Medicine Team phone at [phone] for questions and concerns.

## 2023-08-24 NOTE — Plan of Care (Signed)
  Problem: Coping: Goal: Ability to adjust to condition or change in health will improve Outcome: Progressing   Problem: Coping: Goal: Level of anxiety will decrease Outcome: Progressing   Problem: Elimination: Goal: Will not experience complications related to bowel motility Outcome: Progressing Goal: Will not experience complications related to urinary retention Outcome: Progressing   Problem: Pain Management: Goal: General experience of comfort will improve Outcome: Progressing   Problem: Safety: Goal: Ability to remain free from injury will improve Outcome: Progressing

## 2023-08-24 NOTE — Progress Notes (Signed)
[facility]                                                                               Alicia Vang, is a 89 y.o. female, DOB - [birth year], NWG:[id number] Admit date - 08/22/2023    Outpatient Primary MD for the patient is Alicia Northern, MD  LOS - 2  days    Brief summary    Alicia Vang is a 89 y.o. female with medical history significant for newly diagnosed HFrEF 25 to 30% (2D echo 07/26/23), concerns for infiltrative cardiomyopathy from last TTE, history of sinus bradycardia, hypertension, CKD 3B, DM2, cognitive impairment, who presents to the ED from SNF due to decreasing responsiveness for the past 2 days.  The patient was recently admitted for acute HFrEF and discharged on 08/09/2023 to SNF.  Reportedly, since her discharge from the hospital she has had minimal food intake.  Nursing home staff noticed that for the past 2 days she has been less responsive than usual and stopped eating.  She was brought into the ED for further evaluation.   Assessment & Plan    Assessment and Plan:   Failure to thrive probably secondary to progressive cognitive impairment, dysphagia and dehydration. Palliative care consulted to establish goals of care. She's transitioned to comfort measures.    Severe hypovolemic hypernatremia secondary to dehydration from poor oral intake Free water deficit Patient presented with a sodium of 162, improved to 154   Aspiration pneumonia in the setting of dysphagia present on admission Patient was started on IV antibiotics, later on discontinued. SLP evalution.   Lactic acidosis Probably secondary to severe dehydration. Lactic acid wnl.   Acute kidney injury probably secondary to dehydration from poor oral intake Her baseline creatinine appears to be around 1 and she was admitted with a creatinine of 2.57, improved to 1.67 UA is negative for leukocytes and nitrites with few bacteria.     Recent diagnosis of heart failure  with reduced ejection fraction 25 to 30% by TTE done on July 26, 2023.  Hypotension - not responding to fluids.    Type 2 diabetes mellitus with hyperglycemia. CBG (last 3)  Recent Labs    08/24/23 0043 08/24/23 0339 08/24/23 0843  GLUCAP 152* 149* 117*      Concerns of infiltrative cardiomyopathy seen on last 2D echo done on 07/26/2023 History of sinus bradycardia PACs Wenckebach During previous admission, per cardiology's note, given the patient's comorbidities and bedbound status she is not a candidate for aggressive care.    In view of her declining health, recurrent admissions, aspiration pneumonia , dysphagia, she has overall poor prognosis Palliative care consulted for goals of care, transitioned to comfort measures.    RN Pressure Injury Documentation: Pressure Injury 08/23/23 Sacrum Mid;Medial Deep Tissue Pressure Injury - Purple or maroon localized area of discolored intact skin or blood-filled blister due to damage of underlying soft tissue from pressure and/or shear. red, purple (Active)  08/23/23 1716  Location: Sacrum  Location Orientation: Mid;Medial  Staging: Deep Tissue Pressure Injury - Purple or maroon localized area of discolored intact skin or blood-filled blister due to damage of underlying soft tissue from pressure and/or shear.  Wound Description (Comments): red, purple  Present on Admission: Yes  Dressing Type Foam - Lift dressing to assess site every shift 08/23/23 2000     Pressure Injury 08/23/23 Ankle Posterior;Right;Lateral Stage 2 -  Partial thickness loss of dermis presenting as a shallow open injury with a red, pink wound bed without slough. red (Active)  08/23/23 1716  Location: Ankle  Location Orientation: Posterior;Right;Lateral  Staging: Stage 2 -  Partial thickness loss of dermis presenting as a shallow open injury with a red, pink wound bed without slough.  Wound Description (Comments): red  Present on Admission: Yes  Dressing Type  Foam - Lift dressing to assess site every shift 08/23/23 2000   Estimated body mass index is 12.28 kg/m as calculated from the following:   Height as of this encounter: 5\' 6"  (1.676 m).   Weight as of this encounter: 34.5 kg.  Code Status: DNR limited.  DVT Prophylaxis:     Level of Care: Level of care: Stepdown Family Communication: Daughter at bedside  Disposition Plan:     Remains inpatient appropriate:   Procedures:  None  Consultants:   Palliative  Antimicrobials:   Anti-infectives (From admission, onward)    Start     Dose/Rate Route Frequency Ordered Stop   08/23/23 1900  ceFEPIme (MAXIPIME) 1 g in sodium chloride 0.9 % 100 mL IVPB  Status:  Discontinued        1 g 200 mL/hr over 30 Minutes Intravenous Every 24 hours 08/22/23 1938 08/24/23 1204   08/22/23 1645  ceFEPIme (MAXIPIME) 2 g in sodium chloride 0.9 % 100 mL IVPB        2 g 200 mL/hr over 30 Minutes Intravenous  Once 08/22/23 1629 08/22/23 1847   08/22/23 1630  vancomycin (VANCOCIN) IVPB 1000 mg/200 mL premix  Status:  Discontinued        1,000 mg 200 mL/hr over 60 Minutes Intravenous  Once 08/22/23 1624 08/22/23 1625   08/22/23 1630  vancomycin (VANCOREADY) IVPB 750 mg/150 mL        750 mg 150 mL/hr over 60 Minutes Intravenous  Once 08/22/23 1626 08/22/23 2015        Medications  Scheduled Meds:  Chlorhexidine Gluconate Cloth  6 each Topical Daily   mouth rinse  15 mL Mouth Rinse 4 times per day   sodium chloride flush  3 mL Intravenous Q12H   Continuous Infusions:   PRN Meds:.acetaminophen, glycopyrrolate, morphine injection, mouth rinse, polyethylene glycol, prochlorperazine    Subjective:   Alicia Vang was seen and examined today.  Not responding to fluids.   Objective:   Vitals:   08/24/23 0900 08/24/23 1000 08/24/23 1100 08/24/23 1200  BP: (!) 111/59 (!) 91/56 (!) 104/57 116/79  Pulse: 73 69 87 (!) 142  Resp: (!) 21 (!) 22 (!) 26 (!) 25  Temp:      TempSrc:      SpO2:  99% 100% (!) 89% 97%  Weight:      Height:        Intake/Output Summary (Last 24 hours) at 08/24/2023 1539 Last data filed at 08/24/2023 1232 Gross per 24 hour  Intake 1226.97 ml  Output 300 ml  Net 926.97 ml   Filed Weights   08/23/23 1550 08/23/23 1600  Weight: 34.5 kg 34.5 kg     Exam General exam: Ill appearing LADY, Totally unresponsive.  Respiratory system: diminished air entry.on [place] oxygen.  Cardiovascular system: S1 & S2 heard, RRR.  Gastrointestinal system: Abdomen is  nondistended, soft and nontender.  Central nervous system: not following commands.  Extremities: no pedal edema.  Skin: No rashes,      Data Reviewed:  I have personally reviewed following labs and imaging studies   CBC Lab Results  Component Value Date   WBC 3.8 (L) 08/24/2023   RBC 3.54 (L) 08/24/2023   HGB 10.6 (L) 08/24/2023   HCT 35.5 (L) 08/24/2023   MCV 100.3 (H) 08/24/2023   MCH 29.9 08/24/2023   PLT 102 (L) 08/24/2023   MCHC 29.9 (L) 08/24/2023   RDW 16.1 (H) 08/24/2023   LYMPHSABS 0.4 (L) 08/24/2023   MONOABS 0.2 08/24/2023   EOSABS 0.0 08/24/2023   BASOSABS 0.0 08/24/2023     Last metabolic panel Lab Results  Component Value Date   NA 154 (H) 08/24/2023   K 3.4 (L) 08/24/2023   CL 128 (H) 08/24/2023   CO2 23 08/24/2023   BUN 77 (H) 08/24/2023   CREATININE 1.67 (H) 08/24/2023   GLUCOSE 170 (H) 08/24/2023   GFRNONAA 28 (L) 08/24/2023   GFRAA 59 (L) 10/28/2017   CALCIUM 8.5 (L) 08/24/2023   PHOS 3.7 08/23/2023   PROT 7.8 08/22/2023   ALBUMIN 3.7 08/22/2023   BILITOT 1.1 08/22/2023   ALKPHOS 48 08/22/2023   AST 20 08/22/2023   ALT 18 08/22/2023   ANIONGAP 3 (L) 08/24/2023    CBG (last 3)  Recent Labs    08/24/23 0043 08/24/23 0339 08/24/23 0843  GLUCAP 152* 149* 117*      Coagulation Profile: No results for input(s): "INR", "PROTIME" in the last 168 hours.   Radiology Studies: CT CHEST ABDOMEN PELVIS WO CONTRAST  Result Date: 08/22/2023 CLINICAL  DATA:  Sepsis, possible aspiration.  Unresponsive. EXAM: CT CHEST, ABDOMEN AND PELVIS WITHOUT CONTRAST TECHNIQUE: Multidetector CT imaging of the chest, abdomen and pelvis was performed following the standard protocol without IV contrast. RADIATION DOSE REDUCTION: This exam was performed according to the departmental dose-optimization program which includes automated exposure control, adjustment of the mA and/or kV according to patient size and/or use of iterative reconstruction technique. COMPARISON:  CT abdomen and pelvis 07/01/2022 FINDINGS: CT CHEST FINDINGS Cardiovascular: No significant vascular findings. Normal heart size. No pericardial effusion. Mediastinum/Nodes: No enlarged mediastinal, hilar, or axillary lymph nodes. Thyroid gland, trachea, and esophagus demonstrate no significant findings. Lungs/Pleura: There are calcified pleural plaques in both lung apices. Ill-defined patchy ground-glass and nodular ground glass opacities are seen in the right upper lobe, right middle lobe and right lower lobe. There is an air-fluid level in the right mainstem bronchus and occlusive plugging of bronchi throughout the right lower lobe. There is also some occlusive material within the left lower lobe bronchus centrally. There are patchy airspace opacities in the right lower lobe and minimally in the left lower lobe as well. Secretions are seen in the trachea. There is no pleural effusion or pneumothorax. Musculoskeletal: The bones are osteopenic. There are compression deformities of T5, T6 and T12 which are age indeterminate. CT ABDOMEN PELVIS FINDINGS Hepatobiliary: Hepatic cysts measuring up 2 3.4 cm appear unchanged. Gallbladder sludge is likely present. There is no biliary ductal dilatation allowing for lack of intravenous contrast. Pancreas: Not well defined on this noncontrast study. Grossly within normal limits. Spleen: Normal in size without focal abnormality. Adrenals/Urinary Tract: Adrenal glands are  unremarkable. Kidneys are normal, without renal calculi, focal lesion, or hydronephrosis. The bladder is not well seen secondary to streak artifact in the pelvis. There are findings suspicious for large air-fluid  level in the bladder. Stomach/Bowel: Limited evaluation for bowel pathology secondary to lack of oral and intravenous contrast. There is no evidence for bowel obstruction. No dilated bowel loops are seen. Colonic diverticula are present. The appendix is not well delineated on this study. Vascular/Lymphatic: There are atherosclerotic calcifications of the aorta. No definitive lymphadenopathy allowing for lack of intravenous contrast. Reproductive: Not well evaluated secondary to lack of contrast and streak artifact in the pelvis. Other: No abdominal wall hernia or abnormality. No abdominopelvic ascites. Musculoskeletal: The bones are osteopenic. Left hip arthroplasty is present. Right-sided hip screws are present. There is severe compression deformity of L3 and mild compression deformity of L1 similar to prior. IMPRESSION: 1. Air-fluid level in the right mainstem bronchus with occlusive plugging of bronchi throughout the right lower lobe. There is also some occlusive material in the left lower lobe bronchus. Findings are compatible with aspiration. 2. Patchy airspace opacities in the right lower lobe and minimally in the left lower lobe worrisome for pneumonia. 3. Ill-defined patchy ground-glass and nodular ground-glass opacities in the right lung worrisome for infection. 4. Calcified pleural plaques in both lung apices. 5. Colonic diverticulosis. 6. Large air-fluid level in the bladder worrisome for infection. 7. Multiple compression deformities in the thoracic spine, age indeterminate. Aortic Atherosclerosis (ICD10-I70.0). Electronically Signed   By: Darliss Cheney M.D.   On: 08/22/2023 19:02   CT HEAD WO CONTRAST  Result Date: 08/22/2023 CLINICAL DATA:  Altered mental status EXAM: CT HEAD WITHOUT  CONTRAST TECHNIQUE: Contiguous axial images were obtained from the base of the skull through the vertex without intravenous contrast. RADIATION DOSE REDUCTION: This exam was performed according to the departmental dose-optimization program which includes automated exposure control, adjustment of the mA and/or kV according to patient size and/or use of iterative reconstruction technique. COMPARISON:  07/01/2022 FINDINGS: Brain: No evidence of acute infarction, hemorrhage, mass, mass effect, or midline shift. No hydrocephalus or extra-axial fluid collection. Age related cerebral atrophy. Periventricular white matter changes, likely the sequela of chronic small vessel ischemic disease. Vascular: No hyperdense vessel. Skull: Negative for fracture or focal lesion. Sinuses/Orbits: No acute finding. Other: The mastoid air cells are well aerated. IMPRESSION: No acute intracranial process. Electronically Signed   By: Wiliam Ke M.D.   On: 08/22/2023 16:48       Kathlen Mody M.D. [facility] 08/24/2023, 3:39 PM  Available via Epic secure chat 7am-7pm After 7 pm, please refer to night coverage provider listed on amion.

## 2023-08-24 NOTE — Progress Notes (Signed)
Chaplain engaged in an initial visit with Alicia Vang and her three children at bedside. Family engaged in  narrative life review describing Selda as a highly spiritual and faithful woman of God who has exhibited resilience, power, love, and purpose throughout her life. They celebrated who their mom has been to them.   Chaplain offered reflective listening and support.   08/24/23 1400  Spiritual Encounters  Type of Visit Initial  Care provided to: Pt and family  Reason for visit End-of-life  Spiritual Framework  Presenting Themes Impactful experiences and emotions;Community and relationships;Significant life change;Values and beliefs  Interventions  Spiritual Care Interventions Made Narrative/life review;Established relationship of care and support;Compassionate presence;Reflective listening;Supported grief process;Encouragement;Explored values/beliefs/practices/strengths  Intervention Outcomes  Outcomes Awareness of support;Connection to spiritual care

## 2023-08-24 DEATH — deceased

## 2023-08-25 DIAGNOSIS — R627 Adult failure to thrive: Secondary | ICD-10-CM | POA: Diagnosis not present

## 2023-08-25 DIAGNOSIS — A419 Sepsis, unspecified organism: Secondary | ICD-10-CM | POA: Diagnosis not present

## 2023-08-25 DIAGNOSIS — E870 Hyperosmolality and hypernatremia: Secondary | ICD-10-CM | POA: Diagnosis not present

## 2023-08-25 DIAGNOSIS — J189 Pneumonia, unspecified organism: Secondary | ICD-10-CM | POA: Diagnosis not present

## 2023-08-25 DIAGNOSIS — N179 Acute kidney failure, unspecified: Secondary | ICD-10-CM | POA: Diagnosis not present

## 2023-08-25 NOTE — Progress Notes (Signed)
[facility]                                                                               Alicia Vang, is a 89 y.o. female, DOB - 10-12-1927, ZOX:[id number] Admit date - 08/22/2023    Outpatient Primary MD for the patient is Eloisa Northern, MD  LOS - 3  days    Brief summary    Alicia Vang is a 89 y.o. female with medical history significant for newly diagnosed HFrEF 25 to 30% (2D echo 07/26/23), concerns for infiltrative cardiomyopathy from last TTE, history of sinus bradycardia, hypertension, CKD 3B, DM2, cognitive impairment, who presents to the ED from SNF due to decreasing responsiveness for the past 2 days.  The patient was recently admitted for acute HFrEF and discharged on 08/09/2023 to SNF.  Reportedly, since her discharge from the hospital she has had minimal food intake.  Nursing home staff noticed that for the past 2 days she has been less responsive than usual and stopped eating.  She was brought into the ED for further evaluation.   Assessment & Plan    Assessment and Plan:   Failure to thrive probably secondary to progressive cognitive impairment, dysphagia and dehydration. Palliative care consulted to establish goals of care. She's transitioned to comfort measures.  She has stabilized.  No beds at [facility].  Continue to monitor.  Comfort feeds from floor stock.    Severe hypovolemic hypernatremia secondary to dehydration from poor oral intake Free water deficit Patient presented with a sodium of 162, improved to 154   Aspiration pneumonia in the setting of dysphagia present on admission Patient was started on IV antibiotics, later on discontinued. SLP evalution.   Lactic acidosis Probably secondary to severe dehydration. Lactic acid wnl.   Acute kidney injury probably secondary to dehydration from poor oral intake Her baseline creatinine appears to be around 1 and she was admitted with a creatinine of 2.57, improved to 1.67 UA is  negative for leukocytes and nitrites with few bacteria.     Recent diagnosis of heart failure with reduced ejection fraction 25 to 30% by TTE done on July 26, 2023.  Hypotension Resolved.    Type 2 diabetes mellitus with hyperglycemia. Comfort feeds.    Concerns of infiltrative cardiomyopathy seen on last 2D echo done on 07/26/2023 History of sinus bradycardia PACs Wenckebach During previous admission, per cardiology's note, given the patient's comorbidities and bedbound status she is not a candidate for aggressive care.    In view of her declining health, recurrent admissions, aspiration pneumonia , dysphagia, she has overall poor prognosis Palliative care consulted for goals of care, transitioned to comfort measures.    RN Pressure Injury Documentation: Pressure Injury 08/23/23 Sacrum Mid;Medial Deep Tissue Pressure Injury - Purple or maroon localized area of discolored intact skin or blood-filled blister due to damage of underlying soft tissue from pressure and/or shear. red, purple (Active)  08/23/23 1716  Location: Sacrum  Location Orientation: Mid;Medial  Staging: Deep Tissue Pressure Injury - Purple or maroon localized area of discolored intact skin or blood-filled blister due to damage of underlying soft tissue from pressure and/or shear.  Wound Description (Comments): red,  purple  Present on Admission: Yes  Dressing Type Foam - Lift dressing to assess site every shift 08/25/23 0936     Pressure Injury 08/23/23 Ankle Posterior;Right;Lateral Stage 2 -  Partial thickness loss of dermis presenting as a shallow open injury with a red, pink wound bed without slough. red (Active)  08/23/23 1716  Location: Ankle  Location Orientation: Posterior;Right;Lateral  Staging: Stage 2 -  Partial thickness loss of dermis presenting as a shallow open injury with a red, pink wound bed without slough.  Wound Description (Comments): red  Present on Admission: Yes  Dressing Type Foam -  Lift dressing to assess site every shift 08/24/23 2300   Estimated body mass index is 12.28 kg/m as calculated from the following:   Height as of this encounter: 5\' 6"  (1.676 m).   Weight as of this encounter: 34.5 kg.  Code Status: comfort care.  DVT Prophylaxis:  comfort measures.    Level of Care: Level of care: Palliative Care Family Communication: Daughter at bedside  Disposition Plan:     Remains inpatient appropriate: awaiting bed at residential hospice.   Procedures:  None  Consultants:   Palliative  Antimicrobials:   Anti-infectives (From admission, onward)    Start     Dose/Rate Route Frequency Ordered Stop   08/23/23 1900  ceFEPIme (MAXIPIME) 1 g in sodium chloride 0.9 % 100 mL IVPB  Status:  Discontinued        1 g 200 mL/hr over 30 Minutes Intravenous Every 24 hours 08/22/23 1938 08/24/23 1204   08/22/23 1645  ceFEPIme (MAXIPIME) 2 g in sodium chloride 0.9 % 100 mL IVPB        2 g 200 mL/hr over 30 Minutes Intravenous  Once 08/22/23 1629 08/22/23 1847   08/22/23 1630  vancomycin (VANCOCIN) IVPB 1000 mg/200 mL premix  Status:  Discontinued        1,000 mg 200 mL/hr over 60 Minutes Intravenous  Once 08/22/23 1624 08/22/23 1625   08/22/23 1630  vancomycin (VANCOREADY) IVPB 750 mg/150 mL        750 mg 150 mL/hr over 60 Minutes Intravenous  Once 08/22/23 1626 08/22/23 2015        Medications  Scheduled Meds:  Chlorhexidine Gluconate Cloth  6 each Topical Daily   mouth rinse  15 mL Mouth Rinse 4 times per day   sodium chloride flush  3 mL Intravenous Q12H   Continuous Infusions:   PRN Meds:.acetaminophen, glycopyrrolate, morphine injection, mouth rinse, polyethylene glycol, prochlorperazine    Subjective:   Alicia Vang was seen and examined today. Appears comfortable.   Objective:   Vitals:   08/24/23 1540 08/24/23 1700 08/24/23 1900 08/25/23 0657  BP:   (!) 128/92 (!) 152/78  Pulse: 74 (!) 44 (!) 48 (!) 59  Resp: (!) 21 20  20   Temp:    (!) 97.5 F (36.4 C) (!) 97.5 F (36.4 C)  TempSrc:   Oral Oral  SpO2: 97% 96%  100%  Weight:      Height:       No intake or output data in the 24 hours ending 08/25/23 1858  Filed Weights   08/23/23 1550 08/23/23 1600  Weight: 34.5 kg 34.5 kg     Exam General exam: ill appearing frail elderly lady not in distress.  Respiratory system:diminished air entry. Cardiovascular system: S1 & S2 heard, bradycardia.  Gastrointestinal system: Abdomen is soft.  Central nervous system: unresponsive.  Extremities: no edema.  Data Reviewed:  I have personally reviewed following labs and imaging studies   CBC Lab Results  Component Value Date   WBC 3.8 (L) 08/24/2023   RBC 3.54 (L) 08/24/2023   HGB 10.6 (L) 08/24/2023   HCT 35.5 (L) 08/24/2023   MCV 100.3 (H) 08/24/2023   MCH 29.9 08/24/2023   PLT 102 (L) 08/24/2023   MCHC 29.9 (L) 08/24/2023   RDW 16.1 (H) 08/24/2023   LYMPHSABS 0.4 (L) 08/24/2023   MONOABS 0.2 08/24/2023   EOSABS 0.0 08/24/2023   BASOSABS 0.0 08/24/2023     Last metabolic panel Lab Results  Component Value Date   NA 154 (H) 08/24/2023   K 3.4 (L) 08/24/2023   CL 128 (H) 08/24/2023   CO2 23 08/24/2023   BUN 77 (H) 08/24/2023   CREATININE 1.67 (H) 08/24/2023   GLUCOSE 170 (H) 08/24/2023   GFRNONAA 28 (L) 08/24/2023   GFRAA 59 (L) 10/28/2017   CALCIUM 8.5 (L) 08/24/2023   PHOS 3.7 08/23/2023   PROT 7.8 08/22/2023   ALBUMIN 3.7 08/22/2023   BILITOT 1.1 08/22/2023   ALKPHOS 48 08/22/2023   AST 20 08/22/2023   ALT 18 08/22/2023   ANIONGAP 3 (L) 08/24/2023    CBG (last 3)  Recent Labs    08/24/23 0043 08/24/23 0339 08/24/23 0843  GLUCAP 152* 149* 117*      Coagulation Profile: No results for input(s): "INR", "PROTIME" in the last 168 hours.   Radiology Studies: No results found.     Kathlen Mody M.D. [facility] 08/25/2023, 6:58 PM  Available via Epic secure chat 7am-7pm After 7 pm, please refer to night  coverage provider listed on amion.

## 2023-08-25 NOTE — Progress Notes (Signed)
Alicia Vang [address]  Hospice hospital liaison note  Referral received for [facility]. Patient has been approved for [facility] however at this time no beds are available. Hospice home bed was offered but family would prefer to wait for next available bed at [facility].   TOC aware liaison will reach out as soon as a bed becomes available.   Thank you for the opportunity to participate in this patient's care.  Thea Gist BSN RN [facility]ops[facility] hospital liaison [phone]

## 2023-08-25 NOTE — Progress Notes (Signed)
Daily Progress Note   Patient Name: Alicia Vang       Date: 08/25/2023 DOB: 08/20/1927  Age: 89 y.o. MRN#: [id number] Attending Physician: Kathlen Mody, MD Primary Care Physician: Eloisa Northern, MD Admit Date: 08/22/2023  Reason for Consultation/Follow-up: Establishing goals of care  Subjective: Patient essentially unresponsive Actively dying  Length of Stay: 3  Current Medications: Scheduled Meds:   Chlorhexidine Gluconate Cloth  6 each Topical Daily   mouth rinse  15 mL Mouth Rinse 4 times per day   sodium chloride flush  3 mL Intravenous Q12H    Continuous Infusions:   PRN Meds: acetaminophen, glycopyrrolate, morphine injection, mouth rinse, polyethylene glycol, prochlorperazine  Physical Exam         Frail elderly lady actively dying Respirations with mandibular movement Does not open eyes does not follow commands Shallow breaths  Vital Signs: BP (!) 152/78 (BP Location: Right Arm)   Pulse (!) 59   Temp (!) 97.5 F (36.4 C) (Oral)   Resp 20   Ht 5\' 6"  (1.676 m)   Wt 34.5 kg   SpO2 100%   BMI 12.28 kg/m  SpO2: SpO2: 100 % O2 Device: O2 Device: Room Air O2 Flow Rate: O2 Flow Rate (L/min): 4 L/min  Intake/output summary: No intake or output data in the 24 hours ending 08/25/23 1248  LBM: Last BM Date :  (pta) Baseline Weight: Weight: 34.5 kg Most recent weight: Weight: 34.5 kg       Palliative Assessment/Data:      Patient Active Problem List   Diagnosis Date Noted   Adult failure to thrive 08/22/2023   At risk for deficient intake of food/liquids 08/07/2023   Acute hypoxic respiratory failure (HCC) 08/01/2023   Pressure injury of skin 07/30/2023   Protein-calorie malnutrition, severe 07/28/2023   Acute on chronic systolic CHF (congestive  heart failure) (HCC) 07/27/2023   Dilated cardiomyopathy (HCC) 07/27/2023   Pleural effusion on left 07/27/2023   Chronic kidney disease, stage 3a (HCC) 07/05/2022   Hypoalbuminemia 07/05/2022   Closed displaced fracture of right femoral neck (HCC) 07/01/2022   Traumatic intraventricular hemorrhage (HCC) 07/01/2022   Closed compression fracture of L3 lumbar vertebra, with delayed healing, subsequent encounter 06/01/2021   Parkinson's disease (HCC) 06/01/2021   Primary hypertension 06/01/2021   COVID-19 virus infection 10/21/2020  Bradycardia 10/21/2020   Type 2 diabetes mellitus without complication, without long-term current use of insulin (HCC) 10/21/2020   Primary osteoarthritis of left hip 10/26/2017   Osteoarthritis of left hip 08/17/2017    Palliative Care Assessment & Plan   Patient Profile:    Assessment: Heart failure with reduced ejection fraction 25-30% History of sinus bradycardia History of hypertension CKD 3B diabetes cognitive impairment Admitted from skilled nursing facility with decreased responsiveness, admitted to hospital medicine service for possible aspiration pneumonia in the setting of dysphagia, hospital course also complicated by electrolyte abnormalities severe hypovolemic hypernatremia deemed secondary to dehydration from poor oral intake.  Also with lactic acidosis and acute kidney injury.  Recommendations/Plan: Comfort care Transfer to hospice when bed available.  Goals of Care and Additional Recommendations: Limitations on Scope of Treatment: Full Comfort Care  Code Status: DNR DNI    Code Status Orders  (From admission, onward)           Start     Ordered   08/22/23 1937  Do not attempt resuscitation (DNR)- Limited -Do Not Intubate (DNI)  Continuous       Question Answer Comment  If pulseless and not breathing No CPR or chest compressions.   In Pre-Arrest Conditions (Patient Is Breathing and Has A Pulse) Do not intubate. Provide all  appropriate non-invasive medical interventions. Avoid ICU transfer unless indicated or required.   Consent: Discussion documented in EHR or advanced directives reviewed      08/22/23 1936           Code Status History     Date Active Date Inactive Code Status Order ID Comments User Context   08/22/2023 1934 08/22/2023 1936 Limited: Do not attempt resuscitation (DNR) -DNR-LIMITED -Do Not Intubate/DNI  [id number]  Charlynne Pander, MD ED   08/09/2023 1255 08/09/2023 2153 Limited: Do not attempt resuscitation (DNR) -DNR-LIMITED -Do Not Intubate/DNI  [id number]  Coralie Keens, MD Inpatient   07/25/2023 2344 08/09/2023 1255 Full Code [id number]  Gery Pray, MD ED   07/25/2023 2312 07/25/2023 2344 Full Code [id number]  Gery Pray, MD ED   07/02/2022 0031 07/06/2022 2341 Full Code [id number]  Marinda Elk, MD ED   06/01/2021 0528 06/07/2021 0310 Full Code [id number]  Hillary Bow, DO ED   10/21/2020 0335 10/24/2020 1943 Full Code [id number]  John Giovanni, MD ED   10/26/2017 1558 10/29/2017 2309 Full Code [id number]  Allena Katz, PA-C Inpatient      Advance Directive Documentation    Flowsheet Row Most Recent Value  Type of Advance Directive Healthcare Power of Attorney  Pre-existing out of facility DNR order (yellow form or pink MOST form) --  "MOST" Form in Place? --       Prognosis:  Hours - Days  Discharge Planning: Hospice facility  Care plan was discussed with   TRH MD, daughter and  son present at the bedside.  Thank you for allowing the Palliative Medicine Team to assist in the care of this patient. mod MDM     Greater than 50%  of this time was spent counseling and coordinating care related to the above assessment and plan.  Rosalin Hawking, MD  Please contact Palliative Medicine Team phone at [phone] for questions and concerns.

## 2023-08-25 NOTE — Plan of Care (Signed)
  Problem: Skin Integrity: Goal: Risk for impaired skin integrity will decrease Outcome: Progressing   Problem: Coping: Goal: Level of anxiety will decrease Outcome: Progressing   Problem: Pain Management: Goal: General experience of comfort will improve Outcome: Progressing   Problem: Safety: Goal: Ability to remain free from injury will improve Outcome: Progressing   Problem: Skin Integrity: Goal: Risk for impaired skin integrity will decrease Outcome: Progressing

## 2023-08-26 DIAGNOSIS — E870 Hyperosmolality and hypernatremia: Secondary | ICD-10-CM | POA: Diagnosis not present

## 2023-08-26 DIAGNOSIS — R627 Adult failure to thrive: Secondary | ICD-10-CM | POA: Diagnosis not present

## 2023-08-26 DIAGNOSIS — N179 Acute kidney failure, unspecified: Secondary | ICD-10-CM | POA: Diagnosis not present

## 2023-08-26 DIAGNOSIS — J189 Pneumonia, unspecified organism: Secondary | ICD-10-CM | POA: Diagnosis not present

## 2023-08-26 MED ORDER — STERILE WATER FOR INJECTION IJ SOLN
INTRAMUSCULAR | Status: AC
  Filled 2023-08-26: qty 10

## 2023-08-26 MED ORDER — SODIUM CHLORIDE 0.9 % IV SOLN: INTRAVENOUS | Status: DC

## 2023-08-26 NOTE — Progress Notes (Signed)
Daily Progress Note   Patient Name: Alicia Vang       Date: 08/26/2023 DOB: 1927/09/08  Age: 89 y.o. MRN#: [id number] Attending Physician: Kathlen Mody, MD Primary Care Physician: Eloisa Northern, MD Admit Date: 08/22/2023  Reason for Consultation/Follow-up: Establishing goals of care  Subjective: Patient continues to appear unresponsive Actively dying  Length of Stay: 4  Current Medications: Scheduled Meds:   Chlorhexidine Gluconate Cloth  6 each Topical Daily   mouth rinse  15 mL Mouth Rinse 4 times per day    Continuous Infusions:  sodium chloride      PRN Meds: acetaminophen, glycopyrrolate, morphine injection, mouth rinse, polyethylene glycol, prochlorperazine  Physical Exam         Frail elderly lady actively dying Respirations with mandibular movement Does not open eyes does not follow commands Shallow breaths  Vital Signs: BP (!) 140/64 (BP Location: Left Arm)   Pulse 69   Temp 98.4 F (36.9 C) (Oral)   Resp 16   Ht 5\' 6"  (1.676 m)   Wt 34.5 kg   SpO2 95%   BMI 12.28 kg/m  SpO2: SpO2: 95 % O2 Device: O2 Device: Room Air O2 Flow Rate: O2 Flow Rate (L/min): 4 L/min  Intake/output summary: No intake or output data in the 24 hours ending 08/26/23 1318  LBM: Last BM Date :  (pta) Baseline Weight: Weight: 34.5 kg Most recent weight: Weight: 34.5 kg       Palliative Assessment/Data:      Patient Active Problem List   Diagnosis Date Noted   Adult failure to thrive 08/22/2023   At risk for deficient intake of food/liquids 08/07/2023   Acute hypoxic respiratory failure (HCC) 08/01/2023   Pressure injury of skin 07/30/2023   Protein-calorie malnutrition, severe 07/28/2023   Acute on chronic systolic CHF (congestive heart failure) (HCC) 07/27/2023    Dilated cardiomyopathy (HCC) 07/27/2023   Pleural effusion on left 07/27/2023   Chronic kidney disease, stage 3a (HCC) 07/05/2022   Hypoalbuminemia 07/05/2022   Closed displaced fracture of right femoral neck (HCC) 07/01/2022   Traumatic intraventricular hemorrhage (HCC) 07/01/2022   Closed compression fracture of L3 lumbar vertebra, with delayed healing, subsequent encounter 06/01/2021   Parkinson's disease (HCC) 06/01/2021   Primary hypertension 06/01/2021   COVID-19 virus infection 10/21/2020   Bradycardia 10/21/2020  Type 2 diabetes mellitus without complication, without long-term current use of insulin (HCC) 10/21/2020   Primary osteoarthritis of left hip 10/26/2017   Osteoarthritis of left hip 08/17/2017    Palliative Care Assessment & Plan   Patient Profile:    Assessment: Heart failure with reduced ejection fraction 25-30% History of sinus bradycardia History of hypertension CKD 3B diabetes cognitive impairment Admitted from skilled nursing facility with decreased responsiveness, admitted to hospital medicine service for possible aspiration pneumonia in the setting of dysphagia, hospital course also complicated by electrolyte abnormalities severe hypovolemic hypernatremia deemed secondary to dehydration from poor oral intake.  Also with lactic acidosis and acute kidney injury.  Recommendations/Plan: Comfort care Transfer to hospice when bed available.  Discussed with TRH MD, agree with the low-dose IV fluids. Goals of Care and Additional Recommendations: Limitations on Scope of Treatment: Full Comfort Care  Code Status: DNR DNI    Code Status Orders  (From admission, onward)           Start     Ordered   08/22/23 1937  Do not attempt resuscitation (DNR)- Limited -Do Not Intubate (DNI)  Continuous       Question Answer Comment  If pulseless and not breathing No CPR or chest compressions.   In Pre-Arrest Conditions (Patient Is Breathing and Has A Pulse) Do  not intubate. Provide all appropriate non-invasive medical interventions. Avoid ICU transfer unless indicated or required.   Consent: Discussion documented in EHR or advanced directives reviewed      08/22/23 1936           Code Status History     Date Active Date Inactive Code Status Order ID Comments User Context   08/22/2023 1934 08/22/2023 1936 Limited: Do not attempt resuscitation (DNR) -DNR-LIMITED -Do Not Intubate/DNI  [id number]  Charlynne Pander, MD ED   08/09/2023 1255 08/09/2023 2153 Limited: Do not attempt resuscitation (DNR) -DNR-LIMITED -Do Not Intubate/DNI  [id number]  Coralie Keens, MD Inpatient   07/25/2023 2344 08/09/2023 1255 Full Code [id number]  Gery Pray, MD ED   07/25/2023 2312 07/25/2023 2344 Full Code [id number]  Gery Pray, MD ED   07/02/2022 0031 07/06/2022 2341 Full Code [id number]  Marinda Elk, MD ED   06/01/2021 0528 06/07/2021 0310 Full Code [id number]  Hillary Bow, DO ED   10/21/2020 0335 10/24/2020 1943 Full Code [id number]  John Giovanni, MD ED   10/26/2017 1558 10/29/2017 2309 Full Code [id number]  Allena Katz, PA-C Inpatient      Advance Directive Documentation    Flowsheet Row Most Recent Value  Type of Advance Directive Healthcare Power of Attorney  Pre-existing out of facility DNR order (yellow form or pink MOST form) --  "MOST" Form in Place? --       Prognosis:  Hours - Days  Discharge Planning: Hospice facility  Care plan was discussed with   TRH MD, daughter and  son present at the bedside.  Thank you for allowing the Palliative Medicine Team to assist in the care of this patient. mod MDM     Greater than 50%  of this time was spent counseling and coordinating care related to the above assessment and plan.  Rosalin Hawking, MD  Please contact Palliative Medicine Team phone at [phone] for questions and concerns.

## 2023-08-26 NOTE — Progress Notes (Signed)
[facility]                                                                               Alicia Vang, is a 89 y.o. female, DOB - 07/19/1927, QIH:[id number] Admit date - 08/22/2023    Outpatient Primary MD for the patient is Alicia Northern, MD  LOS - 4  days    Brief summary    Alicia Vang is a 89 y.o. female with medical history significant for newly diagnosed HFrEF 25 to 30% (2D echo 07/26/23), concerns for infiltrative cardiomyopathy from last TTE, history of sinus bradycardia, hypertension, CKD 3B, DM2, cognitive impairment, who presents to the ED from SNF due to decreasing responsiveness for the past 2 days.  The patient was recently admitted for acute HFrEF and discharged on 08/09/2023 to SNF.  Reportedly, since her discharge from the hospital she has had minimal food intake.  Nursing home staff noticed that for the past 2 days she has been less responsive than usual and stopped eating.  She was brought into the ED for further evaluation.   Assessment & Plan    Assessment and Plan:   Failure to thrive probably secondary to progressive cognitive impairment, dysphagia and dehydration. Palliative care consulted to establish goals of care. She's transitioned to comfort measures.  She has stabilized.  No beds at [facility].  Continue to monitor.  Comfort feeds from floor stock.  Patient totally unresponsive today, and she was choking on comfort feeds.  Family is concerned about starving and dehydration and wanted to start her on IV fluids understanding the risks. But she doe snot have any IV , .    Severe hypovolemic hypernatremia secondary to dehydration from poor oral intake Free water deficit Patient presented with a sodium of 162, improved to 154   Aspiration pneumonia in the setting of dysphagia present on admission Patient was started on IV antibiotics, later on discontinued. SLP evalution.   Lactic acidosis Probably secondary to severe  dehydration. Lactic acid wnl.   Acute kidney injury probably secondary to dehydration from poor oral intake Her baseline creatinine appears to be around 1 and she was admitted with a creatinine of 2.57, improved to 1.67 with IV fluids.  UA is negative for leukocytes and nitrites with few bacteria.     Recent diagnosis of heart failure with reduced ejection fraction 25 to 30% by TTE done on July 26, 2023.  Hypotension Resolved.    Type 2 diabetes mellitus with hyperglycemia.    Concerns of infiltrative cardiomyopathy seen on last 2D echo done on 07/26/2023 History of sinus bradycardia PACs [place]kebach During previous admission, per cardiology's note, given the patient's comorbidities and bedbound status she is not a candidate for aggressive care.    In view of her declining health, recurrent admissions, aspiration pneumonia , dysphagia, she has overall poor prognosis Palliative care consulted for goals of care, transitioned to comfort measures.  Currently waiting for bed at residential hospice. They would like [facility].    RN Pressure Injury Documentation: Pressure Injury 08/23/23 Sacrum Mid;Medial Deep Tissue Pressure Injury - Purple or maroon localized area of discolored intact skin or blood-filled blister due to damage of  underlying soft tissue from pressure and/or shear. red, purple (Active)  08/23/23 1716  Location: Sacrum  Location Orientation: Mid;Medial  Staging: Deep Tissue Pressure Injury - Purple or maroon localized area of discolored intact skin or blood-filled blister due to damage of underlying soft tissue from pressure and/or shear.  Wound Description (Comments): red, purple  Present on Admission: Yes  Dressing Type Foam - Lift dressing to assess site every shift 08/26/23 0800     Pressure Injury 08/23/23 Ankle Posterior;Right;Lateral Stage 2 -  Partial thickness loss of dermis presenting as a shallow open injury with a red, pink wound bed without slough.  red (Active)  08/23/23 1716  Location: Ankle  Location Orientation: Posterior;Right;Lateral  Staging: Stage 2 -  Partial thickness loss of dermis presenting as a shallow open injury with a red, pink wound bed without slough.  Wound Description (Comments): red  Present on Admission: Yes  Dressing Type Foam - Lift dressing to assess site every shift 08/26/23 0800   Estimated body mass index is 12.28 kg/m as calculated from the following:   Height as of this encounter: 5\' 6"  (1.676 m).   Weight as of this encounter: 34.5 kg.  Code Status: comfort care.  DVT Prophylaxis:  comfort measures.    Level of Care: Level of care: Palliative Care Family Communication: Daughter at bedside  Disposition Plan:     Remains inpatient appropriate: awaiting bed at residential hospice.   Procedures:  None  Consultants:   Palliative  Antimicrobials:   Anti-infectives (From admission, onward)    Start     Dose/Rate Route Frequency Ordered Stop   08/23/23 1900  ceFEPIme (MAXIPIME) 1 g in sodium chloride 0.9 % 100 mL IVPB  Status:  Discontinued        1 g 200 mL/hr over 30 Minutes Intravenous Every 24 hours 08/22/23 1938 08/24/23 1204   08/22/23 1645  ceFEPIme (MAXIPIME) 2 g in sodium chloride 0.9 % 100 mL IVPB        2 g 200 mL/hr over 30 Minutes Intravenous  Once 08/22/23 1629 08/22/23 1847   08/22/23 1630  vancomycin (VANCOCIN) IVPB 1000 mg/200 mL premix  Status:  Discontinued        1,000 mg 200 mL/hr over 60 Minutes Intravenous  Once 08/22/23 1624 08/22/23 1625   08/22/23 1630  vancomycin (VANCOREADY) IVPB 750 mg/150 mL        750 mg 150 mL/hr over 60 Minutes Intravenous  Once 08/22/23 1626 08/22/23 2015        Medications  Scheduled Meds:  Chlorhexidine Gluconate Cloth  6 each Topical Daily   mouth rinse  15 mL Mouth Rinse 4 times per day   Continuous Infusions:  sodium chloride      PRN Meds:.acetaminophen, glycopyrrolate, morphine injection, mouth rinse, polyethylene  glycol, prochlorperazine    Subjective:   Kenasia Allcock was seen and examined today. Totally unresponsive.   Objective:   Vitals:   08/24/23 1700 08/24/23 1900 08/25/23 0657 08/26/23 0657  BP:  (!) 128/92 (!) 152/78 (!) 140/64  Pulse: (!) 44 (!) 48 (!) 59 69  Resp: 20  20 16   Temp:  (!) 97.5 F (36.4 C) (!) 97.5 F (36.4 C) 98.4 F (36.9 C)  TempSrc:  Oral Oral Oral  SpO2: 96%  100% 95%  Weight:      Height:       No intake or output data in the 24 hours ending 08/26/23 1832  Filed Weights   08/23/23 1550 08/23/23  1600  Weight: 34.5 kg 34.5 kg     Exam General exam: elderly woman totally unresponsive.  Respiratory system: Diminished at bases. On RA.  Cardiovascular system: S1 & S2 heard, Bradycardic.  Gastrointestinal system: Abdomen is soft.  Central nervous system:  totally unresponsive.  Extremities: no edema.  Skin: No rashes,  Psychiatry: unable to assess.        Data Reviewed:  I have personally reviewed following labs and imaging studies   CBC Lab Results  Component Value Date   WBC 3.8 (L) 08/24/2023   RBC 3.54 (L) 08/24/2023   HGB 10.6 (L) 08/24/2023   HCT 35.5 (L) 08/24/2023   MCV 100.3 (H) 08/24/2023   MCH 29.9 08/24/2023   PLT 102 (L) 08/24/2023   MCHC 29.9 (L) 08/24/2023   RDW 16.1 (H) 08/24/2023   LYMPHSABS 0.4 (L) 08/24/2023   MONOABS 0.2 08/24/2023   EOSABS 0.0 08/24/2023   BASOSABS 0.0 08/24/2023     Last metabolic panel Lab Results  Component Value Date   NA 154 (H) 08/24/2023   K 3.4 (L) 08/24/2023   CL 128 (H) 08/24/2023   CO2 23 08/24/2023   BUN 77 (H) 08/24/2023   CREATININE 1.67 (H) 08/24/2023   GLUCOSE 170 (H) 08/24/2023   GFRNONAA 28 (L) 08/24/2023   GFRAA 59 (L) 10/28/2017   CALCIUM 8.5 (L) 08/24/2023   PHOS 3.7 08/23/2023   PROT 7.8 08/22/2023   ALBUMIN 3.7 08/22/2023   BILITOT 1.1 08/22/2023   ALKPHOS 48 08/22/2023   AST 20 08/22/2023   ALT 18 08/22/2023   ANIONGAP 3 (L) 08/24/2023    CBG (last  3)  Recent Labs    08/24/23 0043 08/24/23 0339 08/24/23 0843  GLUCAP 152* 149* 117*      Coagulation Profile: No results for input(s): "INR", "PROTIME" in the last 168 hours.   Radiology Studies: No results found.     Kathlen Mody M.D. [facility] 08/26/2023, 6:32 PM  Available via Epic secure chat 7am-7pm After 7 pm, please refer to night coverage provider listed on amion.

## 2023-08-26 NOTE — Plan of Care (Signed)
  Problem: Education: Goal: Ability to describe self-care measures that may prevent or decrease complications (Diabetes Survival Skills Education) will improve Outcome: Progressing   Problem: Pain Management: Goal: General experience of comfort will improve Outcome: Progressing

## 2023-08-26 NOTE — Progress Notes (Signed)
[facility] Liaison Note  [facility] continues to follow for [facility] availability.  At this time, there are no beds available at [facility].  Family elected to wait on bed at [facility] instead of transferring to [facility] in [place].  TOC is aware of family choice.   Liaison will notify TOC if bed comes available.  Please call with any hospice related questions or concerns.  Doreatha Martin, RN, [facility] [phone]

## 2023-08-27 DIAGNOSIS — N179 Acute kidney failure, unspecified: Secondary | ICD-10-CM | POA: Diagnosis not present

## 2023-08-27 DIAGNOSIS — R627 Adult failure to thrive: Secondary | ICD-10-CM | POA: Diagnosis not present

## 2023-08-27 DIAGNOSIS — E870 Hyperosmolality and hypernatremia: Secondary | ICD-10-CM | POA: Diagnosis not present

## 2023-08-27 DIAGNOSIS — J189 Pneumonia, unspecified organism: Secondary | ICD-10-CM | POA: Diagnosis not present

## 2023-08-27 MED ORDER — SODIUM CHLORIDE 0.9 % IV SOLN
INTRAVENOUS | Status: AC
  Administered 2023-08-27: 500 mL via INTRAVENOUS

## 2023-08-27 NOTE — Progress Notes (Signed)
Chaplain followed up with Sunaina's family at bedside. Chaplain was able to meet one of Mikaylee's son who was not a previous initial visit. Son and his wife shared about their desire to see if Tawnie can receive hydration and recover from being comfort/hospice care. They desire her to go back to [facility] They shared their faith in Jesus. They believe that getting Javionna to eat and drink will allow her to recover back to where she was two weeks ago.  Chaplain offered listening, support, and presence.    08/27/23 1600  Spiritual Encounters  Type of Visit Follow up  Care provided to: Pt and family

## 2023-08-27 NOTE — Progress Notes (Signed)
PMT no charge note.   Discussed with TRH MD. Patient's family wishes to undergo trial of NGT and tube feeds, they are considering discharge back to SNF with hospice instead of residential hospice.  PMT will follow peripherally.  No charge Rosalin Hawking MD [facility].

## 2023-08-27 NOTE — Plan of Care (Signed)
  Problem: Clinical Measurements: Goal: Will remain free from infection Outcome: Progressing Goal: Cardiovascular complication will be avoided Outcome: Progressing   Problem: Safety: Goal: Ability to remain free from injury will improve Outcome: Progressing

## 2023-08-27 NOTE — TOC Progression Note (Signed)
Transition of Care [facility]) - Progression Note    Patient Details  Name: Alicia Vang MRN: [id number] Date of Birth: May 25, 1927  [facility]) CM/SW Contact  Beckie Busing, RN Phone Number:[phone]  08/27/2023, 9:43 AM  Clinical Narrative:    CM received message stating that [facility] still does not have a bed.    Expected Discharge Plan: Hospice Medical Facility Barriers to Discharge: Continued Medical Work up  Expected Discharge Plan and Services In-house Referral: Hospice / Palliative Care, Chaplain Discharge Planning Services: CM Consult Post Acute Care Choice: Hospice Living arrangements for the past 2 months: Skilled Nursing Facility                 DME Arranged: N/A DME Agency: NA       HH Arranged: NA HH Agency: NA         Social Determinants of Health (SDOH) Interventions SDOH Screenings   Food Insecurity: No Food Insecurity (08/23/2023)  Housing: Low Risk  (08/23/2023)  Transportation Needs: No Transportation Needs (08/23/2023)  Utilities: Not At Risk (08/23/2023)  Tobacco Use: Low Risk  (07/25/2023)    Readmission Risk Interventions    08/24/2023    1:31 PM  Readmission Risk Prevention Plan  Transportation Screening Complete  PCP or Specialist Appt within 3-5 Days Complete  HRI or Home Care Consult Complete  Social Work Consult for Recovery Care Planning/Counseling Complete  Palliative Care Screening Complete  Medication Review Oceanographer) Complete

## 2023-08-27 NOTE — Progress Notes (Signed)
*   [facility] Liaison Note   [facility] continues to follow for discharge disposition for hospice services at Inpatient Hospice Facility versus LTC.    Glenna Fellows, BSN, RN, [facility] [phone]

## 2023-08-27 NOTE — Progress Notes (Signed)
[facility]                                                                               Alicia Vang, is a 89 y.o. female, DOB - 04/23/1927, WUJ:[id number] Admit date - 08/22/2023    Outpatient Primary MD for the patient is Eloisa Northern, MD  LOS - 5  days    Brief summary    Alicia Vang is a 89 y.o. female with medical history significant for newly diagnosed HFrEF 25 to 30% (2D echo 07/26/23), concerns for infiltrative cardiomyopathy from last TTE, history of sinus bradycardia, hypertension, CKD 3B, DM2, cognitive impairment, who presents to the ED from SNF due to decreasing responsiveness for the past 2 days.  The patient was recently admitted for acute HFrEF and discharged on 08/09/2023 to SNF.  Reportedly, since her discharge from the hospital she has had minimal food intake.  Nursing home staff noticed that for the past 2 days she has been less responsive than usual and stopped eating.  She was brought into the ED for further evaluation.   Assessment & Plan    Assessment and Plan:   Failure to thrive probably secondary to progressive cognitive impairment, dysphagia and dehydration. Palliative care consulted to establish goals of care. She's transitioned to comfort measures.  She has stabilized.  No beds at [facility].  Continue to monitor.  Comfort feeds from floor stock.  Patient totally unresponsive today, and she was choking on comfort feeds.  Family is concerned about starving and dehydration and wanted to start her on IV fluids understanding the risks.  They would like to do a trial of NGT and tube feeds for a couple of days and see how she does.    Severe hypovolemic hypernatremia secondary to dehydration from poor oral intake Free water deficit Patient presented with a sodium of 162, improved to 154   Aspiration pneumonia in the setting of dysphagia present on admission Patient was started on IV antibiotics, later on discontinued. SLP  evalution.   Lactic acidosis Probably secondary to severe dehydration. Lactic acid wnl.   Acute kidney injury probably secondary to dehydration from poor oral intake Her baseline creatinine appears to be around 1 and she was admitted with a creatinine of 2.57, improved to 1.67 with IV fluids.  UA is negative for leukocytes and nitrites with few bacteria.     Recent diagnosis of heart failure with reduced ejection fraction 25 to 30% by TTE done on July 26, 2023.  Hypotension Resolved.    Type 2 diabetes mellitus with hyperglycemia.    Concerns of infiltrative cardiomyopathy seen on last 2D echo done on 07/26/2023 History of sinus bradycardia PACs [place] During previous admission, per cardiology's note, given the patient's comorbidities and bedbound status she is not a candidate for aggressive care.    In view of her declining health, recurrent admissions, aspiration pneumonia , dysphagia, she has overall poor prognosis Palliative care consulted for goals of care, transitioned to comfort measures.  They would like her to go back to [facility] with hospice.    RN Pressure Injury Documentation: Pressure Injury 08/23/23 Sacrum Mid;Medial Deep Tissue Pressure Injury - Purple or maroon  localized area of discolored intact skin or blood-filled blister due to damage of underlying soft tissue from pressure and/or shear. red, purple (Active)  08/23/23 1716  Location: Sacrum  Location Orientation: Mid;Medial  Staging: Deep Tissue Pressure Injury - Purple or maroon localized area of discolored intact skin or blood-filled blister due to damage of underlying soft tissue from pressure and/or shear.  Wound Description (Comments): red, purple  Present on Admission: Yes  Dressing Type Foam - Lift dressing to assess site every shift 08/26/23 0800     Pressure Injury 08/23/23 Ankle Posterior;Right;Lateral Stage 2 -  Partial thickness loss of dermis presenting as a shallow open injury with  a red, pink wound bed without slough. red (Active)  08/23/23 1716  Location: Ankle  Location Orientation: Posterior;Right;Lateral  Staging: Stage 2 -  Partial thickness loss of dermis presenting as a shallow open injury with a red, pink wound bed without slough.  Wound Description (Comments): red  Present on Admission: Yes  Dressing Type Foam - Lift dressing to assess site every shift 08/26/23 0800   Estimated body mass index is 12.28 kg/m as calculated from the following:   Height as of this encounter: 5\' 6"  (1.676 m).   Weight as of this encounter: 34.5 kg.  Code Status: DNR DVT Prophylaxis:     Level of Care: Level of care: Palliative Care Family Communication: Daughter at bedside  Disposition Plan:     Remains inpatient appropriate: FAMILY WOULD DO A TRIAL OF NGT With feeds.   Procedures:  None  Consultants:   Palliative  Antimicrobials:   Anti-infectives (From admission, onward)    Start     Dose/Rate Route Frequency Ordered Stop   08/23/23 1900  ceFEPIme (MAXIPIME) 1 g in sodium chloride 0.9 % 100 mL IVPB  Status:  Discontinued        1 g 200 mL/hr over 30 Minutes Intravenous Every 24 hours 08/22/23 1938 08/24/23 1204   08/22/23 1645  ceFEPIme (MAXIPIME) 2 g in sodium chloride 0.9 % 100 mL IVPB        2 g 200 mL/hr over 30 Minutes Intravenous  Once 08/22/23 1629 08/22/23 1847   08/22/23 1630  vancomycin (VANCOCIN) IVPB 1000 mg/200 mL premix  Status:  Discontinued        1,000 mg 200 mL/hr over 60 Minutes Intravenous  Once 08/22/23 1624 08/22/23 1625   08/22/23 1630  vancomycin (VANCOREADY) IVPB 750 mg/150 mL        750 mg 150 mL/hr over 60 Minutes Intravenous  Once 08/22/23 1626 08/22/23 2015        Medications  Scheduled Meds:  Chlorhexidine Gluconate Cloth  6 each Topical Daily   mouth rinse  15 mL Mouth Rinse 4 times per day   Continuous Infusions:  sodium chloride      PRN Meds:.acetaminophen, glycopyrrolate, morphine injection, mouth rinse,  polyethylene glycol, prochlorperazine    Subjective:   Alicia Vang was seen and examined today. unresponsive.   Objective:   Vitals:   08/24/23 1900 08/25/23 0657 08/26/23 0657 08/27/23 0428  BP: (!) 128/92 (!) 152/78 (!) 140/64 (!) 141/57  Pulse: (!) 48 (!) 59 69 (!) 58  Resp:  20 16 18   Temp: (!) 97.5 F (36.4 C) (!) 97.5 F (36.4 C) 98.4 F (36.9 C) 97.7 F (36.5 C)  TempSrc: Oral Oral Oral Oral  SpO2:  100% 95% 91%  Weight:      Height:       No intake or output  data in the 24 hours ending 08/27/23 1357  Filed Weights   08/23/23 1550 08/23/23 1600  Weight: 34.5 kg 34.5 kg     Exam General exam: ill appearing elderly frail lady not in distress, remains unresponsive.  Respiratory system: air entry fair.  Cardiovascular system: S1 & S2 heard, RRR. No JVD,  Gastrointestinal system: Abdomen is soft,  Central nervous system: unresponsive to verbal commands and stimuli.  Extremities: Symmetric 5 x 5 power. Skin: No rashes,  Psychiatry:unable to assess.         Data Reviewed:  I have personally reviewed following labs and imaging studies   CBC Lab Results  Component Value Date   WBC 3.8 (L) 08/24/2023   RBC 3.54 (L) 08/24/2023   HGB 10.6 (L) 08/24/2023   HCT 35.5 (L) 08/24/2023   MCV 100.3 (H) 08/24/2023   MCH 29.9 08/24/2023   PLT 102 (L) 08/24/2023   MCHC 29.9 (L) 08/24/2023   RDW 16.1 (H) 08/24/2023   LYMPHSABS 0.4 (L) 08/24/2023   MONOABS 0.2 08/24/2023   EOSABS 0.0 08/24/2023   BASOSABS 0.0 08/24/2023     Last metabolic panel Lab Results  Component Value Date   NA 154 (H) 08/24/2023   K 3.4 (L) 08/24/2023   CL 128 (H) 08/24/2023   CO2 23 08/24/2023   BUN 77 (H) 08/24/2023   CREATININE 1.67 (H) 08/24/2023   GLUCOSE 170 (H) 08/24/2023   GFRNONAA 28 (L) 08/24/2023   GFRAA 59 (L) 10/28/2017   CALCIUM 8.5 (L) 08/24/2023   PHOS 3.7 08/23/2023   PROT 7.8 08/22/2023   ALBUMIN 3.7 08/22/2023   BILITOT 1.1 08/22/2023   ALKPHOS 48  08/22/2023   AST 20 08/22/2023   ALT 18 08/22/2023   ANIONGAP 3 (L) 08/24/2023    CBG (last 3)  No results for input(s): "GLUCAP" in the last 72 hours.     Coagulation Profile: No results for input(s): "INR", "PROTIME" in the last 168 hours.   Radiology Studies: No results found.     Kathlen Mody M.D. [facility] 08/27/2023, 1:57 PM  Available via Epic secure chat 7am-7pm After 7 pm, please refer to night coverage provider listed on amion.

## 2023-08-28 DIAGNOSIS — R627 Adult failure to thrive: Secondary | ICD-10-CM | POA: Diagnosis not present

## 2023-08-28 DIAGNOSIS — Z515 Encounter for palliative care: Secondary | ICD-10-CM | POA: Diagnosis not present

## 2023-08-28 DIAGNOSIS — A419 Sepsis, unspecified organism: Secondary | ICD-10-CM | POA: Diagnosis not present

## 2023-08-28 DIAGNOSIS — N179 Acute kidney failure, unspecified: Principal | ICD-10-CM

## 2023-08-28 DIAGNOSIS — T17908S Unspecified foreign body in respiratory tract, part unspecified causing other injury, sequela: Secondary | ICD-10-CM

## 2023-08-28 DIAGNOSIS — E870 Hyperosmolality and hypernatremia: Secondary | ICD-10-CM

## 2023-08-28 DIAGNOSIS — Z7189 Other specified counseling: Secondary | ICD-10-CM

## 2023-08-28 DIAGNOSIS — T17908A Unspecified foreign body in respiratory tract, part unspecified causing other injury, initial encounter: Secondary | ICD-10-CM

## 2023-08-28 DIAGNOSIS — R4589 Other symptoms and signs involving emotional state: Secondary | ICD-10-CM

## 2023-08-28 LAB — VITAMIN B1: Vitamin B1 (Thiamine): 78.6 nmol/L (ref 66.5–200.0)

## 2023-08-28 MED ORDER — HYDROMORPHONE HCL 1 MG/ML IJ SOLN: 0.2000 mg | INTRAMUSCULAR | Status: DC | PRN

## 2023-08-28 MED ORDER — SODIUM CHLORIDE 0.9 % IV SOLN: INTRAVENOUS | Status: AC

## 2023-08-28 NOTE — Progress Notes (Signed)
Daily Progress Note   Patient Name: Alicia Vang       Date: 08/28/2023 DOB: 1927/05/25  Age: 89 y.o. MRN#: [id number] Attending Physician: Kathlen Mody, MD Primary Care Physician: Eloisa Northern, MD Admit Date: 08/22/2023 Length of Stay: 6 days  Reason for Consultation/Follow-up: Establishing goals of care  Subjective:   CC: Patient laying in bed, minimally open eyes at times and grimacing with pain. Following up regarding complex medical decision making.   Subjective:  Extensive review of EMR prior to presenting to bedside.  Also discussed care with RN and hospitalist for updates.  Resented to bedside in afternoon when 4 children present including patient's daughter and 3 sons (1 son via FaceTime).  Reduced myself as a member of the palliative medicine team.  Spent time answering questions as able about patient's overall disease prognosis.  Discussed normal progression when someone is reaching the end of life.  Spent time reviewing patient's modified barium swallow and how patient is aspirating even on saliva.  Discussed there is no medical intervention to fix aspiration.  Discussed interventions such as NG tube and PEG tube would not prevent aspiration and would instead cause patient to aspirate on tube feeds.  Spent time discussing idea of comfort focused feeding knowing that patient will aspirate though accepting of this to provide patient comfort and happiness in time with family.  Discussed also how IV fluids can go into inappropriate places without the proper protein to hold them in the vessels as needed.  Discussed swelling, which is already present in patient's left arm, and fluid can go into lungs causing worsening shortness of breath and accumulation of saliva causing worsening aspiration.  Spent time answering questions as able regarding this. Son asking about overall prognosis.  Expressed concern that patient likely would have already passed away if it were not for medical  interventions, which would have been the natural course at end-of-life.  Discussed at this time concern that patient's overall prognosis is still days to weeks.  Discussed possible pathways that can cause end-of-life such as kidney dysfunction and aspiration pneumonia.  Family expressed concern over wanting to make sure that all appropriate medicine had been attempted.  Acknowledged that appropriate medical interventions have been attempted and also family should consider what quality of life would look like to the patient and how she would want to spend her time with family.  Patient's older and planning to discuss information amongst themselves.  Again answer questions as able and noted palliative medicine team and continue to follow along with patient's journey.  Review of Systems  Objective:   Vital Signs:  BP (!) 142/111 (BP Location: Right Arm)   Pulse 79   Temp (!) 97.5 F (36.4 C) (Axillary)   Resp 18   Ht 5\' 6"  (1.676 m)   Wt 34.5 kg   SpO2 91%   BMI 12.28 kg/m   Physical Exam: General: Chronically ill-appearing, frail, cachectic, grimacing at 1 point though otherwise lethargic HENT: Dry mucous membranes Cardiovascular: RRR Respiratory: no increased work of breathing noted, not in respiratory distress Abdomen: not distended Extremities: Muscle wasting in all extremities Neuro: Lethargic though grimacing with pain once  Imaging: I personally reviewed recent imaging.   Assessment & Plan:   Assessment: Patient is a 89 year old female with a past medical history of HFrEF 25-30%, sinus bradycardia, hypertension, CKD, diabetes, and cognitive impairment who was admitted on 08/22/2023 from skilled nursing facility with decreased responsiveness.  During hospitalization patient has received management for aspiration  pneumonia in the setting of known dysphagia.  Hospital course complicated by electrolyte abnormalities, severe hypovolemic hypernatremia deemed secondary to dehydration  and poor oral intake noted to have lactic acidosis and AKI.  Palliative medicine team consulted to assist with complex medical decision making.  Recommendations/Plan: # Complex medical decision making/goals of care:  - Patient unable to participate in complex medical decision-making due to underlying medical status.  -Spoke with patient's daughter and 3 sons (1 via FaceTime) at bedside.  I spent extensive time discussing normal progression and deterioration when someone is reaching the end of life.  Spent time explaining how aggressive medical interventions such as IV fluids and nasogastric/PEG tubes would not change overall outcome and could instead cause harm by causing further aspiration in a rapid fashion on tube feeds or accumulation of fluid in lungs.  Family wanting to continue with IV fluids at this time while they discuss possible next steps for patient care.  -  Code Status: Limited: Do not attempt resuscitation (DNR) -DNR-LIMITED -Do Not Intubate/DNI   # Symptom management:  Pain Family mentioned great concern over use of morphine.    -Discontinue IV morphine.   -Start IV dilaudid 0.2mg  q3hrs prn for pain if family agrees to administration.   # Psychosocial Support:  -one daughter, 3 sons  # Discharge Planning: To Be Determined  Discussed with: patient's 4 children, RN, hospitalist, chaplain  Thank you for allowing the palliative care team to participate in the care Deloise S Mannina.  Alvester Morin, DO Palliative Care Provider PMT # [phone]  If patient remains symptomatic despite maximum doses, please call PMT at [phone] between 0700 and 1900. Outside of these hours, please call attending, as PMT does not have night coverage.  Personally spent 58 minutes in patient care including extensive chart review (labs, imaging, progress/consult notes, vital signs), medically appropraite exam, discussed with treatment team, education to patient, family, and staff, documenting  clinical information, medication review and management, coordination of care, and available advanced directive documents.   *Please note that this is a verbal dictation therefore any spelling or grammatical errors are due to the "[employer]agon [employer] One" system interpretation.

## 2023-08-28 NOTE — Plan of Care (Signed)
  Problem: Elimination: Goal: Will not experience complications related to bowel motility Outcome: Progressing Goal: Will not experience complications related to urinary retention Outcome: Progressing   Problem: Safety: Goal: Ability to remain free from injury will improve Outcome: Progressing   Problem: Skin Integrity: Goal: Risk for impaired skin integrity will decrease Outcome: Progressing   

## 2023-08-28 NOTE — TOC Progression Note (Addendum)
Transition of Care [facility]) - Progression Note    Patient Details  Name: Alicia Vang MRN: [id number] Date of Birth: [birth year]  [facility] CM/SW Contact  Darleene Cleaver, [place] Phone Number: 08/28/2023, 11:21 AM  Clinical Narrative:     Patient is under short term rehab at [facility].  Family are thinking about having patient return to SNF with hospice verse going to hospice facility.  TOC to follow up with patient and family.  7:10pm CSW spoke to patient's daughter [name], [phone] to discuss discharge planning.  Patient's daughter expressed they are trying to decide what to do in regards to patient's treatment options.  CSW explained to her that if she decides to go to [facility] with hospice services they will require one month payment up front.  Patient's daughter asked about if insurance would pay for it, and CSW explained because patient has [facility], she would have to be able to participate with therapy and get approval.  CSW explained to her that chances are patient will not be approved for SNF especially since she is not able to work with therapy anymore.  CSW spoke to [place] from [facility] and was told there is still not a bed available if patient's family wanted to pursue [facility].  Patient's daughter then asked what about home with hospice and what services would be provided.    CSW informed her that [facility] would have to talk to her to discuss what is available for home hospice.  Patient's daughter stated they are trying to decide how much treatment they want to continue for patient.  Patient's daughter expressed that she and the rest of the family are having a hard time trying to decide what is best for patient.  CSW listened to what she was saying and expressed it is a hard decision to make.  Per patient's daughter she will speak to her siblings and discuss what they want to do.  TOC to continue to follow patient throughout discharge  planning.   Expected Discharge Plan: Hospice Medical Facility Barriers to Discharge: Continued Medical Work up  Expected Discharge Plan and Services In-house Referral: Hospice / Palliative Care, Chaplain Discharge Planning Services: CM Consult Post Acute Care Choice: Hospice Living arrangements for the past 2 months: Skilled Nursing Facility                 DME Arranged: N/A DME Agency: NA       HH Arranged: NA HH Agency: NA         Social Determinants of Health (SDOH) Interventions SDOH Screenings   Food Insecurity: No Food Insecurity (08/23/2023)  Housing: Low Risk  (08/23/2023)  Transportation Needs: No Transportation Needs (08/23/2023)  Utilities: Not At Risk (08/23/2023)  Tobacco Use: Low Risk  (07/25/2023)    Readmission Risk Interventions    08/24/2023    1:31 PM  Readmission Risk Prevention Plan  Transportation Screening Complete  PCP or Specialist Appt within 3-5 Days Complete  HRI or Home Care Consult Complete  Social Work Consult for Recovery Care Planning/Counseling Complete  Palliative Care Screening Complete  Medication Review [employer]) Complete

## 2023-08-28 NOTE — Progress Notes (Signed)
Chaplain engaged in a follow-up visit with Alicia Vang and her family. Chaplain offered supportive and compassionate presence. They shared their beliefs around Macenzie's current state and were in a state of processing concerning what to do next. Chaplain offered prayer over Alicia Vang with family at bedside.     08/28/23 1600  Spiritual Encounters  Type of Visit Follow up  Care provided to: Pt and family  Interventions  Spiritual Care Interventions Made Prayer;Compassionate presence;Reflective listening;Normalization of emotions;Explored values/beliefs/practices/strengths  Intervention Outcomes  Outcomes Awareness of support;Connection to spiritual care

## 2023-08-28 NOTE — Progress Notes (Signed)
[facility]                                                                               Alicia Vang, is a 89 y.o. female, DOB - 1927-07-19, ZOX:[id number] Admit date - 08/22/2023    Outpatient Primary MD for the patient is Eloisa Northern, MD  LOS - 6  days    Brief summary    Alicia Vang is a 89 y.o. female with medical history significant for newly diagnosed HFrEF 25 to 30% (2D echo 07/26/23), concerns for infiltrative cardiomyopathy from last TTE, history of sinus bradycardia, hypertension, CKD 3B, DM2, cognitive impairment, who presents to the ED from SNF due to decreasing responsiveness for the past 2 days.  The patient was recently admitted for acute HFrEF and discharged on 08/09/2023 to SNF.  Reportedly, since her discharge from the hospital she has had minimal food intake.  Nursing home staff noticed that for the past 2 days prior to admission she has been less responsive than usual and stopped eating.  She was brought into the ED for further evaluation.   She was found to have aspiration pneumonia. Despite fluids and IV antibiotics she continued to do poorly, palliative care consulted and she was transitioned to comfort measures as per their request.   Family is thinking about having the patient return to SNF with hospice vs going to residential hospital facility.  Meanwhile though all the family members agree on keeping the patient on comfort measures, they have some concerns regarding hydration and nutrition. They are having further discussions among themselves to see when they can stop the IV fluids and not pursue any nutrition. Palliative is on board and have had extensive discussions every day regarding her terminal care.  We are currently waiting for them to decide so we can pursue her disposition.   Assessment & Plan    Assessment and Plan:   Failure to thrive probably secondary to progressive cognitive impairment, dysphagia and  dehydration. Palliative care consulted to establish goals of care. She's transitioned to comfort measures.  Patient totally unresponsive today, and she was choking on comfort feeds.  Family is concerned about starving and dehydration and wanted to start her on IV fluids understanding the risks of fluid overload in the setting of systolic CHF.  They are having discussions among themselves and with palliative care to see if  a trial of NGT/ Cortrak and tube feeds for a couple of days would benefit her in any way.     Severe hypovolemic hypernatremia secondary to dehydration from poor oral intake Free water deficit Patient presented with a sodium of 162, improved to 154   Aspiration pneumonia in the setting of dysphagia present on admission Patient was started on IV antibiotics, later on discontinued. SLP evalution.   Lactic acidosis Probably secondary to severe dehydration.    Acute kidney injury probably secondary to dehydration from poor oral intake Her baseline creatinine appears to be around 1 and she was admitted with a creatinine of 2.57, improved to 1.67 with IV fluids.  UA is negative for leukocytes and nitrites with few bacteria.     Recent diagnosis of heart failure with  reduced ejection fraction 25 to 30% by TTE done on July 26, 2023.  Hypotension Resolved.    Type 2 diabetes mellitus with hyperglycemia.    Concerns of infiltrative cardiomyopathy seen on last 2D echo done on 07/26/2023 History of sinus bradycardia PACs [place]kebach During previous admission, per cardiology's note, given the patient's comorbidities and bedbound status she is not a candidate for aggressive care.    In view of her declining health, recurrent admissions, aspiration pneumonia , dysphagia, she has overall poor prognosis Palliative care consulted for goals of care, transitioned to comfort measures.     RN Pressure Injury Documentation: Pressure Injury 08/23/23 Sacrum Mid;Medial  Deep Tissue Pressure Injury - Purple or maroon localized area of discolored intact skin or blood-filled blister due to damage of underlying soft tissue from pressure and/or shear. red, purple (Active)  08/23/23 1716  Location: Sacrum  Location Orientation: Mid;Medial  Staging: Deep Tissue Pressure Injury - Purple or maroon localized area of discolored intact skin or blood-filled blister due to damage of underlying soft tissue from pressure and/or shear.  Wound Description (Comments): red, purple  Present on Admission: Yes  Dressing Type Foam - Lift dressing to assess site every shift 08/28/23 0830     Pressure Injury 08/23/23 Ankle Posterior;Right;Lateral Stage 2 -  Partial thickness loss of dermis presenting as a shallow open injury with a red, pink wound bed without slough. red (Active)  08/23/23 1716  Location: Ankle  Location Orientation: Posterior;Right;Lateral  Staging: Stage 2 -  Partial thickness loss of dermis presenting as a shallow open injury with a red, pink wound bed without slough.  Wound Description (Comments): red  Present on Admission: Yes  Dressing Type Foam - Lift dressing to assess site every shift 08/28/23 0830   Estimated body mass index is 13.64 kg/m as calculated from the following:   Height as of this encounter: 5\' 6"  (1.676 m).   Weight as of this encounter: 38.3 kg.  Code Status: DNR DVT Prophylaxis:     Level of Care: Level of care: Palliative Care Family Communication: multiple family members at bedside.   Disposition Plan:     Remains inpatient appropriate: [facility] with hospice vs residential hospice.   Procedures:  None  Consultants:   Palliative  Antimicrobials:   Anti-infectives (From admission, onward)    Start     Dose/Rate Route Frequency Ordered Stop   08/23/23 1900  ceFEPIme (MAXIPIME) 1 g in sodium chloride 0.9 % 100 mL IVPB  Status:  Discontinued        1 g 200 mL/hr over 30 Minutes Intravenous Every 24 hours 08/22/23 1938  08/24/23 1204   08/22/23 1645  ceFEPIme (MAXIPIME) 2 g in sodium chloride 0.9 % 100 mL IVPB        2 g 200 mL/hr over 30 Minutes Intravenous  Once 08/22/23 1629 08/22/23 1847   08/22/23 1630  vancomycin (VANCOCIN) IVPB 1000 mg/200 mL premix  Status:  Discontinued        1,000 mg 200 mL/hr over 60 Minutes Intravenous  Once 08/22/23 1624 08/22/23 1625   08/22/23 1630  vancomycin (VANCOREADY) IVPB 750 mg/150 mL        750 mg 150 mL/hr over 60 Minutes Intravenous  Once 08/22/23 1626 08/22/23 2015        Medications  Scheduled Meds:  Chlorhexidine Gluconate Cloth  6 each Topical Daily   mouth rinse  15 mL Mouth Rinse 4 times per day   Continuous Infusions:  sodium chloride  40 mL/hr at 08/28/23 1700    PRN Meds:.acetaminophen, glycopyrrolate, morphine injection, mouth rinse, polyethylene glycol, prochlorperazine    Subjective:   Alicia Vang was seen and examined today. She remains unresponsive.   Objective:   Vitals:   08/27/23 0428 08/28/23 0700 08/28/23 0716 08/28/23 1420  BP: (!) 141/57 (!) 142/111  139/73  Pulse: (!) 58 79  (!) 56  Resp: 18 18    Temp: 97.7 F (36.5 C) (!) 97.5 F (36.4 C)    TempSrc: Oral Axillary    SpO2: 91% 91%    Weight:   38.3 kg   Height:        Intake/Output Summary (Last 24 hours) at 08/28/2023 1735 Last data filed at 08/28/2023 1700 Gross per 24 hour  Intake 767.94 ml  Output --  Net 767.94 ml    Filed Weights   08/23/23 1550 08/23/23 1600 08/28/23 0716  Weight: 34.5 kg 34.5 kg 38.3 kg     Exam  General exam: Ill appearing frail elderly lady cachetic looking. , remains totally unresponsive.  Respiratory system:diminished at bases, on RA.  Cardiovascular system: S1 & S2 heard, RRR.  Gastrointestinal system: Abdomen is soft  Central nervous system: unresponsive.  Extremities: no edema.  Skin: No rashes, Psychiatry: unable to assess.         Data Reviewed:  I have personally reviewed following labs and imaging  studies   CBC Lab Results  Component Value Date   WBC 3.8 (L) 08/24/2023   RBC 3.54 (L) 08/24/2023   HGB 10.6 (L) 08/24/2023   HCT 35.5 (L) 08/24/2023   MCV 100.3 (H) 08/24/2023   MCH 29.9 08/24/2023   PLT 102 (L) 08/24/2023   MCHC 29.9 (L) 08/24/2023   RDW 16.1 (H) 08/24/2023   LYMPHSABS 0.4 (L) 08/24/2023   MONOABS 0.2 08/24/2023   EOSABS 0.0 08/24/2023   BASOSABS 0.0 08/24/2023     Last metabolic panel Lab Results  Component Value Date   NA 154 (H) 08/24/2023   K 3.4 (L) 08/24/2023   CL 128 (H) 08/24/2023   CO2 23 08/24/2023   BUN 77 (H) 08/24/2023   CREATININE 1.67 (H) 08/24/2023   GLUCOSE 170 (H) 08/24/2023   GFRNONAA 28 (L) 08/24/2023   GFRAA 59 (L) 10/28/2017   CALCIUM 8.5 (L) 08/24/2023   PHOS 3.7 08/23/2023   PROT 7.8 08/22/2023   ALBUMIN 3.7 08/22/2023   BILITOT 1.1 08/22/2023   ALKPHOS 48 08/22/2023   AST 20 08/22/2023   ALT 18 08/22/2023   ANIONGAP 3 (L) 08/24/2023    CBG (last 3)  No results for input(s): "GLUCAP" in the last 72 hours.     Coagulation Profile: No results for input(s): "INR", "PROTIME" in the last 168 hours.   Radiology Studies: No results found.     Kathlen Mody M.D. [facility] 08/28/2023, 5:35 PM  Available via Epic secure chat 7am-7pm After 7 pm, please refer to night coverage provider listed on amion.

## 2023-08-28 NOTE — Plan of Care (Signed)
  Problem: Clinical Measurements: Goal: Ability to maintain clinical measurements within normal limits will improve Outcome: Progressing Goal: Will remain free from infection Outcome: Progressing Goal: Diagnostic test results will improve Outcome: Progressing Goal: Respiratory complications will improve Outcome: Progressing Goal: Cardiovascular complication will be avoided Outcome: Progressing   Problem: Elimination: Goal: Will not experience complications related to bowel motility Outcome: Progressing Goal: Will not experience complications related to urinary retention Outcome: Progressing   Problem: Pain Management: Goal: General experience of comfort will improve Outcome: Progressing   Problem: Safety: Goal: Ability to remain free from injury will improve Outcome: Progressing   Problem: Skin Integrity: Goal: Risk for impaired skin integrity will decrease Outcome: Progressing

## 2023-08-29 DIAGNOSIS — Z7189 Other specified counseling: Secondary | ICD-10-CM | POA: Diagnosis not present

## 2023-08-29 DIAGNOSIS — T17908S Unspecified foreign body in respiratory tract, part unspecified causing other injury, sequela: Secondary | ICD-10-CM | POA: Diagnosis not present

## 2023-08-29 DIAGNOSIS — Z515 Encounter for palliative care: Secondary | ICD-10-CM | POA: Diagnosis not present

## 2023-08-29 DIAGNOSIS — R627 Adult failure to thrive: Secondary | ICD-10-CM | POA: Diagnosis not present

## 2023-08-29 NOTE — Hospital Course (Addendum)
newly diagnosed HFrEF 25 to 30% (2D echo 07/26/23), concerns for infiltrative cardiomyopathy from last TTE, history of sinus bradycardia, hypertension, CKD 3B, DM2, cognitive impairment, who presents to the ED from SNF due to decreasing responsiveness for the past 2 days.  The patient was recently admitted for acute HFrEF and discharged on 08/09/2023 to SNF.  Reportedly, since her discharge from the hospital she has had minimal food intake.  Nursing home staff noticed that for the past 2 days prior to admission she has been less responsive than usual and stopped eating.  She was brought into the ED for further evaluation.  She was found to have aspiration pneumonia. Despite fluids and IV antibiotics she continued to do poorly, palliative care consulted and she was transitioned to comfort measures as per their request.  Family is thinking about having the patient return to SNF with hospice vs going to residential hospital facility.  Meanwhile though all the family members agree on keeping the patient on comfort measures, they have some concerns regarding hydration and nutrition. They are having further discussions among themselves to see when they can stop the IV fluids and not pursue any nutrition. Palliative is on board and have had extensive discussions every day regarding her terminal care and awaiting for family decide for disposition.

## 2023-08-29 NOTE — Progress Notes (Signed)
Nutrition Note  MD reached out regarding possible plans for feeding tube placement on 11/4.  Visited patient and spoke with family 11/5, tube had still not been placed and family deciding next steps.  Palliative care following for GOC.  Per MD note today, anticipate hospital death and orders for tube placements have been d/c. RD to monitor for changes in plan or status.   Labs and medications reviewed.   No nutrition interventions warranted at this time.   Tilda Franco, MS, RD, LDN Inpatient Clinical Dietitian Contact information available via Amion

## 2023-08-29 NOTE — Progress Notes (Signed)
Daily Progress Note   Patient Name: Alicia Vang       Date: 08/29/2023 DOB: [birth year]  Age: 89 y.o. MRN#: [id number] Attending Physician: Lanae Boast, MD Primary Care Physician: Eloisa Northern, MD Admit Date: 08/22/2023 Length of Stay: 7 days  Reason for Consultation/Follow-up: Establishing goals of care  Subjective:   CC: Patient remains unresponsive in bed with eyes slightly open. Following up regarding complex medical decision making.   Subjective:  Reviewed EMR prior to presenting to bedside.  Discussed care with bedside RN for medical updates.  Two of patient's sons and daughter-in-law were present at bedside.  Patient seen laying in the bed.  Patient remains unresponsive with eyes slightly open.  On examination of patient, has increased left arm swelling and color changes from fluid buildup IV fluids. Discussed care with family present at bedside.  Inquired about family discussion yesterday evening.  Again spent time answering questions regarding patient's overall medical care and being at the end of life.  Family again inquiring about prognosis which noted with continuation of IV fluids would likely be days to weeks.  Spent time again reviewing about feeding tubes as family had discussed this and they have opted that they do not want to proceed with NG or PEG tube placement as realized patient would aspirate on tube feeds causing worsening symptom burden and infection.  Family wanting to continue IV fluids at this time; discussed with remain NaCl, not glucose in IVF.   Again reviewed risk such as swelling and increased pain from IV fluids.  Family wanting to continue so notified hospitalist.  Family inquiring about the next steps moving forward.  Noted patient is not appropriate for rehab and she is at the end of life.  Family has out right stated they "do not want hospice".  Discussed likelihood of patient dying here in the hospital.  All questions answered at that time.  Noted  palliative medicine team will continue to follow along and engage in conversations as able.  Objective:   Vital Signs:  BP (!) 169/67 (BP Location: Right Arm)   Pulse (!) 49   Temp (!) 97.5 F (36.4 C) (Axillary)   Resp 16   Ht 5\' 6"  (1.676 m)   Wt 38.7 kg   SpO2 100%   BMI 13.77 kg/m   Physical Exam: General: Chronically ill-appearing, frail, cachectic, eyes remain slightly open though not responding  HENT: Dry mucous membranes Cardiovascular: RRR Respiratory: no increased work of breathing noted, not in respiratory distress Abdomen: not distended Extremities: Muscle wasting in all extremities  Imaging: I personally reviewed recent imaging.   Assessment & Plan:   Assessment: Patient is a 89 year old female with a past medical history of HFrEF 25-30%, sinus bradycardia, hypertension, CKD, diabetes, and cognitive impairment who was admitted on 08/22/2023 from skilled nursing facility with decreased responsiveness.  During hospitalization patient has received management for aspiration pneumonia in the setting of known dysphagia.  Hospital course complicated by electrolyte abnormalities, severe hypovolemic hypernatremia deemed secondary to dehydration and poor oral intake noted to have lactic acidosis and AKI.  Palliative medicine team consulted to assist with complex medical decision making.  Recommendations/Plan: # Complex medical decision making/goals of care:  - Patient unable to participate in complex medical decision-making due to underlying medical status.  -Spoke with patient's 2 sons and daughter-in-law at bedside. (Had spoke with all 4 children on 11/5).  Again review patient appears to be at end of life. Family has decided NOT to pursue nasogastric/PEG  tubes as would not change overall outcome and could instead cause harm by causing further aspiration in a rapid fashion on tube feeds or accumulation of fluid in lungs. IR consult has been appropriately canceled. Family  wanting to continue with NaCl only IV fluids at this time. Stated today "do not want hospice" so noted in hospital death likely.   -  Code Status: Limited: Do not attempt resuscitation (DNR) -DNR-LIMITED -Do Not Intubate/DNI   # Symptom management:  Pain Family mentioned great concern over use of morphine.   -Has  IV dilaudid 0.2mg  q3hrs prn for pain though family has been reluctant to provide medication for pain. Spent time describing signs of nonverbal pain to monitor for today.  # Psychosocial Support:  -one daughter, 3 sons  # Discharge Planning: Anticipated Hospital Death based on current assessment and discussion with family  Discussed with: patient's 2 children, RN, hospitalist, chaplain  Thank you for allowing the palliative care team to participate in the care Mikhala S Mcmonigle.  Alvester Morin, DO Palliative Care Provider PMT # [phone]  If patient remains symptomatic despite maximum doses, please call PMT at [phone] between 0700 and 1900. Outside of these hours, please call attending, as PMT does not have night coverage.  Personally spent 40 minutes in patient care including extensive chart review (labs, imaging, progress/consult notes, vital signs), medically appropraite exam, discussed with treatment team, education to patient, family, and staff, documenting clinical information, medication review and management, coordination of care, and available advanced directive documents.   *Please note that this is a verbal dictation therefore any spelling or grammatical errors are due to the "[employer]" system interpretation.

## 2023-08-29 NOTE — Progress Notes (Signed)
PROGRESS NOTE Alicia Vang  WJX:[id number] DOB: December 21, 1926 DOA: 08/22/2023 PCP: Eloisa Northern, MD  Brief Narrative/Hospital Course:  newly diagnosed HFrEF 25 to 30% (2D echo 07/26/23), concerns for infiltrative cardiomyopathy from last TTE, history of sinus bradycardia, hypertension, CKD 3B, DM2, cognitive impairment, who presents to the ED from SNF due to decreasing responsiveness for the past 2 days.  The patient was recently admitted for acute HFrEF and discharged on 08/09/2023 to SNF.  Reportedly, since her discharge from the hospital she has had minimal food intake.  Nursing home staff noticed that for the past 2 days prior to admission she has been less responsive than usual and stopped eating.  She was brought into the ED for further evaluation.  She was found to have aspiration pneumonia. Despite fluids and IV antibiotics she continued to do poorly, palliative care consulted and she was transitioned to comfort measures as per their request.  Family is thinking about having the patient return to SNF with hospice vs going to residential hospital facility.  Meanwhile though all the family members agree on keeping the patient on comfort measures, they have some concerns regarding hydration and nutrition. They are having further discussions among themselves to see when they can stop the IV fluids and not pursue any nutrition. Palliative is on board and have had extensive discussions every day regarding her terminal care and awaiting for family decide for disposition.     Subjective: Patient seen and examined, eyes closed did not respond at all Son and daughter at bedside   Assessment and Plan: Principal Problem:   Adult failure to thrive Active Problems:   Palliative care encounter   AKI (acute kidney injury) (HCC)   Hypernatremia   Goals of care, counseling/discussion   Need for emotional support   Counseling and coordination of care   Aspiration into airway   Failure to thrive  secondary to progressive cognitive impairment, dysphagia and dehydration: Hypovolemic hyponatremia severe Lactic acidosis Hypotension due to hypovolemia AKI: Extensive goals of care has been discussed, patient with complex medical co-morbidities. PMT following. Family hesitant to stop IV fluids at this time keeping on gentle saline infusion without dextrose.,  Continue supportive care oral care. Patient is at risk of decompensation expecting hospital death.  Family undecided on disposition at this time.  Cardiomyopathy recent diagnosis with CHF EF 25-30%: Concerns of infiltrative cardiomyopathy seen on last 2D echo done on 07/26/2023 History of sinus bradycardia PACs Wenckebach: Remains dehydrated hypovolemic, at risk of fluid overload on fluids per family request. During previous admission, per cardiology's note, given the patient's comorbidities and bedbound status she is not a candidate for aggressive care.  Aspiration pneumonia in the setting of dysphagia present on admission: Unsafe for oral intake. S/p antibiotics.  Hypotension Resolved.    Type 2 diabetes mellitus with hyperglycemia: Cbg stable Recent Labs  Lab 08/23/23 1629 08/23/23 2043 08/24/23 0043 08/24/23 0339 08/24/23 0843  GLUCAP 135* 157* 152* 149* 117*    Pressure Injury: POA as below  Pressure Injury 08/23/23 Sacrum Mid;Medial Deep Tissue Pressure Injury - Purple or maroon localized area of discolored intact skin or blood-filled blister due to damage of underlying soft tissue from pressure and/or shear. red, purple (Active)  08/23/23 1716  Location: Sacrum  Location Orientation: Mid;Medial  Staging: Deep Tissue Pressure Injury - Purple or maroon localized area of discolored intact skin or blood-filled blister due to damage of underlying soft tissue from pressure and/or shear.  Wound Description (Comments): red, purple  Present on  Admission: Yes  Dressing Type Foam - Lift dressing to assess site every shift  08/29/23 0900     Pressure Injury 08/23/23 Ankle Posterior;Right;Lateral Stage 2 -  Partial thickness loss of dermis presenting as a shallow open injury with a red, pink wound bed without slough. red (Active)  08/23/23 1716  Location: Ankle  Location Orientation: Posterior;Right;Lateral  Staging: Stage 2 -  Partial thickness loss of dermis presenting as a shallow open injury with a red, pink wound bed without slough.  Wound Description (Comments): red  Present on Admission: Yes  Dressing Type Foam - Lift dressing to assess site every shift 08/29/23 0900   DVT prophylaxis: none for comfort Code Status:   Code Status: Limited: Do not attempt resuscitation (DNR) -DNR-LIMITED -Do Not Intubate/DNI  Family Communication: plan of care discussed with patient/so nand DIL at bedside. Patient status is:  inpatient because of failure to thrive Level of care: Palliative Care   Dispo: The patient is from: home            Anticipated disposition: Anticipating hospital death  Objective: Vitals last 24 hrs: Vitals:   08/28/23 0716 08/28/23 1420 08/29/23 0500 08/29/23 0608  BP:  139/73  (!) 169/67  Pulse:  (!) 56  (!) 49  Resp:    16  Temp:      TempSrc:      SpO2:    100%  Weight: 38.3 kg  38.7 kg   Height:       Weight change:  Physical Examination: General exam: Unresponsive  HEENT:Oral mucosa moist, Ear/Nose WNL grossly Respiratory system: bilaterally clear BS, no use of accessory muscle Cardiovascular system: S1 & S2 +, No JVD. Gastrointestinal system: Abdomen soft,NT,ND, BS+ Nervous System: No meaningful interaction or response  Extremities: LE edema neg,distal peripheral pulses palpable.  Skin: No rashes,no icterus. MSK: thin muscle bulk,tone, power  Medications reviewed:  Scheduled Meds:  Chlorhexidine Gluconate Cloth  6 each Topical Daily   mouth rinse  15 mL Mouth Rinse 4 times per day   Continuous Infusions:  sodium chloride 40 mL/hr at 08/29/23 1305   Diet Order              Diet NPO time specified  Diet effective now                   Intake/Output Summary (Last 24 hours) at 08/29/2023 1348 Last data filed at 08/29/2023 0900 Gross per 24 hour  Intake 767.94 ml  Output --  Net 767.94 ml   Net IO Since Admission: 4,345.03 mL [08/29/23 1348]  Wt Readings from Last 3 Encounters:  08/29/23 38.7 kg  08/09/23 39.8 kg  07/02/22 58.1 kg     Unresulted Labs (From admission, onward)    None     Data Reviewed: I have personally reviewed following labs and imaging studies CBC: Recent Labs  Lab 08/22/23 1538 08/23/23 0433 08/24/23 0053  WBC 5.8 4.5 3.8*  NEUTROABS 4.7  --  3.3  HGB 12.7 12.5 10.6*  HCT 42.3 42.8 35.5*  MCV 99.8 101.7* 100.3*  PLT 146* 116* 102*   Basic Metabolic Panel: Recent Labs  Lab 08/22/23 1538 08/22/23 2103 08/23/23 0243 08/23/23 0433 08/24/23 0053  NA 162* 159* 159* 160* 154*  K 4.2 4.5 4.2 3.5 3.4*  CL 122* 123* 123* 124* 128*  CO2 27 25 25 27 23   GLUCOSE 334* 302* 285* 252* 170*  BUN 83* 82* 83* 88* 77*  CREATININE 2.57* 2.43* 2.16* 2.29* 1.67*  CALCIUM 10.1 8.7* 8.8* 8.9 8.5*  MG  --   --   --  2.8*  --   PHOS  --   --   --  3.7  --    GFR: Estimated Creatinine Clearance: 12.9 mL/min (A) (by C-G formula based on SCr of 1.67 mg/dL (H)). Liver Function Tests: Recent Labs  Lab 08/22/23 1538  AST 20  ALT 18  ALKPHOS 48  BILITOT 1.1  PROT 7.8  ALBUMIN 3.7  CBG: Recent Labs  Lab 08/23/23 1629 08/23/23 2043 08/24/23 0043 08/24/23 0339 08/24/23 0843  GLUCAP 135* 157* 152* 149* 117*   Recent Labs  Lab 08/23/23 0201 08/23/23 0433 08/23/23 1623 08/24/23 0053 08/24/23 0317  PROCALCITON  --  0.82  --   --   --   LATICACIDVEN 3.3*  --  2.9* 1.6 1.7   Recent Results (from the past 240 hour(s))  MRSA Next Gen by PCR, Nasal     Status: None   Collection Time: 08/22/23  8:46 PM   Specimen: Nasal Mucosa; Nasal Swab  Result Value Ref Range Status   MRSA by PCR Next Gen NOT DETECTED NOT  DETECTED Final    Comment: (NOTE) The GeneXpert MRSA Assay (FDA approved for NASAL specimens only), is one component of a comprehensive MRSA colonization surveillance program. It is not intended to diagnose MRSA infection nor to guide or monitor treatment for MRSA infections. Test performance is not FDA approved in patients less than 11 years old. Performed at [facility], [address], [place], [place] [address]   MRSA Next Gen by PCR, Nasal     Status: None   Collection Time: 08/23/23  4:09 PM   Specimen: Nasal Mucosa; Nasal Swab  Result Value Ref Range Status   MRSA by PCR Next Gen NOT DETECTED NOT DETECTED Final    Comment: (NOTE) The GeneXpert MRSA Assay (FDA approved for NASAL specimens only), is one component of a comprehensive MRSA colonization surveillance program. It is not intended to diagnose MRSA infection nor to guide or monitor treatment for MRSA infections. Test performance is not FDA approved in patients less than 60 years old. Performed at [facility], [address] [place], [place] [address]     Antimicrobials: Anti-infectives (From admission, onward)    Start     Dose/Rate Route Frequency Ordered Stop   08/23/23 1900  ceFEPIme (MAXIPIME) 1 g in sodium chloride 0.9 % 100 mL IVPB  Status:  Discontinued        1 g 200 mL/hr over 30 Minutes Intravenous Every 24 hours 08/22/23 1938 08/24/23 1204   08/22/23 1645  ceFEPIme (MAXIPIME) 2 g in sodium chloride 0.9 % 100 mL IVPB        2 g 200 mL/hr over 30 Minutes Intravenous  Once 08/22/23 1629 08/22/23 1847   08/22/23 1630  vancomycin (VANCOCIN) IVPB 1000 mg/200 mL premix  Status:  Discontinued        1,000 mg 200 mL/hr over 60 Minutes Intravenous  Once 08/22/23 1624 08/22/23 1625   08/22/23 1630  vancomycin (VANCOREADY) IVPB 750 mg/150 mL        750 mg 150 mL/hr over 60 Minutes Intravenous  Once 08/22/23 1626 08/22/23 2015      Culture/Microbiology    Component  Value Date/Time   SDES BLOOD LEFT ARM 07/01/2022 1548   SPECREQUEST  07/01/2022 1548    BOTTLES DRAWN AEROBIC AND ANAEROBIC Blood Culture adequate volume   CULT  07/01/2022  1548    NO GROWTH 5 DAYS Performed at [facility], [address], [place], [place] [address]    REPTSTATUS 07/06/2022 FINAL 07/01/2022 1548    Radiology Studies: No results found.   LOS: 7 days   Lanae Boast, MD [facility]  08/29/2023, 1:48 PM

## 2023-08-30 DIAGNOSIS — I5022 Chronic systolic (congestive) heart failure: Secondary | ICD-10-CM

## 2023-08-30 DIAGNOSIS — Z7189 Other specified counseling: Secondary | ICD-10-CM | POA: Diagnosis not present

## 2023-08-30 DIAGNOSIS — Z515 Encounter for palliative care: Secondary | ICD-10-CM | POA: Diagnosis not present

## 2023-08-30 DIAGNOSIS — T17908S Unspecified foreign body in respiratory tract, part unspecified causing other injury, sequela: Secondary | ICD-10-CM | POA: Diagnosis not present

## 2023-08-30 DIAGNOSIS — R627 Adult failure to thrive: Secondary | ICD-10-CM | POA: Diagnosis not present

## 2023-08-30 MED ORDER — SODIUM CHLORIDE 0.9 % IV SOLN: INTRAVENOUS | Status: AC

## 2023-08-30 NOTE — TOC Progression Note (Signed)
Transition of Care [facility]) - Progression Note    Patient Details  Name: Alicia Vang MRN: [id number] Date of Birth: [birth year]  [facility]) CM/SW Contact  Beckie Busing, RN Phone Number:[phone]  08/30/2023, 8:03 PM  Clinical Narrative:    TOC following . Per palliative notes anticipated hospital death. Family does not wish to pursue hospice services.    Expected Discharge Plan: Hospice Medical Facility Barriers to Discharge: Continued Medical Work up  Expected Discharge Plan and Services In-house Referral: Hospice / Palliative Care, Chaplain Discharge Planning Services: CM Consult Post Acute Care Choice: Hospice Living arrangements for the past 2 months: Skilled Nursing Facility                 DME Arranged: N/A DME Agency: NA       HH Arranged: NA HH Agency: NA         Social Determinants of Health (SDOH) Interventions SDOH Screenings   Food Insecurity: No Food Insecurity (08/23/2023)  Housing: Low Risk  (08/23/2023)  Transportation Needs: No Transportation Needs (08/23/2023)  Utilities: Not At Risk (08/23/2023)  Tobacco Use: Low Risk  (07/25/2023)    Readmission Risk Interventions    08/24/2023    1:31 PM  Readmission Risk Prevention Plan  Transportation Screening Complete  PCP or Specialist Appt within 3-5 Days Complete  HRI or Home Care Consult Complete  Social Work Consult for Recovery Care Planning/Counseling Complete  Palliative Care Screening Complete  Medication Review [employer]) Complete

## 2023-08-30 NOTE — Progress Notes (Signed)
Daily Progress Note   Patient Name: Alicia Vang       Date: 08/30/2023 DOB: [birth year]  Age: 89 y.o. MRN#: [id number] Attending Physician: Osvaldo Shipper, MD Primary Care Physician: Eloisa Northern, MD Admit Date: 08/22/2023 Length of Stay: 8 days  Reason for Consultation/Follow-up: Establishing goals of care  Subjective:   CC: Patient remains unresponsive in bed with eyes slightly open. Following up regarding complex medical decision making.   Subjective:  Reviewed EMR prior to presenting to bedside.  Discussed care with bedside RN and hospitalist for medical updates.  Patient's daughter present at bedside.  Patient's son, Les Pou, was present on speaker phone.  Patient lying unresponsive in bed with eyes minimally open.  On examination patient has worsening mottling of hands that are cool to touch.  Lower extremities also cooler to touch.  Answered questions as able.  Daughter inquiring about current management.  Again discussed patient is at the end of life.  IV fluids are not ultimately changing the overall outcome.  Allowing time for family to grieve.  Daughter acknowledged she is having a difficult time with saying goodbye to her mother.  Spent time providing emotional support as able.  Answered all questions at that time.  Noted palliative medicine team will continue to follow along with patient's medical journey.  Objective:   Vital Signs:  BP (!) 116/58 (BP Location: Right Arm)   Pulse (!) 56   Temp (!) 97.5 F (36.4 C) (Axillary)   Resp 15   Ht 5\' 6"  (1.676 m)   Wt 38.4 kg   SpO2 92%   BMI 13.66 kg/m   Physical Exam: General: Chronically ill-appearing, frail, cachectic, eyes remain slightly open though not responsive HENT: Dry mucous membranes Cardiovascular: RRR Respiratory: no increased work of breathing noted, not in respiratory distress Abdomen: not distended Extremities: Muscle wasting in all extremities  Imaging: I personally reviewed recent imaging.    Assessment & Plan:   Assessment: Patient is a 89 year old female with a past medical history of HFrEF 25-30%, sinus bradycardia, hypertension, CKD, diabetes, and cognitive impairment who was admitted on 08/22/2023 from skilled nursing facility with decreased responsiveness.  During hospitalization patient has received management for aspiration pneumonia in the setting of known dysphagia.  Hospital course complicated by electrolyte abnormalities, severe hypovolemic hypernatremia deemed secondary to dehydration and poor oral intake noted to have lactic acidosis and AKI.  Palliative medicine team consulted to assist with complex medical decision making.  Recommendations/Plan: # Complex medical decision making/goals of care:  - Patient unable to participate in complex medical decision-making due to underlying medical status.  -Continue to review with family patient is at end of life. Family has decided NOT to pursue nasogastric/PEG tubes as would not change overall outcome and could instead cause harm by causing further aspiration in a rapid fashion on tube feeds or accumulation of fluid in lungs. IR consult has been appropriately canceled. Family wanting to continue with NaCl only IV fluids at this time. Have already stated "do not want hospice" so noted in hospital death anticipated.   -  Code Status: Limited: Do not attempt resuscitation (DNR) -DNR-LIMITED -Do Not Intubate/DNI   # Symptom management:  Pain Family mentioned great concern over use of morphine.   -Has  IV dilaudid 0.2mg  q3hrs prn for pain though family has been reluctant to provide medication for pain. Spent time describing signs of nonverbal pain to monitor for today.  # Psychosocial Support:  -one daughter, 3 sons  # Discharge Planning:  Anticipated Hospital Death based on current assessment and discussion with family  Discussed with: 2 of patient's children, RN, hospitalist, chaplain  Thank you for allowing the palliative  care team to participate in the care Ife S Arriaga.  Alvester Morin, DO Palliative Care Provider PMT # [phone]  If patient remains symptomatic despite maximum doses, please call PMT at [phone] between 0700 and 1900. Outside of these hours, please call attending, as PMT does not have night coverage.  *Please note that this is a verbal dictation therefore any spelling or grammatical errors are due to the "[employer]" system interpretation.

## 2023-08-30 NOTE — Progress Notes (Signed)
Chaplain engaged in follow-up visit with Dustyn's daughter, [name], offering reflective listening and support as she shared about how hard this process has been for her. Chaplain worked to help [employer] celebrate all that her mom has taught her and left her with while acknowledging the fullness of the life Lamyra has lived. Family has been doing a great deal of processing as they originally thought their mom would pass right away. As Tailor has still been with them it has been hard to decipher what to do but [name] recognizes that her mom is transitioning.   Chaplain engaged in some narrative life review as [name] shared about her life and the ways she has always shown up for her family, and specifically her parents. 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  Chaplain provided space for story-listening and sharing, offered a compassionate presence and support. Yvonda rested throughout time together.    08/30/23 1200  Spiritual Encounters  Type of Visit Follow up  Care provided to: Pt and family  Reason for visit Routine spiritual support

## 2023-08-30 NOTE — Progress Notes (Signed)
[facility] PROGRESS NOTE   Alicia Vang IRJ:[id number] DOB: 04/04/1927 DOA: 08/22/2023  PCP: Eloisa Northern, MD  Brief History:  89 y.o. 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  Patient was recently admitted for acute CHF and was discharged to skilled nursing facility.  Oral intake had been poor at the skilled nursing facility.  There was also some concern for dysphagia.  Patient was hospitalized.  Consultants: Palliative care  Procedures: None   Subjective/Interval History: Patient asleep.  Does not wake up when her name is called.  Has been minimally responsive for the past several days.  Her daughter is at the bedside.    Assessment/Plan:  Failure to thrive secondary to progressive cognitive impairment, dysphagia and dehydration: Lactic acidosis AKI: Patient's prognosis is thought to be poor considering her multiple comorbidities and dysphagia.  Palliative care is following on a daily basis.  Patient has been minimally responsive here in the hospital.  She has been transitioned to DNR status.  She is primarily under comfort care here in the hospital.  Family not able to decide on disposition as yet.     Cardiomyopathy recent diagnosis with CHF EF 25-30%: Concerns of infiltrative cardiomyopathy seen on last 2D echo done on 07/26/2023 History of sinus bradycardia PACs Wenckebach: Poor prognosis overall.  Based on previous evaluation by cardiology patient is not a candidate for aggressive care.     Aspiration pneumonia in the setting of dysphagia present on admission: Completed course of antibiotics.  Remains at high risk for aspiration.   Hypotension Resolved.    Type 2 diabetes mellitus with  hyperglycemia:   DVT Prophylaxis: Comfort care Code Status: DNR Family Communication: Discussed with daughter who was at the bedside Disposition Plan: To be determined     Medications: Scheduled:  Chlorhexidine Gluconate Cloth  6 each Topical Daily   mouth rinse  15 mL Mouth Rinse 4 times per day   Continuous:  sodium chloride 40 mL/hr at 08/30/23 0954   TKZ:[id number], glycopyrrolate, HYDROmorphone (DILAUDID) injection, mouth rinse, polyethylene glycol, prochlorperazine  Antibiotics: Anti-infectives (From admission, onward)    Start     Dose/Rate Route Frequency Ordered Stop   08/23/23 1900  ceFEPIme (MAXIPIME) 1 g in sodium chloride 0.9 % 100 mL IVPB  Status:  Discontinued        1 g 200 mL/hr over 30 Minutes Intravenous Every 24 hours 08/22/23 1938 08/24/23 1204   08/22/23 1645  ceFEPIme (MAXIPIME) 2 g in sodium chloride 0.9 % 100 mL IVPB        2 g 200 mL/hr over 30 Minutes Intravenous  Once 08/22/23 1629 08/22/23 1847   08/22/23 1630  vancomycin (VANCOCIN) IVPB 1000 mg/200 mL premix  Status:  Discontinued        1,000 mg 200 mL/hr over 60 Minutes Intravenous  Once 08/22/23 1624 08/22/23 1625   08/22/23 1630  vancomycin (VANCOREADY) IVPB 750 mg/150 mL        750 mg 150 mL/hr over 60 Minutes Intravenous  Once 08/22/23 1626 08/22/23 2015       Objective:  Vital Signs  Vitals:   08/29/23 0500 08/29/23 0608 08/30/23 0456 08/30/23 0500  BP:  (!) 169/67 (!) 116/58   Pulse:  (!) 49 (!) 56   Resp:  16 15  Temp:   (!) 97.5 F (36.4 C)   TempSrc:   Axillary   SpO2:  100% 92%   Weight: 38.7 kg   38.4 kg  Height:        Intake/Output Summary (Last 24 hours) at 08/30/2023 0955 Last data filed at 08/30/2023 0458 Gross per 24 hour  Intake --  Output 150 ml  Net -150 ml   Filed Weights   08/28/23 0716 08/29/23 0500 08/30/23 0500  Weight: 38.3 kg 38.7 kg 38.4 kg    General appearance: Unresponsive for the most part. Resp: Clear to auscultation bilaterally.   Normal effort Cardio: S1-S2 is normal regular.  No S3-S4.  No rubs murmurs or bruit GI: Abdomen is soft.  Nontender nondistended.  Bowel sounds are present normal.  No masses organomegaly   Lab Results:  Data Reviewed: I have personally reviewed following labs and reports of the imaging studies  CBC: Recent Labs  Lab 08/24/23 0053  WBC 3.8*  NEUTROABS 3.3  HGB 10.6*  HCT 35.5*  MCV 100.3*  PLT 102*    Basic Metabolic Panel: Recent Labs  Lab 08/24/23 0053  NA 154*  K 3.4*  CL 128*  CO2 23  GLUCOSE 170*  BUN 77*  CREATININE 1.67*  CALCIUM 8.5*    GFR: Estimated Creatinine Clearance: 12.8 mL/min (A) (by C-G formula based on SCr of 1.67 mg/dL (H)).    CBG: Recent Labs  Lab 08/23/23 1629 08/23/23 2043 08/24/23 0043 08/24/23 0339 08/24/23 0843  GLUCAP 135* 157* 152* 149* 117*     Recent Results (from the past 240 hour(s))  MRSA Next Gen by PCR, Nasal     Status: None   Collection Time: 08/22/23  8:46 PM   Specimen: Nasal Mucosa; Nasal Swab  Result Value Ref Range Status   MRSA by PCR Next Gen NOT DETECTED NOT DETECTED Final    Comment: (NOTE) The GeneXpert MRSA Assay (FDA approved for NASAL specimens only), is one component of a comprehensive MRSA colonization surveillance program. It is not intended to diagnose MRSA infection nor to guide or monitor treatment for MRSA infections. Test performance is not FDA approved in patients less than 9 years old. Performed at [facility], [address] [place], [place] [address]   MRSA Next Gen by PCR, Nasal     Status: None   Collection Time: 08/23/23  4:09 PM   Specimen: Nasal Mucosa; Nasal Swab  Result Value Ref Range Status   MRSA by PCR Next Gen NOT DETECTED NOT DETECTED Final    Comment: (NOTE) The GeneXpert MRSA Assay (FDA approved for NASAL specimens only), is one component of a comprehensive MRSA colonization surveillance program. It is not intended to diagnose MRSA infection  nor to guide or monitor treatment for MRSA infections. Test performance is not FDA approved in patients less than 46 years old. Performed at [facility], [address], [place], [place] [address]       Radiology Studies: No results found.     LOS: 8 days   Kinser Fellman [employer] on [email or web address]  08/30/2023, 9:55 AM

## 2023-08-30 NOTE — Progress Notes (Deleted)
  Cardiology Office Note:  .   Date:  08/30/2023  ID:  Alicia Vang, DOB 14-Nov-1926, MRN [id number] PCP: Eloisa Northern, MD  [facility] Providers Cardiologist:  Armanda Magic, MD {  History of Present Illness: .   Alicia Vang is a 89 y.o. female with a past medical history of hypertension, Parkinson's disease, bedbound relative to cognitive impairment and hip surgery here for hospital follow-up.  On the day of admission she was noted gasping for air prompting her family to call EMS.  On her initial physical exam her blood pressure was 162/94, heart rate 57, respiratory rate 33 with oxygen saturations of 99%.  No rales or wheezing but decreased breath sounds in her left lower lobe.  Heart rate was regular positive JVD and her abdomen was soft and not distended.  No lower extremity swelling on exam.  Negative for COVID.  Potassium was low at 3.1.  Chest x-ray was obtained with hyperinflation and mild cardiomegaly with bilateral hilar vascular congestion with bilateral pleural effusions more on the left than right.  EKG was normal sinus rhythm 59 bpm with a left bundle branch block poor R wave progression and no significant ST segment or T wave changes.  Positive LVH.  Patient was placed on furosemide for diuresis and a right-sided thoracentesis was performed.  She also noted to have severe swallowing dysfunction.  She was then discharged 10/17 to a SNF and her CODE STATUS had been changed to DNR.  Palliative care was consulted.  Today, she ***  ROS: ***  Studies Reviewed: .       Echocardiogram during recent admission  Echocardiogram with reduced LV systolic function with EF 25 to 30%, global hypokinesis, severe LVH, (myocardium consistent with infiltrative cardiomyopathy) RV with mild reduction in systolic function, LA and RA with mild dilatation, mild to moderate MR  Risk Assessment/Calculations:   {Does this patient have ATRIAL FIBRILLATION?:[phone]}          Physical Exam:   VS:  There were no vitals taken for this visit.   Wt Readings from Last 3 Encounters:  08/30/23 84 lb 10.5 oz (38.4 kg)  08/09/23 87 lb 11.9 oz (39.8 kg)  07/02/22 128 lb (58.1 kg)    GEN: Well nourished, well developed in no acute distress NECK: No JVD; No carotid bruits CARDIAC: ***RRR, no murmurs, rubs, gallops RESPIRATORY:  Clear to auscultation without rales, wheezing or rhonchi  ABDOMEN: Soft, non-tender, non-distended EXTREMITIES:  No edema; No deformity   ASSESSMENT AND PLAN: .   Acute on chronic CHF Hypertension Parkinson's disease Swallow dysfunction Chronic kidney disease stage IIIa Type 2 diabetes mellitus Protein calorie malnutrition    {Are you ordering a CV Procedure (e.g. stress test, cath, DCCV, TEE, etc)?   Press F2        :[id number]}  Dispo: ***  Signed, Sharlene Dory, PA-C

## 2023-08-31 ENCOUNTER — Encounter (HOSPITAL_COMMUNITY): Payer: Self-pay | Admitting: Internal Medicine

## 2023-08-31 ENCOUNTER — Other Ambulatory Visit: Payer: Self-pay

## 2023-08-31 ENCOUNTER — Ambulatory Visit: Payer: Medicare PPO | Attending: Physician Assistant | Admitting: Physician Assistant

## 2023-08-31 DIAGNOSIS — R1310 Dysphagia, unspecified: Secondary | ICD-10-CM

## 2023-08-31 DIAGNOSIS — T17908S Unspecified foreign body in respiratory tract, part unspecified causing other injury, sequela: Secondary | ICD-10-CM | POA: Diagnosis not present

## 2023-08-31 DIAGNOSIS — Z7189 Other specified counseling: Secondary | ICD-10-CM | POA: Diagnosis not present

## 2023-08-31 DIAGNOSIS — Z515 Encounter for palliative care: Secondary | ICD-10-CM | POA: Diagnosis not present

## 2023-08-31 DIAGNOSIS — G20A1 Parkinson's disease without dyskinesia, without mention of fluctuations: Secondary | ICD-10-CM

## 2023-08-31 DIAGNOSIS — E43 Unspecified severe protein-calorie malnutrition: Secondary | ICD-10-CM

## 2023-08-31 DIAGNOSIS — N184 Chronic kidney disease, stage 4 (severe): Secondary | ICD-10-CM

## 2023-08-31 DIAGNOSIS — R627 Adult failure to thrive: Secondary | ICD-10-CM | POA: Diagnosis not present

## 2023-08-31 DIAGNOSIS — E785 Hyperlipidemia, unspecified: Secondary | ICD-10-CM

## 2023-08-31 DIAGNOSIS — I5023 Acute on chronic systolic (congestive) heart failure: Secondary | ICD-10-CM

## 2023-08-31 DIAGNOSIS — E119 Type 2 diabetes mellitus without complications: Secondary | ICD-10-CM

## 2023-08-31 DIAGNOSIS — I10 Essential (primary) hypertension: Secondary | ICD-10-CM

## 2023-08-31 MED ORDER — SODIUM CHLORIDE 0.9 % IV SOLN: INTRAVENOUS | Status: AC

## 2023-08-31 NOTE — Progress Notes (Signed)
[facility] PROGRESS NOTE   Alicia Vang VHQ:[id number] DOB: 1927/02/13 DOA: 08/22/2023  PCP: Eloisa Northern, MD  Brief History:  89 y.o. 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  Patient was recently admitted for acute CHF and was discharged to skilled nursing facility.  Oral intake had been poor at the skilled nursing facility.  There was also some concern for dysphagia.  Patient was hospitalized.  Consultants: Palliative care  Procedures: None   Subjective/Interval History: Patient asleep.  Daughter is at the bedside.  Apparently patient was awake for some part of the day yesterday.    Assessment/Plan:  Failure to thrive secondary to progressive cognitive impairment, dysphagia and dehydration: Lactic acidosis AKI: Patient's prognosis is thought to be poor considering her multiple comorbidities and dysphagia.  Palliative care is following on a daily basis.  Patient has been minimally responsive here in the hospital.  She has been transitioned to DNR status.  She is primarily under comfort care here in the hospital.  Family not able to decide on disposition as yet.  They are reluctant to consider hospice at this time.  Anticipate in-hospital death currently.   Cardiomyopathy recent diagnosis with CHF EF 25-30%: Concerns of infiltrative cardiomyopathy seen on last 2D echo done on 07/26/2023 History of sinus bradycardia PACs Wenckebach: Poor prognosis overall.  Based on previous evaluation by cardiology patient is not a candidate for aggressive care.     Aspiration pneumonia in the setting of dysphagia present on admission: Completed course of antibiotics.  Remains at high risk for aspiration.   Hypotension Resolved.     Type 2 diabetes mellitus with hyperglycemia:   DVT Prophylaxis: Comfort care Code Status: DNR Family Communication: Discussed with daughter who was at the bedside Disposition Plan: Anticipate in-hospital death     Medications: Scheduled:  Chlorhexidine Gluconate Cloth  6 each Topical Daily   mouth rinse  15 mL Mouth Rinse 4 times per day   Continuous:  sodium chloride 40 mL/hr at 08/31/23 0605   UXL:[id number], glycopyrrolate, HYDROmorphone (DILAUDID) injection, mouth rinse, polyethylene glycol, prochlorperazine  Antibiotics: Anti-infectives (From admission, onward)    Start     Dose/Rate Route Frequency Ordered Stop   08/23/23 1900  ceFEPIme (MAXIPIME) 1 g in sodium chloride 0.9 % 100 mL IVPB  Status:  Discontinued        1 g 200 mL/hr over 30 Minutes Intravenous Every 24 hours 08/22/23 1938 08/24/23 1204   08/22/23 1645  ceFEPIme (MAXIPIME) 2 g in sodium chloride 0.9 % 100 mL IVPB        2 g 200 mL/hr over 30 Minutes Intravenous  Once 08/22/23 1629 08/22/23 1847   08/22/23 1630  vancomycin (VANCOCIN) IVPB 1000 mg/200 mL premix  Status:  Discontinued        1,000 mg 200 mL/hr over 60 Minutes Intravenous  Once 08/22/23 1624 08/22/23 1625   08/22/23 1630  vancomycin (VANCOREADY) IVPB 750 mg/150 mL        750 mg 150 mL/hr over 60 Minutes Intravenous  Once 08/22/23 1626 08/22/23 2015       Objective:  Vital Signs  Vitals:   08/30/23 0456 08/30/23 0500 08/31/23 0500 08/31/23 0543  BP: (!) 116/58   (!) 141/57  Pulse: (!) 56   (!) 54  Resp: 15   17  Temp: (!) 97.5 F (36.4 C)   97.6 F (36.4 C)  TempSrc: Axillary   Oral  SpO2: 92%   97%  Weight:  38.4 kg 38.4 kg   Height:        Intake/Output Summary (Last 24 hours) at 08/31/2023 0938 Last data filed at 08/31/2023 1610 Gross per 24 hour  Intake 800.49 ml  Output 200 ml  Net 600.49 ml   Filed Weights   08/29/23 0500 08/30/23 0500 08/31/23 0500  Weight: 38.7 kg 38.4 kg 38.4 kg   Poorly responsive.   Does not appear to be in any discomfort or distress.  Lab Results:  Data Reviewed: I have personally reviewed following labs and reports of the imaging studies     Recent Results (from the past 240 hour(s))  MRSA Next Gen by PCR, Nasal     Status: None   Collection Time: 08/22/23  8:46 PM   Specimen: Nasal Mucosa; Nasal Swab  Result Value Ref Range Status   MRSA by PCR Next Gen NOT DETECTED NOT DETECTED Final    Comment: (NOTE) The GeneXpert MRSA Assay (FDA approved for NASAL specimens only), is one component of a comprehensive MRSA colonization surveillance program. It is not intended to diagnose MRSA infection nor to guide or monitor treatment for MRSA infections. Test performance is not FDA approved in patients less than 60 years old. Performed at [facility], [address], [place], [place] [address]   MRSA Next Gen by PCR, Nasal     Status: None   Collection Time: 08/23/23  4:09 PM   Specimen: Nasal Mucosa; Nasal Swab  Result Value Ref Range Status   MRSA by PCR Next Gen NOT DETECTED NOT DETECTED Final    Comment: (NOTE) The GeneXpert MRSA Assay (FDA approved for NASAL specimens only), is one component of a comprehensive MRSA colonization surveillance program. It is not intended to diagnose MRSA infection nor to guide or monitor treatment for MRSA infections. Test performance is not FDA approved in patients less than 56 years old. Performed at [facility], [address], [place], [place] [address]       Radiology Studies: No results found.     LOS: 9 days   Alicia Vang [employer] on [email or web address]  08/31/2023, 9:38 AM

## 2023-08-31 NOTE — Plan of Care (Signed)
  Problem: Coping: Goal: Ability to adjust to condition or change in health will improve Outcome: Progressing   Problem: Fluid Volume: Goal: Ability to maintain a balanced intake and output will improve Outcome: Progressing   Problem: Health Behavior/Discharge Planning: Goal: Ability to identify and utilize available resources and services will improve Outcome: Progressing Goal: Ability to manage health-related needs will improve Outcome: Progressing   Problem: Nutritional: Goal: Maintenance of adequate nutrition will improve Outcome: Progressing Goal: Progress toward achieving an optimal weight will improve Outcome: Progressing

## 2023-08-31 NOTE — Progress Notes (Signed)
Daily Progress Note   Patient Name: Alicia Vang       Date: 08/31/2023 DOB: 09-02-1927  Age: 89 y.o. MRN#: [id number] Attending Physician: Osvaldo Shipper, MD Primary Care Physician: Eloisa Northern, MD Admit Date: 08/22/2023 Length of Stay: 9 days  Reason for Consultation/Follow-up: Establishing goals of care  Subjective:   CC: Patient remains unresponsive in bed. Following up regarding complex medical decision making.   Subjective:  Reviewed EMR prior to presenting to bedside.  Discussed care with bedside RN for medical updates.  Presented to bedside to see patient.  Patient remains unresponsive sleeping in bed.  Patient's daughter, Alicia Vang, present at bedside.  Needed noted hospitalist had already been at bedside this morning along with their family friend who is a physician.  Alicia Vang continues to say "I hear what you are saying" about her mother's overall health condition.  She also describes her mother as a "strong "woman and will do things on her own time.  Spent lots of time providing emotional support via active listening while we needed a shared wonderful memories about her mother.  Alicia Vang also described patient has gone through periods where she does not eat and turn around and improved so that is why she is still holding on.  Answered all questions as able at that time.  Noted would continue to follow along with patient's medical journey.  Objective:   Vital Signs:  BP (!) 141/57 (BP Location: Right Arm)   Pulse (!) 54   Temp 97.6 F (36.4 C) (Oral)   Resp 17   Ht 5\' 6"  (1.676 m)   Wt 38.4 kg   SpO2 97%   BMI 13.66 kg/m   Physical Exam: General: Chronically ill-appearing, frail, cachectic, not responsive HENT: Dry mucous membranes Cardiovascular: RRR Respiratory: no increased work of breathing noted, not in respiratory distress Abdomen: not distended Extremities: Muscle wasting in all extremities  Imaging: I personally reviewed recent imaging.   Assessment &  Plan:   Assessment: Patient is a 89 year old female with a past medical history of HFrEF 25-30%, sinus bradycardia, hypertension, CKD, diabetes, and cognitive impairment who was admitted on 08/22/2023 from skilled nursing facility with decreased responsiveness.  During hospitalization patient has received management for aspiration pneumonia in the setting of known dysphagia.  Hospital course complicated by electrolyte abnormalities, severe hypovolemic hypernatremia deemed secondary to dehydration and poor oral intake noted to have lactic acidosis and AKI.  Palliative medicine team consulted to assist with complex medical decision making.  Recommendations/Plan: # Complex medical decision making/goals of care:  - Patient unable to participate in complex medical decision-making due to underlying medical status.  -Many providers have discussed with all 4 children that patient is at end of life. Family has decided NOT to pursue nasogastric/PEG tubes as would not change overall outcome and could instead cause harm by causing further aspiration in a rapid fashion on tube feeds or accumulation of fluid in lungs. IR consult has been appropriately canceled. Family wanting to continue with NaCl only IV fluids at this time. Have already stated "do not want hospice" so in hospital death anticipated.   -  Code Status: Limited: Do not attempt resuscitation (DNR) -DNR-LIMITED -Do Not Intubate/DNI   # Symptom management:  Pain Family mentioned great concern over use of morphine.   -Has  IV dilaudid 0.2mg  q3hrs prn for pain though family has been reluctant to provide medication for pain. Spent time describing signs of nonverbal pain to monitor for today.  # Psychosocial Support:  -  one daughter, 3 sons  # Discharge Planning: Anticipated Hospital Death based on current assessment and discussion with family  Discussed with: patient's daughter, RN  Thank you for allowing the palliative care team to participate in  the care Alicia Vang.  Alvester Morin, DO Palliative Care Provider PMT # [phone]  If patient remains symptomatic despite maximum doses, please call PMT at [phone] between 0700 and 1900. Outside of these hours, please call attending, as PMT does not have night coverage.  *Please note that this is a verbal dictation therefore any spelling or grammatical errors are due to the "Dragon Medical One" system interpretation.

## 2023-09-01 DIAGNOSIS — R627 Adult failure to thrive: Secondary | ICD-10-CM | POA: Diagnosis not present

## 2023-09-01 DIAGNOSIS — T17908S Unspecified foreign body in respiratory tract, part unspecified causing other injury, sequela: Secondary | ICD-10-CM | POA: Diagnosis not present

## 2023-09-01 DIAGNOSIS — Z515 Encounter for palliative care: Secondary | ICD-10-CM | POA: Diagnosis not present

## 2023-09-01 DIAGNOSIS — R4589 Other symptoms and signs involving emotional state: Secondary | ICD-10-CM | POA: Diagnosis not present

## 2023-09-01 MED ORDER — SODIUM CHLORIDE 0.9 % IV SOLN
INTRAVENOUS | Status: AC
Start: 2023-09-01 — End: 2023-09-02

## 2023-09-01 NOTE — Progress Notes (Signed)
Daily Progress Note   Patient Name: Alicia Vang       Date: 09/01/2023 DOB: 1927/05/01  Age: 89 y.o. MRN#: [id number] Attending Physician: Osvaldo Shipper, MD Primary Care Physician: Eloisa Northern, MD Admit Date: 08/22/2023 Length of Stay: 10 days  Reason for Consultation/Follow-up: Establishing goals of care  Subjective:   CC: Patient remains unresponsive in bed. Following up regarding complex medical decision making.   Subjective:  Reviewed EMR prior to presenting to bedside. Patient's blood pressure noted to be trending down today.   Presented to bedside to meet with family.  Patient's daughter, Alicia Vang, and cousin present at bedside.  Will need updated that overnight patient was noted to have low oxygen saturation because her fingers are so cold that staff placed oxygen support on.  She also noted patient's low blood pressure this morning. Alicia Vang also noted that she has decided not to attempt to feed the patient as she does not want to "hasten" her demise.  Alicia Vang denied any concerns at this time as she feels patient looks comfortable; she even brought in blanket to keep her warm. Spent time answering questions as needed and noted PMT would continue to follow along with patient's medical journey.   Objective:   Vital Signs:  BP (!) 98/57 (BP Location: Right Arm)   Pulse 68   Temp 97.9 F (36.6 C) (Oral)   Resp 16   Ht 5\' 6"  (1.676 m)   Wt 40 kg   SpO2 (!) 79%   BMI 14.23 kg/m   Physical Exam: General: Chronically ill-appearing, frail, cachectic, not responsive HENT: Dry mucous membranes Cardiovascular: RRR Respiratory: no increased work of breathing noted, not in respiratory distress Abdomen: not distended Extremities: Muscle wasting in all extremities  Imaging: I personally reviewed recent imaging.   Assessment & Plan:   Assessment: Patient is a 89 year old female with a past medical history of HFrEF 25-30%, sinus bradycardia, hypertension, CKD, diabetes, and  cognitive impairment who was admitted on 08/22/2023 from skilled nursing facility with decreased responsiveness.  During hospitalization patient has received management for aspiration pneumonia in the setting of known dysphagia.  Hospital course complicated by electrolyte abnormalities, severe hypovolemic hypernatremia deemed secondary to dehydration and poor oral intake noted to have lactic acidosis and AKI.  Palliative medicine team consulted to assist with complex medical decision making.  Recommendations/Plan: # Complex medical decision making/goals of care:  - Patient unable to participate in complex medical decision-making due to underlying medical status.  -Many providers have discussed with all 4 children that patient is at end of life. Family has decided NOT to pursue nasogastric/PEG tubes as would not change overall outcome and could instead cause harm by causing further aspiration in a rapid fashion on tube feeds or accumulation of fluid in lungs. Family has been wanting to continue with NaCl only IV fluids at this time. Have already stated "do not want hospice" so in hospital death anticipated.   -  Code Status: Limited: Do not attempt resuscitation (DNR) -DNR-LIMITED -Do Not Intubate/DNI   # Symptom management:  Pain Family mentioned great concern over use of morphine.   -Has  IV dilaudid 0.2mg  q3hrs prn for pain though family has been reluctant to provide medication for pain. Spent time describing signs of nonverbal pain to monitor for today.  # Psychosocial Support:  -one daughter, 3 sons  # Discharge Planning: Anticipated Hospital Death based on current assessment and discussion with family  Discussed with: patient's daughter, RN  Thank you for allowing  the palliative care team to participate in the care Alicia Vang.  Alicia Morin, DO Palliative Care Provider PMT # [phone]  If patient remains symptomatic despite maximum doses, please call PMT at [phone]  between 0700 and 1900. Outside of these hours, please call attending, as PMT does not have night coverage.  *Please note that this is a verbal dictation therefore any spelling or grammatical errors are due to the "Dragon Medical One" system interpretation.

## 2023-09-01 NOTE — Progress Notes (Signed)
   [facility] PROGRESS NOTE   Alicia Vang YQM:[id number] DOB: 01-Sep-1927 DOA: 08/22/2023  PCP: Eloisa Northern, MD  Brief History:  89 y.o. 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  Patient was recently admitted for acute CHF and was discharged to skilled nursing facility.  Oral intake had been poor at the skilled nursing facility.  There was also some concern for dysphagia.  Patient was hospitalized.  Consultants: Palliative care  Procedures: None   Subjective/Interval History: Patient remains unresponsive for the most part.  Daughter is at the bedside.  Assessment/Plan:  Failure to thrive secondary to progressive cognitive impairment, dysphagia and dehydration: Lactic acidosis AKI: Patient's prognosis is thought to be poor considering her multiple comorbidities and dysphagia.  Palliative care is following on a daily basis.  Patient has been minimally responsive here in the hospital.  She has been transitioned to DNR status.   She is primarily under comfort care here in the hospital.   Family reluctant to pursue hospice.  Anticipate in-hospital death.     Cardiomyopathy recent diagnosis with CHF EF 25-30%: Concerns of infiltrative cardiomyopathy seen on last 2D echo done on 07/26/2023 History of sinus bradycardia PACs Wenckebach: Poor prognosis overall.  Based on previous evaluation by cardiology patient is not a candidate for aggressive care.     Aspiration pneumonia in the setting of dysphagia present on admission: Completed course of antibiotics.  Remains at high risk for aspiration.   Hypotension Resolved.    Type 2 diabetes mellitus with hyperglycemia:   DVT Prophylaxis: Comfort care Code Status:  DNR Family Communication: Discussed with daughter who was at the bedside Disposition Plan: Anticipate in-hospital death     Medications: Scheduled:  Chlorhexidine Gluconate Cloth  6 each Topical Daily   mouth rinse  15 mL Mouth Rinse 4 times per day   Continuous:  sodium chloride 40 mL/hr at 09/01/23 0759   BMW:[id number], glycopyrrolate, HYDROmorphone (DILAUDID) injection, mouth rinse, polyethylene glycol, prochlorperazine   Objective:  Vital Signs  Vitals:   08/31/23 0500 08/31/23 0543 09/01/23 0431 09/01/23 0500  BP:  (!) 141/57 (!) 98/57   Pulse:  (!) 54 68   Resp:  17 16   Temp:  97.6 F (36.4 C) 97.9 F (36.6 C)   TempSrc:  Oral Oral   SpO2:  97% (!) 79%   Weight: 38.4 kg   40 kg  Height:         Poorly responsive.  Low blood pressures and hypoxia noted.  Noted to be on oxygen.      LOS: 10 days   Antwion Carpenter [employer] on [email or web address]  09/01/2023, 9:46 AM

## 2023-09-02 DIAGNOSIS — Z515 Encounter for palliative care: Secondary | ICD-10-CM | POA: Diagnosis not present

## 2023-09-02 DIAGNOSIS — R627 Adult failure to thrive: Secondary | ICD-10-CM | POA: Diagnosis not present

## 2023-09-02 DIAGNOSIS — T17908S Unspecified foreign body in respiratory tract, part unspecified causing other injury, sequela: Secondary | ICD-10-CM | POA: Diagnosis not present

## 2023-09-02 DIAGNOSIS — R4589 Other symptoms and signs involving emotional state: Secondary | ICD-10-CM | POA: Diagnosis not present

## 2023-09-02 NOTE — Progress Notes (Signed)
Daily Progress Note   Patient Name: Alicia Vang       Date: 09/02/2023 DOB: [birth year]  Age: 89 y.o. MRN#: [id number] Attending Physician: Osvaldo Shipper, MD Primary Care Physician: Eloisa Northern, MD Admit Date: 08/22/2023 Length of Stay: 11 days  Reason for Consultation/Follow-up: Establishing goals of care  Subjective:   CC: Patient remains unresponsive in bed. Following up regarding complex medical decision making.   Subjective:  Reviewed EMR prior to presenting to bedside. Spoke with patient's youngest son at bedside. Noted he was there to allow patient's daughter, Alicia Vang, time to go home and rest. Spent time answering questions as able and proving emotional support via active listening. Son noted family having great difficulty with processing loss of their mother. Acknowledged this and empathized with difficult situation. Noted PMT would continue to follow along with patient's medical journey.   Objective:   Vital Signs:  BP (!) 126/58 (BP Location: Right Arm)   Pulse 66   Temp 97.7 F (36.5 C) (Oral)   Resp 12   Ht 5\' 6"  (1.676 m)   Wt 40 kg   SpO2 99%   BMI 14.23 kg/m   Physical Exam: General: Chronically ill-appearing, frail, cachectic, not responsive HENT: Dry mucous membranes Cardiovascular: RRR Respiratory: no increased work of breathing noted, not in respiratory distress Abdomen: not distended Extremities: Muscle wasting in all extremities  Imaging: I personally reviewed recent imaging.   Assessment & Plan:   Assessment: Patient is a 89 year old female with a past medical history of HFrEF 25-30%, sinus bradycardia, hypertension, CKD, diabetes, and cognitive impairment who was admitted on 08/22/2023 from skilled nursing facility with decreased responsiveness.  During hospitalization patient has received management for aspiration pneumonia in the setting of known dysphagia.  Hospital course complicated by electrolyte abnormalities, severe hypovolemic  hypernatremia deemed secondary to dehydration and poor oral intake noted to have lactic acidosis and AKI.  Palliative medicine team consulted to assist with complex medical decision making.  Recommendations/Plan: # Complex medical decision making/goals of care:  - Patient unable to participate in complex medical decision-making due to underlying medical status.  -Many providers have discussed with all 4 children that patient is at end of life. Family has decided NOT to pursue nasogastric/PEG tubes as would not change overall outcome and could instead cause harm by causing further aspiration in a rapid fashion on tube feeds or accumulation of fluid in lungs. Family has been wanting to continue with NaCl only IV fluids at this time. Have already stated "do not want hospice" so in hospital death anticipated.   -  Code Status: Limited: Do not attempt resuscitation (DNR) -DNR-LIMITED -Do Not Intubate/DNI   # Symptom management:  Pain Family mentioned great concern over use of morphine.   -Has  IV dilaudid 0.2mg  q3hrs prn for pain though family has been reluctant to provide medication for pain. Spent time describing signs of nonverbal pain to monitor for today.  # Psychosocial Support:  -one daughter, 3 sons  # Discharge Planning: Anticipated Hospital Death based on current assessment and discussion with family  Discussed with: patient's son  Thank you for allowing the palliative care team to participate in the care Alicia Vang.  Alvester Morin, DO Palliative Care Provider PMT # [phone]  If patient remains symptomatic despite maximum doses, please call PMT at [phone] between 0700 and 1900. Outside of these hours, please call attending, as PMT does not have night coverage.  *Please note that this is a verbal dictation therefore any  spelling or grammatical errors are due to the "Dragon Medical One" system interpretation.

## 2023-09-02 NOTE — Progress Notes (Addendum)
   [facility] PROGRESS NOTE   Alicia Vang HQI:[id number] DOB: Oct 15, 1927 DOA: 08/22/2023  PCP: Eloisa Northern, MD  Brief History:  89 y.o. 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  Patient was recently admitted for acute CHF and was discharged to skilled nursing facility.  Oral intake had been poor at the skilled nursing facility.  There was also some concern for dysphagia.  Patient was hospitalized.  Consultants: Palliative care  Procedures: None   Subjective/Interval History: Patient remains unresponsive.  No family at bedside today.    Assessment/Plan:  Failure to thrive secondary to progressive cognitive impairment, dysphagia and dehydration: Lactic acidosis AKI: Patient's prognosis is thought to be poor considering her multiple comorbidities and dysphagia.  Palliative care is following on a daily basis.  Patient has been minimally responsive here in the hospital.  She has been transitioned to DNR status.   She is primarily under comfort care here in the hospital.   Family reluctant to pursue hospice.  Anticipate in-hospital death.     Cardiomyopathy recent diagnosis with CHF EF 25-30%: Concerns of infiltrative cardiomyopathy seen on last 2D echo done on 07/26/2023 History of sinus bradycardia PACs Wenckebach: Poor prognosis overall.  Based on previous evaluation by cardiology patient is not a candidate for aggressive care.     Aspiration pneumonia in the setting of dysphagia present on admission: Completed course of antibiotics.  Remains at high risk for aspiration.   Hypotension Resolved.    Type 2 diabetes mellitus with hyperglycemia:   DVT Prophylaxis: Comfort care Code Status: DNR Family  Communication: Discussed with daughter who was at the bedside Disposition Plan: Anticipate in-hospital death     Medications: Scheduled:  Chlorhexidine Gluconate Cloth  6 each Topical Daily   mouth rinse  15 mL Mouth Rinse 4 times per day   Continuous:  sodium chloride 40 mL/hr at 09/02/23 0151   XLK:[id number], glycopyrrolate, HYDROmorphone (DILAUDID) injection, mouth rinse, polyethylene glycol, prochlorperazine   Objective:  Vital Signs  Vitals:   08/31/23 0543 09/01/23 0431 09/01/23 0500 09/02/23 0549  BP: (!) 141/57 (!) 98/57  (!) 126/58  Pulse: (!) 54 68  66  Resp: 17 16  12   Temp: 97.6 F (36.4 C) 97.9 F (36.6 C)  97.7 F (36.5 C)  TempSrc: Oral Oral  Oral  SpO2: 97% (!) 79%  99%  Weight:   40 kg   Height:         Poorly responsive.   This is a no charge note.   LOS: 11 days   Latessa Tillis [employer] on [email or web address]  09/02/2023, 9:48 AM

## 2023-09-02 NOTE — Plan of Care (Signed)
  Problem: Skin Integrity: Goal: Risk for impaired skin integrity will decrease Outcome: Progressing   Problem: Clinical Measurements: Goal: Will remain free from infection Outcome: Progressing Goal: Diagnostic test results will improve Outcome: Progressing Goal: Respiratory complications will improve Outcome: Progressing   Problem: Pain Management: Goal: General experience of comfort will improve Outcome: Progressing   Problem: Safety: Goal: Ability to remain free from injury will improve Outcome: Progressing

## 2023-09-03 DIAGNOSIS — T17908S Unspecified foreign body in respiratory tract, part unspecified causing other injury, sequela: Secondary | ICD-10-CM | POA: Diagnosis not present

## 2023-09-03 DIAGNOSIS — R4589 Other symptoms and signs involving emotional state: Secondary | ICD-10-CM | POA: Diagnosis not present

## 2023-09-03 DIAGNOSIS — R627 Adult failure to thrive: Secondary | ICD-10-CM | POA: Diagnosis not present

## 2023-09-03 DIAGNOSIS — Z515 Encounter for palliative care: Secondary | ICD-10-CM | POA: Diagnosis not present

## 2023-09-03 MED ORDER — SODIUM CHLORIDE 0.9 % IV SOLN: INTRAVENOUS | Status: DC

## 2023-09-03 NOTE — Plan of Care (Signed)
  Problem: Skin Integrity: Goal: Risk for impaired skin integrity will decrease Outcome: Progressing   Problem: Clinical Measurements: Goal: Will remain free from infection Outcome: Progressing Goal: Respiratory complications will improve Outcome: Progressing   Problem: Elimination: Goal: Will not experience complications related to urinary retention Outcome: Progressing   Problem: Pain Management: Goal: General experience of comfort will improve Outcome: Progressing   Problem: Safety: Goal: Ability to remain free from injury will improve Outcome: Progressing   Problem: Skin Integrity: Goal: Risk for impaired skin integrity will decrease Outcome: Progressing

## 2023-09-03 NOTE — TOC Progression Note (Signed)
Transition of Care [facility]) - Progression Note    Patient Details  Name: ANNSLEE MCFEE MRN: [id number] Date of Birth: July 28, 1927  Transition of [facility]) CM/SW Contact  Darleene Cleaver, [place] Phone Number: 09/03/2023, 11:04 AM  Clinical Narrative:     Patient on comfort care, TOC following in case discharge plan changes.   Expected Discharge Plan: Hospice Medical Facility Barriers to Discharge: Continued Medical Work up  Expected Discharge Plan and Services In-house Referral: Hospice / Palliative Care, Chaplain Discharge Planning Services: CM Consult Post Acute Care Choice: Hospice Living arrangements for the past 2 months: Skilled Nursing Facility                 DME Arranged: N/A DME Agency: NA       HH Arranged: NA HH Agency: NA         Social Determinants of Health (SDOH) Interventions SDOH Screenings   Food Insecurity: No Food Insecurity (08/23/2023)  Housing: Low Risk  (08/23/2023)  Transportation Needs: No Transportation Needs (08/23/2023)  Utilities: Not At Risk (08/23/2023)  Tobacco Use: Low Risk  (08/31/2023)    Readmission Risk Interventions    08/24/2023    1:31 PM  Readmission Risk Prevention Plan  Transportation Screening Complete  PCP or Specialist Appt within 3-5 Days Complete  HRI or Home Care Consult Complete  Social Work Consult for Recovery Care Planning/Counseling Complete  Palliative Care Screening Complete  Medication Review Oceanographer) Complete

## 2023-09-03 NOTE — Progress Notes (Signed)
   [facility] PROGRESS NOTE   Alicia Vang OZD:[id number] DOB: 05-Nov-1926 DOA: 08/22/2023  PCP: Eloisa Northern, MD  Brief History:  89 y.o. 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  Patient was recently admitted for acute CHF and was discharged to skilled nursing facility.  Oral intake had been poor at the skilled nursing facility.  There was also some concern for dysphagia.  Patient was hospitalized.  Consultants: Palliative care  Procedures: None   Subjective/Interval History: Patient remains unresponsive.  Daughter is at the bedside.  Assessment/Plan:  Failure to thrive secondary to progressive cognitive impairment, dysphagia and dehydration: Lactic acidosis AKI: Patient's prognosis is thought to be poor considering her multiple comorbidities and dysphagia.  Palliative care is following on a daily basis.  Patient has been minimally responsive here in the hospital.  She has been transitioned to DNR status.   She is primarily under comfort care here in the hospital.   Family reluctant to pursue hospice.  Anticipate in-hospital death.     Cardiomyopathy recent diagnosis with CHF EF 25-30%: Concerns of infiltrative cardiomyopathy seen on last 2D echo done on 07/26/2023 History of sinus bradycardia PACs Wenckebach: Poor prognosis overall.  Based on previous evaluation by cardiology patient is not a candidate for aggressive care.     Aspiration pneumonia in the setting of dysphagia present on admission: Completed course of antibiotics.  Remains at high risk for aspiration.   Hypotension Resolved.    Type 2 diabetes mellitus with hyperglycemia:   DVT Prophylaxis: Comfort care Code Status: DNR Family Communication:  Discussed with daughter who was at the bedside Disposition Plan: Anticipate in-hospital death     Medications: Scheduled:  Chlorhexidine Gluconate Cloth  6 each Topical Daily   mouth rinse  15 mL Mouth Rinse 4 times per day   Continuous:  sodium chloride     QVZ:[id number], glycopyrrolate, HYDROmorphone (DILAUDID) injection, mouth rinse, polyethylene glycol, prochlorperazine   Objective:  Vital Signs  Vitals:   09/01/23 0431 09/01/23 0500 09/02/23 0549 09/03/23 0630  BP: (!) 98/57  (!) 126/58 119/62  Pulse: 68  66 69  Resp: 16  12 16   Temp: 97.9 F (36.6 C)  97.7 F (36.5 C) 98.4 F (36.9 C)  TempSrc: Oral  Oral Oral  SpO2: (!) 79%  99% (!) 82%  Weight:  40 kg    Height:         Poorly responsive.   This is a no charge note.   LOS: 12 days   Gaberiel Youngblood [employer] on [email or web address]  09/03/2023, 9:25 AM

## 2023-09-03 NOTE — Progress Notes (Signed)
Daily Progress Note   Patient Name: Alicia Vang       Date: 09/03/2023 DOB: 11-Aug-1927  Age: 89 y.o. MRN#: [id number] Attending Physician: Osvaldo Shipper, MD Primary Care Physician: Eloisa Northern, MD Admit Date: 08/22/2023 Length of Stay: 12 days  Reason for Consultation/Follow-up: Establishing goals of care  Subjective:   CC: Patient remains unresponsive in bed. Following up regarding complex medical decision making.   Subjective:  Reviewed EMR prior to presenting to bedside.   Patient remains unresponsive laying in bed. Patient's daughter, Alicia Vang, present at bedside. Spent time answering questions as able and providing emotional support via active listening. Discussed continuing only currently level of interventions with NaCl fluids (without glucose) and why labs would not change overall medical course at this time. Tech at bedside did update patient's overall decreased urine output. Attempted to discuss the signs to look for with death as Alicia Vang would allow with permission. Family has not been accepting of hospice so anticipate hospital death.   Objective:   Vital Signs:  BP 119/62 (BP Location: Right Arm)   Pulse 69   Temp 98.4 F (36.9 C) (Oral)   Resp 16   Ht 5\' 6"  (1.676 m)   Wt 40 kg   SpO2 (!) 82%   BMI 14.23 kg/m   Physical Exam: General: Chronically ill-appearing, frail, cachectic, not responsive HENT: Dry mucous membranes Cardiovascular: RRR Respiratory: no increased work of breathing noted, not in respiratory distress Abdomen: not distended Extremities: Muscle wasting in all extremities  Imaging: I personally reviewed recent imaging.   Assessment & Plan:   Assessment: Patient is a 89 year old female with a past medical history of HFrEF 25-30%, sinus bradycardia, hypertension, CKD, diabetes, and cognitive impairment who was admitted on 08/22/2023 from skilled nursing facility with decreased responsiveness.  During hospitalization patient has  received management for aspiration pneumonia in the setting of known dysphagia.  Hospital course complicated by electrolyte abnormalities, severe hypovolemic hypernatremia deemed secondary to dehydration and poor oral intake noted to have lactic acidosis and AKI.  Palliative medicine team consulted to assist with complex medical decision making.  Recommendations/Plan: # Complex medical decision making/goals of care:  - Patient unable to participate in complex medical decision-making due to underlying medical status.  -Many providers have discussed with all 4 children that patient is at end of life. Family has decided NOT to pursue nasogastric/PEG tubes as would not change overall outcome and could instead cause harm by causing further aspiration in a rapid fashion on tube feeds or accumulation of fluid in lungs. Family has been wanting to continue with NaCl only IV fluids at this time. Continue to discuss with family use of only these NaCl fluids (not glucose) and why lab work would not change overall outcome. Have discussed many times about nutrition and "eating for comfort" which family does not want to give her food by mouth due to fear of "hastening" things. Family has  already stated "do not want hospice" so in hospital death anticipated.   -  Code Status: Limited: Do not attempt resuscitation (DNR) -DNR-LIMITED -Do Not Intubate/DNI   # Symptom management:  Pain Family mentioned great concern over use of morphine.   -Has  IV dilaudid 0.2mg  q3hrs prn for pain though family has been reluctant to provide medication for pain. Spent time describing signs of nonverbal pain to monitor for today.  # Psychosocial Support:  -one daughter, 3 sons  # Discharge Planning: Anticipated Hospital Death based on current assessment and discussion with family  Discussed with: patient's daughter, RN  Thank you for allowing the palliative care team to participate in the care Alicia Vang.  Alvester Morin,  DO Palliative Care Provider PMT # [phone]  If patient remains symptomatic despite maximum doses, please call PMT at [phone] between 0700 and 1900. Outside of these hours, please call attending, as PMT does not have night coverage.  Personally spent 42 minutes in patient care including extensive chart review (labs, imaging, progress/consult notes, vital signs), medically appropraite exam, discussed with treatment team, education to patient, family, and staff, documenting clinical information, medication review and management, coordination of care, and available advanced directive documents.   *Please note that this is a verbal dictation therefore any spelling or grammatical errors are due to the "Dragon Medical One" system interpretation.

## 2023-09-04 DIAGNOSIS — R627 Adult failure to thrive: Secondary | ICD-10-CM | POA: Diagnosis not present

## 2023-09-04 MED ORDER — SODIUM CHLORIDE 0.9 % IV SOLN: INTRAVENOUS | Status: AC

## 2023-09-04 NOTE — Plan of Care (Signed)
  Problem: Education: Goal: Ability to describe self-care measures that may prevent or decrease complications (Diabetes Survival Skills Education) will improve Outcome: Progressing Goal: Individualized Educational Video(s) Outcome: Progressing   Problem: Coping: Goal: Ability to adjust to condition or change in health will improve Outcome: Progressing   Problem: Fluid Volume: Goal: Ability to maintain a balanced intake and output will improve Outcome: Progressing   Problem: Health Behavior/Discharge Planning: Goal: Ability to identify and utilize available resources and services will improve Outcome: Progressing Goal: Ability to manage health-related needs will improve Outcome: Progressing   Problem: Nutritional: Goal: Maintenance of adequate nutrition will improve Outcome: Progressing Goal: Progress toward achieving an optimal weight will improve Outcome: Progressing   Problem: Skin Integrity: Goal: Risk for impaired skin integrity will decrease Outcome: Progressing   Problem: Tissue Perfusion: Goal: Adequacy of tissue perfusion will improve Outcome: Progressing   Problem: Education: Goal: Knowledge of General Education information will improve Description: Including pain rating scale, medication(s)/side effects and non-pharmacologic comfort measures Outcome: Progressing   Problem: Health Behavior/Discharge Planning: Goal: Ability to manage health-related needs will improve Outcome: Progressing   Problem: Clinical Measurements: Goal: Ability to maintain clinical measurements within normal limits will improve Outcome: Progressing Goal: Will remain free from infection Outcome: Progressing Goal: Diagnostic test results will improve Outcome: Progressing Goal: Respiratory complications will improve Outcome: Progressing Goal: Cardiovascular complication will be avoided Outcome: Progressing   Problem: Activity: Goal: Risk for activity intolerance will  decrease Outcome: Progressing   Problem: Nutrition: Goal: Adequate nutrition will be maintained Outcome: Progressing   Problem: Elimination: Goal: Will not experience complications related to bowel motility Outcome: Progressing Goal: Will not experience complications related to urinary retention Outcome: Progressing   Problem: Pain Management: Goal: General experience of comfort will improve Outcome: Progressing   Problem: Safety: Goal: Ability to remain free from injury will improve Outcome: Progressing   Problem: Skin Integrity: Goal: Risk for impaired skin integrity will decrease Outcome: Progressing

## 2023-09-04 NOTE — Progress Notes (Signed)
   [facility] PROGRESS NOTE   Alicia Vang UXL:[id number] DOB: 02-03-1927 DOA: 08/22/2023  PCP: Eloisa Northern, MD  Brief History:  89 y.o. 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  Patient was recently admitted for acute CHF and was discharged to skilled nursing facility.  Oral intake had been poor at the skilled nursing facility.  There was also some concern for dysphagia.  Patient was hospitalized.  Consultants: Palliative care  Procedures: None   Subjective/Interval History: Patient remains unresponsive.  Daughter is at the bedside.    Assessment/Plan:  Failure to thrive secondary to progressive cognitive impairment, dysphagia and dehydration: Lactic acidosis AKI: Patient's prognosis is thought to be poor considering her multiple comorbidities and dysphagia.  Palliative care is following on a daily basis.  Patient has been minimally responsive here in the hospital.  She has been transitioned to DNR status.   She is primarily under comfort care here in the hospital.   Family reluctant to pursue hospice.  Anticipate in-hospital death.     Cardiomyopathy recent diagnosis with CHF EF 25-30%: Concerns of infiltrative cardiomyopathy seen on last 2D echo done on 07/26/2023 History of sinus bradycardia PACs Wenckebach: Poor prognosis overall.  Based on previous evaluation by cardiology patient is not a candidate for aggressive care.     Aspiration pneumonia in the setting of dysphagia present on admission: Completed course of antibiotics.  Remains at high risk for aspiration.   Hypotension Resolved.    Type 2 diabetes mellitus with hyperglycemia:   DVT Prophylaxis: Comfort care Code Status: DNR Family  Communication: Discussed with daughter who was at the bedside Disposition Plan: Anticipate in-hospital death     Medications: Scheduled:  Chlorhexidine Gluconate Cloth  6 each Topical Daily   mouth rinse  15 mL Mouth Rinse 4 times per day   Continuous:  sodium chloride     ZDG:[id number], glycopyrrolate, HYDROmorphone (DILAUDID) injection, mouth rinse, polyethylene glycol, prochlorperazine   Objective:  Vital Signs  Vitals:   09/01/23 0500 09/02/23 0549 09/03/23 0630 09/04/23 0726  BP:  (!) 126/58 119/62 (!) 95/52  Pulse:  66 69 70  Resp:  12 16 (!) 22  Temp:  97.7 F (36.5 C) 98.4 F (36.9 C) 98.7 F (37.1 C)  TempSrc:  Oral Oral Oral  SpO2:  99% (!) 82% 92%  Weight: 40 kg     Height:         Poorly responsive.   This is a no charge note.   LOS: 13 days   Ramil Edgington [employer] on [email or web address]  09/04/2023, 9:08 AM

## 2023-09-04 NOTE — Progress Notes (Signed)
  Daily Progress Note   Patient Name: Alicia Vang       Date: 09/04/2023 DOB: Jul 05, 1927  Age: 89 y.o. MRN#: [id number] Attending Physician: Osvaldo Shipper, MD Primary Care Physician: Eloisa Northern, MD Admit Date: 08/22/2023 Length of Stay: 13 days  Reason for Consultation/Follow-up: Establishing goals of care  Subjective:   CC: Patient remains unresponsive in bed. Following up regarding complex medical decision making.   Subjective:  Reviewed EMR prior to presenting to bedside. Spoke with patient's daughter Alicia Vang  Spent time answering questions as able and proving emotional support via active listening.  Encouraged self care for daughter.   Acknowledged this and empathized with difficult situation. Noted PMT would continue to follow along with patient's medical journey.   Objective:   Vital Signs:  BP (!) 95/52 (BP Location: Right Arm)   Pulse 70   Temp 98.7 F (37.1 C) (Oral)   Resp (!) 22   Ht 5\' 6"  (1.676 m)   Wt 40 kg   SpO2 92%   BMI 14.23 kg/m   Physical Exam: General: Chronically ill-appearing, frail, cachectic, not responsive HENT: Dry mucous membranes Cardiovascular: RRR Respiratory: no increased work of breathing noted, not in respiratory distress Abdomen: not distended Extremities: Muscle wasting in all extremities  Imaging: I personally reviewed recent imaging.   Assessment & Plan:   Assessment: Patient is a 89 year old female with a past medical history of HFrEF 25-30%, sinus bradycardia, hypertension, CKD, diabetes, and cognitive impairment who was admitted on 08/22/2023 from skilled nursing facility with decreased responsiveness.  During hospitalization patient has received management for aspiration pneumonia in the setting of known dysphagia.  Hospital course complicated by electrolyte abnormalities, severe hypovolemic hypernatremia deemed secondary to dehydration and poor oral intake noted to have lactic acidosis and AKI.  Palliative medicine  team consulted to assist with complex medical decision making.  Recommendations/Plan: # Complex medical decision making/goals of care:  - Patient unable to participate in complex medical decision-making due to underlying medical status.  -Many providers have discussed with all 4 children that patient is at end of life. Family has decided NOT to pursue nasogastric/PEG tubes as would not change overall outcome and could instead cause harm by causing further aspiration in a rapid fashion on tube feeds or accumulation of fluid in lungs. Family has been wanting to continue with NaCl only IV fluids at this time. Have already stated "do not want hospice" so in hospital death anticipated.   -  Code Status: Limited: Do not attempt resuscitation (DNR) -DNR-LIMITED -Do Not Intubate/DNI   # Symptom management:  Pain Family mentioned great concern over use of morphine.   -Has  IV dilaudid 0.2mg  q3hrs prn for pain    # Psychosocial Support:  -one daughter, 3 sons  # Discharge Planning: Anticipated Hospital Death based on current assessment and discussion with family  Discussed with: patient's daughter.   Thank you for allowing the palliative care team to participate in the care Alicia Vang.  Low mdm Rosalin Hawking MD Palliative Care Provider PMT # [phone]  If patient remains symptomatic despite maximum doses, please call PMT at [phone] between 0700 and 1900. Outside of these hours, please call attending, as PMT does not have night coverage.  *Please note that this is a verbal dictation therefore any spelling or grammatical errors are due to the "Dragon Medical One" system interpretation.

## 2023-09-05 DIAGNOSIS — T17908S Unspecified foreign body in respiratory tract, part unspecified causing other injury, sequela: Secondary | ICD-10-CM | POA: Diagnosis not present

## 2023-09-05 DIAGNOSIS — Z515 Encounter for palliative care: Secondary | ICD-10-CM

## 2023-09-05 DIAGNOSIS — R627 Adult failure to thrive: Secondary | ICD-10-CM | POA: Diagnosis not present

## 2023-09-05 DIAGNOSIS — N179 Acute kidney failure, unspecified: Secondary | ICD-10-CM | POA: Diagnosis not present

## 2023-09-05 MED ORDER — MORPHINE SULFATE (CONCENTRATE) 10 MG/0.5ML PO SOLN
5.0000 mg | ORAL | Status: DC | PRN
  Administered 2023-09-05: 5 mg via SUBLINGUAL
  Filled 2023-09-05 (×2): qty 0.5

## 2023-09-05 MED ORDER — SODIUM CHLORIDE 0.9 % IV SOLN
INTRAVENOUS | Status: AC
Start: 2023-09-05 — End: 2023-09-06

## 2023-09-05 NOTE — Progress Notes (Signed)
Daily Progress Note   Patient Name: Alicia Vang       Date: 09/05/2023 DOB: 1927/06/21  Age: 89 y.o. MRN#: [id number] Attending Physician: Almon Hercules, MD Primary Care Physician: Eloisa Northern, MD Admit Date: 08/22/2023 Length of Stay: 14 days  Reason for Consultation/Follow-up: Establishing goals of care  Subjective:   CC: Patient remains unresponsive in bed. Following up regarding complex medical decision making.   Subjective:  Reviewed EMR prior to presenting to bedside.    Objective:   Vital Signs:  BP (!) 99/52 (BP Location: Right Arm)   Pulse 72   Temp 99.9 F (37.7 C) (Oral)   Resp 16   Ht 5\' 6"  (1.676 m)   Wt 40 kg   SpO2 91%   BMI 14.23 kg/m   Physical Exam: General: Chronically ill-appearing, frail, cachectic, not responsive HENT: Dry mucous membranes Cardiovascular: RRR Respiratory: no increased work of breathing noted, not in respiratory distress Abdomen: not distended Extremities: Muscle wasting in all extremities  Imaging: I personally reviewed recent imaging.   Assessment & Plan:   Assessment: Patient is a 89 year old female with a past medical history of HFrEF 25-30%, sinus bradycardia, hypertension, CKD, diabetes, and cognitive impairment who was admitted on 08/22/2023 from skilled nursing facility with decreased responsiveness.  During hospitalization patient has received management for aspiration pneumonia in the setting of known dysphagia.  Hospital course complicated by electrolyte abnormalities, severe hypovolemic hypernatremia deemed secondary to dehydration and poor oral intake noted to have lactic acidosis and AKI.  Palliative medicine team consulted to assist with complex medical decision making.  Recommendations/Plan: # Complex medical decision making/goals of care:  - Patient unable to participate in complex medical decision-making due to underlying medical status.  -Many providers have discussed with all 4 children that patient  is at end of life. Family has decided NOT to pursue nasogastric/PEG tubes as would not change overall outcome and could instead cause harm by causing further aspiration in a rapid fashion on tube feeds or accumulation of fluid in lungs. Family has been wanting to continue with NaCl only IV fluids at this time. Have already stated "do not want hospice" so in hospital death anticipated.   -  Code Status: Limited: Do not attempt resuscitation (DNR) -DNR-LIMITED -Do Not Intubate/DNI   # Symptom management:  Pain Family mentioned great concern over use of morphine.   -Has  IV dilaudid 0.2mg  q3hrs prn for pain   Less Than 2 Days Before Death In the final hours, patients exhibit specific clinical signs that indicate the approach of death.  Clinical Signs:  1.Death rattle  Management Techniques: Educate the family about the nature of the sound Reposition the patient to promote drainage Consider using anticholinergic medications if the patient is suffering.  2.Apnea. Management Techniques:  Educate the family about the possibility of irregular breathing patterns Administer opioids if dyspnea is present.  3.Respiration with mandibular movement  Management Techniques: Educate the family about changes in breathing patterns.  4.Decreased urine output  Management Techniques: Educate the family about the natural decrease in bodily functions.  5.Pulselessness of radial artery  Management Techniques: Educate the family about changes in circulation and perfusion.  6.Inability to close eyelids  Management Techniques: Educate the family about the physical changes in muscle control Use a wet washcloth if eyes are dry or irritated.  7.Grunting of vocal cords  Management Techniques: Educate the family the family about the possibility of vocal cord tension Administer opioids if pain is present.  8.Fever  Management Techniques: Place a cool washcloth on the patient's forehead Remove excess  blankets Use a fan Administer acetominophin if necessary  # Psychosocial Support:  -one daughter, 3 sons  # Discharge Planning: Anticipated Hospital Death based on current assessment and discussion with family  Discussed with:IDT  Thank you for allowing the palliative care team to participate in the care Kenslee S Koopmann.  Low mdm Rosalin Hawking MD Palliative Care Provider PMT # [phone]  If patient remains symptomatic despite maximum doses, please call PMT at [phone] between 0700 and 1900. Outside of these hours, please call attending, as PMT does not have night coverage.  *Please note that this is a verbal dictation therefore any spelling or grammatical errors are due to the "[employer]" system interpretation.

## 2023-09-05 NOTE — Progress Notes (Addendum)
Morphine concentrate 0.24mL = 5mg  of 10mg /0.57mL wasted with Efraim Kaufmann RN

## 2023-09-05 NOTE — Progress Notes (Signed)
PROGRESS NOTE  Alicia Vang JXB:[id number] DOB: 11-Apr-1927   PCP: Eloisa Northern, MD  Patient is from: SNF  DOA: 08/22/2023 LOS: 14  Chief complaints Chief Complaint  Patient presents with   Aspiration     Brief Narrative / Interim history: 89 year old F with PMH of dementia, systolic CHF, infiltrative cardiomyopathy, CKD-3B, DM-2 and recent hospitalization for acute CHF and discharged to SNF returning with poor oral intake and dysphagia, and admitted for failure to thrive, dehydration, AKI, dysphagia and lactic acidosis.  Given recurrent hospitalization and poor prognosis, palliative medicine consulted and she was transition to full comfort care but remains on IV fluids per request by family members.  Family declined transfer to residential hospice.  Palliative medicine following.  Subjective: Seen and examined earlier this morning.  No major events overnight of this morning.  Patient's daughter at bedside.  Patient is resting and sleeping quietly.  No apparent distress.  Objective: Vitals:   09/02/23 0549 09/03/23 0630 09/04/23 0726 09/05/23 0615  BP: (!) 126/58 119/62 (!) 95/52 (!) 99/52  Pulse: 66 69 70 72  Resp: 12 16 (!) 22 16  Temp: 97.7 F (36.5 C) 98.4 F (36.9 C) 98.7 F (37.1 C) 99.9 F (37.7 C)  TempSrc: Oral Oral Oral Oral  SpO2: 99% (!) 82% 92% 91%  Weight:      Height:        Examination:  GENERAL: Resting and sleeping quietly.  No apparent distress. RESP:  No IWOB.  MSK/EXT: No apparent deformity. NEURO: Patient is resting and sleeping PSYCH: Calm.  No distress or agitation.  Procedures:  None  Microbiology summarized: MRSA PCR screen nonreactive  Assessment and plan: End-of-life care/full comfort care Adult failure to thrive, dehydration and patient with severe dementia: Transitioned to full comfort care but family likes to continue IV fluids. -Palliative medicine guiding care. -Family not interested in transfer to residential hospice.   Anticipate in-hospital death  AKI/lactic acidosis: Likely due to poor p.o. intake.  Infiltrative cardiomyopathy/combined CHF: TTE on 10/3 with LVEF of 25 to 30%, GH, severe concentric LVH, large pleural effusion and an LV myocardium consistent with infiltrative cardiomyopathy.  History of sinus bradycardia/Wenckebach/PACs: Based on previous evaluation by cardiology patient is not a candidate for aggressive care.     Aspiration pneumonia in the setting of dysphagia present on admission: -Completed course of antibiotics.  Remains at high risk for aspiration.   Hypotension   DM-2 with hyperglycemia   Severe malnutrition/adult failure to thrive Body mass index is 14.23 kg/m.  Pressure skin injury: POA Pressure Injury 08/23/23 Sacrum Mid;Medial Deep Tissue Pressure Injury - Purple or maroon localized area of discolored intact skin or blood-filled blister due to damage of underlying soft tissue from pressure and/or shear. red, purple (Active)  08/23/23 1716  Location: Sacrum  Location Orientation: Mid;Medial  Staging: Deep Tissue Pressure Injury - Purple or maroon localized area of discolored intact skin or blood-filled blister due to damage of underlying soft tissue from pressure and/or shear.  Wound Description (Comments): red, purple  Present on Admission: Yes  Dressing Type Foam - Lift dressing to assess site every shift 09/05/23 1000     Pressure Injury 08/23/23 Ankle Posterior;Right;Lateral Stage 2 -  Partial thickness loss of dermis presenting as a shallow open injury with a red, pink wound bed without slough. red (Active)  08/23/23 1716  Location: Ankle  Location Orientation: Posterior;Right;Lateral  Staging: Stage 2 -  Partial thickness loss of dermis presenting as a shallow open injury with  a red, pink wound bed without slough.  Wound Description (Comments): red  Present on Admission: Yes  Dressing Type Foam - Lift dressing to assess site every shift 09/05/23 1000   DVT  prophylaxis:  Patient is full comfort.  Code Status: DNR/DNI Family Communication: Updated patient's daughter at bedside Level of care: Palliative Care Status is: Inpatient Remains inpatient appropriate because: End-of-life care   Final disposition: Anticipate in-hospital death Consultants:  Palliative medicine  35 minutes with more than 50% spent in reviewing records, counseling patient/family and coordinating care.   Sch Meds:  Scheduled Meds:  Chlorhexidine Gluconate Cloth  6 each Topical Daily   mouth rinse  15 mL Mouth Rinse 4 times per day   Continuous Infusions: PRN Meds:.acetaminophen, glycopyrrolate, HYDROmorphone (DILAUDID) injection, mouth rinse, polyethylene glycol, prochlorperazine  Antimicrobials: Anti-infectives (From admission, onward)    Start     Dose/Rate Route Frequency Ordered Stop   08/23/23 1900  ceFEPIme (MAXIPIME) 1 g in sodium chloride 0.9 % 100 mL IVPB  Status:  Discontinued        1 g 200 mL/hr over 30 Minutes Intravenous Every 24 hours 08/22/23 1938 08/24/23 1204   08/22/23 1645  ceFEPIme (MAXIPIME) 2 g in sodium chloride 0.9 % 100 mL IVPB        2 g 200 mL/hr over 30 Minutes Intravenous  Once 08/22/23 1629 08/22/23 1847   08/22/23 1630  vancomycin (VANCOCIN) IVPB 1000 mg/200 mL premix  Status:  Discontinued        1,000 mg 200 mL/hr over 60 Minutes Intravenous  Once 08/22/23 1624 08/22/23 1625   08/22/23 1630  vancomycin (VANCOREADY) IVPB 750 mg/150 mL        750 mg 150 mL/hr over 60 Minutes Intravenous  Once 08/22/23 1626 08/22/23 2015        I have personally reviewed the following labs and images: CBC: No results for input(s): "WBC", "NEUTROABS", "HGB", "HCT", "MCV", "PLT" in the last 168 hours. BMP &GFR No results for input(s): "NA", "K", "CL", "CO2", "GLUCOSE", "BUN", "CREATININE", "CALCIUM", "MG", "PHOS" in the last 168 hours.  Invalid input(s): "GFRCG" Estimated Creatinine Clearance: 13.3 mL/min (A) (by C-G formula based on SCr  of 1.67 mg/dL (H)). Liver & Pancreas: No results for input(s): "AST", "ALT", "ALKPHOS", "BILITOT", "PROT", "ALBUMIN" in the last 168 hours. No results for input(s): "LIPASE", "AMYLASE" in the last 168 hours. No results for input(s): "AMMONIA" in the last 168 hours. Diabetic: No results for input(s): "HGBA1C" in the last 72 hours. No results for input(s): "GLUCAP" in the last 168 hours. Cardiac Enzymes: No results for input(s): "CKTOTAL", "CKMB", "CKMBINDEX", "TROPONINI" in the last 168 hours. No results for input(s): "PROBNP" in the last 8760 hours. Coagulation Profile: No results for input(s): "INR", "PROTIME" in the last 168 hours. Thyroid Function Tests: No results for input(s): "TSH", "T4TOTAL", "FREET4", "T3FREE", "THYROIDAB" in the last 72 hours. Lipid Profile: No results for input(s): "CHOL", "HDL", "LDLCALC", "TRIG", "CHOLHDL", "LDLDIRECT" in the last 72 hours. Anemia Panel: No results for input(s): "VITAMINB12", "FOLATE", "FERRITIN", "TIBC", "IRON", "RETICCTPCT" in the last 72 hours. Urine analysis:    Component Value Date/Time   COLORURINE AMBER (A) 08/22/2023 1529   APPEARANCEUR CLOUDY (A) 08/22/2023 1529   LABSPEC 1.019 08/22/2023 1529   PHURINE 5.0 08/22/2023 1529   GLUCOSEU 50 (A) 08/22/2023 1529   HGBUR MODERATE (A) 08/22/2023 1529   BILIRUBINUR NEGATIVE 08/22/2023 1529   KETONESUR NEGATIVE 08/22/2023 1529   PROTEINUR 30 (A) 08/22/2023 1529   NITRITE NEGATIVE  08/22/2023 1529   LEUKOCYTESUR NEGATIVE 08/22/2023 1529   Sepsis Labs: Invalid input(s): "PROCALCITONIN", "LACTICIDVEN"  Microbiology: No results found for this or any previous visit (from the past 240 hour(s)).  Radiology Studies: No results found.    Windy Dudek T. Tobin Witucki [facility]  If 7PM-7AM, please contact night-coverage [email or web address] 09/05/2023, 12:16 PM

## 2023-09-05 NOTE — Plan of Care (Signed)
  Problem: Nutritional: Goal: Maintenance of adequate nutrition will improve Outcome: Not Progressing   Problem: Tissue Perfusion: Goal: Adequacy of tissue perfusion will improve Outcome: Not Progressing   Problem: Health Behavior/Discharge Planning: Goal: Ability to manage health-related needs will improve Outcome: Not Progressing   Problem: Activity: Goal: Risk for activity intolerance will decrease Outcome: Not Progressing   Problem: Nutrition: Goal: Adequate nutrition will be maintained Outcome: Not Progressing

## 2023-09-06 DIAGNOSIS — T17908S Unspecified foreign body in respiratory tract, part unspecified causing other injury, sequela: Secondary | ICD-10-CM | POA: Diagnosis not present

## 2023-09-06 DIAGNOSIS — R627 Adult failure to thrive: Secondary | ICD-10-CM | POA: Diagnosis not present

## 2023-09-06 DIAGNOSIS — Z515 Encounter for palliative care: Secondary | ICD-10-CM | POA: Diagnosis not present

## 2023-09-06 DIAGNOSIS — N179 Acute kidney failure, unspecified: Secondary | ICD-10-CM | POA: Diagnosis not present

## 2023-09-06 MED ORDER — SODIUM CHLORIDE 0.9 % IV SOLN: INTRAVENOUS | Status: AC

## 2023-09-06 NOTE — Plan of Care (Signed)
  Problem: Skin Integrity: Goal: Risk for impaired skin integrity will decrease Outcome: Progressing   Problem: Pain Management: Goal: General experience of comfort will improve Outcome: Progressing   Problem: Safety: Goal: Ability to remain free from injury will improve Outcome: Progressing

## 2023-09-06 NOTE — Progress Notes (Signed)
PROGRESS NOTE  Alicia Vang ZOX:[id number] DOB: Aug 23, 1927   PCP: Eloisa Northern, MD  Patient is from: SNF  DOA: 08/22/2023 LOS: 15  Chief complaints Chief Complaint  Patient presents with   Aspiration     Brief Narrative / Interim history: 89 year old F with PMH of dementia, systolic CHF, infiltrative cardiomyopathy, CKD-3B, DM-2 and recent hospitalization for acute CHF and discharged to SNF returning with poor oral intake and dysphagia, and admitted for failure to thrive, dehydration, AKI, dysphagia and lactic acidosis.  Given recurrent hospitalization and poor prognosis, palliative medicine consulted and she was transitioned to full comfort care but remained on IV fluids per request by family members.  Family declined transfer to residential hospice.  Palliative medicine following.  Subjective: Seen and examined earlier this morning.  No major events overnight of this morning.  Extensive discussion with patient's daughter at bedside.  Daughter stated that her mother was transition to full comfort care when she was hypotensive in ICU on 11/1.  After that, K blood pressure improved, and family decided to maintain hydration with IV fluid.  She also stated that family is "backpedaling" since then.  She believes that her mother's current quality of life is acceptable. She also admits that it is "more for the family".   Objective: Vitals:   09/03/23 0630 09/04/23 0726 09/05/23 0615 09/06/23 0527  BP: 119/62 (!) 95/52 (!) 99/52 130/87  Pulse: 69 70 72 73  Resp: 16 (!) 22 16 16   Temp: 98.4 F (36.9 C) 98.7 F (37.1 C) 99.9 F (37.7 C) 99.4 F (37.4 C)  TempSrc: Oral Oral Oral Oral  SpO2: (!) 82% 92% 91% 98%  Weight:      Height:        Examination:  GENERAL: Resting and sleeping quietly.  No apparent distress. RESP:  No IWOB.  MSK/EXT: No apparent deformity. NEURO: Patient is resting and sleeping PSYCH: Calm.  No distress or agitation.  Procedures:  None  Microbiology  summarized: MRSA PCR screen nonreactive  Assessment and plan: End-of-life care/full comfort care/Adult failure to thrive, dehydration and patient with severe dementia: transitioned to full comfort care but remained on IV fluid at family's request.  -Long discussion with patient's daughter at bedside this morning.  See above -Palliative medicine guiding care. -Family not interested in transfer to residential hospice.   Infiltrative cardiomyopathy/combined CHF: TTE on 10/3 with LVEF of 25 to 30%, GH, severe concentric LVH, large pleural effusion and an LV myocardium consistent with infiltrative cardiomyopathy.  History of sinus bradycardia/Wenckebach/PACs: Based on previous evaluation by cardiology patient is not a candidate for aggressive care.     Aspiration pneumonia in the setting of dysphagia present on admission: -Completed course of antibiotics.  Remains at high risk for aspiration.  AKI/lactic acidosis: Likely due to poor p.o. intake.  DM-2 with hyperglycemia  Hypotension  Severe malnutrition/adult failure to thrive Body mass index is 14.23 kg/m.  Pressure skin injury: POA Pressure Injury 08/23/23 Sacrum Mid;Medial Deep Tissue Pressure Injury - Purple or maroon localized area of discolored intact skin or blood-filled blister due to damage of underlying soft tissue from pressure and/or shear. red, purple (Active)  08/23/23 1716  Location: Sacrum  Location Orientation: Mid;Medial  Staging: Deep Tissue Pressure Injury - Purple or maroon localized area of discolored intact skin or blood-filled blister due to damage of underlying soft tissue from pressure and/or shear.  Wound Description (Comments): red, purple  Present on Admission: Yes  Dressing Type Foam - Lift dressing to assess  site every shift 09/06/23 0711     Pressure Injury 08/23/23 Ankle Posterior;Right;Lateral Stage 2 -  Partial thickness loss of dermis presenting as a shallow open injury with a red, pink wound bed  without slough. red (Active)  08/23/23 1716  Location: Ankle  Location Orientation: Posterior;Right;Lateral  Staging: Stage 2 -  Partial thickness loss of dermis presenting as a shallow open injury with a red, pink wound bed without slough.  Wound Description (Comments): red  Present on Admission: Yes  Dressing Type Foam - Lift dressing to assess site every shift 09/06/23 0711   DVT prophylaxis:  Patient is full comfort.  Code Status: DNR/DNI Family Communication: Updated patient's daughter at bedside Level of care: Palliative Care Status is: Inpatient Remains inpatient appropriate because: End-of-life care   Final disposition: Anticipate in-hospital death Consultants:  Palliative medicine  35 minutes with more than 50% spent in reviewing records, counseling patient/family and coordinating care.   Sch Meds:  Scheduled Meds:  Chlorhexidine Gluconate Cloth  6 each Topical Daily   mouth rinse  15 mL Mouth Rinse 4 times per day   Continuous Infusions:  sodium chloride 40 mL/hr at 09/05/23 1927   PRN Meds:.acetaminophen, glycopyrrolate, HYDROmorphone (DILAUDID) injection, morphine CONCENTRATE, mouth rinse, polyethylene glycol, prochlorperazine  Antimicrobials: Anti-infectives (From admission, onward)    Start     Dose/Rate Route Frequency Ordered Stop   08/23/23 1900  ceFEPIme (MAXIPIME) 1 g in sodium chloride 0.9 % 100 mL IVPB  Status:  Discontinued        1 g 200 mL/hr over 30 Minutes Intravenous Every 24 hours 08/22/23 1938 08/24/23 1204   08/22/23 1645  ceFEPIme (MAXIPIME) 2 g in sodium chloride 0.9 % 100 mL IVPB        2 g 200 mL/hr over 30 Minutes Intravenous  Once 08/22/23 1629 08/22/23 1847   08/22/23 1630  vancomycin (VANCOCIN) IVPB 1000 mg/200 mL premix  Status:  Discontinued        1,000 mg 200 mL/hr over 60 Minutes Intravenous  Once 08/22/23 1624 08/22/23 1625   08/22/23 1630  vancomycin (VANCOREADY) IVPB 750 mg/150 mL        750 mg 150 mL/hr over 60 Minutes  Intravenous  Once 08/22/23 1626 08/22/23 2015        I have personally reviewed the following labs and images: CBC: No results for input(s): "WBC", "NEUTROABS", "HGB", "HCT", "MCV", "PLT" in the last 168 hours. BMP &GFR No results for input(s): "NA", "K", "CL", "CO2", "GLUCOSE", "BUN", "CREATININE", "CALCIUM", "MG", "PHOS" in the last 168 hours.  Invalid input(s): "GFRCG" Estimated Creatinine Clearance: 13.3 mL/min (A) (by C-G formula based on SCr of 1.67 mg/dL (H)). Liver & Pancreas: No results for input(s): "AST", "ALT", "ALKPHOS", "BILITOT", "PROT", "ALBUMIN" in the last 168 hours. No results for input(s): "LIPASE", "AMYLASE" in the last 168 hours. No results for input(s): "AMMONIA" in the last 168 hours. Diabetic: No results for input(s): "HGBA1C" in the last 72 hours. No results for input(s): "GLUCAP" in the last 168 hours. Cardiac Enzymes: No results for input(s): "CKTOTAL", "CKMB", "CKMBINDEX", "TROPONINI" in the last 168 hours. No results for input(s): "PROBNP" in the last 8760 hours. Coagulation Profile: No results for input(s): "INR", "PROTIME" in the last 168 hours. Thyroid Function Tests: No results for input(s): "TSH", "T4TOTAL", "FREET4", "T3FREE", "THYROIDAB" in the last 72 hours. Lipid Profile: No results for input(s): "CHOL", "HDL", "LDLCALC", "TRIG", "CHOLHDL", "LDLDIRECT" in the last 72 hours. Anemia Panel: No results for input(s): "VITAMINB12", "FOLATE", "FERRITIN", "  TIBC", "IRON", "RETICCTPCT" in the last 72 hours. Urine analysis:    Component Value Date/Time   COLORURINE AMBER (A) 08/22/2023 1529   APPEARANCEUR CLOUDY (A) 08/22/2023 1529   LABSPEC 1.019 08/22/2023 1529   PHURINE 5.0 08/22/2023 1529   GLUCOSEU 50 (A) 08/22/2023 1529   HGBUR MODERATE (A) 08/22/2023 1529   BILIRUBINUR NEGATIVE 08/22/2023 1529   KETONESUR NEGATIVE 08/22/2023 1529   PROTEINUR 30 (A) 08/22/2023 1529   NITRITE NEGATIVE 08/22/2023 1529   LEUKOCYTESUR NEGATIVE 08/22/2023 1529    Sepsis Labs: Invalid input(s): "PROCALCITONIN", "LACTICIDVEN"  Microbiology: No results found for this or any previous visit (from the past 240 hour(s)).  Radiology Studies: No results found.    Jamonica Schoff T. Cristie Mckinney [facility]  If 7PM-7AM, please contact night-coverage [email or web address] 09/06/2023, 12:44 PM

## 2023-09-06 NOTE — Plan of Care (Signed)
  Problem: Clinical Measurements: Goal: Will remain free from infection Outcome: Progressing   Problem: Elimination: Goal: Will not experience complications related to urinary retention Outcome: Progressing   Problem: Pain Management: Goal: General experience of comfort will improve Outcome: Progressing   Problem: Safety: Goal: Ability to remain free from injury will improve Outcome: Progressing

## 2023-09-06 NOTE — Progress Notes (Signed)
Daily Progress Note   Patient Name: Alicia Vang       Date: 09/06/2023 DOB: [birth year]  Age: 89 y.o. MRN#: [id number] Attending Physician: Almon Hercules, MD Primary Care Physician: Eloisa Northern, MD Admit Date: 08/22/2023 Length of Stay: 15 days  Reason for Consultation/Follow-up: Establishing goals of care  Subjective:   CC: Patient remains unresponsive in bed. Following up regarding complex medical decision making.   Subjective:  Reviewed EMR prior to presenting to bedside.    Objective:   Vital Signs:  BP 130/87 (BP Location: Left Arm)   Pulse 73   Temp 99.4 F (37.4 C) (Oral)   Resp 16   Ht 5\' 6"  (1.676 m)   Wt 40 kg   SpO2 98%   BMI 14.23 kg/m   Physical Exam: General: Chronically ill-appearing, frail, cachectic, not responsive HENT: Dry mucous membranes Cardiovascular: RRR Respiratory: no increased work of breathing noted, not in respiratory distress Abdomen: not distended Extremities: Muscle wasting in all extremities  Imaging: I personally reviewed recent imaging.   Assessment & Plan:   Assessment: Patient is a 89 year old female with a past medical history of HFrEF 25-30%, sinus bradycardia, hypertension, CKD, diabetes, and cognitive impairment who was admitted on 08/22/2023 from skilled nursing facility with decreased responsiveness.  During hospitalization patient has received management for aspiration pneumonia in the setting of known dysphagia.  Hospital course complicated by electrolyte abnormalities, severe hypovolemic hypernatremia deemed secondary to dehydration and poor oral intake noted to have lactic acidosis and AKI.  Palliative medicine team consulted to assist with complex medical decision making.  Recommendations/Plan: # Complex medical decision making/goals of care:  - Patient unable to participate in complex medical decision-making due to underlying medical status.  -Many providers have discussed with all 4 children that patient is  at end of life. Family has decided NOT to pursue nasogastric/PEG tubes as would not change overall outcome and could instead cause harm by causing further aspiration in a rapid fashion on tube feeds or accumulation of fluid in lungs. Family has been wanting to continue with NaCl only IV fluids at this time. Have already stated "do not want hospice" so in hospital death anticipated.   -  Code Status: Limited: Do not attempt resuscitation (DNR) -DNR-LIMITED -Do Not Intubate/DNI   # Symptom management:  Pain Family mentioned great concern over use of morphine.   -Has  IV dilaudid 0.2mg  q3hrs prn for pain    Re discussed goals of care with daughter at bedside.  Less Than 2 Days Before Death In the final hours, patients exhibit specific clinical signs that indicate the approach of death.  Clinical Signs:  1.Death rattle  Management Techniques: Educate the family about the nature of the sound Reposition the patient to promote drainage Consider using anticholinergic medications if the patient is suffering.  2.Apnea. Management Techniques:  Educate the family about the possibility of irregular breathing patterns Administer opioids if dyspnea is present.  3.Respiration with mandibular movement  Management Techniques: Educate the family about changes in breathing patterns.  4.Decreased urine output  Management Techniques: Educate the family about the natural decrease in bodily functions.  5.Pulselessness of radial artery  Management Techniques: Educate the family about changes in circulation and perfusion.  6.Inability to close eyelids  Management Techniques: Educate the family about the physical changes in muscle control Use a wet washcloth if eyes are dry or irritated.  7.Grunting of vocal cords  Management Techniques: Educate the family the family about the possibility of  vocal cord tension Administer opioids if pain is present.  8.Fever  Management Techniques: Place a cool  washcloth on the patient's forehead Remove excess blankets Use a fan Administer acetominophin if necessary  # Psychosocial Support:  -one daughter, 3 sons  # Discharge Planning: Anticipated Hospital Death based on current assessment and discussion with family  Discussed with: Dr Alanda Slim and daughter.   Thank you for allowing the palliative care team to participate in the care Barb S Manna.  mod mdm Rosalin Hawking MD Palliative Care Provider PMT # [phone]  If patient remains symptomatic despite maximum doses, please call PMT at [phone] between 0700 and 1900. Outside of these hours, please call attending, as PMT does not have night coverage.  *Please note that this is a verbal dictation therefore any spelling or grammatical errors are due to the "[employer]agon [employer] One" system interpretation.

## 2023-09-07 DIAGNOSIS — I420 Dilated cardiomyopathy: Secondary | ICD-10-CM | POA: Diagnosis not present

## 2023-09-07 DIAGNOSIS — Z515 Encounter for palliative care: Secondary | ICD-10-CM | POA: Diagnosis not present

## 2023-09-07 DIAGNOSIS — I5042 Chronic combined systolic (congestive) and diastolic (congestive) heart failure: Secondary | ICD-10-CM

## 2023-09-07 DIAGNOSIS — N1831 Chronic kidney disease, stage 3a: Secondary | ICD-10-CM

## 2023-09-07 DIAGNOSIS — G20A1 Parkinson's disease without dyskinesia, without mention of fluctuations: Secondary | ICD-10-CM

## 2023-09-07 DIAGNOSIS — I10 Essential (primary) hypertension: Secondary | ICD-10-CM

## 2023-09-07 DIAGNOSIS — N179 Acute kidney failure, unspecified: Secondary | ICD-10-CM | POA: Diagnosis not present

## 2023-09-07 DIAGNOSIS — R627 Adult failure to thrive: Secondary | ICD-10-CM | POA: Diagnosis not present

## 2023-09-07 DIAGNOSIS — T17908S Unspecified foreign body in respiratory tract, part unspecified causing other injury, sequela: Secondary | ICD-10-CM | POA: Diagnosis not present

## 2023-09-07 MED ORDER — SCOPOLAMINE 1 MG/3DAYS TD PT72
1.0000 | MEDICATED_PATCH | TRANSDERMAL | Status: DC
  Administered 2023-09-07: 1.5 mg via TRANSDERMAL
  Filled 2023-09-07: qty 1

## 2023-09-07 MED ORDER — SODIUM CHLORIDE 0.9 % IV SOLN: INTRAVENOUS | Status: DC

## 2023-09-08 NOTE — Progress Notes (Signed)
   09/08/23 0010  Spiritual Encounters  Type of Visit Initial  Care provided to: Pt and family  Referral source Nurse (RN/NT/LPN)  Reason for visit Patient death  OnCall Visit No   Chaplain responded to a call from the patient's nurse the family requested a chaplain to meet with them after the patient, Alicia Vang, died. Her daughter, Alicia Vang, shared some of [name]'s life journey, she is the last of her siblings, all ten of them. Alicia Vang was a loved mother and aunt to many and she will be remembered for a very long time. We prayed for her peace and for comfort for those left behind.   [employer] [phone]

## 2023-09-11 DIAGNOSIS — R627 Adult failure to thrive: Secondary | ICD-10-CM | POA: Insufficient documentation

## 2023-09-23 NOTE — Plan of Care (Signed)
  Problem: Pain Management: Goal: General experience of comfort will improve Outcome: Progressing   Problem: Safety: Goal: Ability to remain free from injury will improve Outcome: Progressing

## 2023-09-23 NOTE — Death Summary Note (Signed)
DEATH SUMMARY   Patient Details  Name: Alicia Vang MRN: [id number] DOB: [birth year] VHQ:IONG, Jason Fila, MD Admission/Discharge Information   Admit Date:  September 09, 2023  Date of Death: Date of Death: 09/25/23  Time of Death: Time of Death: 10-03-44  Length of Stay: 09/26/23   Principle Cause of death: Adult failure to thrive  Hospital Diagnoses: Principal Problem:   Adult failure to thrive Active Problems:   Chronic combined systolic and diastolic CHF (congestive heart failure) (HCC)   Essential hypertension   Parkinson's disease (HCC)   Chronic kidney disease, stage 3a (HCC)   Dilated cardiomyopathy (HCC)   Palliative care encounter   AKI (acute kidney injury) (HCC)   Hypernatremia   Goals of care, counseling/discussion   Need for emotional support   Counseling and coordination of care   Aspiration into airway   End of life care   Failure to thrive in adult   Hospital Course: 89 year old F with PMH of dementia, systolic CHF, infiltrative cardiomyopathy, CKD-3B, DM-2 and recent hospitalization for acute CHF and discharged to SNF returning with poor oral intake and dysphagia, and admitted for failure to thrive, dehydration, AKI, dysphagia and lactic acidosis. Given recurrent hospitalization and poor prognosis, palliative medicine consulted and she was transitioned to full comfort care but remained on IV fluids per request by family members. Family declined transfer to residential hospice.  Eventually, patient passed away the night of 09-25-2023.          Procedures: None  Consultations: Palliative medicine  The results of significant diagnostics from this hospitalization (including imaging, microbiology, ancillary and laboratory) are listed below for reference.   Significant Diagnostic Studies: CT CHEST ABDOMEN PELVIS WO CONTRAST  Result Date: 09/09/2023 CLINICAL DATA:  Sepsis, possible aspiration.  Unresponsive. EXAM: CT CHEST, ABDOMEN AND PELVIS WITHOUT CONTRAST  TECHNIQUE: Multidetector CT imaging of the chest, abdomen and pelvis was performed following the standard protocol without IV contrast. RADIATION DOSE REDUCTION: This exam was performed according to the departmental dose-optimization program which includes automated exposure control, adjustment of the mA and/or kV according to patient size and/or use of iterative reconstruction technique. COMPARISON:  CT abdomen and pelvis 07/01/2022 FINDINGS: CT CHEST FINDINGS Cardiovascular: No significant vascular findings. Normal heart size. No pericardial effusion. Mediastinum/Nodes: No enlarged mediastinal, hilar, or axillary lymph nodes. Thyroid gland, trachea, and esophagus demonstrate no significant findings. Lungs/Pleura: There are calcified pleural plaques in both lung apices. Ill-defined patchy ground-glass and nodular ground glass opacities are seen in the right upper lobe, right middle lobe and right lower lobe. There is an air-fluid level in the right mainstem bronchus and occlusive plugging of bronchi throughout the right lower lobe. There is also some occlusive material within the left lower lobe bronchus centrally. There are patchy airspace opacities in the right lower lobe and minimally in the left lower lobe as well. Secretions are seen in the trachea. There is no pleural effusion or pneumothorax. Musculoskeletal: The bones are osteopenic. There are compression deformities of T5, T6 and T12 which are age indeterminate. CT ABDOMEN PELVIS FINDINGS Hepatobiliary: Hepatic cysts measuring up 2 3.4 cm appear unchanged. Gallbladder sludge is likely present. There is no biliary ductal dilatation allowing for lack of intravenous contrast. Pancreas: Not well defined on this noncontrast study. Grossly within normal limits. Spleen: Normal in size without focal abnormality. Adrenals/Urinary Tract: Adrenal glands are unremarkable. Kidneys are normal, without renal calculi, focal lesion, or hydronephrosis. The bladder is not  well seen secondary to streak artifact in the pelvis. There  are findings suspicious for large air-fluid level in the bladder. Stomach/Bowel: Limited evaluation for bowel pathology secondary to lack of oral and intravenous contrast. There is no evidence for bowel obstruction. No dilated bowel loops are seen. Colonic diverticula are present. The appendix is not well delineated on this study. Vascular/Lymphatic: There are atherosclerotic calcifications of the aorta. No definitive lymphadenopathy allowing for lack of intravenous contrast. Reproductive: Not well evaluated secondary to lack of contrast and streak artifact in the pelvis. Other: No abdominal wall hernia or abnormality. No abdominopelvic ascites. Musculoskeletal: The bones are osteopenic. Left hip arthroplasty is present. Right-sided hip screws are present. There is severe compression deformity of L3 and mild compression deformity of L1 similar to prior. IMPRESSION: 1. Air-fluid level in the right mainstem bronchus with occlusive plugging of bronchi throughout the right lower lobe. There is also some occlusive material in the left lower lobe bronchus. Findings are compatible with aspiration. 2. Patchy airspace opacities in the right lower lobe and minimally in the left lower lobe worrisome for pneumonia. 3. Ill-defined patchy ground-glass and nodular ground-glass opacities in the right lung worrisome for infection. 4. Calcified pleural plaques in both lung apices. 5. Colonic diverticulosis. 6. Large air-fluid level in the bladder worrisome for infection. 7. Multiple compression deformities in the thoracic spine, age indeterminate. Aortic Atherosclerosis (ICD10-I70.0). Electronically Signed   By: Darliss Cheney M.D.   On: 08/19/2023 19:02   CT HEAD WO CONTRAST  Result Date: 08/13/2023 CLINICAL DATA:  Altered mental status EXAM: CT HEAD WITHOUT CONTRAST TECHNIQUE: Contiguous axial images were obtained from the base of the skull through the vertex without  intravenous contrast. RADIATION DOSE REDUCTION: This exam was performed according to the departmental dose-optimization program which includes automated exposure control, adjustment of the mA and/or kV according to patient size and/or use of iterative reconstruction technique. COMPARISON:  07/01/2022 FINDINGS: Brain: No evidence of acute infarction, hemorrhage, mass, mass effect, or midline shift. No hydrocephalus or extra-axial fluid collection. Age related cerebral atrophy. Periventricular white matter changes, likely the sequela of chronic small vessel ischemic disease. Vascular: No hyperdense vessel. Skull: Negative for fracture or focal lesion. Sinuses/Orbits: No acute finding. Other: The mastoid air cells are well aerated. IMPRESSION: No acute intracranial process. Electronically Signed   By: Wiliam Ke M.D.   On: 08/02/2023 16:48   DG Chest Port 1 View  Result Date: 08/11/2023 CLINICAL DATA:  Altered level of consciousness. Possible aspiration. Unresponsive since noon. EXAM: PORTABLE CHEST 1 VIEW COMPARISON:  08/07/2023 FINDINGS: Heart size and pulmonary vascularity are normal. Emphysematous changes are suggested in the lungs. No airspace disease or consolidation. No pleural effusions. No pneumothorax. Mediastinal contours appear intact. Degenerative changes in the spine and shoulders. Compression deformity visualized at L1. Old healed left clavicular fracture. IMPRESSION: Emphysematous changes in the lungs. No evidence of active pulmonary disease. Electronically Signed   By: Burman Nieves M.D.   On: 08/08/2023 16:43    Microbiology: No results found for this or any previous visit (from the past 240 hour(s)).  Time spent: 35 minutes  Signed: Almon Hercules, MD 08/30/2023

## 2023-09-23 NOTE — Progress Notes (Signed)
Daily Progress Note   Patient Name: Alicia Vang       Date: 09/06/2023 DOB: 21-Mar-1927  Age: 89 y.o. MRN#: [id number] Attending Physician: Almon Hercules, MD Primary Care Physician: Eloisa Northern, MD Admit Date: 07/26/2023 Length of Stay: 16 days  Reason for Consultation/Follow-up: Establishing goals of care  Subjective:   CC: Patient remains unresponsive in bed. More rhonchorous breathing noted, eyes/face with sunken appearance, more UE edema noted today,following up regarding complex medical decision making.   Subjective:  Reviewed EMR prior to presenting to bedside.    Objective:   Vital Signs:  BP (!) 140/73 (BP Location: Right Arm)   Pulse 88   Temp (!) 101.4 F (38.6 C) (Oral)   Resp 16   Ht 5\' 6"  (1.676 m)   Wt 40 kg   SpO2 92%   BMI 14.23 kg/m   Physical Exam: General: Chronically ill-appearing, frail, cachectic, not responsive HENT: Dry mucous membranes Cardiovascular: RRR Respiratory: no increased work of breathing noted, not in respiratory distress Abdomen: not distended Extremities: Muscle wasting in all extremities  Imaging: I personally reviewed recent imaging.   Assessment & Plan:   Assessment: Patient is a 89 year old female with a past medical history of HFrEF 25-30%, sinus bradycardia, hypertension, CKD, diabetes, and cognitive impairment who was admitted on 08/16/2023 from skilled nursing facility with decreased responsiveness.  During hospitalization patient has received management for aspiration pneumonia in the setting of known dysphagia.  Hospital course complicated by electrolyte abnormalities, severe hypovolemic hypernatremia deemed secondary to dehydration and poor oral intake noted to have lactic acidosis and AKI.  Palliative medicine team consulted to assist with complex medical decision making.  Recommendations/Plan: # Complex medical decision making/goals of care:  - Patient unable to participate in complex medical decision-making  due to underlying medical status.  -Many providers have discussed with all 4 children that patient is at end of life. Family has decided NOT to pursue nasogastric/PEG tubes as would not change overall outcome and could instead cause harm by causing further aspiration in a rapid fashion on tube feeds or accumulation of fluid in lungs. Family has been wanting to continue with NaCl only IV fluids at this time. Have already stated "do not want hospice" so in hospital death anticipated.   -  Code Status: Limited: Do not attempt resuscitation (DNR) -DNR-LIMITED -Do Not Intubate/DNI   # Symptom management:  Pain Family mentioned great concern over use of morphine.   -Has  IV dilaudid 0.2mg  q3hrs prn for pain   Add scopolamine patch.  Re discussed goals of care with daughter at bedside.  Less Than 2 Days Before Death In the final hours, patients exhibit specific clinical signs that indicate the approach of death.  Clinical Signs:  1.Death rattle  Management Techniques: Educate the family about the nature of the sound Reposition the patient to promote drainage Consider using anticholinergic medications if the patient is suffering.  2.Apnea. Management Techniques:  Educate the family about the possibility of irregular breathing patterns Administer opioids if dyspnea is present.  3.Respiration with mandibular movement  Management Techniques: Educate the family about changes in breathing patterns.  4.Decreased urine output  Management Techniques: Educate the family about the natural decrease in bodily functions.  5.Pulselessness of radial artery  Management Techniques: Educate the family about changes in circulation and perfusion.  6.Inability to close eyelids  Management Techniques: Educate the family about the physical changes in muscle control Use a wet washcloth if eyes are dry or irritated.  7.Grunting of vocal cords  Management Techniques: Educate the family the family about the  possibility of vocal cord tension Administer opioids if pain is present.  8.Fever  Management Techniques: Place a cool washcloth on the patient's forehead Remove excess blankets Use a fan Administer acetominophin if necessary  # Psychosocial Support:  -one daughter, 3 sons  # Discharge Planning: Anticipated Hospital Death based on current assessment and discussion with family  Discussed with: Dr Alanda Slim and daughter.   Thank you for allowing the palliative care team to participate in the care Marquis S Varkey.  mod mdm Rosalin Hawking MD Palliative Care Provider PMT # [phone]  If patient remains symptomatic despite maximum doses, please call PMT at [phone] between 0700 and 1900. Outside of these hours, please call attending, as PMT does not have night coverage.  *Please note that this is a verbal dictation therefore any spelling or grammatical errors are due to the "[employer]agon [employer] One" system interpretation.

## 2023-09-23 NOTE — Progress Notes (Signed)
Patient expired at 2245 pm. 2 RN's Carollee Herter Amburn and myself) observed no RR, Pulse, and fixed pupils. Daughter at bedside. Patient was cleaned, foley and IV removed. Family remains at the bedside at this time. MD notified.Priscille Kluver

## 2023-09-23 NOTE — Progress Notes (Signed)
    Patient Name: Alicia Vang           DOB: Apr 30, 1927  MRN: [id number]       OVERNIGHT EVENT    Notified by RN that patient has expired at 2245.  2 RN verified. Patient was comfort care.   Family is at bedside.    Anthoney Harada, DNP, ACNPC- AG [facility]

## 2023-09-23 NOTE — Progress Notes (Signed)
PROGRESS NOTE  Alicia Vang ZOX:[id number] DOB: 02/16/1927   PCP: Eloisa Northern, MD  Patient is from: SNF  DOA: 08/23/2023 LOS: 16  Chief complaints Chief Complaint  Patient presents with   Aspiration     Brief Narrative / Interim history: 89 year old F with PMH of dementia, systolic CHF, infiltrative cardiomyopathy, CKD-3B, DM-2 and recent hospitalization for acute CHF and discharged to SNF returning with poor oral intake and dysphagia, and admitted for failure to thrive, dehydration, AKI, dysphagia and lactic acidosis.  Given recurrent hospitalization and poor prognosis, palliative medicine consulted and she was transitioned to full comfort care but remained on IV fluids per request by family members.  Family declined transfer to residential hospice.  Palliative medicine following.  Subjective: Seen and examined earlier this morning.  Fever to 101.4 this morning.  Daughter noted some swelling in her hands.  Seems to have some work of breathing.  Discussed with patient's daughter at bedside that these are signs that patient is approaching death.  Recommended judicious use of Roxanol and Dilaudid to elevate distress.  Objective: Vitals:   09/04/23 0726 09/05/23 0615 09/06/23 0527 09/13/2023 0533  BP: (!) 95/52 (!) 99/52 130/87 (!) 140/73  Pulse: 70 72 73 88  Resp: (!) 22 16 16 16   Temp: 98.7 F (37.1 C) 99.9 F (37.7 C) 99.4 F (37.4 C) (!) 101.4 F (38.6 C)  TempSrc: Oral Oral Oral Oral  SpO2: 92% 91% 98% 92%  Weight:      Height:        Examination:  GENERAL: Sleeping.  Some work of breathing RESP: Notable work of breathing. MSK/EXT: No apparent deformity.  Some edema in hands. NEURO: Patient is somnolent. PSYCH: Calm.  No agitation.  Procedures:  None  Microbiology summarized: MRSA PCR screen nonreactive  Assessment and plan: End-of-life care/full comfort care/Adult failure to thrive, dehydration and patient with severe dementia: transitioned to full  comfort care but remained on IV fluid at family's request.  Patient with some work of breathing and fever likely approaching days.  -Palliative medicine guiding care. -Discussed with patient's daughter at bedside.  Encourage judicious use of Roxanol and IV Dilaudid to alleviate distress. -Family not interested in transfer to residential hospice.   Infiltrative cardiomyopathy/combined CHF: TTE on 10/3 with LVEF of 25 to 30%, GH, severe concentric LVH, large pleural effusion and an LV myocardium consistent with infiltrative cardiomyopathy.  History of sinus bradycardia/Wenckebach/PACs: Based on previous evaluation by cardiology patient is not a candidate for aggressive care.     Aspiration pneumonia in the setting of dysphagia present on admission: -Completed course of antibiotics.  Remains at high risk for aspiration.  AKI/lactic acidosis: Likely due to poor p.o. intake.  DM-2 with hyperglycemia  Hypotension  Hypernatremia/hypokalemia  Pancytopenia  Severe malnutrition/adult failure to thrive Body mass index is 14.23 kg/m.  Pressure skin injury: POA Pressure Injury 08/23/23 Sacrum Mid;Medial Deep Tissue Pressure Injury - Purple or maroon localized area of discolored intact skin or blood-filled blister due to damage of underlying soft tissue from pressure and/or shear. red, purple (Active)  08/23/23 1716  Location: Sacrum  Location Orientation: Mid;Medial  Staging: Deep Tissue Pressure Injury - Purple or maroon localized area of discolored intact skin or blood-filled blister due to damage of underlying soft tissue from pressure and/or shear.  Wound Description (Comments): red, purple  Present on Admission: Yes  Dressing Type Foam - Lift dressing to assess site every shift 09/06/23 1920     Pressure Injury 08/23/23 Ankle Posterior;Right;Lateral  Stage 2 -  Partial thickness loss of dermis presenting as a shallow open injury with a red, pink wound bed without slough. red (Active)   08/23/23 1716  Location: Ankle  Location Orientation: Posterior;Right;Lateral  Staging: Stage 2 -  Partial thickness loss of dermis presenting as a shallow open injury with a red, pink wound bed without slough.  Wound Description (Comments): red  Present on Admission: Yes  Dressing Type Foam - Lift dressing to assess site every shift 09/06/23 1920   DVT prophylaxis:  Patient is full comfort.  Code Status: DNR/DNI Family Communication: Updated patient's daughter at bedside Level of care: Palliative Care Status is: Inpatient Remains inpatient appropriate because: End-of-life care   Final disposition: Anticipate in-hospital death Consultants:  Palliative medicine  35 minutes with more than 50% spent in reviewing records, counseling patient/family and coordinating care.   Sch Meds:  Scheduled Meds:  Chlorhexidine Gluconate Cloth  6 each Topical Daily   mouth rinse  15 mL Mouth Rinse 4 times per day   Continuous Infusions:   PRN Meds:.acetaminophen, glycopyrrolate, HYDROmorphone (DILAUDID) injection, morphine CONCENTRATE, mouth rinse, polyethylene glycol, prochlorperazine  Antimicrobials: Anti-infectives (From admission, onward)    Start     Dose/Rate Route Frequency Ordered Stop   08/23/23 1900  ceFEPIme (MAXIPIME) 1 g in sodium chloride 0.9 % 100 mL IVPB  Status:  Discontinued        1 g 200 mL/hr over 30 Minutes Intravenous Every 24 hours 07/27/2023 1938 08/24/23 1204   08/06/2023 1645  ceFEPIme (MAXIPIME) 2 g in sodium chloride 0.9 % 100 mL IVPB        2 g 200 mL/hr over 30 Minutes Intravenous  Once 08/15/2023 1629 08/10/2023 1847   08/12/2023 1630  vancomycin (VANCOCIN) IVPB 1000 mg/200 mL premix  Status:  Discontinued        1,000 mg 200 mL/hr over 60 Minutes Intravenous  Once 07/24/2023 1624 08/09/2023 1625   08/17/2023 1630  vancomycin (VANCOREADY) IVPB 750 mg/150 mL        750 mg 150 mL/hr over 60 Minutes Intravenous  Once 08/01/2023 1626 08/13/2023 2015        I have  personally reviewed the following labs and images: CBC: No results for input(s): "WBC", "NEUTROABS", "HGB", "HCT", "MCV", "PLT" in the last 168 hours. BMP &GFR No results for input(s): "NA", "K", "CL", "CO2", "GLUCOSE", "BUN", "CREATININE", "CALCIUM", "MG", "PHOS" in the last 168 hours.  Invalid input(s): "GFRCG" Estimated Creatinine Clearance: 13.3 mL/min (A) (by C-G formula based on SCr of 1.67 mg/dL (H)). Liver & Pancreas: No results for input(s): "AST", "ALT", "ALKPHOS", "BILITOT", "PROT", "ALBUMIN" in the last 168 hours. No results for input(s): "LIPASE", "AMYLASE" in the last 168 hours. No results for input(s): "AMMONIA" in the last 168 hours. Diabetic: No results for input(s): "HGBA1C" in the last 72 hours. No results for input(s): "GLUCAP" in the last 168 hours. Cardiac Enzymes: No results for input(s): "CKTOTAL", "CKMB", "CKMBINDEX", "TROPONINI" in the last 168 hours. No results for input(s): "PROBNP" in the last 8760 hours. Coagulation Profile: No results for input(s): "INR", "PROTIME" in the last 168 hours. Thyroid Function Tests: No results for input(s): "TSH", "T4TOTAL", "FREET4", "T3FREE", "THYROIDAB" in the last 72 hours. Lipid Profile: No results for input(s): "CHOL", "HDL", "LDLCALC", "TRIG", "CHOLHDL", "LDLDIRECT" in the last 72 hours. Anemia Panel: No results for input(s): "VITAMINB12", "FOLATE", "FERRITIN", "TIBC", "IRON", "RETICCTPCT" in the last 72 hours. Urine analysis:    Component Value Date/Time   COLORURINE AMBER (A) 07/31/2023  1529   APPEARANCEUR CLOUDY (A) 08/11/2023 1529   LABSPEC 1.019 08/16/2023 1529   PHURINE 5.0 08/09/2023 1529   GLUCOSEU 50 (A) 08/18/2023 1529   HGBUR MODERATE (A) 08/16/2023 1529   BILIRUBINUR NEGATIVE 07/24/2023 1529   KETONESUR NEGATIVE 08/21/2023 1529   PROTEINUR 30 (A) 07/26/2023 1529   NITRITE NEGATIVE 08/13/2023 1529   LEUKOCYTESUR NEGATIVE 08/23/2023 1529   Sepsis Labs: Invalid input(s): "PROCALCITONIN",  "LACTICIDVEN"  Microbiology: No results found for this or any previous visit (from the past 240 hour(s)).  Radiology Studies: No results found.    Alicia Vang T. Cordarrell Sane [facility]  If 7PM-7AM, please contact night-coverage [email or web address] 08/26/2023, 12:48 PM

## 2023-09-23 DEATH — deceased
